# Patient Record
Sex: Female | Born: 1940 | Race: White | Hispanic: No | Marital: Married | Smoking: Never smoker
Health system: Southern US, Community
[De-identification: ages and names within clinical notes are randomized; demographics above are authoritative.]

## PROBLEM LIST (undated history)

## (undated) DIAGNOSIS — I2699 Other pulmonary embolism without acute cor pulmonale: Secondary | ICD-10-CM

## (undated) DIAGNOSIS — E119 Type 2 diabetes mellitus without complications: Secondary | ICD-10-CM

## (undated) DIAGNOSIS — F419 Anxiety disorder, unspecified: Secondary | ICD-10-CM

## (undated) DIAGNOSIS — I5032 Chronic diastolic (congestive) heart failure: Secondary | ICD-10-CM

## (undated) DIAGNOSIS — I82409 Acute embolism and thrombosis of unspecified deep veins of unspecified lower extremity: Secondary | ICD-10-CM

## (undated) DIAGNOSIS — I10 Essential (primary) hypertension: Secondary | ICD-10-CM

## (undated) DIAGNOSIS — E785 Hyperlipidemia, unspecified: Secondary | ICD-10-CM

## (undated) DIAGNOSIS — J449 Chronic obstructive pulmonary disease, unspecified: Secondary | ICD-10-CM

## (undated) HISTORY — PX: NO PAST SURGERIES: SHX2092

---

## 2006-07-03 ENCOUNTER — Ambulatory Visit: Payer: Self-pay | Admitting: *Deleted

## 2006-07-14 ENCOUNTER — Ambulatory Visit: Payer: Self-pay | Admitting: *Deleted

## 2007-01-06 ENCOUNTER — Ambulatory Visit: Payer: Self-pay | Admitting: *Deleted

## 2007-01-14 ENCOUNTER — Ambulatory Visit: Payer: Self-pay | Admitting: *Deleted

## 2007-07-06 ENCOUNTER — Ambulatory Visit: Payer: Self-pay | Admitting: *Deleted

## 2007-11-20 ENCOUNTER — Ambulatory Visit: Payer: Self-pay | Admitting: *Deleted

## 2008-02-08 ENCOUNTER — Ambulatory Visit: Payer: Self-pay | Admitting: *Deleted

## 2008-07-27 ENCOUNTER — Ambulatory Visit: Payer: Self-pay | Admitting: Family Medicine

## 2009-07-31 ENCOUNTER — Ambulatory Visit: Payer: Self-pay | Admitting: Family Medicine

## 2010-12-04 ENCOUNTER — Ambulatory Visit: Payer: Self-pay | Admitting: Family Medicine

## 2012-02-25 ENCOUNTER — Ambulatory Visit: Payer: Self-pay | Admitting: Family Medicine

## 2013-04-20 ENCOUNTER — Ambulatory Visit: Payer: Self-pay | Admitting: Family Medicine

## 2014-06-21 ENCOUNTER — Ambulatory Visit: Payer: Self-pay | Admitting: Family Medicine

## 2014-07-06 ENCOUNTER — Ambulatory Visit: Payer: Self-pay

## 2014-11-22 ENCOUNTER — Ambulatory Visit: Payer: Self-pay | Admitting: Rheumatology

## 2016-09-04 ENCOUNTER — Other Ambulatory Visit: Payer: Self-pay | Admitting: Physician Assistant

## 2016-09-04 ENCOUNTER — Ambulatory Visit
Admission: RE | Admit: 2016-09-04 | Discharge: 2016-09-04 | Disposition: A | Discharge status: Home / Self Care | Payer: Medicare Other | Source: Ambulatory Visit | Attending: Physician Assistant | Admitting: Physician Assistant

## 2016-09-04 DIAGNOSIS — R0781 Pleurodynia: Secondary | ICD-10-CM | POA: Insufficient documentation

## 2016-09-04 DIAGNOSIS — M5134 Other intervertebral disc degeneration, thoracic region: Secondary | ICD-10-CM | POA: Diagnosis not present

## 2016-09-04 DIAGNOSIS — M25551 Pain in right hip: Secondary | ICD-10-CM

## 2016-09-04 DIAGNOSIS — M25559 Pain in unspecified hip: Secondary | ICD-10-CM | POA: Diagnosis not present

## 2021-01-06 ENCOUNTER — Emergency Department: Payer: Medicare Other

## 2021-01-06 ENCOUNTER — Other Ambulatory Visit: Payer: Self-pay

## 2021-01-06 ENCOUNTER — Inpatient Hospital Stay
Admission: EM | Admit: 2021-01-06 | Discharge: 2021-01-23 | DRG: 871 | Disposition: A | Discharge status: Skilled Nursing Facility | Payer: Medicare Other | Attending: Internal Medicine | Admitting: Internal Medicine

## 2021-01-06 ENCOUNTER — Observation Stay: Payer: Medicare Other

## 2021-01-06 DIAGNOSIS — U071 COVID-19: Secondary | ICD-10-CM

## 2021-01-06 DIAGNOSIS — M797 Fibromyalgia: Secondary | ICD-10-CM | POA: Diagnosis not present

## 2021-01-06 DIAGNOSIS — A4189 Other specified sepsis: Principal | ICD-10-CM | POA: Diagnosis present

## 2021-01-06 DIAGNOSIS — E876 Hypokalemia: Secondary | ICD-10-CM | POA: Diagnosis not present

## 2021-01-06 DIAGNOSIS — J80 Acute respiratory distress syndrome: Secondary | ICD-10-CM

## 2021-01-06 DIAGNOSIS — I82451 Acute embolism and thrombosis of right peroneal vein: Secondary | ICD-10-CM | POA: Diagnosis present

## 2021-01-06 DIAGNOSIS — I10 Essential (primary) hypertension: Secondary | ICD-10-CM | POA: Diagnosis not present

## 2021-01-06 DIAGNOSIS — I2699 Other pulmonary embolism without acute cor pulmonale: Secondary | ICD-10-CM

## 2021-01-06 DIAGNOSIS — Z6836 Body mass index (BMI) 36.0-36.9, adult: Secondary | ICD-10-CM

## 2021-01-06 DIAGNOSIS — T380X5A Adverse effect of glucocorticoids and synthetic analogues, initial encounter: Secondary | ICD-10-CM | POA: Diagnosis present

## 2021-01-06 DIAGNOSIS — E8770 Fluid overload, unspecified: Secondary | ICD-10-CM | POA: Diagnosis present

## 2021-01-06 DIAGNOSIS — E782 Mixed hyperlipidemia: Secondary | ICD-10-CM

## 2021-01-06 DIAGNOSIS — E785 Hyperlipidemia, unspecified: Secondary | ICD-10-CM | POA: Diagnosis present

## 2021-01-06 DIAGNOSIS — R54 Age-related physical debility: Secondary | ICD-10-CM | POA: Diagnosis present

## 2021-01-06 DIAGNOSIS — D6959 Other secondary thrombocytopenia: Secondary | ICD-10-CM | POA: Diagnosis present

## 2021-01-06 DIAGNOSIS — Z20822 Contact with and (suspected) exposure to covid-19: Secondary | ICD-10-CM | POA: Diagnosis present

## 2021-01-06 DIAGNOSIS — Z28310 Unvaccinated for covid-19: Secondary | ICD-10-CM

## 2021-01-06 DIAGNOSIS — E041 Nontoxic single thyroid nodule: Secondary | ICD-10-CM | POA: Diagnosis present

## 2021-01-06 DIAGNOSIS — Z0184 Encounter for antibody response examination: Secondary | ICD-10-CM

## 2021-01-06 DIAGNOSIS — I2694 Multiple subsegmental pulmonary emboli without acute cor pulmonale: Secondary | ICD-10-CM | POA: Diagnosis present

## 2021-01-06 DIAGNOSIS — I82431 Acute embolism and thrombosis of right popliteal vein: Secondary | ICD-10-CM | POA: Diagnosis present

## 2021-01-06 DIAGNOSIS — B370 Candidal stomatitis: Secondary | ICD-10-CM | POA: Diagnosis not present

## 2021-01-06 DIAGNOSIS — G8929 Other chronic pain: Secondary | ICD-10-CM | POA: Diagnosis present

## 2021-01-06 DIAGNOSIS — J9601 Acute respiratory failure with hypoxia: Secondary | ICD-10-CM

## 2021-01-06 DIAGNOSIS — R0602 Shortness of breath: Secondary | ICD-10-CM | POA: Diagnosis not present

## 2021-01-06 DIAGNOSIS — A419 Sepsis, unspecified organism: Secondary | ICD-10-CM | POA: Diagnosis present

## 2021-01-06 DIAGNOSIS — E441 Mild protein-calorie malnutrition: Secondary | ICD-10-CM | POA: Diagnosis present

## 2021-01-06 DIAGNOSIS — J189 Pneumonia, unspecified organism: Secondary | ICD-10-CM

## 2021-01-06 DIAGNOSIS — R6520 Severe sepsis without septic shock: Secondary | ICD-10-CM | POA: Diagnosis present

## 2021-01-06 DIAGNOSIS — I248 Other forms of acute ischemic heart disease: Secondary | ICD-10-CM | POA: Diagnosis present

## 2021-01-06 DIAGNOSIS — Z79899 Other long term (current) drug therapy: Secondary | ICD-10-CM

## 2021-01-06 DIAGNOSIS — J811 Chronic pulmonary edema: Secondary | ICD-10-CM | POA: Diagnosis present

## 2021-01-06 DIAGNOSIS — R918 Other nonspecific abnormal finding of lung field: Secondary | ICD-10-CM

## 2021-01-06 DIAGNOSIS — R5381 Other malaise: Secondary | ICD-10-CM | POA: Diagnosis present

## 2021-01-06 LAB — LACTIC ACID, PLASMA
Lactic Acid, Venous: 1.9 mmol/L (ref 0.5–1.9)
Lactic Acid, Venous: 2.2 mmol/L (ref 0.5–1.9)
Lactic Acid, Venous: 1.8 mmol/L (ref 0.5–1.9)

## 2021-01-06 LAB — PROTIME-INR
INR: 1.6 — ABNORMAL HIGH (ref 0.8–1.2)
Prothrombin Time: 18.6 seconds — ABNORMAL HIGH (ref 11.4–15.2)

## 2021-01-06 LAB — BASIC METABOLIC PANEL
Anion gap: 11 (ref 5–15)
BUN: 20 mg/dL (ref 8–23)
CO2: 25 mmol/L (ref 22–32)
Calcium: 7.9 mg/dL — ABNORMAL LOW (ref 8.9–10.3)
Chloride: 103 mmol/L (ref 98–111)
Creatinine, Ser: 0.89 mg/dL (ref 0.44–1.00)
GFR, Estimated: >60 mL/min (ref >60)
Glucose, Bld: 139 mg/dL — ABNORMAL HIGH (ref 70–99)
Potassium: 4 mmol/L (ref 3.5–5.1)
Sodium: 139 mmol/L (ref 135–145)

## 2021-01-06 LAB — TROPONIN I (HIGH SENSITIVITY)
Troponin I (High Sensitivity): 19 ng/L — ABNORMAL HIGH (ref <18)
Troponin I (High Sensitivity): 20 ng/L — ABNORMAL HIGH (ref <18)

## 2021-01-06 LAB — CBC WITH DIFFERENTIAL/PLATELET
Abs Immature Granulocytes: 0.58 10*3/uL — ABNORMAL HIGH (ref 0.00–0.07)
Basophils Absolute: 0.1 10*3/uL (ref 0.0–0.1)
Basophils Relative: 0 %
Eosinophils Absolute: 0.1 10*3/uL (ref 0.0–0.5)
Eosinophils Relative: 1 %
HCT: 49.8 % — ABNORMAL HIGH (ref 36.0–46.0)
Hemoglobin: 15.6 g/dL — ABNORMAL HIGH (ref 12.0–15.0)
Immature Granulocytes: 3 %
Lymphocytes Relative: 13 %
Lymphs Abs: 2.2 10*3/uL (ref 0.7–4.0)
MCH: 29.8 pg (ref 26.0–34.0)
MCHC: 31.3 g/dL (ref 30.0–36.0)
MCV: 95 fL (ref 80.0–100.0)
Monocytes Absolute: 4.4 10*3/uL — ABNORMAL HIGH (ref 0.1–1.0)
Monocytes Relative: 26 %
Neutro Abs: 9.9 10*3/uL — ABNORMAL HIGH (ref 1.7–7.7)
Neutrophils Relative %: 57 %
Platelets: 123 10*3/uL — ABNORMAL LOW (ref 150–400)
RBC: 5.24 MIL/uL — ABNORMAL HIGH (ref 3.87–5.11)
RDW: 18.9 % — ABNORMAL HIGH (ref 11.5–15.5)
Smear Review: NORMAL
WBC: 17.3 10*3/uL — ABNORMAL HIGH (ref 4.0–10.5)
nRBC: 0.1 % (ref 0.0–0.2)

## 2021-01-06 LAB — RESP PANEL BY RT-PCR (FLU A&B, COVID) ARPGX2
Influenza A by PCR: NEGATIVE
Influenza B by PCR: NEGATIVE
SARS Coronavirus 2 by RT PCR: NEGATIVE

## 2021-01-06 MED ORDER — ACETAMINOPHEN 325 MG PO TABS
650.0000 mg | ORAL_TABLET | Freq: Four times a day (QID) | ORAL | Status: DC | PRN
  Administered 2021-01-08 – 2021-01-18 (×4): 650 mg via ORAL
  Filled 2021-01-06 (×5): qty 2

## 2021-01-06 MED ORDER — TRAMADOL HCL 50 MG PO TABS
50.0000 mg | ORAL_TABLET | Freq: Three times a day (TID) | ORAL | Status: AC | PRN
  Administered 2021-01-07 – 2021-01-08 (×3): 50 mg via ORAL
  Filled 2021-01-06 (×3): qty 1

## 2021-01-06 MED ORDER — ENOXAPARIN SODIUM 60 MG/0.6ML ~~LOC~~ SOLN
0.5000 mg/kg | SUBCUTANEOUS | Status: DC
  Administered 2021-01-06: 50 mg via SUBCUTANEOUS

## 2021-01-06 MED ORDER — HEPARIN (PORCINE) 25000 UT/250ML-% IV SOLN: 1400.0000 [IU]/h | INTRAVENOUS | Status: DC

## 2021-01-06 MED ORDER — HEPARIN (PORCINE) 25000 UT/250ML-% IV SOLN
1450.0000 [IU]/h | INTRAVENOUS | Status: DC
  Administered 2021-01-07: 1400 [IU]/h via INTRAVENOUS
  Administered 2021-01-08: 1550 [IU]/h via INTRAVENOUS
  Administered 2021-01-09: 1450 [IU]/h via INTRAVENOUS
  Filled 2021-01-06 (×4): qty 250

## 2021-01-06 MED ORDER — IOHEXOL 350 MG/ML SOLN
75.0000 mL | Freq: Once | INTRAVENOUS | Status: AC | PRN
  Administered 2021-01-06: 75 mL via INTRAVENOUS

## 2021-01-06 MED ORDER — ENOXAPARIN SODIUM 60 MG/0.6ML ~~LOC~~ SOLN: 50.0000 mg | SUBCUTANEOUS | Status: DC

## 2021-01-06 MED ORDER — SODIUM CHLORIDE 0.9 % IV SOLN: INTRAVENOUS | Status: DC

## 2021-01-06 MED ORDER — GABAPENTIN 300 MG PO CAPS
300.0000 mg | ORAL_CAPSULE | Freq: Three times a day (TID) | ORAL | Status: DC
  Administered 2021-01-06 – 2021-01-17 (×33): 300 mg via ORAL
  Filled 2021-01-06 (×33): qty 1

## 2021-01-06 MED ORDER — ONDANSETRON HCL 4 MG/2ML IJ SOLN: 4.0000 mg | Freq: Four times a day (QID) | INTRAMUSCULAR | Status: DC | PRN

## 2021-01-06 MED ORDER — HYDRALAZINE HCL 25 MG PO TABS: 25.0000 mg | ORAL_TABLET | Freq: Three times a day (TID) | ORAL | Status: AC | PRN

## 2021-01-06 MED ORDER — SODIUM CHLORIDE 0.9 % IV SOLN
500.0000 mg | INTRAVENOUS | Status: AC
  Administered 2021-01-07 – 2021-01-12 (×6): 500 mg via INTRAVENOUS
  Filled 2021-01-06 (×7): qty 500

## 2021-01-06 MED ORDER — SODIUM CHLORIDE 0.9 % IV SOLN
2.0000 g | INTRAVENOUS | Status: DC
  Filled 2021-01-06: qty 20

## 2021-01-06 MED ORDER — SODIUM CHLORIDE 0.9 % IV SOLN
500.0000 mg | Freq: Once | INTRAVENOUS | Status: AC
  Administered 2021-01-06: 500 mg via INTRAVENOUS
  Filled 2021-01-06: qty 500

## 2021-01-06 MED ORDER — ONDANSETRON HCL 4 MG PO TABS: 4.0000 mg | ORAL_TABLET | Freq: Four times a day (QID) | ORAL | Status: DC | PRN

## 2021-01-06 MED ORDER — ACETAMINOPHEN 650 MG RE SUPP
650.0000 mg | Freq: Four times a day (QID) | RECTAL | Status: DC | PRN
Start: 2021-01-06 — End: 2021-01-23

## 2021-01-06 MED ORDER — SODIUM CHLORIDE 0.9 % IV SOLN
2.0000 g | Freq: Once | INTRAVENOUS | Status: AC
  Administered 2021-01-06: 2 g via INTRAVENOUS
  Filled 2021-01-06: qty 20

## 2021-01-06 MED ORDER — PRAVASTATIN SODIUM 20 MG PO TABS
10.0000 mg | ORAL_TABLET | Freq: Every day | ORAL | Status: DC
  Administered 2021-01-07 – 2021-01-23 (×17): 10 mg via ORAL
  Filled 2021-01-06 (×17): qty 1

## 2021-01-06 NOTE — ED Provider Notes (Signed)
-----------------------------------------   4:35 PM on 01/06/2021 -----------------------------------------  I received verbal report from radiology that the patient has bilateral segmental and subsegmental PE.  I informed the hospitalist Dr. Sedalia Muta who is taking care of the patient via secure chat.   Dionne Bucy, MD 01/06/21 [phone]

## 2021-01-06 NOTE — H&P (Signed)
History and Physical   Elizabeth Rowland JKK:[id number] DOB: 1941/05/22 DOA: 01/06/2021  PCP: Leanna Sato, MD  Outpatient Specialists: Dr. Gavin Potters, rheumatology Patient coming from: Home  I have personally briefly reviewed patient's old medical records in [facility] EMR.  Chief Concern: Shortness of breath  HPI: Elizabeth Rowland is a 80 y.o. female with medical history significant for fibromyalgia, history of hypertension, history of hyperlipidemia, currently on tramadol and gabapentin for chronic pain and fibromyalgia, no longer taking antihypertensives for pravastatin, presents to the emergency department for chief concerns of shortness of breath.  She reports that she has been having worsening shortness of breath for 3 weeks, cough that is productive of light yellow phlegm. She denies fever, vomtiing, chest pain, abdominal pain, diarrhea. She endorses dysuria 3 weeks ago.  She denies sick contacts.  At bedside, patient was able to tell me her name, age, location of hospital, and the current calendar year is 2022.  Patient was on 2 L nasal cannula at bedside and satting at 83 to 87%.  I increased it to 4 L and patient continued to sat at 85%.  I increased it to 10 L with some improvement.  Social history: lives with son. She denies tobacco, etoh, and recreational drug use. Formerly, she used to work in a [employer].  Vaccinations: she is not vaccinated for covid 19   ROS: Constitutional: no weight change, no fever ENT/Mouth: no sore throat, no rhinorrhea Eyes: no eye pain, no vision changes Cardiovascular: no chest pain, + dyspnea,  no edema, no palpitations Respiratory: + cough, + sputum, no wheezing Gastrointestinal: no nausea, no vomiting, no diarrhea, no constipation Genitourinary: no urinary incontinence, no dysuria, no hematuria Musculoskeletal: no arthralgias, no myalgias Skin: no skin lesions, no pruritus, Neuro: + weakness, no loss of consciousness, no  syncope Psych: no anxiety, no depression, + decrease appetite Heme/Lymph: no bruising, no bleeding  ED Course: Discussed with ED provider, patient requiring hospitalization for pneumonia.  Vitals in the emergency department was remarkable for temperature of 98.1, respiration rate 22, heart rate 89, blood pressure 167/89, SPO2 of 93% on 10 L nasal cannula.  Labs in the emergency department was remarkable for WBC of 17.3, hemoglobin 15.6, platelets 123, sodium 139, potassium 4.0, chloride 103, bicarb 25, BUN 20, serum creatinine of 0.89, nonfasting blood glucose 139.  Troponin is initially 19 and increased to 20.  COVID is negative.  Status post azithromycin and ceftriaxone IV per ED provider.  Assessment/Plan  Active Problems:   Severe sepsis with acute organ dysfunction (HCC)   Hyperlipidemia   Fibromyalgia   Essential hypertension   # Acute hypoxic respiratory failure secondary to pneumonia # Meets severe sepsis criteria Bilateral upper lobe pulmonary embolism - Increased respiration rate, hypoxia, leukocytosis, pneumonia - RT communication to place patient on nonrebreather - Patient is maintaining MAP, no indication for IV sepsis bolus at this time - Serum creatinine is 0.89, encourage p.o. intake - Status post azithromycin and ceftriaxone per EDP - Heparin per pharmacy, echo to assess heart function, bilateral US to assess for DVT, vascular surgeon consulted, Dr. Keitha Butte via secure chat and he will see the patient.   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   DVT prophylaxis: Enoxaparin 50 mg subcutaneous, every 24 hours Code Status: Full code Diet:/Carb modified Family Communication: Updated son, Mr. Elizabeth Rowland at bedside Disposition Plan: pending clinical course Consults called: None at this time Admission status: Observation, progressive cardiac, with telemetry  History reviewed. No pertinent past medical history.  History reviewed. No pertinent surgical history.  Social History:  reports that she has never smoked. She has never used smokeless tobacco. She reports that she does not drink alcohol and does not use drugs.  Not on File History reviewed. No pertinent family history. Family history: Family history reviewed and not pertinent  Prior to Admission medications   Not on File   Physical Exam: Vitals:   01/06/21 1304 01/06/21 1401 01/06/21 1432 01/06/21 1457  BP: 135/85 (!) 175/89 (!) 170/87 (!) 167/89  Pulse: 94 92 78 90  Resp: 17 (!) 30 (!) 30 (!) 27  Temp: 98.1 F (36.7 C)     TempSrc: Oral     SpO2: 91% 99% 96% 93%  Weight:      Height:       Constitutional: appears age-appropriate, poor personal hygiene, NAD, calm, comfortable Eyes: PERRL, lids and conjunctivae normal ENMT: Mucous membranes are dry. Posterior pharynx clear of any exudate or lesions.  Poor dentition.  Mild hearing loss Neck: normal, supple, no masses, no thyromegaly Respiratory: clear to auscultation bilaterally, no wheezing, no crackles. Normal respiratory effort. No accessory muscle use.  Cardiovascular: Regular rate and rhythm, no murmurs / rubs / gallops. No extremity edema. 2+ pedal pulses. No carotid bruits.  Abdomen: no tenderness, no masses palpated, no  hepatosplenomegaly. Bowel sounds positive.  Musculoskeletal: no clubbing / cyanosis. No joint deformity upper and lower extremities. Good ROM, no contractures, no atrophy. Normal muscle tone.  Skin: no rashes, lesions, ulcers. No induration Neurologic: Sensation intact. Strength 5/5 in all 4.  Psychiatric: Normal judgment and insight. Alert and oriented x 3. Normal mood.   EKG: independently reviewed, showing sinus rhythm rate of 92, qtc 485  Chest x-ray on Admission: I personally reviewed and I agree with radiologist reading as below.  DG Chest Portable 1 View  Result Date: 01/06/2021 CLINICAL DATA:  Shortness of breath EXAM: PORTABLE CHEST 1 VIEW COMPARISON:  07/06/2014 FINDINGS: Heart size appears within normal limits. Rounded mass-like opacity within the right mid to lower lung field measuring approximately 6.4 cm in diameter. Diffuse interstitial opacities throughout both lungs. Small right pleural effusion. No pneumothorax. IMPRESSION: 1. Rounded mass-like opacity within the right mid to lower lung field measuring approximately 6.4 cm in diameter. Further evaluation with contrast-enhanced CT of the chest is recommended. 2. Extensive bilateral interstitial opacities which may reflect pulmonary edema or possibly atypical/viral infection. 3. Small right pleural effusion. Electronically Signed   By: Duanne Guess D.O.   On: 01/06/2021 14:12   Labs on Admission: I have personally reviewed following labs  CBC: Recent Labs  Lab 01/06/21 1305  WBC 17.3*  NEUTROABS 9.9*  HGB 15.6*  HCT 49.8*  MCV 95.0  PLT 123*   Basic Metabolic Panel: Recent Labs  Lab 01/06/21 1305  NA  139  K 4.0  CL 103  CO2 25  GLUCOSE 139*  BUN 20  CREATININE 0.89  CALCIUM 7.9*   GFR: Estimated Creatinine Clearance: 60.3 mL/min (by C-G formula based on SCr of 0.89 mg/dL).  CRITICAL CARE Performed by: Lovenia Kim  Total critical care time: 35 minutes  Critical care time was exclusive of separately  billable procedures and treating other patients.  Critical care was necessary to treat or prevent imminent or life-threatening deterioration. Hypoxic respiratory failure. Circulatory failure secondary to pulmonary embolism. Sepsis  Critical care was time spent personally by me on the following activities: development of treatment plan with patient and/or surrogate as well as nursing, discussions with consultants, evaluation of patient's response to treatment, examination of patient, obtaining history from patient or surrogate, ordering and performing treatments and interventions, ordering and review of laboratory studies, ordering and review of radiographic studies, pulse oximetry and re-evaluation of patient's condition.  Jonia Oakey N Artha Stavros D.O. [facility]  If 7PM-7AM, please contact overnight-coverage provider If 7AM-7PM, please contact day coverage provider [email or web address]  01/06/2021, 4:17 PM

## 2021-01-06 NOTE — Progress Notes (Signed)
Lab called to report Critical Lactic Value 2.2  MD Cox and Keitha Butte notified via secure chat.

## 2021-01-06 NOTE — ED Provider Notes (Signed)
[facility] Emergency Department Provider Note  ____________________________________________  Time seen: Approximately 3:07 PM  I have reviewed the triage vital signs and the nursing notes.   HISTORY  Chief Complaint Shortness of Breath (Loss of taiste x 2 weeks, sob, pt from home )    HPI Elizabeth Rowland is a 80 y.o. female with a past history of hyperlipidemia, GERD, hypertension, obesity  who comes the ED complaining of shortness of breath for the past [redacted] weeks along with loss of taste.  Denies chest pain.  Shortness of breath has been persistent, worse with walking.  No alleviating factors.  Also has a productive cough.  Symptoms are constant.  No vomiting or diarrhea.  Decreased oral intake but overall feels like she is eating and drinking adequately.    Medications Reconcile with Patient's Chart  Medication Sig Dispensed Refills Start Date End Date Status  acetaminophen (TYLENOL) 500 mg capsule  Take 500 mg by mouth every 4 (four) hours as needed for Fever.  0   Active  naproxen (NAPROSYN) 500 MG tablet  Take 500 mg by mouth 2 (two) times daily with meals.  0   Active  pravastatin (PRAVACHOL) 10 MG tablet  Take 10 mg by mouth nightly.  0   Active  omeprazole (PRILOSEC) 20 MG DR capsule  Take 20 mg by mouth once daily.  0   Active  lisinopril-hydrochlorothiazide (PRINZIDE,ZESTORETIC) 20-25 mg tablet  Take 1 tablet by mouth once daily.  0   Active  nystatin (MYCOSTATIN) 100,000 unit/gram cream  Apply topically 2 (two) times daily.  0   Active  aspirin 81 MG EC tablet  Take 81 mg by mouth once daily.  0   Active  methocarbamol (ROBAXIN) 500 MG tablet  TAKE ONE TABLET BY MOUTH FOUR TIMES DAILY 40 tablet  2 11/29/2014  Active  traMADol (ULTRAM) 50 mg tablet  TAKE ONE TABLET BY MOUTH TWICE DAILY AS NEEDED FOR PAIN 60 tablet  0 01/18/2015  Active   Active Problems   Not on file  Medical History   Medical History Date  Comments  Fibromyalgia    Chronic pain    Psoriasis    Hyperlipemia    Anxiety    Fibromyalgia    Arthritis    GERD (gastroesophageal reflux disease)    Allergic state    Hypertension    Obesity      History reviewed. No pertinent past medical history.   There are no problems to display for this patient.    History reviewed. No pertinent surgical history.     Allergies Patient has no allergy information on record.   History reviewed. No pertinent family history.  Social History Social History   Tobacco Use  . Smoking status: Never Smoker  . Smokeless tobacco: Never Used  Substance Use Topics  . Alcohol use: Never  . Drug use: Never    Review of Systems  Constitutional:   No fever or chills.  ENT:   No sore throat. No rhinorrhea. Cardiovascular:   No chest pain or syncope. Respiratory: Positive shortness of breath and productive cough. Gastrointestinal:   Negative for abdominal pain, vomiting and diarrhea.  Musculoskeletal:   Negative for focal pain or swelling All other systems reviewed and are negative except as documented above in ROS and HPI.  ____________________________________________   PHYSICAL EXAM:  VITAL SIGNS: ED Triage Vitals  Enc Vitals Group     BP 01/06/21 1304 135/85     Pulse Rate  01/06/21 1304 94     Resp 01/06/21 1304 17     Temp 01/06/21 1304 98.1 F (36.7 C)     Temp Source 01/06/21 1304 Oral     SpO2 01/06/21 1304 91 %     Weight 01/06/21 1301 220 lb 7.4 oz (100 kg)     Height 01/06/21 1301 5\' 7"  (1.702 m)     Head Circumference --      Peak Flow --      Pain Score 01/06/21 1301 0     Pain Loc --      Pain Edu? --      Excl. in GC? --     Vital signs reviewed, nursing assessments reviewed.   Constitutional:   Alert and oriented. Non-toxic appearance. Eyes:   Conjunctivae are normal. EOMI. PERRL. ENT      Head:   Normocephalic and atraumatic.      Nose:   Wearing a mask.      Mouth/Throat:    Wearing a mask.      Neck:   No meningismus. Full ROM. Hematological/Lymphatic/Immunilogical:   No cervical lymphadenopathy. Cardiovascular:   RRR. Symmetric bilateral radial and DP pulses.  No murmurs. Cap refill less than 2 seconds. Respiratory:   Tachypnea with a respiratory rate of about 28.  Bilateral basilar crackles, diminished breath sounds on the right. Gastrointestinal:   Soft and nontender. Non distended. There is no CVA tenderness.  No rebound, rigidity, or guarding. Genitourinary:   deferred Musculoskeletal:   Normal range of motion in all extremities. No joint effusions.  No lower extremity tenderness.  No edema. Neurologic:   Normal speech and language.  Motor grossly intact. No acute focal neurologic deficits are appreciated.  Skin:    Skin is warm, dry and intact. No rash noted.  No petechiae, purpura, or bullae.  ____________________________________________    LABS (pertinent positives/negatives) (all labs ordered are listed, but only abnormal results are displayed) Labs Reviewed  BASIC METABOLIC PANEL - Abnormal; Notable for the following components:      Result Value   Glucose, Bld 139 (*)    Calcium 7.9 (*)    All other components within normal limits  CBC WITH DIFFERENTIAL/PLATELET - Abnormal; Notable for the following components:   WBC 17.3 (*)    RBC 5.24 (*)    Hemoglobin 15.6 (*)    HCT 49.8 (*)    RDW 18.9 (*)    Platelets 123 (*)    Neutro Abs 9.9 (*)    Monocytes Absolute 4.4 (*)    Abs Immature Granulocytes 0.58 (*)    All other components within normal limits  TROPONIN I (HIGH SENSITIVITY) - Abnormal; Notable for the following components:   Troponin I (High Sensitivity) 19 (*)    All other components within normal limits  RESP PANEL BY RT-PCR (FLU A&B, COVID) ARPGX2  CULTURE, BLOOD (ROUTINE X 2)  CULTURE, BLOOD (ROUTINE X 2)  LACTIC ACID, PLASMA  LACTIC ACID, PLASMA  POC SARS CORONAVIRUS 2 AG -  ED  TROPONIN I (HIGH SENSITIVITY)    ____________________________________________   EKG  Interpreted by me Sinus rhythm rate of 92, normal axis and intervals.  Normal QRS ST segments and T waves.  No acute ischemic changes.  ____________________________________________    RADIOLOGY  DG Chest Portable 1 View  Result Date: 01/06/2021 CLINICAL DATA:  Shortness of breath EXAM: PORTABLE CHEST 1 VIEW COMPARISON:  07/06/2014 FINDINGS: Heart size appears within normal limits. Rounded mass-like opacity within the  right mid to lower lung field measuring approximately 6.4 cm in diameter. Diffuse interstitial opacities throughout both lungs. Small right pleural effusion. No pneumothorax. IMPRESSION: 1. Rounded mass-like opacity within the right mid to lower lung field measuring approximately 6.4 cm in diameter. Further evaluation with contrast-enhanced CT of the chest is recommended. 2. Extensive bilateral interstitial opacities which may reflect pulmonary edema or possibly atypical/viral infection. 3. Small right pleural effusion. Electronically Signed   By: Duanne Guess D.O.   On: 01/06/2021 14:12    ____________________________________________   PROCEDURES Procedures  ____________________________________________  DIFFERENTIAL DIAGNOSIS   Pneumonia, viral illness, pleural effusion, pulmonary edema, anemia, dehydration, pulmonary embolism  CLINICAL IMPRESSION / ASSESSMENT AND PLAN / ED COURSE  Medications ordered in the ED: Medications  cefTRIAXone (ROCEPHIN) 2 g in sodium chloride 0.9 % 100 mL IVPB (has no administration in time range)  azithromycin (ZITHROMAX) 500 mg in sodium chloride 0.9 % 250 mL IVPB (has no administration in time range)  iohexol (OMNIPAQUE) 350 MG/ML injection 75 mL (has no administration in time range)    Pertinent labs & imaging results that were available during my care of the patient were reviewed by me and considered in my medical decision making (see chart for details).  Elizabeth Rothschild  Rowland was evaluated in Emergency Department on 01/06/2021 for the symptoms described in the history of present illness. She was evaluated in the context of the global COVID-19 pandemic, which necessitated consideration that the patient might be at risk for infection with the SARS-CoV-2 virus that causes COVID-19. Institutional protocols and algorithms that pertain to the evaluation of patients at risk for COVID-19 are in a state of rapid change based on information released by regulatory bodies including the CDC and federal and state organizations. These policies and algorithms were followed during the patient's care in the ED.   Patient presents with shortness of breath and cough.  Chest x-ray viewed and interpreted by me, concerning for a lung mass as well as possible pneumonia.  Radiology report confirms.  Hypoxic to 87% on room air.  Placed on nonrebreather for comfort on arrival, will wean to nasal cannula as able..  Will obtain CTA chest to evaluate for PE and further delineate possible mass in the chest.  We will also obtain abdomen pelvis CT for possible metastatic disease.  Plan to admit for hypoxic respiratory failure  COVID/flu negative     ____________________________________________   FINAL CLINICAL IMPRESSION(S) / ED DIAGNOSES    Final diagnoses:  Acute respiratory failure with hypoxia (HCC)  Community acquired pneumonia, unspecified laterality  Lung mass     ED Discharge Orders    None      Portions of this note were generated with dragon dictation software. Dictation errors may occur despite best attempts at proofreading.   Sharman Cheek, MD 01/06/21 [phone]

## 2021-01-06 NOTE — Progress Notes (Signed)
PHARMACIST - PHYSICIAN COMMUNICATION  CONCERNING:  Enoxaparin (Lovenox) for DVT Prophylaxis    RECOMMENDATION: Patient was prescribed enoxaprin 40mg  q24 hours for VTE prophylaxis.   Filed Weights   01/06/21 1301  Weight: 100 kg (220 lb 7.4 oz)    Body mass index is 34.53 kg/m.  Estimated Creatinine Clearance: 60.3 mL/min (by C-G formula based on SCr of 0.89 mg/dL).   Based on [facility] policy patient is candidate for enoxaparin 0.5mg /kg TBW SQ every 24 hours based on BMI being >30.  DESCRIPTION: Pharmacy has adjusted enoxaparin dose per [facility] policy.  Patient is now receiving enoxaparin 50 mg every 24 hours    [facility], PharmD Pharmacy Resident  01/06/2021 3:31 PM

## 2021-01-06 NOTE — Consult Note (Signed)
Reason for Consult: Bilateral pulmonary emboli Referring Physician: Arraya Buck is an 80 y.o. female.  HPI: 80 year old woman who is admitted with bilateral pneumonia.  She had progressive shortness of breath over the past 3 to 4 weeks according to her family.  Patient in the emergency room had sats of 82 to 86% on 4 to 6 L currently on the floor she is 100% on 100% nonrebreather.  She is comfortable and not tachypneic.  The patient denies any chest pain.  Patient did not notify her family members of her worsening shortness of breath till recently.  Unsure whether this occurred suddenly or worsened over time.  CT scan was performed in the ER showing a small PE involving the right apical segment of the superior trunk on the right pulmonary artery, and to subsegmental PEs in the left upper lobe anterior segmental branch.  Diffuse groundglass opacities are seen throughout bilateral lungs consistent with pneumonia/pulmonary edema.  Has been admitted and started on Rocephin and azithromycin.  She ran.  She had primary care physician previously but none in the past 8 years.  No home medications for her dyslipidemia and hypertension at this time.  Echocardiogram and lower extremity DVT ultrasound are still pending.  Patient was previously ambulatory in the home, outside the home minimally because of balance issues.  Is seen in a pain clinic for her chronic fibromyalgia issues and is on chronic pain medications with tramadol and gabapentin.  Past medical history: Hypertension, dyslipidemia, fibromyalgia  History reviewed. No pertinent surgical history.  History reviewed. No pertinent family history.  Social History:  reports that she has never smoked. She has never used smokeless tobacco. She reports that she does not drink alcohol and does not use drugs.  Allergies: Not on File  Medications: I have reviewed the patient's current medications.  Results for orders placed or performed during  the hospital encounter of 01/06/21 (from the past 48 hour(s))  Resp Panel by RT-PCR (Flu A&B, Covid) Nasopharyngeal Swab     Status: None   Collection Time: 01/06/21  1:05 PM   Specimen: Nasopharyngeal Swab; Nasopharyngeal(NP) swabs in vial transport medium  Result Value Ref Range   SARS Coronavirus 2 by RT PCR NEGATIVE NEGATIVE    Comment: (NOTE) SARS-CoV-2 target nucleic acids are NOT DETECTED.  The SARS-CoV-2 RNA is generally detectable in upper respiratory specimens during the acute phase of infection. The lowest concentration of SARS-CoV-2 viral copies this assay can detect is 138 copies/mL. A negative result does not preclude SARS-Cov-2 infection and should not be used as the sole basis for treatment or other patient management decisions. A negative result may occur with  improper specimen collection/handling, submission of specimen other than nasopharyngeal swab, presence of viral mutation(s) within the areas targeted by this assay, and inadequate number of viral copies(<138 copies/mL). A negative result must be combined with clinical observations, patient history, and epidemiological information. The expected result is Negative.  Fact Sheet for Patients:  BloggerCourse.com  Fact Sheet for Healthcare Providers:  SeriousBroker.it  This test is no t yet approved or cleared by the [place] FDA and  has been authorized for detection and/or diagnosis of SARS-CoV-2 by FDA under an Emergency Use Authorization (EUA). This EUA will remain  in effect (meaning this test can be used) for the duration of the COVID-19 declaration under Section 564(b)(1) of the Act, 21 U.S.C.section 360bbb-3(b)(1), unless the authorization is terminated  or revoked sooner.       Influenza  A by PCR NEGATIVE NEGATIVE   Influenza B by PCR NEGATIVE NEGATIVE    Comment: (NOTE) The Xpert Xpress SARS-CoV-2/FLU/RSV plus assay is intended as an aid in the  diagnosis of influenza from Nasopharyngeal swab specimens and should not be used as a sole basis for treatment. Nasal washings and aspirates are unacceptable for Xpert Xpress SARS-CoV-2/FLU/RSV testing.  Fact Sheet for Patients: BloggerCourse.com  Fact Sheet for Healthcare Providers: SeriousBroker.it  This test is not yet approved or cleared by the [place] FDA and has been authorized for detection and/or diagnosis of SARS-CoV-2 by FDA under an Emergency Use Authorization (EUA). This EUA will remain in effect (meaning this test can be used) for the duration of the COVID-19 declaration under Section 564(b)(1) of the Act, 21 U.S.C. section 360bbb-3(b)(1), unless the authorization is terminated or revoked.  Performed at [facility], [address] [place], [place] [address]   Basic metabolic panel     Status: Abnormal   Collection Time: 01/06/21  1:05 PM  Result Value Ref Range   Sodium 139 135 - 145 mmol/L   Potassium 4.0 3.5 - 5.1 mmol/L   Chloride 103 98 - 111 mmol/L   CO2 25 22 - 32 mmol/L   Glucose, Bld 139 (H) 70 - 99 mg/dL    Comment: Glucose reference range applies only to samples taken after fasting for at least 8 hours.   BUN 20 8 - 23 mg/dL   Creatinine, Ser 8.31 0.44 - 1.00 mg/dL   Calcium 7.9 (L) 8.9 - 10.3 mg/dL   GFR, Estimated >51 >76 mL/min    Comment: (NOTE) Calculated using the CKD-EPI Creatinine Equation (2021)    Anion gap 11 5 - 15    Comment: Performed at [facility], [address] [place], [place] [address]  Troponin I (High Sensitivity)     Status: Abnormal   Collection Time: 01/06/21  1:05 PM  Result Value Ref Range   Troponin I (High Sensitivity) 19 (H) <18 ng/L    Comment: (NOTE) Elevated high sensitivity troponin I (hsTnI) values and significant  changes across serial measurements may suggest ACS but many other  chronic and acute conditions are known to elevate hsTnI  results.  Refer to the "Links" section for chest pain algorithms and additional  guidance. Performed at [facility], [address] [place], [place] [address]   CBC with Differential     Status: Abnormal   Collection Time: 01/06/21  1:05 PM  Result Value Ref Range   WBC 17.3 (H) 4.0 - 10.5 K/uL   RBC 5.24 (H) 3.87 - 5.11 MIL/uL   Hemoglobin 15.6 (H) 12.0 - 15.0 g/dL   HCT 69.4 (H) 85.4 - 62.7 %   MCV 95.0 80.0 - 100.0 fL   MCH 29.8 26.0 - 34.0 pg   MCHC 31.3 30.0 - 36.0 g/dL   RDW 03.5 (H) 00.9 - 38.1 %   Platelets 123 (L) 150 - 400 K/uL   nRBC 0.1 0.0 - 0.2 %   Neutrophils Relative % 57 %   Neutro Abs 9.9 (H) 1.7 - 7.7 K/uL   Lymphocytes Relative 13 %   Lymphs Abs 2.2 0.7 - 4.0 K/uL   Monocytes Relative 26 %   Monocytes Absolute 4.4 (H) 0.1 - 1.0 K/uL   Eosinophils Relative 1 %   Eosinophils Absolute 0.1 0.0 - 0.5 K/uL   Basophils Relative 0 %   Basophils Absolute 0.1 0.0 - 0.1 K/uL   WBC Morphology MORPHOLOGY  UNREMARKABLE    Smear Review Normal platelet morphology    Immature Granulocytes 3 %   Abs Immature Granulocytes 0.58 (H) 0.00 - 0.07 K/uL   Polychromasia PRESENT     Comment: Performed at [facility], [address] [place], [place] [address]  Troponin I (High Sensitivity)     Status: Abnormal   Collection Time: 01/06/21  1:05 PM  Result Value Ref Range   Troponin I (High Sensitivity) 20 (H) <18 ng/L    Comment: (NOTE) Elevated high sensitivity troponin I (hsTnI) values and significant  changes across serial measurements may suggest ACS but many other  chronic and acute conditions are known to elevate hsTnI results.  Refer to the "Links" section for chest pain algorithms and additional  guidance. Performed at [facility], [address] [place], [place] [address]   Lactic acid, plasma     Status: None   Collection Time: 01/06/21  1:05 PM  Result Value Ref Range   Lactic Acid, Venous 1.9 0.5 - 1.9 mmol/L    Comment:  Performed at [facility], [address] [place], [place] [address]  Protime-INR     Status: Abnormal   Collection Time: 01/06/21  4:44 PM  Result Value Ref Range   Prothrombin Time 18.6 (H) 11.4 - 15.2 seconds   INR 1.6 (H) 0.8 - 1.2    Comment: (NOTE) INR goal varies based on device and disease states. Performed at [facility], [address] [place], [place] [address]     CT Angio Chest PE W and/or Wo Contrast  Addendum Date: 01/06/2021   ADDENDUM REPORT: 01/06/2021 16:38 ADDENDUM: Of note, in the CTA CHEST FINDINGS section, under the Cardiovascular subsection, the original report incorrectly states that there is no pericardial effusion. This section should read that there is a small pericardial effusion. The right report is otherwise unchanged. Electronically Signed   By: Romona Curlsyler  Litton M.D.   On: 01/06/2021 16:38   Result Date: 01/06/2021 CLINICAL DATA:  Increased shortness of breath for 2 weeks, concern for pulmonary embolism and metastatic disease. EXAM: CT ANGIOGRAPHY CHEST CT ABDOMEN AND PELVIS WITH CONTRAST TECHNIQUE: Multidetector CT imaging of the chest was performed using the standard protocol during bolus administration of intravenous contrast. Multiplanar CT image reconstructions and MIPs were obtained to evaluate the vascular anatomy. Multidetector CT imaging of the abdomen and pelvis was performed using the standard protocol during bolus administration of intravenous contrast. CONTRAST:  75mL OMNIPAQUE IOHEXOL 350 MG/ML SOLN COMPARISON:  Same day chest radiograph and CT abdomen dated 11/20/2007. FINDINGS: CTA CHEST FINDINGS Cardiovascular: Satisfactory opacification of the pulmonary arteries to the segmental level. Multiple filling defects in segmental and subsegmental pulmonary arteries supplying the right upper lobe and left upper lobe are consistent with pulmonary emboli. The main pulmonary artery is enlarged, measuring 3.3 cm in diameter, suggestive of  pulmonary hypertension. Vascular calcifications are seen in the coronary arteries and aortic arch. Normal heart size. No pericardial effusion. Mediastinum/Nodes: Bilateral hilar and mediastinal adenopathy is noted. For reference, a right paratracheal lymph node measures 1.1 cm short axis (series 5, image 26). No enlarged axillary lymph nodes. A left thyroid nodule measures 2.9 x 1.5 cm. The trachea and esophagus demonstrate no significant findings. Lungs/Pleura: Severe diffuse bilateral ground-glass opacities and septal line thickening is seen. There is a small left and trace right pleural effusion with associated atelectasis. There is no pneumothorax. Musculoskeletal: No chest wall abnormality. No acute or significant osseous findings. Review of the  MIP images confirms the above findings. CT ABDOMEN and PELVIS FINDINGS Hepatobiliary: An 8 mm cyst is seen in the right hepatic lobe. No gallstones, gallbladder wall thickening, or biliary dilatation. Pancreas: Unremarkable. No pancreatic ductal dilatation or surrounding inflammatory changes. Spleen: Normal in size without focal abnormality. Adrenals/Urinary Tract: Adrenal glands are unremarkable. Other than a 2 cm cyst in the left kidney, the kidneys are normal, without renal calculi, focal lesion, or hydronephrosis. Bladder is unremarkable. Stomach/Bowel: There is a small hiatal hernia. Appendix appears normal. No evidence of bowel wall thickening, distention, or inflammatory changes. Vascular/Lymphatic: Aortic atherosclerosis. No enlarged abdominal or pelvic lymph nodes. Reproductive: Uterus and bilateral adnexa are unremarkable. Other: There is a fat containing ventral abdominal wall hernia near the umbilicus. No free intraperitoneal fluid. Musculoskeletal: Degenerative changes are seen in the spine. Review of the MIP images confirms the above findings. IMPRESSION: 1. Multiple filling defects in segmental and subsegmental pulmonary arteries supplying the right upper  lobe and left upper lobe, consistent with pulmonary emboli. 2. Severe diffuse bilateral ground-glass opacities and septal line thickening likely represent pulmonary edema/pneumonia. Small left and trace right pleural effusions with associated atelectasis. The previously questioned masslike opacity overlying the right mid and lower lung likely reflected the right atrial silhouette. 3. Bilateral hilar and mediastinal adenopathy is likely reactive. 4. Left thyroid nodule measures 2.9 x 1.5 cm. Recommend thyroid US (ref: J Am Coll Radiol. 2015 Feb;12(2): 143-50). 5. No acute process in the abdomen or pelvis. Aortic Atherosclerosis (ICD10-I70.0). These results were called by telephone at the time of interpretation on 01/06/2021 at 4:10 pm to provider Dr. Dionne Bucy, who verbally acknowledged these results. Electronically Signed: By: Romona Curls M.D. On: 01/06/2021 16:11   CT ABDOMEN PELVIS W CONTRAST  Addendum Date: 01/06/2021   ADDENDUM REPORT: 01/06/2021 16:38 ADDENDUM: Of note, in the CTA CHEST FINDINGS section, under the Cardiovascular subsection, the original report incorrectly states that there is no pericardial effusion. This section should read that there is a small pericardial effusion. The right report is otherwise unchanged. Electronically Signed   By: Romona Curls M.D.   On: 01/06/2021 16:38   Result Date: 01/06/2021 CLINICAL DATA:  Increased shortness of breath for 2 weeks, concern for pulmonary embolism and metastatic disease. EXAM: CT ANGIOGRAPHY CHEST CT ABDOMEN AND PELVIS WITH CONTRAST TECHNIQUE: Multidetector CT imaging of the chest was performed using the standard protocol during bolus administration of intravenous contrast. Multiplanar CT image reconstructions and MIPs were obtained to evaluate the vascular anatomy. Multidetector CT imaging of the abdomen and pelvis was performed using the standard protocol during bolus administration of intravenous contrast. CONTRAST:  75mL OMNIPAQUE  IOHEXOL 350 MG/ML SOLN COMPARISON:  Same day chest radiograph and CT abdomen dated 11/20/2007. FINDINGS: CTA CHEST FINDINGS Cardiovascular: Satisfactory opacification of the pulmonary arteries to the segmental level. Multiple filling defects in segmental and subsegmental pulmonary arteries supplying the right upper lobe and left upper lobe are consistent with pulmonary emboli. The main pulmonary artery is enlarged, measuring 3.3 cm in diameter, suggestive of pulmonary hypertension. Vascular calcifications are seen in the coronary arteries and aortic arch. Normal heart size. No pericardial effusion. Mediastinum/Nodes: Bilateral hilar and mediastinal adenopathy is noted. For reference, a right paratracheal lymph node measures 1.1 cm short axis (series 5, image 26). No enlarged axillary lymph nodes. A left thyroid nodule measures 2.9 x 1.5 cm. The trachea and esophagus demonstrate no significant findings. Lungs/Pleura: Severe diffuse bilateral ground-glass opacities and septal line thickening is seen. There is a  small left and trace right pleural effusion with associated atelectasis. There is no pneumothorax. Musculoskeletal: No chest wall abnormality. No acute or significant osseous findings. Review of the MIP images confirms the above findings. CT ABDOMEN and PELVIS FINDINGS Hepatobiliary: An 8 mm cyst is seen in the right hepatic lobe. No gallstones, gallbladder wall thickening, or biliary dilatation. Pancreas: Unremarkable. No pancreatic ductal dilatation or surrounding inflammatory changes. Spleen: Normal in size without focal abnormality. Adrenals/Urinary Tract: Adrenal glands are unremarkable. Other than a 2 cm cyst in the left kidney, the kidneys are normal, without renal calculi, focal lesion, or hydronephrosis. Bladder is unremarkable. Stomach/Bowel: There is a small hiatal hernia. Appendix appears normal. No evidence of bowel wall thickening, distention, or inflammatory changes. Vascular/Lymphatic: Aortic  atherosclerosis. No enlarged abdominal or pelvic lymph nodes. Reproductive: Uterus and bilateral adnexa are unremarkable. Other: There is a fat containing ventral abdominal wall hernia near the umbilicus. No free intraperitoneal fluid. Musculoskeletal: Degenerative changes are seen in the spine. Review of the MIP images confirms the above findings. IMPRESSION: 1. Multiple filling defects in segmental and subsegmental pulmonary arteries supplying the right upper lobe and left upper lobe, consistent with pulmonary emboli. 2. Severe diffuse bilateral ground-glass opacities and septal line thickening likely represent pulmonary edema/pneumonia. Small left and trace right pleural effusions with associated atelectasis. The previously questioned masslike opacity overlying the right mid and lower lung likely reflected the right atrial silhouette. 3. Bilateral hilar and mediastinal adenopathy is likely reactive. 4. Left thyroid nodule measures 2.9 x 1.5 cm. Recommend thyroid US (ref: J Am Coll Radiol. 2015 Feb;12(2): 143-50). 5. No acute process in the abdomen or pelvis. Aortic Atherosclerosis (ICD10-I70.0). These results were called by telephone at the time of interpretation on 01/06/2021 at 4:10 pm to provider Dr. Dionne Bucy, who verbally acknowledged these results. Electronically Signed: By: Romona Curls M.D. On: 01/06/2021 16:11   DG Chest Portable 1 View  Result Date: 01/06/2021 CLINICAL DATA:  Shortness of breath EXAM: PORTABLE CHEST 1 VIEW COMPARISON:  07/06/2014 FINDINGS: Heart size appears within normal limits. Rounded mass-like opacity within the right mid to lower lung field measuring approximately 6.4 cm in diameter. Diffuse interstitial opacities throughout both lungs. Small right pleural effusion. No pneumothorax. IMPRESSION: 1. Rounded mass-like opacity within the right mid to lower lung field measuring approximately 6.4 cm in diameter. Further evaluation with contrast-enhanced CT of the chest is  recommended. 2. Extensive bilateral interstitial opacities which may reflect pulmonary edema or possibly atypical/viral infection. 3. Small right pleural effusion. Electronically Signed   By: Duanne Guess D.O.   On: 01/06/2021 14:12    Review of Systems  Constitutional: Positive for fatigue. Negative for appetite change, chills and fever.  HENT: Negative.   Respiratory: Positive for shortness of breath. Negative for chest tightness, wheezing and stridor.   Cardiovascular: Positive for leg swelling. Negative for chest pain and palpitations.  Gastrointestinal: Negative for abdominal distention, abdominal pain, diarrhea, nausea and vomiting.  Musculoskeletal: Positive for arthralgias and back pain.  Skin: Positive for color change. Negative for rash and wound.  Neurological: Negative.   Psychiatric/Behavioral: Negative for agitation and confusion.   Blood pressure (!) 152/67, pulse 92, temperature 98.8 F (37.1 C), temperature source Oral, resp. rate 14, height 5\' 7"  (1.702 m), weight 100 kg, SpO2 99 %. Physical Exam Vitals reviewed.  Constitutional:      General: She is not in acute distress.    Appearance: She is obese. She is not diaphoretic.  HENT:  Head: Normocephalic and atraumatic.     Mouth/Throat:     Mouth: Mucous membranes are moist.     Pharynx: Oropharynx is clear.  Neck:     Vascular: No JVD.     Trachea: No tracheal deviation.  Cardiovascular:     Rate and Rhythm: Normal rate and regular rhythm.     Pulses:          Dorsalis pedis pulses are detected w/ Doppler on the right side and detected w/ Doppler on the left side.     Heart sounds: Normal heart sounds. No murmur heard.   Pulmonary:     Effort: Pulmonary effort is normal.     Breath sounds: Examination of the right-upper field reveals decreased breath sounds. Examination of the left-upper field reveals decreased breath sounds. Decreased breath sounds present. No wheezing, rhonchi or rales.  Chest:      Chest wall: No mass or deformity.  Abdominal:     General: Bowel sounds are normal.     Palpations: Abdomen is soft.     Tenderness: There is no abdominal tenderness. There is no guarding.  Musculoskeletal:     Right lower leg: Edema present.     Left lower leg: No tenderness. Edema present.  Feet:     Right foot:     Skin integrity: Skin integrity normal.     Toenail Condition: Right toenails are abnormally thick and long. Fungal disease present.    Left foot:     Skin integrity: Skin integrity normal.     Toenail Condition: Left toenails are abnormally thick and long. Fungal disease present. Skin:    General: Skin is warm.     Capillary Refill: Capillary refill takes 2 to 3 seconds.  Neurological:     General: No focal deficit present.     Mental Status: [name] and oriented to person, place, and time.     Motor: No weakness.  Psychiatric:        Mood and Affect: Mood normal.        Behavior: Behavior normal.     Assessment/Plan: Bilateral PE/pneumonia: At this point its most likely that her hypoxia is due to what appears to be severe bilateral pneumonias.  Though these small subsegmental pulmonary emboli may be contributing factor, her pneumonia is extensive currently.  Recommend heparinization, and antibiotics as is being done.  Echo is still pending and this will be evaluated to rule out any significant right heart strain.  At this point, with the patient hemodynamically stable and not in respiratory distress at this time, would recommend treatment of his pneumonia while we await for the echo.  The patient has significant right heart strain and difficulty with weaning her oxygen despite treatment of her pneumonias with improvement, at that point perhaps pulmonary thrombectomy could be considered.  We will follow with you.  Nilda Riggs 01/06/2021, 5:28 PM

## 2021-01-06 NOTE — Consult Note (Addendum)
ANTICOAGULATION CONSULT NOTE - Initial Consult  Pharmacy Consult for heparin infusion Indication: pulmonary embolus  Not on File  Patient Measurements: Height: 5\' 7"  (170.2 cm) Weight: 100 kg (220 lb 7.4 oz) IBW/kg (Calculated) : 61.6 Heparin Dosing Weight: 83.9 kg  Vital Signs: Temp: 98.1 F (36.7 C) (04/23 1304) Temp Source: Oral (04/23 1304) BP: 167/89 (04/23 1457) Pulse Rate: 90 (04/23 1457)  Labs: Recent Labs    01/06/21 1305  HGB 15.6*  HCT 49.8*  PLT 123*  CREATININE 0.89  TROPONINIHS 20*  19*    Estimated Creatinine Clearance: 60.3 mL/min (by C-G formula based on SCr of 0.89 mg/dL).   Medical History: History reviewed. No pertinent past medical history.  Medications:   enoxaparin 50 mg SQ x 1 given 04/23 1600 (DVT ppx)  Assessment: 80 y.o. female presents to the emergency department for chief concerns of shortness of breath. CT exam showing bilateral segmental and subsegmental PE. Pharmacy has been consulted for initiation and management of heparin infusion.  Goal of Therapy:  Heparin level 0.3-0.7 units/ml Monitor platelets by anticoagulation protocol: Yes   Plan:  After discussion with provider, will give additional 50 mg enoxaparin Briscoe x 1 for total of 1 mg/kg treatment dose Start heparin infusion at 1400 units/hr in 8 hours following enoxaparin treatment dose Check anti-Xa level in 8 hours following heparin initiation Continue to monitor H&H and platelets daily   94, PharmD, BCPS 01/06/2021,4:54 PM

## 2021-01-06 NOTE — ED Triage Notes (Signed)
Sob, loss of taiste x 2 weeks

## 2021-01-07 DIAGNOSIS — I2694 Multiple subsegmental pulmonary emboli without acute cor pulmonale: Secondary | ICD-10-CM | POA: Diagnosis not present

## 2021-01-07 DIAGNOSIS — I82431 Acute embolism and thrombosis of right popliteal vein: Secondary | ICD-10-CM | POA: Diagnosis present

## 2021-01-07 DIAGNOSIS — R0602 Shortness of breath: Secondary | ICD-10-CM | POA: Diagnosis present

## 2021-01-07 DIAGNOSIS — E041 Nontoxic single thyroid nodule: Secondary | ICD-10-CM | POA: Diagnosis present

## 2021-01-07 DIAGNOSIS — J189 Pneumonia, unspecified organism: Secondary | ICD-10-CM | POA: Diagnosis present

## 2021-01-07 DIAGNOSIS — U071 COVID-19: Secondary | ICD-10-CM

## 2021-01-07 DIAGNOSIS — E876 Hypokalemia: Secondary | ICD-10-CM | POA: Diagnosis not present

## 2021-01-07 DIAGNOSIS — A4189 Other specified sepsis: Secondary | ICD-10-CM | POA: Diagnosis present

## 2021-01-07 DIAGNOSIS — G8929 Other chronic pain: Secondary | ICD-10-CM | POA: Diagnosis present

## 2021-01-07 DIAGNOSIS — Z20822 Contact with and (suspected) exposure to covid-19: Secondary | ICD-10-CM | POA: Diagnosis present

## 2021-01-07 DIAGNOSIS — J811 Chronic pulmonary edema: Secondary | ICD-10-CM | POA: Diagnosis present

## 2021-01-07 DIAGNOSIS — J9601 Acute respiratory failure with hypoxia: Secondary | ICD-10-CM

## 2021-01-07 DIAGNOSIS — J80 Acute respiratory distress syndrome: Secondary | ICD-10-CM | POA: Diagnosis present

## 2021-01-07 DIAGNOSIS — Z6836 Body mass index (BMI) 36.0-36.9, adult: Secondary | ICD-10-CM | POA: Diagnosis not present

## 2021-01-07 DIAGNOSIS — B370 Candidal stomatitis: Secondary | ICD-10-CM | POA: Diagnosis not present

## 2021-01-07 DIAGNOSIS — Z28310 Unvaccinated for covid-19: Secondary | ICD-10-CM | POA: Diagnosis not present

## 2021-01-07 DIAGNOSIS — Z7189 Other specified counseling: Secondary | ICD-10-CM | POA: Diagnosis not present

## 2021-01-07 DIAGNOSIS — E785 Hyperlipidemia, unspecified: Secondary | ICD-10-CM | POA: Diagnosis present

## 2021-01-07 DIAGNOSIS — R6520 Severe sepsis without septic shock: Secondary | ICD-10-CM | POA: Diagnosis present

## 2021-01-07 DIAGNOSIS — I248 Other forms of acute ischemic heart disease: Secondary | ICD-10-CM | POA: Diagnosis present

## 2021-01-07 DIAGNOSIS — R54 Age-related physical debility: Secondary | ICD-10-CM | POA: Diagnosis present

## 2021-01-07 DIAGNOSIS — Z515 Encounter for palliative care: Secondary | ICD-10-CM | POA: Diagnosis not present

## 2021-01-07 DIAGNOSIS — I2699 Other pulmonary embolism without acute cor pulmonale: Secondary | ICD-10-CM

## 2021-01-07 DIAGNOSIS — I10 Essential (primary) hypertension: Secondary | ICD-10-CM | POA: Diagnosis present

## 2021-01-07 DIAGNOSIS — M797 Fibromyalgia: Secondary | ICD-10-CM | POA: Diagnosis present

## 2021-01-07 DIAGNOSIS — Z79899 Other long term (current) drug therapy: Secondary | ICD-10-CM | POA: Diagnosis not present

## 2021-01-07 DIAGNOSIS — D6959 Other secondary thrombocytopenia: Secondary | ICD-10-CM | POA: Diagnosis present

## 2021-01-07 DIAGNOSIS — E441 Mild protein-calorie malnutrition: Secondary | ICD-10-CM | POA: Diagnosis present

## 2021-01-07 DIAGNOSIS — I82451 Acute embolism and thrombosis of right peroneal vein: Secondary | ICD-10-CM | POA: Diagnosis present

## 2021-01-07 DIAGNOSIS — R918 Other nonspecific abnormal finding of lung field: Secondary | ICD-10-CM | POA: Diagnosis not present

## 2021-01-07 LAB — RESPIRATORY PANEL BY PCR

## 2021-01-07 LAB — URINALYSIS, COMPLETE (UACMP) WITH MICROSCOPIC
Bacteria, UA: NONE SEEN
Glucose, UA: NEGATIVE mg/dL
Hgb urine dipstick: NEGATIVE
Ketones, ur: NEGATIVE mg/dL
Leukocytes,Ua: NEGATIVE
Protein, ur: NEGATIVE mg/dL
Specific Gravity, Urine: >1.046 — ABNORMAL HIGH (ref 1.005–1.030)
pH: 6 (ref 5.0–8.0)

## 2021-01-07 LAB — COMPREHENSIVE METABOLIC PANEL
ALT: 17 U/L (ref 0–44)
AST: 34 U/L (ref 15–41)
Albumin: 2.4 g/dL — ABNORMAL LOW (ref 3.5–5.0)
Alkaline Phosphatase: 52 U/L (ref 38–126)
Anion gap: 10 (ref 5–15)
BUN: 17 mg/dL (ref 8–23)
CO2: 25 mmol/L (ref 22–32)
Calcium: 7.6 mg/dL — ABNORMAL LOW (ref 8.9–10.3)
Chloride: 107 mmol/L (ref 98–111)
Creatinine, Ser: 0.78 mg/dL (ref 0.44–1.00)
GFR, Estimated: >60 mL/min (ref >60)
Glucose, Bld: 119 mg/dL — ABNORMAL HIGH (ref 70–99)
Potassium: 3.6 mmol/L (ref 3.5–5.1)
Sodium: 142 mmol/L (ref 135–145)
Total Bilirubin: 2.7 mg/dL — ABNORMAL HIGH (ref 0.3–1.2)
Total Protein: 6.1 g/dL — ABNORMAL LOW (ref 6.5–8.1)

## 2021-01-07 LAB — PROTIME-INR
INR: 1.6 — ABNORMAL HIGH (ref 0.8–1.2)
Prothrombin Time: 18.7 seconds — ABNORMAL HIGH (ref 11.4–15.2)

## 2021-01-07 LAB — HEPARIN LEVEL (UNFRACTIONATED)
Heparin Unfractionated: 0.28 IU/mL — ABNORMAL LOW (ref 0.30–0.70)
Heparin Unfractionated: 0.47 IU/mL (ref 0.30–0.70)

## 2021-01-07 LAB — BRAIN NATRIURETIC PEPTIDE: B Natriuretic Peptide: 251.6 pg/mL — ABNORMAL HIGH (ref 0.0–100.0)

## 2021-01-07 LAB — CBC
HCT: 47.3 % — ABNORMAL HIGH (ref 36.0–46.0)
Hemoglobin: 14.6 g/dL (ref 12.0–15.0)
MCH: 29.6 pg (ref 26.0–34.0)
MCHC: 30.9 g/dL (ref 30.0–36.0)
MCV: 95.7 fL (ref 80.0–100.0)
Platelets: 120 10*3/uL — ABNORMAL LOW (ref 150–400)
RBC: 4.94 MIL/uL (ref 3.87–5.11)
RDW: 19 % — ABNORMAL HIGH (ref 11.5–15.5)
WBC: 19.1 10*3/uL — ABNORMAL HIGH (ref 4.0–10.5)
nRBC: 0.8 % — ABNORMAL HIGH (ref 0.0–0.2)

## 2021-01-07 LAB — PROCALCITONIN: Procalcitonin: 0.1 ng/mL

## 2021-01-07 MED ORDER — FUROSEMIDE 10 MG/ML IJ SOLN
40.0000 mg | Freq: Two times a day (BID) | INTRAMUSCULAR | Status: DC
  Administered 2021-01-07 – 2021-01-13 (×13): 40 mg via INTRAVENOUS
  Filled 2021-01-07 (×13): qty 4

## 2021-01-07 MED ORDER — HEPARIN BOLUS VIA INFUSION
1200.0000 [IU] | Freq: Once | INTRAVENOUS | Status: AC
  Administered 2021-01-07: 1200 [IU] via INTRAVENOUS
  Filled 2021-01-07: qty 1200

## 2021-01-07 MED ORDER — METHYLPREDNISOLONE SODIUM SUCC 125 MG IJ SOLR
60.0000 mg | Freq: Two times a day (BID) | INTRAMUSCULAR | Status: DC
  Administered 2021-01-07 – 2021-01-09 (×4): 60 mg via INTRAVENOUS
  Filled 2021-01-07 (×4): qty 2

## 2021-01-07 MED ORDER — HYDRALAZINE HCL 25 MG PO TABS: 25.0000 mg | ORAL_TABLET | Freq: Three times a day (TID) | ORAL | Status: DC | PRN

## 2021-01-07 NOTE — Consult Note (Signed)
ANTICOAGULATION CONSULT NOTE  Pharmacy Consult for heparin infusion Indication: pulmonary embolus  Not on File  Patient Measurements: Height: 5\' 6"  (167.6 cm) Weight: 99.5 kg (219 lb 6.4 oz) IBW/kg (Calculated) : 59.3 Heparin Dosing Weight: 83.9 kg  Vital Signs: Temp: 98.7 F (37.1 C) (04/24 2054) Temp Source: Oral (04/24 2054) BP: 155/58 (04/24 2054) Pulse Rate: 92 (04/24 2054)  Labs: Recent Labs    01/06/21 1305 01/06/21 1644 01/07/21 0456 01/07/21 1140 01/07/21 2238  HGB 15.6*  --  14.6  --   --   HCT 49.8*  --  47.3*  --   --   PLT 123*  --  120*  --   --   LABPROT  --  18.6* 18.7*  --   --   INR  --  1.6* 1.6*  --   --   HEPARINUNFRC  --   --   --  0.28* 0.47  CREATININE 0.89  --  0.78  --   --   TROPONINIHS 20*  19*  --   --   --   --     Estimated Creatinine Clearance: 65.6 mL/min (by C-G formula based on SCr of 0.78 mg/dL).   Medical History: History reviewed. No pertinent past medical history.  Medications:   enoxaparin 50 mg SQ x 1 given 04/23 1600 (DVT ppx)  Assessment: 80 y.o. female presents to the emergency department for chief concerns of shortness of breath. CT exam showing bilateral segmental and subsegmental PE. Pharmacy has been consulted for initiation and management of heparin infusion.  Goal of Therapy:  Heparin level 0.3-0.7 units/ml Monitor platelets by anticoagulation protocol: Yes   4/24 1140 HL 0.28, subtherapeutic 4/24 2238 HL 0.47, therapeutic x 1  Plan:  Continue heparin infusion at rate of 1550 units/hr Recheck HL in 8 hours to confirm. Continue to monitor H&H and platelets daily   5/24, PharmD, [facility] 01/07/2021 11:30 PM

## 2021-01-07 NOTE — Progress Notes (Addendum)
Subjective/Chief Complaint: 80 yo woman admitted with pneumonia, DVT/PE.  ECHO ordered yesterday still pending.  She remains stable overnight.  Sats are 92% on NRB.  She is comfortable, other than a sore throat when she swallows.  Denies sig SOB at the moment. No chest pain.  CT showed segmental R apical and subseg LUL PE. Pt reports a productive cough with whitish sputum.   Objective: Vital signs in last 24 hours: Temp:  [97.6 F (36.4 C)-98.8 F (37.1 C)] 98.4 F (36.9 C) (04/24 0902) Pulse Rate:  [78-98] 94 (04/24 0902) Resp:  [14-30] 18 (04/24 0902) BP: (129-175)/(58-89) 145/62 (04/24 0902) SpO2:  [71 %-99 %] 92 % (04/24 0902) Weight:  [99.5 kg-100 kg] 99.5 kg (04/23 1707) Last BM Date: 01/05/21  Intake/Output from previous day: 04/23 0701 - 04/24 0700 In: 972.1 [I.V.:872.1; IV Piggyback:100] Out: -  Intake/Output this shift: Total I/O In: -  Out: 500 [Urine:500]  Physical Exam Constitutional:      General: She is not in acute distress.    Appearance: She is obese. She is not toxic-appearing.  HENT:     Head: Normocephalic and atraumatic.  Neck:     Vascular: No JVD.  Cardiovascular:     Rate and Rhythm: Normal rate and regular rhythm.     Heart sounds: No murmur heard.   Pulmonary:     Effort: Pulmonary effort is normal. No tachypnea, accessory muscle usage or respiratory distress.     Breath sounds: Examination of the right-upper field reveals decreased breath sounds. Examination of the left-upper field reveals decreased breath sounds. Decreased breath sounds present.  Abdominal:     Palpations: Abdomen is soft.     Tenderness: There is no abdominal tenderness.  Skin:    Capillary Refill: Capillary refill takes less than 2 seconds.  Neurological:     General: No focal deficit present.     Mental Status: [name].  Psychiatric:        Mood and Affect: Mood is not anxious.        Behavior: Behavior is not agitated.      Lab Results:  Recent Labs     01/06/21 1305 01/07/21 0456  WBC 17.3* 19.1*  HGB 15.6* 14.6  HCT 49.8* 47.3*  PLT 123* 120*   BMET Recent Labs    01/06/21 1305 01/07/21 0456  NA 139 142  K 4.0 3.6  CL 103 107  CO2 25 25  GLUCOSE 139* 119*  BUN 20 17  CREATININE 0.89 0.78  CALCIUM 7.9* 7.6*   PT/INR Recent Labs    01/06/21 1644 01/07/21 0456  LABPROT 18.6* 18.7*  INR 1.6* 1.6*   ABG No results for input(s): PHART, HCO3 in the last 72 hours.  Invalid input(s): PCO2, PO2  Studies/Results: CT Angio Chest PE W and/or Wo Contrast  Addendum Date: 01/06/2021   ADDENDUM REPORT: 01/06/2021 16:38 ADDENDUM: Of note, in the CTA CHEST FINDINGS section, under the Cardiovascular subsection, the original report incorrectly states that there is no pericardial effusion. This section should read that there is a small pericardial effusion. The right report is otherwise unchanged. Electronically Signed   By: Romona Curls M.D.   On: 01/06/2021 16:38   Result Date: 01/06/2021 CLINICAL DATA:  Increased shortness of breath for 2 weeks, concern for pulmonary embolism and metastatic disease. EXAM: CT ANGIOGRAPHY CHEST CT ABDOMEN AND PELVIS WITH CONTRAST TECHNIQUE: Multidetector CT imaging of the chest was performed using the standard protocol during bolus administration of  intravenous contrast. Multiplanar CT image reconstructions and MIPs were obtained to evaluate the vascular anatomy. Multidetector CT imaging of the abdomen and pelvis was performed using the standard protocol during bolus administration of intravenous contrast. CONTRAST:  75mL OMNIPAQUE IOHEXOL 350 MG/ML SOLN COMPARISON:  Same day chest radiograph and CT abdomen dated 11/20/2007. FINDINGS: CTA CHEST FINDINGS Cardiovascular: Satisfactory opacification of the pulmonary arteries to the segmental level. Multiple filling defects in segmental and subsegmental pulmonary arteries supplying the right upper lobe and left upper lobe are consistent with pulmonary emboli.  The main pulmonary artery is enlarged, measuring 3.3 cm in diameter, suggestive of pulmonary hypertension. Vascular calcifications are seen in the coronary arteries and aortic arch. Normal heart size. No pericardial effusion. Mediastinum/Nodes: Bilateral hilar and mediastinal adenopathy is noted. For reference, a right paratracheal lymph node measures 1.1 cm short axis (series 5, image 26). No enlarged axillary lymph nodes. A left thyroid nodule measures 2.9 x 1.5 cm. The trachea and esophagus demonstrate no significant findings. Lungs/Pleura: Severe diffuse bilateral ground-glass opacities and septal line thickening is seen. There is a small left and trace right pleural effusion with associated atelectasis. There is no pneumothorax. Musculoskeletal: No chest wall abnormality. No acute or significant osseous findings. Review of the MIP images confirms the above findings. CT ABDOMEN and PELVIS FINDINGS Hepatobiliary: An 8 mm cyst is seen in the right hepatic lobe. No gallstones, gallbladder wall thickening, or biliary dilatation. Pancreas: Unremarkable. No pancreatic ductal dilatation or surrounding inflammatory changes. Spleen: Normal in size without focal abnormality. Adrenals/Urinary Tract: Adrenal glands are unremarkable. Other than a 2 cm cyst in the left kidney, the kidneys are normal, without renal calculi, focal lesion, or hydronephrosis. Bladder is unremarkable. Stomach/Bowel: There is a small hiatal hernia. Appendix appears normal. No evidence of bowel wall thickening, distention, or inflammatory changes. Vascular/Lymphatic: Aortic atherosclerosis. No enlarged abdominal or pelvic lymph nodes. Reproductive: Uterus and bilateral adnexa are unremarkable. Other: There is a fat containing ventral abdominal wall hernia near the umbilicus. No free intraperitoneal fluid. Musculoskeletal: Degenerative changes are seen in the spine. Review of the MIP images confirms the above findings. IMPRESSION: 1. Multiple filling  defects in segmental and subsegmental pulmonary arteries supplying the right upper lobe and left upper lobe, consistent with pulmonary emboli. 2. Severe diffuse bilateral ground-glass opacities and septal line thickening likely represent pulmonary edema/pneumonia. Small left and trace right pleural effusions with associated atelectasis. The previously questioned masslike opacity overlying the right mid and lower lung likely reflected the right atrial silhouette. 3. Bilateral hilar and mediastinal adenopathy is likely reactive. 4. Left thyroid nodule measures 2.9 x 1.5 cm. Recommend thyroid US (ref: J Am Coll Radiol. 2015 Feb;12(2): 143-50). 5. No acute process in the abdomen or pelvis. Aortic Atherosclerosis (ICD10-I70.0). These results were called by telephone at the time of interpretation on 01/06/2021 at 4:10 pm to provider Dr. Dionne BucySebastian Siadecki, who verbally acknowledged these results. Electronically Signed: By: Romona Curlsyler  Litton M.D. On: 01/06/2021 16:11   CT ABDOMEN PELVIS W CONTRAST  Addendum Date: 01/06/2021   ADDENDUM REPORT: 01/06/2021 16:38 ADDENDUM: Of note, in the CTA CHEST FINDINGS section, under the Cardiovascular subsection, the original report incorrectly states that there is no pericardial effusion. This section should read that there is a small pericardial effusion. The right report is otherwise unchanged. Electronically Signed   By: Romona Curlsyler  Litton M.D.   On: 01/06/2021 16:38   Result Date: 01/06/2021 CLINICAL DATA:  Increased shortness of breath for 2 weeks, concern for pulmonary embolism and metastatic  disease. EXAM: CT ANGIOGRAPHY CHEST CT ABDOMEN AND PELVIS WITH CONTRAST TECHNIQUE: Multidetector CT imaging of the chest was performed using the standard protocol during bolus administration of intravenous contrast. Multiplanar CT image reconstructions and MIPs were obtained to evaluate the vascular anatomy. Multidetector CT imaging of the abdomen and pelvis was performed using the standard  protocol during bolus administration of intravenous contrast. CONTRAST:  52mL OMNIPAQUE IOHEXOL 350 MG/ML SOLN COMPARISON:  Same day chest radiograph and CT abdomen dated 11/20/2007. FINDINGS: CTA CHEST FINDINGS Cardiovascular: Satisfactory opacification of the pulmonary arteries to the segmental level. Multiple filling defects in segmental and subsegmental pulmonary arteries supplying the right upper lobe and left upper lobe are consistent with pulmonary emboli. The main pulmonary artery is enlarged, measuring 3.3 cm in diameter, suggestive of pulmonary hypertension. Vascular calcifications are seen in the coronary arteries and aortic arch. Normal heart size. No pericardial effusion. Mediastinum/Nodes: Bilateral hilar and mediastinal adenopathy is noted. For reference, a right paratracheal lymph node measures 1.1 cm short axis (series 5, image 26). No enlarged axillary lymph nodes. A left thyroid nodule measures 2.9 x 1.5 cm. The trachea and esophagus demonstrate no significant findings. Lungs/Pleura: Severe diffuse bilateral ground-glass opacities and septal line thickening is seen. There is a small left and trace right pleural effusion with associated atelectasis. There is no pneumothorax. Musculoskeletal: No chest wall abnormality. No acute or significant osseous findings. Review of the MIP images confirms the above findings. CT ABDOMEN and PELVIS FINDINGS Hepatobiliary: An 8 mm cyst is seen in the right hepatic lobe. No gallstones, gallbladder wall thickening, or biliary dilatation. Pancreas: Unremarkable. No pancreatic ductal dilatation or surrounding inflammatory changes. Spleen: Normal in size without focal abnormality. Adrenals/Urinary Tract: Adrenal glands are unremarkable. Other than a 2 cm cyst in the left kidney, the kidneys are normal, without renal calculi, focal lesion, or hydronephrosis. Bladder is unremarkable. Stomach/Bowel: There is a small hiatal hernia. Appendix appears normal. No evidence of  bowel wall thickening, distention, or inflammatory changes. Vascular/Lymphatic: Aortic atherosclerosis. No enlarged abdominal or pelvic lymph nodes. Reproductive: Uterus and bilateral adnexa are unremarkable. Other: There is a fat containing ventral abdominal wall hernia near the umbilicus. No free intraperitoneal fluid. Musculoskeletal: Degenerative changes are seen in the spine. Review of the MIP images confirms the above findings. IMPRESSION: 1. Multiple filling defects in segmental and subsegmental pulmonary arteries supplying the right upper lobe and left upper lobe, consistent with pulmonary emboli. 2. Severe diffuse bilateral ground-glass opacities and septal line thickening likely represent pulmonary edema/pneumonia. Small left and trace right pleural effusions with associated atelectasis. The previously questioned masslike opacity overlying the right mid and lower lung likely reflected the right atrial silhouette. 3. Bilateral hilar and mediastinal adenopathy is likely reactive. 4. Left thyroid nodule measures 2.9 x 1.5 cm. Recommend thyroid US (ref: J Am Coll Radiol. 2015 Feb;12(2): 143-50). 5. No acute process in the abdomen or pelvis. Aortic Atherosclerosis (ICD10-I70.0). These results were called by telephone at the time of interpretation on 01/06/2021 at 4:10 pm to provider Dr. Dionne Bucy, who verbally acknowledged these results. Electronically Signed: By: Romona Curls M.D. On: 01/06/2021 16:11   US Venous Img Lower Bilateral (DVT)  Result Date: 01/07/2021 CLINICAL DATA:  Pulmonary embolism. EXAM: BILATERAL LOWER EXTREMITY VENOUS DOPPLER ULTRASOUND TECHNIQUE: Gray-scale sonography with compression, as well as color and duplex ultrasound, were performed to evaluate the deep venous system(s) from the level of the common femoral vein through the popliteal and proximal calf veins. COMPARISON:  None. FINDINGS: VENOUS On  the right, hypoechoic thrombus in a noncompressible peroneal vein. Normal  compressibility of the common femoral, superficial femoral, and popliteal veins. Visualized portions of profunda femoral vein and great saphenous vein unremarkable. No filling defects to suggest DVT on grayscale or color Doppler imaging. On the left, Normal compressibility of the common femoral, superficial femoral, and popliteal veins, as well as the visualized calf veins. Visualized portions of profunda femoral vein and great saphenous vein unremarkable. No filling defects to suggest DVT on grayscale or color Doppler imaging. Doppler waveforms show normal direction of venous flow, normal respiratory phasicity and response to augmentation. OTHER None. Limitations: none IMPRESSION: 1. POSITIVE for isolated right peroneal (calf) DVT. 2. Negative for left lower extremity DVT. Electronically Signed   By: Corlis Leak M.D.   On: 01/07/2021 08:03   DG Chest Portable 1 View  Result Date: 01/06/2021 CLINICAL DATA:  Shortness of breath EXAM: PORTABLE CHEST 1 VIEW COMPARISON:  07/06/2014 FINDINGS: Heart size appears within normal limits. Rounded mass-like opacity within the right mid to lower lung field measuring approximately 6.4 cm in diameter. Diffuse interstitial opacities throughout both lungs. Small right pleural effusion. No pneumothorax. IMPRESSION: 1. Rounded mass-like opacity within the right mid to lower lung field measuring approximately 6.4 cm in diameter. Further evaluation with contrast-enhanced CT of the chest is recommended. 2. Extensive bilateral interstitial opacities which may reflect pulmonary edema or possibly atypical/viral infection. 3. Small right pleural effusion. Electronically Signed   By: Duanne Guess D.O.   On: 01/06/2021 14:12    Anti-infectives: Anti-infectives (From admission, onward)   Start     Dose/Rate Route Frequency Ordered Stop   01/07/21 1500  cefTRIAXone (ROCEPHIN) 2 g in sodium chloride 0.9 % 100 mL IVPB        2 g 200 mL/hr over 30 Minutes Intravenous Every 24 hours  01/06/21 1524     01/07/21 1500  azithromycin (ZITHROMAX) 500 mg in sodium chloride 0.9 % 250 mL IVPB        500 mg 250 mL/hr over 60 Minutes Intravenous Every 24 hours 01/06/21 1524     01/06/21 1515  cefTRIAXone (ROCEPHIN) 2 g in sodium chloride 0.9 % 100 mL IVPB        2 g 200 mL/hr over 30 Minutes Intravenous  Once 01/06/21 1506 01/06/21 1546   01/06/21 1515  azithromycin (ZITHROMAX) 500 mg in sodium chloride 0.9 % 250 mL IVPB        500 mg 250 mL/hr over 60 Minutes Intravenous  Once 01/06/21 1506 01/06/21 1645      Assessment/Plan: PE/DVT Pneumonia  ECHO pending to evaluate for CHF, right heart strain.  CT again shows fairly diffuse bilateral pneumonia (perhaps viral) with perhaps edema.  Need to assess heart function with echo and again look for any significant right heart strain.  Most likely her significant hypoxia due to the pnuemonia more than the segmental/subseg pe, however, chances of any possible significant clinical improvement with  Thrombectomy difficult to yet evaluate without echo.  In case she should decompensate, will make her NPO after MN, however at this time would continue to just treat her aggressively for her pneumonia and provide supportive care. If she should worsen from a respiratory status, would also consider obtaining repeat CT.  Thanks  LOS: 0 days    Nilda Riggs 01/07/2021

## 2021-01-07 NOTE — Consult Note (Signed)
ANTICOAGULATION CONSULT NOTE  Pharmacy Consult for heparin infusion Indication: pulmonary embolus  Not on File  Patient Measurements: Height: 5\' 6"  (167.6 cm) Weight: 99.5 kg (219 lb 6.4 oz) IBW/kg (Calculated) : 59.3 Heparin Dosing Weight: 83.9 kg  Vital Signs: Temp: 97.9 F (36.6 C) (04/24 1202) Temp Source: Oral (04/24 0400) BP: 140/77 (04/24 1202) Pulse Rate: 93 (04/24 1202)  Labs: Recent Labs    01/06/21 1305 01/06/21 1644 01/07/21 0456 01/07/21 1140  HGB 15.6*  --  14.6  --   HCT 49.8*  --  47.3*  --   PLT 123*  --  120*  --   LABPROT  --  18.6* 18.7*  --   INR  --  1.6* 1.6*  --   HEPARINUNFRC  --   --   --  0.28*  CREATININE 0.89  --  0.78  --   TROPONINIHS 20*  19*  --   --   --     Estimated Creatinine Clearance: 65.6 mL/min (by C-G formula based on SCr of 0.78 mg/dL).   Medical History: History reviewed. No pertinent past medical history.  Medications:   enoxaparin 50 mg SQ x 1 given 04/23 1600 (DVT ppx)  Assessment: 80 y.o. female presents to the emergency department for chief concerns of shortness of breath. CT exam showing bilateral segmental and subsegmental PE. Pharmacy has been consulted for initiation and management of heparin infusion.  Goal of Therapy:  Heparin level 0.3-0.7 units/ml Monitor platelets by anticoagulation protocol: Yes   4/24 1140 HL 0.28, subtherapeutic  Plan:  Heparin 1200 unit bolus followed by increase in rate to 1550 units/hr Check HL at 2300 Continue to monitor H&H and platelets daily   5/24, PharmD 01/07/2021,1:24 PM

## 2021-01-07 NOTE — TOC Progression Note (Signed)
Transition of Care [facility]) - Progression Note    Patient Details  Name: Elizabeth Rowland MRN: [id number] Date of Birth: 1941-02-26  [facility] of [facility] CM/SW Contact  Bing Quarry, RN Phone Number: 01/07/2021, 10:12 AM  Clinical Narrative:  OBS status note: 4/24: OBS ED: 4/23 at 1pm. At 18h 3m at time of this note.  . Presented with SOB/loss of taste 2wks.  . Previously independent, some balance issues.  . From private residence/ living w children/independent PTA. Marland Kitchen Htx of fibromyalgia andHTN.  . Critical Lactic Acid 2.2. on 4/23 . Currently progressed to HFNC at 10L at 0930 this am.  . Heparin Gtt started at midnight.  Gabriel Cirri RN CM [phone]      Expected Discharge Plan and Services                                                 Social Determinants of Health (SDOH) Interventions    Readmission Risk Interventions No flowsheet data found.

## 2021-01-07 NOTE — Progress Notes (Addendum)
PROGRESS NOTE    Elizabeth Rowland  OMV:[id number] DOB: [birth year] DOA: 01/06/2021 PCP: Marguerita Merles, MD   Chief complaint.  Shortness of breath. Brief Narrative:  Elizabeth Rowland is a 80 y.o. female with medical history significant for fibromyalgia, history of hypertension, history of hyperlipidemia, currently on tramadol and gabapentin for chronic pain and fibromyalgia, no longer taking antihypertensives for pravastatin, presents to the emergency department for chief concerns of shortness of breath.  Symptoms started 3 weeks ago. In the emergency room, patient was found to have acute respiratory failure, placed on 10 L oxygen.  She also met severe sepsis criteria. CT angiogram showed multiple PE with extensive groundglass opacities as well as interstitial changes. Patient was not vaccinated for COVID-19. Is placed on antibiotics with Zithromax and Rocephin.   Assessment & Plan:   Active Problems:   Severe sepsis with acute organ dysfunction (HCC)   Hyperlipidemia   Fibromyalgia   Essential hypertension  1.  Acute hypoxemic respiratory failure. ARDS. Presumed COVID-19 infection. Acute pulmonary emboli. Severe sepsis criteria Patient met severe sepsis criteria, could be due to COVID.  No evidence of bacterial infection, procalcitonin level less than 0.1. Patient had no vaccination for COVID, present with 3 weeks symptoms of shortness of breath.  I personally reviewed patient CT scan, consistent with ARDS due to COVID-19 infection. Current COVID PCR negative, I will obtain COVID antibody test.  Patient probably will be positive for COVID-19 IgM. Patient has a severe acute respiratory failure with extensive groundglass changes in the lungs, prognosis is guarded. For now, I will give IV steroids, I will also check CRP, may consider start baricitinib if patient antibody test came back positive. Patient has mild elevation of BNP, I will give IV Lasix.  Echocardiogram is  pending. Patient also has bilateral pulmonary emboli, which may contribute to acute respiratory failure.  She is started on heparin drip. Will continue high flow oxygen, will consider heated high flow or BiPAP if condition gets worse.  #2.  Elevated troponin.  Secondary to demand ischemia from acute respiratory failure. Follow.  3.  Lung mass. Identified on the initial chest x-ray, CT scan did not show up due to extensive chronic changes.  Need to follow-up as outpatient.  4.  Thyroid nodule.  5.  Thrombocytopenia. Likely secondary to PE.  Will follow.  Covid IgG negative (too early to be positive), spoke with lab, will send anti-capsid antibody which contains IgM testing.    DVT prophylaxis: Heparin drip Code Status: Full Family Communication: son updated Disposition Plan:  .   Status is: Observation  The patient will require care spanning > 2 midnights and should be moved to inpatient because: Inpatient level of care appropriate due to severity of illness  Dispo: The patient is from: Home              Anticipated d/c is to: Home              Patient currently is not medically stable to d/c.   Difficult to place patient No        I/O last 3 completed shifts: In: 972.1 [I.V.:872.1; IV Piggyback:100] Out: -  Total I/O In: -  Out: 500 [Urine:500]     Consultants:   none  Procedures: none  Antimicrobials: zithromax  Subjective: Patient has increased oxygen requirement overnight, currently on 15 L oxygen with a facial mask.  She has short of breath with minimal exertion. She has a cough, nonproductive. She did not  have any fever or chills. She denies any chest pain or palpitation. No dysuria hematuria. No headache or dizziness. No abdominal pain or nausea vomiting.  Objective: Vitals:   01/07/21 0400 01/07/21 0902 01/07/21 0931 01/07/21 1202  BP: 130/62 (!) 145/62  140/77  Pulse: 98 94  93  Resp:  18  20  Temp: 98.6 F (37 C) 98.4 F (36.9 C)  97.9  F (36.6 C)  TempSrc: Oral     SpO2: 93% 92% 92% 91%  Weight:      Height:        Intake/Output Summary (Last 24 hours) at 01/07/2021 1207 Last data filed at 01/07/2021 0723 Gross per 24 hour  Intake 972.13 ml  Output 500 ml  Net 472.13 ml   Filed Weights   01/06/21 1301 01/06/21 1707  Weight: 100 kg 99.5 kg    Examination:  General exam: Frail and ill-appearing. Respiratory system: Coarse in the base bilaterally. Respiratory effort normal. Cardiovascular system: S1 & S2 heard, RRR. No JVD, murmurs, rubs, gallops or clicks. No pedal edema. Gastrointestinal system: Abdomen is nondistended, soft and nontender. No organomegaly or masses felt. Normal bowel sounds heard. Central nervous system: Alert and oriented x3. No focal neurological deficits. Extremities: Symmetric  Skin: No rashes, lesions or ulcers Psychiatry:  Mood & affect appropriate.     Data Reviewed: I have personally reviewed following labs and imaging studies  CBC: Recent Labs  Lab 01/06/21 1305 01/07/21 0456  WBC 17.3* 19.1*  NEUTROABS 9.9*  --   HGB 15.6* 14.6  HCT 49.8* 47.3*  MCV 95.0 95.7  PLT 123* 161*   Basic Metabolic Panel: Recent Labs  Lab 01/06/21 1305 01/07/21 0456  NA 139 142  K 4.0 3.6  CL 103 107  CO2 25 25  GLUCOSE 139* 119*  BUN 20 17  CREATININE 0.89 0.78  CALCIUM 7.9* 7.6*   GFR: Estimated Creatinine Clearance: 65.6 mL/min (by C-G formula based on SCr of 0.78 mg/dL). Liver Function Tests: Recent Labs  Lab 01/07/21 0456  AST 34  ALT 17  ALKPHOS 52  BILITOT 2.7*  PROT 6.1*  ALBUMIN 2.4*   No results for input(s): LIPASE, AMYLASE in the last 168 hours. No results for input(s): AMMONIA in the last 168 hours. Coagulation Profile: Recent Labs  Lab 01/06/21 1644 01/07/21 0456  INR 1.6* 1.6*   Cardiac Enzymes: No results for input(s): CKTOTAL, CKMB, CKMBINDEX, TROPONINI in the last 168 hours. BNP (last 3 results) No results for input(s): PROBNP in the last 8760  hours. HbA1C: No results for input(s): HGBA1C in the last 72 hours. CBG: No results for input(s): GLUCAP in the last 168 hours. Lipid Profile: No results for input(s): CHOL, HDL, LDLCALC, TRIG, CHOLHDL, LDLDIRECT in the last 72 hours. Thyroid Function Tests: No results for input(s): TSH, T4TOTAL, FREET4, T3FREE, THYROIDAB in the last 72 hours. Anemia Panel: No results for input(s): VITAMINB12, FOLATE, FERRITIN, TIBC, IRON, RETICCTPCT in the last 72 hours. Sepsis Labs: Recent Labs  Lab 01/06/21 1305 01/06/21 1741 01/06/21 2020 01/07/21 0456  PROCALCITON  --   --   --  0.10  LATICACIDVEN 1.9 2.2* 1.8  --     Recent Results (from the past 240 hour(s))  Resp Panel by RT-PCR (Flu A&B, Covid) Nasopharyngeal Swab     Status: None   Collection Time: 01/06/21  1:05 PM   Specimen: Nasopharyngeal Swab; Nasopharyngeal(NP) swabs in vial transport medium  Result Value Ref Range Status   SARS Coronavirus 2 by RT  PCR NEGATIVE NEGATIVE Final    Comment: (NOTE) SARS-CoV-2 target nucleic acids are NOT DETECTED.  The SARS-CoV-2 RNA is generally detectable in upper respiratory specimens during the acute phase of infection. The lowest concentration of SARS-CoV-2 viral copies this assay can detect is 138 copies/mL. A negative result does not preclude SARS-Cov-2 infection and should not be used as the sole basis for treatment or other patient management decisions. A negative result may occur with  improper specimen collection/handling, submission of specimen other than nasopharyngeal swab, presence of viral mutation(s) within the areas targeted by this assay, and inadequate number of viral copies(<138 copies/mL). A negative result must be combined with clinical observations, patient history, and epidemiological information. The expected result is Negative.  Fact Sheet for Patients:  EntrepreneurPulse.com.au  Fact Sheet for Healthcare Providers:   IncredibleEmployment.be  This test is no t yet approved or cleared by the [place] FDA and  has been authorized for detection and/or diagnosis of SARS-CoV-2 by FDA under an Emergency Use Authorization (EUA). This EUA will remain  in effect (meaning this test can be used) for the duration of the COVID-19 declaration under Section 564(b)(1) of the Act, 21 U.S.C.section 360bbb-3(b)(1), unless the authorization is terminated  or revoked sooner.       Influenza A by PCR NEGATIVE NEGATIVE Final   Influenza B by PCR NEGATIVE NEGATIVE Final    Comment: (NOTE) The Xpert Xpress SARS-CoV-2/FLU/RSV plus assay is intended as an aid in the diagnosis of influenza from Nasopharyngeal swab specimens and should not be used as a sole basis for treatment. Nasal washings and aspirates are unacceptable for Xpert Xpress SARS-CoV-2/FLU/RSV testing.  Fact Sheet for Patients: EntrepreneurPulse.com.au  Fact Sheet for Healthcare Providers: IncredibleEmployment.be  This test is not yet approved or cleared by the [place] FDA and has been authorized for detection and/or diagnosis of SARS-CoV-2 by FDA under an Emergency Use Authorization (EUA). This EUA will remain in effect (meaning this test can be used) for the duration of the COVID-19 declaration under Section 564(b)(1) of the Act, 21 U.S.C. section 360bbb-3(b)(1), unless the authorization is terminated or revoked.  Performed at [facility], [address] [place], [place] [address]   Culture, blood (routine x 2)     Status: None (Preliminary result)   Collection Time: 01/06/21  3:00 PM   Specimen: BLOOD  Result Value Ref Range Status   Specimen Description BLOOD  LEFT AC  Final   Special Requests   Final    BOTTLES DRAWN AEROBIC AND ANAEROBIC Blood Culture results may not be optimal due to an inadequate volume of blood received in culture bottles   Culture   Final    NO  GROWTH < 24 HOURS Performed at [facility], [address], [place], [place] [address]    Report Status PENDING  Incomplete  Culture, blood (routine x 2)     Status: None (Preliminary result)   Collection Time: 01/06/21  3:00 PM   Specimen: BLOOD  Result Value Ref Range Status   Specimen Description BLOOD  RIGHT AC  Final   Special Requests   Final    BOTTLES DRAWN AEROBIC AND ANAEROBIC Blood Culture results may not be optimal due to an inadequate volume of blood received in culture bottles   Culture   Final    NO GROWTH < 24 HOURS Performed at [facility], [address] [place], [place] [address]    Report Status PENDING  Incomplete  Radiology Studies: CT Angio Chest PE W and/or Wo Contrast  Addendum Date: 01/06/2021   ADDENDUM REPORT: 01/06/2021 16:38 ADDENDUM: Of note, in the CTA CHEST FINDINGS section, under the Cardiovascular subsection, the original report incorrectly states that there is no pericardial effusion. This section should read that there is a small pericardial effusion. The right report is otherwise unchanged. Electronically Signed   By: Zerita Boers M.D.   On: 01/06/2021 16:38   Result Date: 01/06/2021 CLINICAL DATA:  Increased shortness of breath for 2 weeks, concern for pulmonary embolism and metastatic disease. EXAM: CT ANGIOGRAPHY CHEST CT ABDOMEN AND PELVIS WITH CONTRAST TECHNIQUE: Multidetector CT imaging of the chest was performed using the standard protocol during bolus administration of intravenous contrast. Multiplanar CT image reconstructions and MIPs were obtained to evaluate the vascular anatomy. Multidetector CT imaging of the abdomen and pelvis was performed using the standard protocol during bolus administration of intravenous contrast. CONTRAST:  65m OMNIPAQUE IOHEXOL 350 MG/ML SOLN COMPARISON:  Same day chest radiograph and CT abdomen dated 11/20/2007. FINDINGS: CTA CHEST FINDINGS Cardiovascular: Satisfactory  opacification of the pulmonary arteries to the segmental level. Multiple filling defects in segmental and subsegmental pulmonary arteries supplying the right upper lobe and left upper lobe are consistent with pulmonary emboli. The main pulmonary artery is enlarged, measuring 3.3 cm in diameter, suggestive of pulmonary hypertension. Vascular calcifications are seen in the coronary arteries and aortic arch. Normal heart size. No pericardial effusion. Mediastinum/Nodes: Bilateral hilar and mediastinal adenopathy is noted. For reference, a right paratracheal lymph node measures 1.1 cm short axis (series 5, image 26). No enlarged axillary lymph nodes. A left thyroid nodule measures 2.9 x 1.5 cm. The trachea and esophagus demonstrate no significant findings. Lungs/Pleura: Severe diffuse bilateral ground-glass opacities and septal line thickening is seen. There is a small left and trace right pleural effusion with associated atelectasis. There is no pneumothorax. Musculoskeletal: No chest wall abnormality. No acute or significant osseous findings. Review of the MIP images confirms the above findings. CT ABDOMEN and PELVIS FINDINGS Hepatobiliary: An 8 mm cyst is seen in the right hepatic lobe. No gallstones, gallbladder wall thickening, or biliary dilatation. Pancreas: Unremarkable. No pancreatic ductal dilatation or surrounding inflammatory changes. Spleen: Normal in size without focal abnormality. Adrenals/Urinary Tract: Adrenal glands are unremarkable. Other than a 2 cm cyst in the left kidney, the kidneys are normal, without renal calculi, focal lesion, or hydronephrosis. Bladder is unremarkable. Stomach/Bowel: There is a small hiatal hernia. Appendix appears normal. No evidence of bowel wall thickening, distention, or inflammatory changes. Vascular/Lymphatic: Aortic atherosclerosis. No enlarged abdominal or pelvic lymph nodes. Reproductive: Uterus and bilateral adnexa are unremarkable. Other: There is a fat containing  ventral abdominal wall hernia near the umbilicus. No free intraperitoneal fluid. Musculoskeletal: Degenerative changes are seen in the spine. Review of the MIP images confirms the above findings. IMPRESSION: 1. Multiple filling defects in segmental and subsegmental pulmonary arteries supplying the right upper lobe and left upper lobe, consistent with pulmonary emboli. 2. Severe diffuse bilateral ground-glass opacities and septal line thickening likely represent pulmonary edema/pneumonia. Small left and trace right pleural effusions with associated atelectasis. The previously questioned masslike opacity overlying the right mid and lower lung likely reflected the right atrial silhouette. 3. Bilateral hilar and mediastinal adenopathy is likely reactive. 4. Left thyroid nodule measures 2.9 x 1.5 cm. Recommend thyroid [place](ref: J Am Coll Radiol. 2015 Feb;12(2): 143-50). 5. No acute process in the abdomen or pelvis. Aortic Atherosclerosis (ICD10-I70.0). These results were called by  telephone at the time of interpretation on 01/06/2021 at 4:10 pm to provider Dr. Arta Silence, who verbally acknowledged these results. Electronically Signed: By: Zerita Boers M.D. On: 01/06/2021 16:11   CT ABDOMEN PELVIS W CONTRAST  Addendum Date: 01/06/2021   ADDENDUM REPORT: 01/06/2021 16:38 ADDENDUM: Of note, in the CTA CHEST FINDINGS section, under the Cardiovascular subsection, the original report incorrectly states that there is no pericardial effusion. This section should read that there is a small pericardial effusion. The right report is otherwise unchanged. Electronically Signed   By: Zerita Boers M.D.   On: 01/06/2021 16:38   Result Date: 01/06/2021 CLINICAL DATA:  Increased shortness of breath for 2 weeks, concern for pulmonary embolism and metastatic disease. EXAM: CT ANGIOGRAPHY CHEST CT ABDOMEN AND PELVIS WITH CONTRAST TECHNIQUE: Multidetector CT imaging of the chest was performed using the standard protocol during  bolus administration of intravenous contrast. Multiplanar CT image reconstructions and MIPs were obtained to evaluate the vascular anatomy. Multidetector CT imaging of the abdomen and pelvis was performed using the standard protocol during bolus administration of intravenous contrast. CONTRAST:  55m OMNIPAQUE IOHEXOL 350 MG/ML SOLN COMPARISON:  Same day chest radiograph and CT abdomen dated 11/20/2007. FINDINGS: CTA CHEST FINDINGS Cardiovascular: Satisfactory opacification of the pulmonary arteries to the segmental level. Multiple filling defects in segmental and subsegmental pulmonary arteries supplying the right upper lobe and left upper lobe are consistent with pulmonary emboli. The main pulmonary artery is enlarged, measuring 3.3 cm in diameter, suggestive of pulmonary hypertension. Vascular calcifications are seen in the coronary arteries and aortic arch. Normal heart size. No pericardial effusion. Mediastinum/Nodes: Bilateral hilar and mediastinal adenopathy is noted. For reference, a right paratracheal lymph node measures 1.1 cm short axis (series 5, image 26). No enlarged axillary lymph nodes. A left thyroid nodule measures 2.9 x 1.5 cm. The trachea and esophagus demonstrate no significant findings. Lungs/Pleura: Severe diffuse bilateral ground-glass opacities and septal line thickening is seen. There is a small left and trace right pleural effusion with associated atelectasis. There is no pneumothorax. Musculoskeletal: No chest wall abnormality. No acute or significant osseous findings. Review of the MIP images confirms the above findings. CT ABDOMEN and PELVIS FINDINGS Hepatobiliary: An 8 mm cyst is seen in the right hepatic lobe. No gallstones, gallbladder wall thickening, or biliary dilatation. Pancreas: Unremarkable. No pancreatic ductal dilatation or surrounding inflammatory changes. Spleen: Normal in size without focal abnormality. Adrenals/Urinary Tract: Adrenal glands are unremarkable. Other than a  2 cm cyst in the left kidney, the kidneys are normal, without renal calculi, focal lesion, or hydronephrosis. Bladder is unremarkable. Stomach/Bowel: There is a small hiatal hernia. Appendix appears normal. No evidence of bowel wall thickening, distention, or inflammatory changes. Vascular/Lymphatic: Aortic atherosclerosis. No enlarged abdominal or pelvic lymph nodes. Reproductive: Uterus and bilateral adnexa are unremarkable. Other: There is a fat containing ventral abdominal wall hernia near the umbilicus. No free intraperitoneal fluid. Musculoskeletal: Degenerative changes are seen in the spine. Review of the MIP images confirms the above findings. IMPRESSION: 1. Multiple filling defects in segmental and subsegmental pulmonary arteries supplying the right upper lobe and left upper lobe, consistent with pulmonary emboli. 2. Severe diffuse bilateral ground-glass opacities and septal line thickening likely represent pulmonary edema/pneumonia. Small left and trace right pleural effusions with associated atelectasis. The previously questioned masslike opacity overlying the right mid and lower lung likely reflected the right atrial silhouette. 3. Bilateral hilar and mediastinal adenopathy is likely reactive. 4. Left thyroid nodule measures 2.9 x 1.5 cm.  Recommend thyroid US (ref: J Am Coll Radiol. 2015 Feb;12(2): 143-50). 5. No acute process in the abdomen or pelvis. Aortic Atherosclerosis (ICD10-I70.0). These results were called by telephone at the time of interpretation on 01/06/2021 at 4:10 pm to provider Dr. Arta Silence, who verbally acknowledged these results. Electronically Signed: By: Zerita Boers M.D. On: 01/06/2021 16:11   US Venous Img Lower Bilateral (DVT)  Result Date: 01/07/2021 CLINICAL DATA:  Pulmonary embolism. EXAM: BILATERAL LOWER EXTREMITY VENOUS DOPPLER ULTRASOUND TECHNIQUE: Gray-scale sonography with compression, as well as color and duplex ultrasound, were performed to evaluate the deep  venous system(s) from the level of the common femoral vein through the popliteal and proximal calf veins. COMPARISON:  None. FINDINGS: VENOUS On the right, hypoechoic thrombus in a noncompressible peroneal vein. Normal compressibility of the common femoral, superficial femoral, and popliteal veins. Visualized portions of profunda femoral vein and great saphenous vein unremarkable. No filling defects to suggest DVT on grayscale or color Doppler imaging. On the left, Normal compressibility of the common femoral, superficial femoral, and popliteal veins, as well as the visualized calf veins. Visualized portions of profunda femoral vein and great saphenous vein unremarkable. No filling defects to suggest DVT on grayscale or color Doppler imaging. Doppler waveforms show normal direction of venous flow, normal respiratory phasicity and response to augmentation. OTHER None. Limitations: none IMPRESSION: 1. POSITIVE for isolated right peroneal (calf) DVT. 2. Negative for left lower extremity DVT. Electronically Signed   By: Lucrezia Europe M.D.   On: 01/07/2021 08:03   DG Chest Portable 1 View  Result Date: 01/06/2021 CLINICAL DATA:  Shortness of breath EXAM: PORTABLE CHEST 1 VIEW COMPARISON:  07/06/2014 FINDINGS: Heart size appears within normal limits. Rounded mass-like opacity within the right mid to lower lung field measuring approximately 6.4 cm in diameter. Diffuse interstitial opacities throughout both lungs. Small right pleural effusion. No pneumothorax. IMPRESSION: 1. Rounded mass-like opacity within the right mid to lower lung field measuring approximately 6.4 cm in diameter. Further evaluation with contrast-enhanced CT of the chest is recommended. 2. Extensive bilateral interstitial opacities which may reflect pulmonary edema or possibly atypical/viral infection. 3. Small right pleural effusion. Electronically Signed   By: Davina Poke D.O.   On: 01/06/2021 14:12        Scheduled Meds: . furosemide  40  mg Intravenous Q12H  . gabapentin  300 mg Oral TID  . methylPREDNISolone (SOLU-MEDROL) injection  60 mg Intravenous Q12H  . pravastatin  10 mg Oral Daily   Continuous Infusions: . sodium chloride Stopped (01/07/21 0439)  . azithromycin    . cefTRIAXone (ROCEPHIN)  IV    . heparin 1,400 Units/hr (01/07/21 0552)     LOS: 0 days    Time spent: 40 minutes    Sharen Hones, MD [facility]   To contact the attending provider between 7A-7P or the covering provider during after hours 7P-7A, please log into the web site [email or web address] and access using universal [place] password for that web site. If you do not have the password, please call the hospital operator.  01/07/2021, 12:07 PM

## 2021-01-08 ENCOUNTER — Inpatient Hospital Stay: Payer: Medicare Other

## 2021-01-08 ENCOUNTER — Inpatient Hospital Stay (HOSPITAL_COMMUNITY)
Admit: 2021-01-08 | Discharge: 2021-01-08 | Disposition: A | Discharge status: Home / Self Care | Payer: Medicare Other | Attending: Internal Medicine | Admitting: Internal Medicine

## 2021-01-08 DIAGNOSIS — I10 Essential (primary) hypertension: Secondary | ICD-10-CM

## 2021-01-08 DIAGNOSIS — U071 COVID-19: Secondary | ICD-10-CM | POA: Diagnosis not present

## 2021-01-08 DIAGNOSIS — I2699 Other pulmonary embolism without acute cor pulmonale: Secondary | ICD-10-CM

## 2021-01-08 DIAGNOSIS — J189 Pneumonia, unspecified organism: Secondary | ICD-10-CM

## 2021-01-08 DIAGNOSIS — R918 Other nonspecific abnormal finding of lung field: Secondary | ICD-10-CM

## 2021-01-08 DIAGNOSIS — J9601 Acute respiratory failure with hypoxia: Secondary | ICD-10-CM

## 2021-01-08 DIAGNOSIS — M797 Fibromyalgia: Secondary | ICD-10-CM | POA: Diagnosis not present

## 2021-01-08 LAB — SAR COV2 SEROLOGY (COVID19)AB(IGG),IA: SARS-CoV-2 Ab, IgG: NONREACTIVE

## 2021-01-08 LAB — CBC WITH DIFFERENTIAL/PLATELET
Abs Immature Granulocytes: 0.35 10*3/uL — ABNORMAL HIGH (ref 0.00–0.07)
Basophils Absolute: 0 10*3/uL (ref 0.0–0.1)
Basophils Relative: 0 %
Eosinophils Absolute: 0.1 10*3/uL (ref 0.0–0.5)
Eosinophils Relative: 0 %
HCT: 49.2 % — ABNORMAL HIGH (ref 36.0–46.0)
Hemoglobin: 15.4 g/dL — ABNORMAL HIGH (ref 12.0–15.0)
Immature Granulocytes: 3 %
Lymphocytes Relative: 5 %
Lymphs Abs: 0.7 10*3/uL (ref 0.7–4.0)
MCH: 29.9 pg (ref 26.0–34.0)
MCHC: 31.3 g/dL (ref 30.0–36.0)
MCV: 95.5 fL (ref 80.0–100.0)
Monocytes Absolute: 1.5 10*3/uL — ABNORMAL HIGH (ref 0.1–1.0)
Monocytes Relative: 12 %
Neutro Abs: 10.3 10*3/uL — ABNORMAL HIGH (ref 1.7–7.7)
Neutrophils Relative %: 80 %
Platelets: 118 10*3/uL — ABNORMAL LOW (ref 150–400)
RBC: 5.15 MIL/uL — ABNORMAL HIGH (ref 3.87–5.11)
RDW: 18.6 % — ABNORMAL HIGH (ref 11.5–15.5)
WBC: 12.9 10*3/uL — ABNORMAL HIGH (ref 4.0–10.5)
nRBC: 0.9 % — ABNORMAL HIGH (ref 0.0–0.2)

## 2021-01-08 LAB — ECHOCARDIOGRAM COMPLETE
AR max vel: 2.78 cm2
AV Area VTI: 3.45 cm2
AV Area mean vel: 2.71 cm2
AV Mean grad: 3 mmHg
AV Peak grad: 5.9 mmHg
Ao pk vel: 1.21 m/s
Area-P 1/2: 2.74 cm2
Height: 66 in
S' Lateral: 2.49 cm
Weight: 3510.4 oz

## 2021-01-08 LAB — MAGNESIUM: Magnesium: 2 mg/dL (ref 1.7–2.4)

## 2021-01-08 LAB — BASIC METABOLIC PANEL
Anion gap: 12 (ref 5–15)
BUN: 22 mg/dL (ref 8–23)
CO2: 28 mmol/L (ref 22–32)
Calcium: 7.6 mg/dL — ABNORMAL LOW (ref 8.9–10.3)
Chloride: 99 mmol/L (ref 98–111)
Creatinine, Ser: 0.8 mg/dL (ref 0.44–1.00)
GFR, Estimated: >60 mL/min (ref >60)
Glucose, Bld: 166 mg/dL — ABNORMAL HIGH (ref 70–99)
Potassium: 3.4 mmol/L — ABNORMAL LOW (ref 3.5–5.1)
Sodium: 139 mmol/L (ref 135–145)

## 2021-01-08 LAB — HEPARIN LEVEL (UNFRACTIONATED): Heparin Unfractionated: 0.49 IU/mL (ref 0.30–0.70)

## 2021-01-08 LAB — C-REACTIVE PROTEIN: CRP: 24.6 mg/dL — ABNORMAL HIGH (ref <1.0)

## 2021-01-08 MED ORDER — ADULT MULTIVITAMIN W/MINERALS CH
1.0000 | ORAL_TABLET | Freq: Every day | ORAL | Status: DC
  Administered 2021-01-08 – 2021-01-23 (×16): 1 via ORAL
  Filled 2021-01-08 (×16): qty 1

## 2021-01-08 MED ORDER — ENSURE ENLIVE PO LIQD
237.0000 mL | Freq: Three times a day (TID) | ORAL | Status: DC
  Administered 2021-01-09 – 2021-01-15 (×15): 237 mL via ORAL

## 2021-01-08 MED ORDER — TRAMADOL HCL 50 MG PO TABS
50.0000 mg | ORAL_TABLET | Freq: Three times a day (TID) | ORAL | Status: AC | PRN
  Administered 2021-01-08 – 2021-01-10 (×5): 50 mg via ORAL
  Filled 2021-01-08 (×5): qty 1

## 2021-01-08 NOTE — Progress Notes (Signed)
*  PRELIMINARY RESULTS* Echocardiogram 2D Echocardiogram has been performed.  Elizabeth Rowland 01/08/2021, 10:14 AM

## 2021-01-08 NOTE — Consult Note (Signed)
ANTICOAGULATION CONSULT NOTE  Pharmacy Consult for heparin infusion Indication: pulmonary embolus  Not on File  Patient Measurements: Height: 5\' 6"  (167.6 cm) Weight: 99.5 kg (219 lb 6.4 oz) IBW/kg (Calculated) : 59.3 Heparin Dosing Weight: 83.9 kg  Vital Signs: Temp: 98.7 F (37.1 C) (04/24 2054) Temp Source: Oral (04/24 2054) BP: 155/58 (04/24 2054) Pulse Rate: 92 (04/24 2054)  Labs: Recent Labs    01/06/21 1305 01/06/21 1644 01/07/21 0456 01/07/21 1140 01/07/21 2238 01/08/21 0706  HGB 15.6*  --  14.6  --   --  15.4*  HCT 49.8*  --  47.3*  --   --  49.2*  PLT 123*  --  120*  --   --  118*  LABPROT  --  18.6* 18.7*  --   --   --   INR  --  1.6* 1.6*  --   --   --   HEPARINUNFRC  --   --   --  0.28* 0.47 0.49  CREATININE 0.89  --  0.78  --   --  0.80  TROPONINIHS 20*  19*  --   --   --   --   --     Estimated Creatinine Clearance: 65.6 mL/min (by C-G formula based on SCr of 0.8 mg/dL).   Medical History: History reviewed. No pertinent past medical history.  Medications:   enoxaparin 50 mg SQ x 1 given 04/23 1600 (DVT ppx)  Assessment: 80 y.o. female presents to the emergency department for chief concerns of shortness of breath. CT exam showing bilateral segmental and subsegmental PE. Pharmacy has been consulted for initiation and management of heparin infusion.  Labs: INR 1.6>1.6; plts 123>118; Hgb 14.6>15.4   Goal of Therapy:  Heparin level 0.3-0.7 units/ml Monitor platelets by anticoagulation protocol: Yes   Date Time HL Rate/Comment 4/24 1140 0.28 Subthera; 1400>1550 un/hr 4/24 2238 0.47 therapeutic x 1; 1550 un/hr 4/25 0706 0.49 Therapeutic x2; 1550 un/hr  Plan:  Continue heparin infusion at rate of 1550 units/hr Recheck HL daily or q8h after any rate change. Continue to monitor H&H and platelets daily   5/25, [facility] 01/08/2021 7:59 AM

## 2021-01-08 NOTE — Progress Notes (Addendum)
[facility] Daily Progress Note  Subjective: Patient is resting comfortably in bed this AM with son at bedside.  No issues overnight.  Still requiring high flow nasal cannula.  Objective: Vitals:   01/07/21 2054 01/08/21 0800 01/08/21 0846 01/08/21 1221  BP: (!) 155/58   (!) 130/56  Pulse: 92   95  Resp: (!) 22   20  Temp: 98.7 F (37.1 C)   98 F (36.7 C)  TempSrc: Oral   Oral  SpO2: 90% (!) 88% 95% 94%  Weight:      Height:        Intake/Output Summary (Last 24 hours) at 01/08/2021 1249 Last data filed at 01/08/2021 0620 Gross per 24 hour  Intake 1290.74 ml  Output 3100 ml  Net -1809.26 ml   Physical Exam: A&Ox3, NAD CV: RRR Pulmonary: High flow nasal cannula.  Respiratory effort normal.  Decreased bilaterally. Abdomen: Soft, Nontender, Nondistended Vascular:   Right lower extremity: Thigh soft.  Calf soft.  Extremities warm distally to toes.  There is no acute vascular compromise noted to the extremity at this time.  Motor/sensory is intact.  Good capillary refill.  Mild to moderate edema.   Laboratory: CBC    Component Value Date/Time   WBC 12.9 (H) 01/08/2021 0706   HGB 15.4 (H) 01/08/2021 0706   HCT 49.2 (H) 01/08/2021 0706   PLT 118 (L) 01/08/2021 0706   BMET    Component Value Date/Time   NA 139 01/08/2021 0706   K 3.4 (L) 01/08/2021 0706   CL 99 01/08/2021 0706   CO2 28 01/08/2021 0706   GLUCOSE 166 (H) 01/08/2021 0706   BUN 22 01/08/2021 0706   CREATININE 0.80 01/08/2021 0706   CALCIUM 7.6 (L) 01/08/2021 0706   GFRNONAA >60 01/08/2021 0706   Assessment/Planning: The patient is an 80 year old female who presented to the [facility] emergency department with progressively worsening shortness of breath found to have PE and extensive groundglass opacities as well as was interstitial changes within the lung  1) Reviewed imaging with Dr. Wyn Quaker. Clot burden is very small without right heart strain unsure if moving  forward with a pulmonary thrombectomy / thrombolysis will be of any benefit in regard to the patient's pulmonary symptoms. 2) COVID-19 pneumonia with severe sepsis most likely the cause of the patient's progressively worsening shortness of breath presenting with acute hypoxic respiratory failure. 3) We do have a 2-week window to be considered a pulmonary intervention when the patient has been treated for COVID-19 pneumonia/sepsis.  4) The plan was discussed in detail with the patient and her son who was at the bedside. Both are in agreement.  Reviewed imaging / discussed with Dr. Wallis Mart [facility] PA-C 01/08/2021 12:49 PM

## 2021-01-08 NOTE — Progress Notes (Signed)
Initial Nutrition Assessment  DOCUMENTATION CODES:   Obesity unspecified  INTERVENTION:   -Liberalize diet to regular -Ensure Enlive po TID, each supplement provides 350 kcal and 20 grams of protein -MVI with minerals daily  NUTRITION DIAGNOSIS:   Inadequate oral intake related to poor appetite as evidenced by meal completion < 25%,per patient/family report.  GOAL:   Patient will meet greater than or equal to 90% of their needs  MONITOR:   PO intake,Supplement acceptance,Labs,Weight trends,Skin,I & O's  REASON FOR ASSESSMENT:   Rounds    ASSESSMENT:   Elizabeth Rowland is a 80 y.o. female with medical history significant for fibromyalgia, history of hypertension, history of hyperlipidemia, currently on tramadol and gabapentin for chronic pain and fibromyalgia, no longer taking antihypertensives for pravastatin, presents to the emergency department for chief concerns of shortness of breath.  Pt admitted with acute respiratory failure with hypoxia.   4/23- per radiology, pt with bilateral PE  Reviewed I/O's: -2.2 L x 24 hours and -1.2 L since admission  UOP: 3.6 L x 24 hours  Case discussed with food service director, who reports nursing staff is requesting oral nutrition supplements secondary to poor oral intake.   Spoke with pt and son at bedside. Per pt son, pt with decreased appetite over the past 3 weeks. Pt explains that she recently had COVID, which caused her to have a bitter taste in her mouth, which made eating very unappealing. Son shares that she often consumed "nibbles and sips" over the past week. Prior to COVID, pt with very hearty appetite and consumed 3 meals per day.   Noted meal completion 25%. Per pt, she consumed "a few bites" of breakfast. She shares she no longer has the bitter taste in her mouth and was a snacking on peanut butter crackers at time of visit. Son was thinking about purchasing Ensure supplements due to prolonged oral intake.   Pt  unsure of UBW or if she has lost weight.   Discussed importance of good meal and supplement intake to promote healing. Pt amenable to Ensure supplements.   Medications reviewed and include lasix and solu-medrol.    Labs reviewed: K: 3.4.  NUTRITION - FOCUSED PHYSICAL EXAM:  Flowsheet Row Most Recent Value  Orbital Region No depletion  Upper Arm Region No depletion  Thoracic and Lumbar Region No depletion  Buccal Region No depletion  Temple Region No depletion  Clavicle Bone Region No depletion  Clavicle and Acromion Bone Region No depletion  Scapular Bone Region No depletion  Dorsal Hand No depletion  Patellar Region No depletion  Anterior Thigh Region No depletion  Posterior Calf Region No depletion  Edema (RD Assessment) Mild  Hair Reviewed  Eyes Reviewed  Mouth Reviewed  Skin Reviewed  Nails Reviewed       Diet Order:   Diet Order            Diet regular Room service appropriate? Yes with Assist; Fluid consistency: Thin  Diet effective now                 EDUCATION NEEDS:   Education needs have been addressed  Skin:  Skin Assessment: Reviewed RN Assessment  Last BM:  01/05/21  Height:   Ht Readings from Last 1 Encounters:  01/06/21 5\' 6"  (1.676 m)    Weight:   Wt Readings from Last 1 Encounters:  01/06/21 99.5 kg    Ideal Body Weight:  59.1 kg  BMI:  Body mass index is 35.41 kg/m.  Estimated Nutritional Needs:   Kcal:  1600-1800  Protein:  90-105 grams  Fluid:  > 1.6 L    Levada Schilling, RD, LDN, CDCES Registered Dietitian II Certified Diabetes Care and Education Specialist Please refer to [facility] for RD and/or RD on-call/weekend/after hours pager

## 2021-01-08 NOTE — Progress Notes (Signed)
PROGRESS NOTE    Elizabeth Rowland  ZOX:[id number] DOB: [birth year] DOA: 01/06/2021 PCP: Marguerita Merles, MD   Chief complaint.  Shortness of breath. Brief Narrative:  Elizabeth Rowland is a 80 y.o. female with medical history significant for fibromyalgia, history of hypertension, history of hyperlipidemia, currently on tramadol and gabapentin for chronic pain and fibromyalgia, no longer taking antihypertensives for pravastatin, presents to the emergency department for chief concerns of shortness of breath.  Symptoms started 3 weeks ago. In the emergency room, patient was found to have acute respiratory failure, placed on 10 L oxygen.  She also met severe sepsis criteria. CT angiogram showed multiple PE with extensive groundglass opacities as well as interstitial changes. Patient was not vaccinated for COVID-19. Is placed on antibiotics with Zithromax and Rocephin. COVID PCR is negative, IgG is negative-capsid IgM was  Ordered. Small PE burden-there is no need for thrombectomy or embolectomy-concern of underlying interstitial lung disease.   Assessment & Plan:   Active Problems:   Severe sepsis with acute organ dysfunction (HCC)   Hyperlipidemia   Fibromyalgia   Essential hypertension   Acute hypoxemic respiratory failure (HCC)   Acute respiratory distress syndrome (ARDS) due to COVID-19 virus ([place])   Pulmonary emboli (HCC)   Acute hypoxemic respiratory failure. ARDS. Presumed COVID-19 infection. Acute pulmonary emboli. Severe sepsis criteria Patient met severe sepsis criteria, could be due to COVID.  No evidence of bacterial infection, procalcitonin level less than 0.1. Patient had no vaccination for COVID, present with 3 weeks symptoms of shortness of breath.  I personally reviewed patient CT scan, consistent with ARDS due to COVID-19 infection. Current COVID PCR negative, I will obtain COVID antibody test.  Patient probably will be positive for COVID-19 IgM. Patient has a  severe acute respiratory failure with extensive groundglass changes in the lungs, prognosis is guarded.  COVID IgG is negative-pending COVID IgM Patient has mild elevation of BNP, echocardiogram with normal EF and grade 1 diastolic dysfunction. She was transition to heated high flow overnight -Pulmonary consult -Continue with steroid -Continue with Zithromax -Continue with IV Lasix  Bilateral PE/right popliteal vein DVT.  Vascular surgery was consulted due to worsening hypoxia.  Per vascular surgery patient has a very small burden of PE and there are not recommending any procedure at this time. -Continue heparin infusion-we will transition to [place] from tomorrow if remains stable.   Elevated troponin.  Secondary to demand ischemia from acute respiratory failure. Follow.    Lung mass. Identified on the initial chest x-ray, CT scan did not show up due to extensive chronic changes.  Need to follow-up as outpatient.  Thyroid nodule. -Outpatient follow-up.  Thrombocytopenia. Likely secondary to PE or recent viral infection.  No sign of bleeding.  -Continue to monitor   DVT prophylaxis: Heparin drip Code Status: Full Family Communication: Son was updated at bedside. Disposition Plan:  .   Status is: Inpatient  Dispo: The patient is from: Home              Anticipated d/c is to: Home              Patient currently is not medically stable to d/c.   Difficult to place patient No   I/O last 3 completed shifts: In: 2282.9 [P.O.:990; I.V.:1041.1; IV Piggyback:251.8] Out: 3600 [Urine:3600] Total I/O In: 240 [P.O.:240] Out: -     Consultants:   Vascular surgery  Pulmonology  Procedures: none  Antimicrobials: zithromax  Subjective: Patient appears very anxious when seen  today.  Son at bedside.  Continues to have shortness of breath.  Objective: Vitals:   01/08/21 0846 01/08/21 1221 01/08/21 1351 01/08/21 1553  BP:  (!) 130/56  (!) 133/59  Pulse:  95  88  Resp:  20  20   Temp:  98 F (36.7 C)  98.1 F (36.7 C)  TempSrc:  Oral    SpO2: 95% 94% 92%   Weight:      Height:        Intake/Output Summary (Last 24 hours) at 01/08/2021 1654 Last data filed at 01/08/2021 1406 Gross per 24 hour  Intake 750 ml  Output 1100 ml  Net -350 ml   Filed Weights   01/06/21 1301 01/06/21 1707  Weight: 100 kg 99.5 kg    Examination:  General.  Chronically ill-appearing, anxious elderly lady, in no acute distress. Pulmonary.  Lungs clear bilaterally, normal respiratory effort. CV.  Regular rate and rhythm, no JVD, rub or murmur. Abdomen.  Soft, nontender, nondistended, BS positive. CNS.  Alert and oriented .  No focal neurologic deficit. Extremities.  No edema, no cyanosis, pulses intact and symmetrical, signs of chronic venous dermatitis.   Data Reviewed: I have personally reviewed following labs and imaging studies  CBC: Recent Labs  Lab 01/06/21 1305 01/07/21 0456 01/08/21 0706  WBC 17.3* 19.1* 12.9*  NEUTROABS 9.9*  --  10.3*  HGB 15.6* 14.6 15.4*  HCT 49.8* 47.3* 49.2*  MCV 95.0 95.7 95.5  PLT 123* 120* 920*   Basic Metabolic Panel: Recent Labs  Lab 01/06/21 1305 01/07/21 0456 01/08/21 0706  NA 139 142 139  K 4.0 3.6 3.4*  CL 103 107 99  CO2 25 25 28   GLUCOSE 139* 119* 166*  BUN 20 17 22   CREATININE 0.89 0.78 0.80  CALCIUM 7.9* 7.6* 7.6*  MG  --   --  2.0   GFR: Estimated Creatinine Clearance: 65.6 mL/min (by C-G formula based on SCr of 0.8 mg/dL). Liver Function Tests: Recent Labs  Lab 01/07/21 0456  AST 34  ALT 17  ALKPHOS 52  BILITOT 2.7*  PROT 6.1*  ALBUMIN 2.4*   No results for input(s): LIPASE, AMYLASE in the last 168 hours. No results for input(s): AMMONIA in the last 168 hours. Coagulation Profile: Recent Labs  Lab 01/06/21 1644 01/07/21 0456  INR 1.6* 1.6*   Cardiac Enzymes: No results for input(s): CKTOTAL, CKMB, CKMBINDEX, TROPONINI in the last 168 hours. BNP (last 3 results) No results for input(s):  PROBNP in the last 8760 hours. HbA1C: No results for input(s): HGBA1C in the last 72 hours. CBG: No results for input(s): GLUCAP in the last 168 hours. Lipid Profile: No results for input(s): CHOL, HDL, LDLCALC, TRIG, CHOLHDL, LDLDIRECT in the last 72 hours. Thyroid Function Tests: No results for input(s): TSH, T4TOTAL, FREET4, T3FREE, THYROIDAB in the last 72 hours. Anemia Panel: No results for input(s): VITAMINB12, FOLATE, FERRITIN, TIBC, IRON, RETICCTPCT in the last 72 hours. Sepsis Labs: Recent Labs  Lab 01/06/21 1305 01/06/21 1741 01/06/21 2020 01/07/21 0456  PROCALCITON  --   --   --  0.10  LATICACIDVEN 1.9 2.2* 1.8  --     Recent Results (from the past 240 hour(s))  Resp Panel by RT-PCR (Flu A&B, Covid) Nasopharyngeal Swab     Status: None   Collection Time: 01/06/21  1:05 PM   Specimen: Nasopharyngeal Swab; Nasopharyngeal(NP) swabs in vial transport medium  Result Value Ref Range Status   SARS Coronavirus 2 by RT PCR NEGATIVE  NEGATIVE Final    Comment: (NOTE) SARS-CoV-2 target nucleic acids are NOT DETECTED.  The SARS-CoV-2 RNA is generally detectable in upper respiratory specimens during the acute phase of infection. The lowest concentration of SARS-CoV-2 viral copies this assay can detect is 138 copies/mL. A negative result does not preclude SARS-Cov-2 infection and should not be used as the sole basis for treatment or other patient management decisions. A negative result may occur with  improper specimen collection/handling, submission of specimen other than nasopharyngeal swab, presence of viral mutation(s) within the areas targeted by this assay, and inadequate number of viral copies(<138 copies/mL). A negative result must be combined with clinical observations, patient history, and epidemiological information. The expected result is Negative.  Fact Sheet for Patients:  EntrepreneurPulse.com.au  Fact Sheet for Healthcare Providers:   IncredibleEmployment.be  This test is no t yet approved or cleared by the [place] FDA and  has been authorized for detection and/or diagnosis of SARS-CoV-2 by FDA under an Emergency Use Authorization (EUA). This EUA will remain  in effect (meaning this test can be used) for the duration of the COVID-19 declaration under Section 564(b)(1) of the Act, 21 U.S.C.section 360bbb-3(b)(1), unless the authorization is terminated  or revoked sooner.       Influenza A by PCR NEGATIVE NEGATIVE Final   Influenza B by PCR NEGATIVE NEGATIVE Final    Comment: (NOTE) The Xpert Xpress SARS-CoV-2/FLU/RSV plus assay is intended as an aid in the diagnosis of influenza from Nasopharyngeal swab specimens and should not be used as a sole basis for treatment. Nasal washings and aspirates are unacceptable for Xpert Xpress SARS-CoV-2/FLU/RSV testing.  Fact Sheet for Patients: EntrepreneurPulse.com.au  Fact Sheet for Healthcare Providers: IncredibleEmployment.be  This test is not yet approved or cleared by the [place] FDA and has been authorized for detection and/or diagnosis of SARS-CoV-2 by FDA under an Emergency Use Authorization (EUA). This EUA will remain in effect (meaning this test can be used) for the duration of the COVID-19 declaration under Section 564(b)(1) of the Act, 21 U.S.C. section 360bbb-3(b)(1), unless the authorization is terminated or revoked.  Performed at [facility], [address] [place], [place] [address]   Culture, blood (routine x 2)     Status: None (Preliminary result)   Collection Time: 01/06/21  3:00 PM   Specimen: BLOOD  Result Value Ref Range Status   Specimen Description BLOOD  LEFT AC  Final   Special Requests   Final    BOTTLES DRAWN AEROBIC AND ANAEROBIC Blood Culture results may not be optimal due to an inadequate volume of blood received in culture bottles   Culture   Final    NO  GROWTH 2 DAYS Performed at [facility], [address], [place], [place] [address]    Report Status PENDING  Incomplete  Culture, blood (routine x 2)     Status: None (Preliminary result)   Collection Time: 01/06/21  3:00 PM   Specimen: BLOOD  Result Value Ref Range Status   Specimen Description BLOOD  RIGHT AC  Final   Special Requests   Final    BOTTLES DRAWN AEROBIC AND ANAEROBIC Blood Culture results may not be optimal due to an inadequate volume of blood received in culture bottles   Culture   Final    NO GROWTH 2 DAYS Performed at [facility], [address], [place], [place] [address]    Report Status PENDING  Incomplete  Respiratory (~20 pathogens) panel by PCR  Status: None   Collection Time: 01/07/21  8:03 AM   Specimen: Nasopharyngeal Swab; Respiratory  Result Value Ref Range Status   Adenovirus NOT DETECTED NOT DETECTED Final   Coronavirus 229E NOT DETECTED NOT DETECTED Final    Comment: (NOTE) The Coronavirus on the Respiratory Panel, DOES NOT test for the novel  Coronavirus (2019 nCoV)    Coronavirus HKU1 NOT DETECTED NOT DETECTED Final   Coronavirus NL63 NOT DETECTED NOT DETECTED Final   Coronavirus OC43 NOT DETECTED NOT DETECTED Final   Metapneumovirus NOT DETECTED NOT DETECTED Final   Rhinovirus / Enterovirus NOT DETECTED NOT DETECTED Final   Influenza A NOT DETECTED NOT DETECTED Final   Influenza B NOT DETECTED NOT DETECTED Final   Parainfluenza Virus 1 NOT DETECTED NOT DETECTED Final   Parainfluenza Virus 2 NOT DETECTED NOT DETECTED Final   Parainfluenza Virus 3 NOT DETECTED NOT DETECTED Final   Parainfluenza Virus 4 NOT DETECTED NOT DETECTED Final   Respiratory Syncytial Virus NOT DETECTED NOT DETECTED Final   Bordetella pertussis NOT DETECTED NOT DETECTED Final   Bordetella Parapertussis NOT DETECTED NOT DETECTED Final   Chlamydophila pneumoniae NOT DETECTED NOT DETECTED Final   Mycoplasma pneumoniae NOT DETECTED NOT DETECTED  Final    Comment: Performed at [facility], [place]. [address], [place], [place] [address]         Radiology Studies: US Venous Img Lower Bilateral (DVT)  Result Date: 01/07/2021 CLINICAL DATA:  Pulmonary embolism. EXAM: BILATERAL LOWER EXTREMITY VENOUS DOPPLER ULTRASOUND TECHNIQUE: Gray-scale sonography with compression, as well as color and duplex ultrasound, were performed to evaluate the deep venous system(s) from the level of the common femoral vein through the popliteal and proximal calf veins. COMPARISON:  None. FINDINGS: VENOUS On the right, hypoechoic thrombus in a noncompressible peroneal vein. Normal compressibility of the common femoral, superficial femoral, and popliteal veins. Visualized portions of profunda femoral vein and great saphenous vein unremarkable. No filling defects to suggest DVT on grayscale or color Doppler imaging. On the left, Normal compressibility of the common femoral, superficial femoral, and popliteal veins, as well as the visualized calf veins. Visualized portions of profunda femoral vein and great saphenous vein unremarkable. No filling defects to suggest DVT on grayscale or color Doppler imaging. Doppler waveforms show normal direction of venous flow, normal respiratory phasicity and response to augmentation. OTHER None. Limitations: none IMPRESSION: 1. POSITIVE for isolated right peroneal (calf) DVT. 2. Negative for left lower extremity DVT. Electronically Signed   By: Lucrezia Europe M.D.   On: 01/07/2021 08:03   DG CHEST PORT 1 VIEW  Result Date: 01/08/2021 CLINICAL DATA:  Increased shortness of breath. EXAM: PORTABLE CHEST 1 VIEW COMPARISON:  01/06/2021 chest radiograph and CT chest. FINDINGS: Patient is rotated. Heart is enlarged, stable. Similar to minimally increased diffuse mixed interstitial and airspace opacification. There may be a small right pleural effusion. IMPRESSION: 1. Similar to minimally increased diffuse mixed interstitial and airspace  opacification. Findings may be due to edema or viral/atypical pneumonia, including due to COVID-19. 2. Probable small right pleural effusion. Electronically Signed   By: Lorin Picket M.D.   On: 01/08/2021 08:54   ECHOCARDIOGRAM COMPLETE  Result Date: 01/08/2021    ECHOCARDIOGRAM REPORT   Patient Name:   EVENY ANASTAS Mccathern Date of Exam: 01/08/2021 Medical Rec #:  [id number]           Height:       66.0 in Accession #:    [id number]  Weight:       219.4 lb Date of Birth:  1941-04-14          BSA:          2.080 m Patient Age:    69 years            BP:           158/58 mmHg Patient Gender: F                   HR:           92 bpm. Exam Location:  [facility] Procedure: 2D Echo, Cardiac Doppler and Color Doppler Indications:     Pulmonary embolus I26.09  History:         Patient has no prior history of Echocardiogram examinations. No                  medical history on file.  Sonographer:     Sherrie Sport RDCS (AE) Referring Phys:  [id number] AMY N COX Diagnosing Phys: Kathlyn Sacramento MD  Sonographer Comments: Technically challenging study due to limited acoustic windows and suboptimal apical window. IMPRESSIONS  1. Left ventricular ejection fraction, by estimation, is 55 to 60%. The left ventricle has normal function. Left ventricular endocardial border not optimally defined to evaluate regional wall motion. Left ventricular diastolic parameters are consistent with Grade I diastolic dysfunction (impaired relaxation).  2. Right ventricular systolic function is normal. The right ventricular size is normal. Tricuspid regurgitation signal is inadequate for assessing PA pressure.  3. Left atrial size was mildly dilated.  4. Right atrial size was mildly dilated.  5. The mitral valve is normal in structure. No evidence of mitral valve regurgitation. No evidence of mitral stenosis.  6. The aortic valve is normal in structure. Aortic valve regurgitation is not visualized. No aortic stenosis is present. FINDINGS  Left  Ventricle: Left ventricular ejection fraction, by estimation, is 55 to 60%. The left ventricle has normal function. Left ventricular endocardial border not optimally defined to evaluate regional wall motion. The left ventricular internal cavity size was normal in size. There is no left ventricular hypertrophy. Left ventricular diastolic parameters are consistent with Grade I diastolic dysfunction (impaired relaxation). Right Ventricle: The right ventricular size is normal. No increase in right ventricular wall thickness. Right ventricular systolic function is normal. Tricuspid regurgitation signal is inadequate for assessing PA pressure. Left Atrium: Left atrial size was mildly dilated. Right Atrium: Right atrial size was mildly dilated. Pericardium: There is no evidence of pericardial effusion. Mitral Valve: The mitral valve is normal in structure. No evidence of mitral valve regurgitation. No evidence of mitral valve stenosis. Tricuspid Valve: The tricuspid valve is normal in structure. Tricuspid valve regurgitation is not demonstrated. No evidence of tricuspid stenosis. Aortic Valve: The aortic valve is normal in structure. Aortic valve regurgitation is not visualized. No aortic stenosis is present. Aortic valve mean gradient measures 3.0 mmHg. Aortic valve peak gradient measures 5.9 mmHg. Aortic valve area, by VTI measures 3.45 cm. Pulmonic Valve: The pulmonic valve was normal in structure. Pulmonic valve regurgitation is not visualized. No evidence of pulmonic stenosis. Aorta: The aortic root is normal in size and structure. Venous: The inferior vena cava was not well visualized. IAS/Shunts: No atrial level shunt detected by color flow Doppler.  LEFT VENTRICLE PLAX 2D LVIDd:         3.36 cm  Diastology LVIDs:         2.49 cm  LV e'  medial:    4.46 cm/s LV PW:         1.08 cm  LV E/e' medial:  20.6 LV IVS:        0.95 cm  LV e' lateral:   6.74 cm/s LVOT diam:     2.00 cm  LV E/e' lateral: 13.6 LV SV:         78  LV SV Index:   37 LVOT Area:     3.14 cm  RIGHT VENTRICLE RV Basal diam:  3.98 cm RV S prime:     12.40 cm/s LEFT ATRIUM             Index       RIGHT ATRIUM           Index LA diam:        4.00 cm 1.92 cm/m  RA Area:     23.30 cm LA Vol (A2C):   54.5 ml 26.20 ml/m RA Volume:   78.60 ml  37.79 ml/m LA Vol (A4C):   56.6 ml 27.21 ml/m LA Biplane Vol: 55.3 ml 26.59 ml/m  AORTIC VALVE                   PULMONIC VALVE AV Area (Vmax):    2.78 cm    PV Vmax:        0.60 m/s AV Area (Vmean):   2.71 cm    PV Peak grad:   1.4 mmHg AV Area (VTI):     3.45 cm    RVOT Peak grad: 4 mmHg AV Vmax:           121.00 cm/s AV Vmean:          86.600 cm/s AV VTI:            0.226 m AV Peak Grad:      5.9 mmHg AV Mean Grad:      3.0 mmHg LVOT Vmax:         107.00 cm/s LVOT Vmean:        74.600 cm/s LVOT VTI:          0.248 m LVOT/AV VTI ratio: 1.10  AORTA Ao Root diam: 2.30 cm MITRAL VALVE                TRICUSPID VALVE MV Area (PHT): 2.74 cm     TR Peak grad:   13.7 mmHg MV Decel Time: 277 msec     TR Vmax:        185.00 cm/s MV E velocity: 91.70 cm/s MV A velocity: 126.00 cm/s  SHUNTS MV E/A ratio:  0.73         Systemic VTI:  0.25 m                             Systemic Diam: 2.00 cm Kathlyn Sacramento MD Electronically signed by Kathlyn Sacramento MD Signature Date/Time: 01/08/2021/10:40:18 AM    Final     Scheduled Meds: . feeding supplement  237 mL Oral TID BM  . furosemide  40 mg Intravenous Q12H  . gabapentin  300 mg Oral TID  . methylPREDNISolone (SOLU-MEDROL) injection  60 mg Intravenous Q12H  . multivitamin with minerals  1 tablet Oral Daily  . pravastatin  10 mg Oral Daily   Continuous Infusions: . azithromycin 500 mg (01/08/21 1551)  . heparin 1,550 Units/hr (01/08/21 1152)     LOS: 1 day    Time spent:  40 minutes  This record has been created using Systems analyst. Errors have been sought and corrected,but may not always be located. Such creation errors do not reflect on the standard  of care.  Lorella Nimrod, MD [facility]   To contact the attending provider between 7A-7P or the covering provider during after hours 7P-7A, please log into the web site [email or web address] and access using universal [place] password for that web site. If you do not have the password, please call the hospital operator.  01/08/2021, 4:54 PM

## 2021-01-09 DIAGNOSIS — J9601 Acute respiratory failure with hypoxia: Secondary | ICD-10-CM | POA: Diagnosis not present

## 2021-01-09 DIAGNOSIS — R918 Other nonspecific abnormal finding of lung field: Secondary | ICD-10-CM | POA: Diagnosis not present

## 2021-01-09 DIAGNOSIS — I2694 Multiple subsegmental pulmonary emboli without acute cor pulmonale: Secondary | ICD-10-CM

## 2021-01-09 DIAGNOSIS — J80 Acute respiratory distress syndrome: Secondary | ICD-10-CM | POA: Diagnosis not present

## 2021-01-09 DIAGNOSIS — J189 Pneumonia, unspecified organism: Secondary | ICD-10-CM

## 2021-01-09 DIAGNOSIS — I10 Essential (primary) hypertension: Secondary | ICD-10-CM | POA: Diagnosis not present

## 2021-01-09 DIAGNOSIS — U071 COVID-19: Secondary | ICD-10-CM | POA: Diagnosis not present

## 2021-01-09 LAB — HEMOGLOBIN A1C
Hgb A1c MFr Bld: 6.9 % — ABNORMAL HIGH (ref 4.8–5.6)
Mean Plasma Glucose: 151.33 mg/dL

## 2021-01-09 LAB — CBC
HCT: 47.1 % — ABNORMAL HIGH (ref 36.0–46.0)
Hemoglobin: 14.9 g/dL (ref 12.0–15.0)
MCH: 29.7 pg (ref 26.0–34.0)
MCHC: 31.6 g/dL (ref 30.0–36.0)
MCV: 94 fL (ref 80.0–100.0)
Platelets: 168 10*3/uL (ref 150–400)
RBC: 5.01 MIL/uL (ref 3.87–5.11)
RDW: 17.8 % — ABNORMAL HIGH (ref 11.5–15.5)
WBC: 13.3 10*3/uL — ABNORMAL HIGH (ref 4.0–10.5)
nRBC: 0.8 % — ABNORMAL HIGH (ref 0.0–0.2)

## 2021-01-09 LAB — BASIC METABOLIC PANEL
Anion gap: 10 (ref 5–15)
BUN: 30 mg/dL — ABNORMAL HIGH (ref 8–23)
CO2: 30 mmol/L (ref 22–32)
Calcium: 7.6 mg/dL — ABNORMAL LOW (ref 8.9–10.3)
Chloride: 97 mmol/L — ABNORMAL LOW (ref 98–111)
Creatinine, Ser: 0.66 mg/dL (ref 0.44–1.00)
GFR, Estimated: >60 mL/min (ref >60)
Glucose, Bld: 171 mg/dL — ABNORMAL HIGH (ref 70–99)
Potassium: 3 mmol/L — ABNORMAL LOW (ref 3.5–5.1)
Sodium: 137 mmol/L (ref 135–145)

## 2021-01-09 LAB — MAGNESIUM: Magnesium: 2 mg/dL (ref 1.7–2.4)

## 2021-01-09 LAB — MISC LABCORP TEST (SEND OUT): Labcorp test code: 164068

## 2021-01-09 LAB — SEDIMENTATION RATE: Sed Rate: 32 mm/hr — ABNORMAL HIGH (ref 0–30)

## 2021-01-09 LAB — PHOSPHORUS: Phosphorus: 3.9 mg/dL (ref 2.5–4.6)

## 2021-01-09 LAB — MRSA PCR SCREENING: MRSA by PCR: NEGATIVE

## 2021-01-09 LAB — HEPARIN LEVEL (UNFRACTIONATED): Heparin Unfractionated: 0.71 IU/mL — ABNORMAL HIGH (ref 0.30–0.70)

## 2021-01-09 LAB — C-REACTIVE PROTEIN: CRP: 0.5 mg/dL (ref <1.0)

## 2021-01-09 LAB — PROCALCITONIN: Procalcitonin: <0.1 ng/mL

## 2021-01-09 MED ORDER — APIXABAN 5 MG PO TABS
10.0000 mg | ORAL_TABLET | Freq: Two times a day (BID) | ORAL | Status: AC
  Administered 2021-01-09 – 2021-01-15 (×14): 10 mg via ORAL
  Filled 2021-01-09 (×15): qty 2

## 2021-01-09 MED ORDER — METHYLPREDNISOLONE SODIUM SUCC 125 MG IJ SOLR: 60.0000 mg | INTRAMUSCULAR | Status: DC

## 2021-01-09 MED ORDER — POTASSIUM CHLORIDE CRYS ER 20 MEQ PO TBCR
40.0000 meq | EXTENDED_RELEASE_TABLET | Freq: Once | ORAL | Status: AC
  Administered 2021-01-09: 40 meq via ORAL
  Filled 2021-01-09: qty 2

## 2021-01-09 MED ORDER — APIXABAN 5 MG PO TABS
5.0000 mg | ORAL_TABLET | Freq: Two times a day (BID) | ORAL | Status: DC
  Administered 2021-01-16 – 2021-01-23 (×15): 5 mg via ORAL
  Filled 2021-01-09 (×15): qty 1

## 2021-01-09 MED ORDER — SODIUM CHLORIDE 0.9 % IV SOLN
500.0000 mg | Freq: Every day | INTRAVENOUS | Status: AC
  Administered 2021-01-09 – 2021-01-11 (×3): 500 mg via INTRAVENOUS
  Filled 2021-01-09 (×4): qty 4

## 2021-01-09 MED ORDER — SODIUM CHLORIDE 0.9 % IV SOLN
2.0000 g | Freq: Three times a day (TID) | INTRAVENOUS | Status: DC
  Administered 2021-01-09 – 2021-01-13 (×13): 2 g via INTRAVENOUS
  Filled 2021-01-09 (×14): qty 2

## 2021-01-09 NOTE — Consult Note (Addendum)
Name: Elizabeth Rowland MRN: [id number] DOB: 10-Jul-1941     CONSULTATION DATE: 01/06/2021  REFERRING MD :  Nelson ChimesAmin  CHIEF COMPLAINT:  Shortness of breath    ASSESSMENT AND PLAN   RESPIRATORY:  CT with GGO and interstitial thickening, small PE noted on mediastinal cuts  Thus far has remained COVID negative BAL sample is not necessarily high yield  Bronch sampling for tissue is also not high yield as an OLB is best for architecture Either way she would require intubation for bronchoscopy and risk is not worth the effort Whatever would be treatable would likely end up on the spectrum of steroid To that end I have 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  Although less likely will send off connective tissue screen, again steroid path of therapy nonetheless  Maintain current volume status, and allow to re-equilibrate (she is over 3L net negative)   PE not hemodynamic, not causing RV strain, given interstitial disease is not the cause of hypoxia  CV:     ECHO  1. Left ventricular ejection fraction, by estimation, is 55 to 60%. The  left ventricle has normal function. Left ventricular endocardial border  not optimally defined to evaluate regional wall motion. Left ventricular  diastolic parameters are consistent  with Grade I diastolic dysfunction (impaired relaxation).  2. RV systolic function and size are normal. PA pressure.  3. Left atrial size was mildly dilated.  4. Right atrial size was mildly dilated.  5. The mitral valve is normal in structure. No evidence of mitral valve  regurgitation. No evidence of mitral stenosis.  6. The aortic valve is normal in structure. Aortic valve regurgitation is  not visualized. No  aortic stenosis is present.   EF preserved, no evidence for overt failure although BNP was 2 fold elevated  Trend and see now that the patient had been diuresed somewhat  ID As above  SUBJECTIVE   HPI: 80 yo WF presented to the ED via EMS for shortness of breath and field saturation in the 40s. She can provide no history at the moment, but her son is at the bedside and the original record is used for this description. The patient has been at home except to see her pain clinic. Her son reports only a few persons visit. He cannot recall any recent (within the month or so) of cough or other prodromal symptoms. The patient was never vaccinated and does not suffer from diabetes, but she is obese. Her COVID screen is negative x2 and a full respiratory Biofire was also negative for a range of viral infections. Despite this a CT was noted to have significant GGO and interstitial infiltrates. Her BNP was two fold elevated, but the pattern does not seem of overt volume overload and an echo revealed preserved LV function. Her procalcitonin is low for pneumonia, however she did have a leukocytosis.   PAST MEDICAL HISTORY :  HTN Fibromyalgia HLD Chronic Pain  Prior to Admission medications   Medication Sig Start Date End Date Taking? Authorizing Provider  Acetaminophen 500 MG capsule Take 500 mg by mouth every 4 (four) hours as needed.   Yes [provider]  gabapentin (NEURONTIN) 300 MG capsule Take 1 capsule by mouth 3 (three) times daily. 11/24/20  Yes [provider]  pravastatin (PRAVACHOL) 10 MG tablet Take 10 mg by mouth daily. 03/26/14  Yes [provider]  traMADol (ULTRAM) 50 MG tablet Take 50 mg by mouth 3 (three) times daily as needed. 11/24/20  Yes [provider]   FAMILY HISTORY:  DM, HTN  SOCIAL HISTORY:  reports that she has never smoked. She has never used smokeless tobacco. She reports that she does not drink alcohol and does not use  drugs.  REVIEW OF SYSTEMS:   Unable to obtain due to shortness of breath and mental status  Estimated body mass index is 35.41 kg/m as calculated from the following:   Height as of this encounter: 5\' 6"  (1.676 m).   Weight as of this encounter: 99.5 kg.   OBJECTIVE    VITAL SIGNS: Temp:  [97.5 F (36.4 C)-98.6 F (37 C)] 97.5 F (36.4 C) (04/26 0717) Pulse Rate:  [77-95] 77 (04/26 0717) Resp:  [18-20] 18 (04/26 0717) BP: (130-137)/(55-68) 137/68 (04/26 0717) SpO2:  [90 %-94 %] 90 % (04/26 0717) FiO2 (%):  [70 %] 70 % (04/26 0447)   I/O last 3 completed shifts: In: 490 [P.O.:490] Out: 3050 [Urine:3050] No intake/output data recorded.   SpO2: 90 % O2 Flow Rate (L/min): 40 L/min FiO2 (%): 70 %   Physical Examination:  GENERAL:critically ill appearing, not in overt resp distress HEAD: Normocephalic, atraumatic.  EYES: Pupils equal, round, reactive to light.  No scleral icterus.  MOUTH: Moist mucosal membrane. NECK: Supple. No JVD.  PULMONARY: few rhonchi scattered, diminished breath sounds all fields CARDIOVASCULAR:RRR, no murmur  GASTROINTESTINAL: Soft, nontender, -distended.  Positive bowel sounds.  MUSCULOSKELETAL: trace edema.  NEUROLOGIC: opens eyes, tracks, non-communicating.  SKIN:intact,warm,dry  I personally reviewed lab work that was obtained in last 24 hrs. CXR and CT Independently reviewed, CT image slice appended to this note  MEDICATIONS: I have reviewed all medications and confirmed regimen as documented   CULTURES: Recent Results (from the past 240 hour(s))  Resp Panel by RT-PCR (Flu A&B, Covid) Nasopharyngeal Swab     Status: None   Collection Time: 01/06/21  1:05 PM   Specimen: Nasopharyngeal Swab; Nasopharyngeal(NP) swabs in vial transport medium  Result Value Ref Range Status   SARS Coronavirus 2 by RT PCR NEGATIVE NEGATIVE Final    Comment: (NOTE) SARS-CoV-2 target nucleic acids are NOT DETECTED.  The SARS-CoV-2 RNA is generally  detectable in upper respiratory specimens during the acute phase of infection. The lowest concentration of SARS-CoV-2 viral copies this assay can detect is 138 copies/mL. A negative result does not preclude SARS-Cov-2 infection and should not be used as the sole basis for treatment or other patient management decisions. A negative result may occur with  improper specimen collection/handling, submission of specimen other than nasopharyngeal swab, presence of viral mutation(s) within the areas targeted by this assay, and inadequate number of viral copies(<138 copies/mL). A negative result must be combined with clinical observations, patient history, and epidemiological information. The expected result is Negative.  Fact Sheet for Patients:  01/08/21  Fact Sheet for Healthcare Providers:  BloggerCourse.com  This test is no t yet approved or cleared by the [employer] FDA and  has been authorized for detection and/or diagnosis of SARS-CoV-2 by FDA under an Emergency Use Authorization (EUA). This EUA will remain  in effect (meaning this test can be used) for the duration of the COVID-19 declaration under Section 564(b)(1) of the Act, 21 U.S.C.section 360bbb-3(b)(1), unless the authorization is terminated  or  revoked sooner.       Influenza A by PCR NEGATIVE NEGATIVE Final   Influenza B by PCR NEGATIVE NEGATIVE Final    Comment: (NOTE) The Xpert Xpress SARS-CoV-2/FLU/RSV plus assay is intended as an aid in the diagnosis of influenza from Nasopharyngeal swab specimens and should not be used as a sole basis for treatment. Nasal washings and aspirates are unacceptable for Xpert Xpress SARS-CoV-2/FLU/RSV testing.  Fact Sheet for Patients: BloggerCourse.com  Fact Sheet for Healthcare Providers: [employer]  This test is not yet approved or cleared by the [place] FDA  and has been authorized for detection and/or diagnosis of SARS-CoV-2 by FDA under an Emergency Use Authorization (EUA). This EUA will remain in effect (meaning this test can be used) for the duration of the COVID-19 declaration under Section 564(b)(1) of the Act, 21 U.S.C. section 360bbb-3(b)(1), unless the authorization is terminated or revoked.  Performed at [facility], [address] [place], [place] [address]   Culture, blood (routine x 2)     Status: None (Preliminary result)   Collection Time: 01/06/21  3:00 PM   Specimen: BLOOD  Result Value Ref Range Status   Specimen Description BLOOD  LEFT AC  Final   Special Requests   Final    BOTTLES DRAWN AEROBIC AND ANAEROBIC Blood Culture results may not be optimal due to an inadequate volume of blood received in culture bottles   Culture   Final    NO GROWTH 3 DAYS Performed at [facility], [address] [place], [place] [address]    Report Status PENDING  Incomplete  Culture, blood (routine x 2)     Status: None (Preliminary result)   Collection Time: 01/06/21  3:00 PM   Specimen: BLOOD  Result Value Ref Range Status   Specimen Description BLOOD  RIGHT [facility]  Final   Special Requests   Final    BOTTLES DRAWN AEROBIC AND ANAEROBIC Blood Culture results may not be optimal due to an inadequate volume of blood received in culture bottles   Culture   Final    NO GROWTH 3 DAYS Performed at [facility], [address] [place], [place] [address]    Report Status PENDING  Incomplete  Respiratory (~20 pathogens) panel by PCR     Status: None   Collection Time: 01/07/21  8:03 AM   Specimen: Nasopharyngeal Swab; Respiratory  Result Value Ref Range Status   Adenovirus NOT DETECTED NOT DETECTED Final   Coronavirus 229E NOT DETECTED NOT DETECTED Final    Comment: (NOTE) The Coronavirus on the Respiratory Panel, DOES NOT test for the novel  Coronavirus (2019 nCoV)    Coronavirus HKU1 NOT DETECTED NOT  DETECTED Final   Coronavirus NL63 NOT DETECTED NOT DETECTED Final   Coronavirus OC43 NOT DETECTED NOT DETECTED Final   Metapneumovirus NOT DETECTED NOT DETECTED Final   Rhinovirus / Enterovirus NOT DETECTED NOT DETECTED Final   Influenza A NOT DETECTED NOT DETECTED Final   Influenza B NOT DETECTED NOT DETECTED Final   Parainfluenza Virus 1 NOT DETECTED NOT DETECTED Final   Parainfluenza Virus 2 NOT DETECTED NOT DETECTED Final   Parainfluenza Virus 3 NOT DETECTED NOT DETECTED Final   Parainfluenza Virus 4 NOT DETECTED NOT DETECTED Final   Respiratory Syncytial Virus NOT DETECTED NOT DETECTED Final   Bordetella pertussis NOT DETECTED NOT DETECTED Final   Bordetella Parapertussis NOT DETECTED NOT DETECTED Final   Chlamydophila pneumoniae NOT DETECTED NOT DETECTED Final   Mycoplasma pneumoniae  NOT DETECTED NOT DETECTED Final    Comment: Performed at [facility], [address], [place], [place] [address]       CASE DISCUSSED IN MULTIDISCIPLINARY ROUNDS WITH ICU TEAM  Patient seen and examined face to face fashion Images of the day and lab values reviewed Time spent managing patient: 120 min, seeing the patient:      -communicating with family, staff, and providers,       -addressing the documentation and orders      -reviewing the record.

## 2021-01-09 NOTE — Progress Notes (Signed)
PROGRESS NOTE    Elizabeth Rowland  ZES:[id number] DOB: 1941/05/11 DOA: 01/06/2021 PCP: Marguerita Merles, MD   Chief complaint.  Shortness of breath. Brief Narrative:  Elizabeth Rowland is a 80 y.o. female with medical history significant for fibromyalgia, history of hypertension, history of hyperlipidemia, currently on tramadol and gabapentin for chronic pain and fibromyalgia, no longer taking antihypertensives for pravastatin, presents to the emergency department for chief concerns of shortness of breath.  Symptoms started 3 weeks ago. In the emergency room, patient was found to have acute respiratory failure, placed on 10 L oxygen.  She also met severe sepsis criteria.  Currently on 40 L with heated high flow. CT angiogram showed multiple PE with extensive groundglass opacities as well as interstitial changes. Patient was not vaccinated for COVID-19. Is placed on antibiotics with Zithromax and Rocephin. COVID PCR is negative, IgG is negative-capsid IgM was  Ordered. Small PE burden-there is no need for thrombectomy or embolectomy-concern of underlying interstitial lung disease/post COVID pneumonitis, although COVID test remain negative. Pulmonology was consulted and they started her on pulse dose of steroid today for 3 days to see if that will help with her oxygen requirement  Assessment & Plan:   Active Problems:   Severe sepsis with acute organ dysfunction (HCC)   Hyperlipidemia   Fibromyalgia   Essential hypertension   Acute hypoxemic respiratory failure (HCC)   Acute respiratory distress syndrome (ARDS) due to COVID-19 virus ([place])   Pulmonary emboli (HCC)   Acute hypoxemic respiratory failure. ARDS. Presumed COVID-19 infection. Acute pulmonary emboli. Severe sepsis criteria Patient met severe sepsis criteria, could be due to COVID.  No evidence of bacterial infection, procalcitonin level less than 0.1. Patient had no vaccination for COVID, present with 3 weeks symptoms of  shortness of breath.  I personally reviewed patient CT scan, consistent with ARDS due to COVID-19 infection. Current COVID PCR negative, COVID IgG antibody negative. Patient has a severe acute respiratory failure with extensive groundglass changes in the lungs, prognosis is guarded-palliative care was consulted. Patient has mild elevation of BNP, echocardiogram with normal EF and grade 1 diastolic dysfunction.  Remained on heated high flow at 40 L of oxygen, 70% FiO2 -Pulmonary consult-started her on pulse dose steroid of 500 mg IV daily for 3 days. -Rheumatologic labs are pending -Continue with steroid -Continue with Zithromax-to complete a 5-day course -Continue with IV Lasix  Bilateral PE/right popliteal vein DVT.  Vascular surgery was consulted due to worsening hypoxia.  Per vascular surgery patient has a very small burden of PE and there are not recommending any procedure at this time. -Discontinue heparin and start her on Eliquis   Elevated troponin.  Secondary to demand ischemia from acute respiratory failure. Follow.    Lung mass. Identified on the initial chest x-ray, CT scan did not show up due to extensive chronic changes.  Need to follow-up as outpatient.  Thyroid nodule. -Outpatient follow-up.  Thrombocytopenia.  Resolved. Likely secondary to PE or recent viral infection.  No sign of bleeding.  -Continue to monitor   DVT prophylaxis: Eliquis Code Status: Full Family Communication: Son was updated at bedside. Disposition Plan:  .   Status is: Inpatient  Dispo: The patient is from: Home              Anticipated d/c is to: Home              Patient currently is not medically stable to d/c.   Difficult to place patient No  Patient is high risk for deterioration, intubation and death.  Palliative care was consulted today. Currently full code.  I/O last 3 completed shifts: In: 7 [P.O.:490] Out: 3050 [Urine:3050] Total I/O In: 240 [P.O.:240] Out: 800  [Urine:800]    Consultants:   Vascular surgery  Pulmonology  Procedures: none  Antimicrobials: zithromax  Subjective: Patient was just resting comfortably when seen today.  No new complaint.  Son at bedside.  According to son her symptoms started few weeks ago with some upset stomach, mild upper respiratory symptoms and loss of taste which is more consistent with COVID.  Though symptoms followed by worsening fatigue and shortness of breath.  Objective: Vitals:   01/08/21 2152 01/09/21 0447 01/09/21 0717 01/09/21 1151  BP:   137/68 (!) 127/47  Pulse:   77 86  Resp:   18 20  Temp:   (!) 97.5 F (36.4 C) 97.6 F (36.4 C)  TempSrc:   Oral   SpO2: 91% 90% 90% 94%  Weight:      Height:        Intake/Output Summary (Last 24 hours) at 01/09/2021 1446 Last data filed at 01/09/2021 1430 Gross per 24 hour  Intake 340 ml  Output 3150 ml  Net -2810 ml   Filed Weights   01/06/21 1301 01/06/21 1707  Weight: 100 kg 99.5 kg    Examination:  General.  Chronically ill-appearing elderly lady, in no acute distress. Pulmonary.  Lungs clear bilaterally, normal respiratory effort. CV.  Regular rate and rhythm, no JVD, rub or murmur. Abdomen.  Soft, nontender, nondistended, BS positive. CNS.  Alert and oriented .  No focal neurologic deficit. Extremities.  No edema, no cyanosis, pulses intact and symmetrical. Psychiatry.  Judgment and insight appears impaired.  Data Reviewed: I have personally reviewed following labs and imaging studies  CBC: Recent Labs  Lab 01/06/21 1305 01/07/21 0456 01/08/21 0706 01/09/21 0427  WBC 17.3* 19.1* 12.9* 13.3*  NEUTROABS 9.9*  --  10.3*  --   HGB 15.6* 14.6 15.4* 14.9  HCT 49.8* 47.3* 49.2* 47.1*  MCV 95.0 95.7 95.5 94.0  PLT 123* 120* 118* 500   Basic Metabolic Panel: Recent Labs  Lab 01/06/21 1305 01/07/21 0456 01/08/21 0706 01/09/21 0427  NA 139 142 139 137  K 4.0 3.6 3.4* 3.0*  CL 103 107 99 97*  CO2 _0 GLUCOSE 139*  119* 166* 171*  BUN _1 30*  CREATININE 0.89 0.78 0.80 0.66  CALCIUM 7.9* 7.6* 7.6* 7.6*  MG  --   --  2.0 2.0  PHOS  --   --   --  3.9   GFR: Estimated Creatinine Clearance: 65.6 mL/min (by C-G formula based on SCr of 0.66 mg/dL). Liver Function Tests: Recent Labs  Lab 01/07/21 0456  AST 34  ALT 17  ALKPHOS 52  BILITOT 2.7*  PROT 6.1*  ALBUMIN 2.4*   No results for input(s): LIPASE, AMYLASE in the last 168 hours. No results for input(s): AMMONIA in the last 168 hours. Coagulation Profile: Recent Labs  Lab 01/06/21 1644 01/07/21 0456  INR 1.6* 1.6*   Cardiac Enzymes: No results for input(s): CKTOTAL, CKMB, CKMBINDEX, TROPONINI in the last 168 hours. BNP (last 3 results) No results for input(s): PROBNP in the last 8760 hours. HbA1C: No results for input(s): HGBA1C in the last 72 hours. CBG: No results for input(s): GLUCAP in the last 168 hours. Lipid Profile: No results for input(s): CHOL, HDL, LDLCALC, TRIG, CHOLHDL, LDLDIRECT in  the last 72 hours. Thyroid Function Tests: No results for input(s): TSH, T4TOTAL, FREET4, T3FREE, THYROIDAB in the last 72 hours. Anemia Panel: No results for input(s): VITAMINB12, FOLATE, FERRITIN, TIBC, IRON, RETICCTPCT in the last 72 hours. Sepsis Labs: Recent Labs  Lab 01/06/21 1305 01/06/21 1741 01/06/21 2020 01/07/21 0456 01/09/21 0427  PROCALCITON  --   --   --  0.10 <0.10  LATICACIDVEN 1.9 2.2* 1.8  --   --     Recent Results (from the past 240 hour(s))  Resp Panel by RT-PCR (Flu A&B, Covid) Nasopharyngeal Swab     Status: None   Collection Time: 01/06/21  1:05 PM   Specimen: Nasopharyngeal Swab; Nasopharyngeal(NP) swabs in vial transport medium  Result Value Ref Range Status   SARS Coronavirus 2 by RT PCR NEGATIVE NEGATIVE Final    Comment: (NOTE) SARS-CoV-2 target nucleic acids are NOT DETECTED.  The SARS-CoV-2 RNA is generally detectable in upper respiratory specimens during the acute phase of infection. The  lowest concentration of SARS-CoV-2 viral copies this assay can detect is 138 copies/mL. A negative result does not preclude SARS-Cov-2 infection and should not be used as the sole basis for treatment or other patient management decisions. A negative result may occur with  improper specimen collection/handling, submission of specimen other than nasopharyngeal swab, presence of viral mutation(s) within the areas targeted by this assay, and inadequate number of viral copies(<138 copies/mL). A negative result must be combined with clinical observations, patient history, and epidemiological information. The expected result is Negative.  Fact Sheet for Patients:  EntrepreneurPulse.com.au  Fact Sheet for Healthcare Providers:  IncredibleEmployment.be  This test is no t yet approved or cleared by the [place] FDA and  has been authorized for detection and/or diagnosis of SARS-CoV-2 by FDA under an Emergency Use Authorization (EUA). This EUA will remain  in effect (meaning this test can be used) for the duration of the COVID-19 declaration under Section 564(b)(1) of the Act, 21 U.S.C.section 360bbb-3(b)(1), unless the authorization is terminated  or revoked sooner.       Influenza A by PCR NEGATIVE NEGATIVE Final   Influenza B by PCR NEGATIVE NEGATIVE Final    Comment: (NOTE) The Xpert Xpress SARS-CoV-2/FLU/RSV plus assay is intended as an aid in the diagnosis of influenza from Nasopharyngeal swab specimens and should not be used as a sole basis for treatment. Nasal washings and aspirates are unacceptable for Xpert Xpress SARS-CoV-2/FLU/RSV testing.  Fact Sheet for Patients: EntrepreneurPulse.com.au  Fact Sheet for Healthcare Providers: IncredibleEmployment.be  This test is not yet approved or cleared by the [place] FDA and has been authorized for detection and/or diagnosis of SARS-CoV-2 by FDA under  an Emergency Use Authorization (EUA). This EUA will remain in effect (meaning this test can be used) for the duration of the COVID-19 declaration under Section 564(b)(1) of the Act, 21 U.S.C. section 360bbb-3(b)(1), unless the authorization is terminated or revoked.  Performed at [facility], [address] [place], [place] [address]   Culture, blood (routine x 2)     Status: None (Preliminary result)   Collection Time: 01/06/21  3:00 PM   Specimen: BLOOD  Result Value Ref Range Status   Specimen Description BLOOD  LEFT AC  Final   Special Requests   Final    BOTTLES DRAWN AEROBIC AND ANAEROBIC Blood Culture results may not be optimal due to an inadequate volume of blood received in culture bottles   Culture   Final    NO GROWTH  3 DAYS Performed at [facility], [address] [place], [place] [address]    Report Status PENDING  Incomplete  Culture, blood (routine x 2)     Status: None (Preliminary result)   Collection Time: 01/06/21  3:00 PM   Specimen: BLOOD  Result Value Ref Range Status   Specimen Description BLOOD  RIGHT [facility]  Final   Special Requests   Final    BOTTLES DRAWN AEROBIC AND ANAEROBIC Blood Culture results may not be optimal due to an inadequate volume of blood received in culture bottles   Culture   Final    NO GROWTH 3 DAYS Performed at [facility], [address], [place], [place] [address]    Report Status PENDING  Incomplete  Respiratory (~20 pathogens) panel by PCR     Status: None   Collection Time: 01/07/21  8:03 AM   Specimen: Nasopharyngeal Swab; Respiratory  Result Value Ref Range Status   Adenovirus NOT DETECTED NOT DETECTED Final   Coronavirus 229E NOT DETECTED NOT DETECTED Final    Comment: (NOTE) The Coronavirus on the Respiratory Panel, DOES NOT test for the novel  Coronavirus (2019 nCoV)    Coronavirus HKU1 NOT DETECTED NOT DETECTED Final   Coronavirus NL63 NOT DETECTED NOT DETECTED Final   Coronavirus  OC43 NOT DETECTED NOT DETECTED Final   Metapneumovirus NOT DETECTED NOT DETECTED Final   Rhinovirus / Enterovirus NOT DETECTED NOT DETECTED Final   Influenza A NOT DETECTED NOT DETECTED Final   Influenza B NOT DETECTED NOT DETECTED Final   Parainfluenza Virus 1 NOT DETECTED NOT DETECTED Final   Parainfluenza Virus 2 NOT DETECTED NOT DETECTED Final   Parainfluenza Virus 3 NOT DETECTED NOT DETECTED Final   Parainfluenza Virus 4 NOT DETECTED NOT DETECTED Final   Respiratory Syncytial Virus NOT DETECTED NOT DETECTED Final   Bordetella pertussis NOT DETECTED NOT DETECTED Final   Bordetella Parapertussis NOT DETECTED NOT DETECTED Final   Chlamydophila pneumoniae NOT DETECTED NOT DETECTED Final   Mycoplasma pneumoniae NOT DETECTED NOT DETECTED Final    Comment: Performed at [facility], [place] [address], [place], [place] [address]  MRSA PCR Screening     Status: None   Collection Time: 01/09/21 12:41 PM   Specimen: Nasopharyngeal  Result Value Ref Range Status   MRSA by PCR NEGATIVE NEGATIVE Final    Comment:        The GeneXpert MRSA Assay (FDA approved for NASAL specimens only), is one component of a comprehensive MRSA colonization surveillance program. It is not intended to diagnose MRSA infection nor to guide or monitor treatment for MRSA infections. Performed at [facility], [address] [place], [place] [address]          Radiology Studies: DG CHEST PORT 1 VIEW  Result Date: 01/08/2021 CLINICAL DATA:  Increased shortness of breath. EXAM: PORTABLE CHEST 1 VIEW COMPARISON:  01/06/2021 chest radiograph and CT chest. FINDINGS: Patient is rotated. Heart is enlarged, stable. Similar to minimally increased diffuse mixed interstitial and airspace opacification. There may be a small right pleural effusion. IMPRESSION: 1. Similar to minimally increased diffuse mixed interstitial and airspace opacification. Findings may be due to edema or viral/atypical pneumonia,  including due to COVID-19. 2. Probable small right pleural effusion. Electronically Signed   By: Lorin Picket M.D.   On: 01/08/2021 08:54   ECHOCARDIOGRAM COMPLETE  Result Date: 01/08/2021    ECHOCARDIOGRAM REPORT   Patient Name:   Veverly A Bradish Date of  Exam: 01/08/2021 Medical Rec #:  [id number]           Height:       66.0 in Accession #:    [id number]          Weight:       219.4 lb Date of Birth:  [birth year]          BSA:          2.080 m Patient Age:    40 years            BP:           158/58 mmHg Patient Gender: F                   HR:           92 bpm. Exam Location:  [facility] Procedure: 2D Echo, Cardiac Doppler and Color Doppler Indications:     Pulmonary embolus I26.09  History:         Patient has no prior history of Echocardiogram examinations. No                  medical history on file.  Sonographer:     Sherrie Sport RDCS (AE) Referring Phys:  [id number] AMY N COX Diagnosing Phys: Kathlyn Sacramento MD  Sonographer Comments: Technically challenging study due to limited acoustic windows and suboptimal apical window. IMPRESSIONS  1. Left ventricular ejection fraction, by estimation, is 55 to 60%. The left ventricle has normal function. Left ventricular endocardial border not optimally defined to evaluate regional wall motion. Left ventricular diastolic parameters are consistent with Grade I diastolic dysfunction (impaired relaxation).  2. Right ventricular systolic function is normal. The right ventricular size is normal. Tricuspid regurgitation signal is inadequate for assessing PA pressure.  3. Left atrial size was mildly dilated.  4. Right atrial size was mildly dilated.  5. The mitral valve is normal in structure. No evidence of mitral valve regurgitation. No evidence of mitral stenosis.  6. The aortic valve is normal in structure. Aortic valve regurgitation is not visualized. No aortic stenosis is present. FINDINGS  Left Ventricle: Left ventricular ejection fraction, by estimation, is 55 to 60%. The  left ventricle has normal function. Left ventricular endocardial border not optimally defined to evaluate regional wall motion. The left ventricular internal cavity size was normal in size. There is no left ventricular hypertrophy. Left ventricular diastolic parameters are consistent with Grade I diastolic dysfunction (impaired relaxation). Right Ventricle: The right ventricular size is normal. No increase in right ventricular wall thickness. Right ventricular systolic function is normal. Tricuspid regurgitation signal is inadequate for assessing PA pressure. Left Atrium: Left atrial size was mildly dilated. Right Atrium: Right atrial size was mildly dilated. Pericardium: There is no evidence of pericardial effusion. Mitral Valve: The mitral valve is normal in structure. No evidence of mitral valve regurgitation. No evidence of mitral valve stenosis. Tricuspid Valve: The tricuspid valve is normal in structure. Tricuspid valve regurgitation is not demonstrated. No evidence of tricuspid stenosis. Aortic Valve: The aortic valve is normal in structure. Aortic valve regurgitation is not visualized. No aortic stenosis is present. Aortic valve mean gradient measures 3.0 mmHg. Aortic valve peak gradient measures 5.9 mmHg. Aortic valve area, by VTI measures 3.45 cm. Pulmonic Valve: The pulmonic valve was normal in structure. Pulmonic valve regurgitation is not visualized. No evidence of pulmonic stenosis. Aorta: The aortic root is normal in size and structure. Venous: The inferior vena cava was not well visualized. IAS/Shunts:  No atrial level shunt detected by color flow Doppler.  LEFT VENTRICLE PLAX 2D LVIDd:         3.36 cm  Diastology LVIDs:         2.49 cm  LV e' medial:    4.46 cm/s LV PW:         1.08 cm  LV E/e' medial:  20.6 LV IVS:        0.95 cm  LV e' lateral:   6.74 cm/s LVOT diam:     2.00 cm  LV E/e' lateral: 13.6 LV SV:         78 LV SV Index:   37 LVOT Area:     3.14 cm  RIGHT VENTRICLE RV Basal diam:  3.98  cm RV S prime:     12.40 cm/s LEFT ATRIUM             Index       RIGHT ATRIUM           Index LA diam:        4.00 cm 1.92 cm/m  RA Area:     23.30 cm LA Vol (A2C):   54.5 ml 26.20 ml/m RA Volume:   78.60 ml  37.79 ml/m LA Vol (A4C):   56.6 ml 27.21 ml/m LA Biplane Vol: 55.3 ml 26.59 ml/m  AORTIC VALVE                   PULMONIC VALVE AV Area (Vmax):    2.78 cm    PV Vmax:        0.60 m/s AV Area (Vmean):   2.71 cm    PV Peak grad:   1.4 mmHg AV Area (VTI):     3.45 cm    RVOT Peak grad: 4 mmHg AV Vmax:           121.00 cm/s AV Vmean:          86.600 cm/s AV VTI:            0.226 m AV Peak Grad:      5.9 mmHg AV Mean Grad:      3.0 mmHg LVOT Vmax:         107.00 cm/s LVOT Vmean:        74.600 cm/s LVOT VTI:          0.248 m LVOT/AV VTI ratio: 1.10  AORTA Ao Root diam: 2.30 cm MITRAL VALVE                TRICUSPID VALVE MV Area (PHT): 2.74 cm     TR Peak grad:   13.7 mmHg MV Decel Time: 277 msec     TR Vmax:        185.00 cm/s MV E velocity: 91.70 cm/s MV A velocity: 126.00 cm/s  SHUNTS MV E/A ratio:  0.73         Systemic VTI:  0.25 m                             Systemic Diam: 2.00 cm Kathlyn Sacramento MD Electronically signed by Kathlyn Sacramento MD Signature Date/Time: 01/08/2021/10:40:18 AM    Final     Scheduled Meds: . apixaban  10 mg Oral BID   Followed by  . [START ON 01/16/2021] apixaban  5 mg Oral BID  . feeding supplement  237 mL Oral TID BM  . furosemide  40 mg Intravenous Q12H  .  gabapentin  300 mg Oral TID  . multivitamin with minerals  1 tablet Oral Daily  . pravastatin  10 mg Oral Daily   Continuous Infusions: . azithromycin 500 mg (01/08/21 1551)  . ceFEPime (MAXIPIME) IV 2 g (01/09/21 1247)  . methylPREDNISolone (SOLU-MEDROL) injection 500 mg (01/09/21 1415)     LOS: 2 days    Time spent: 35 minutes  This record has been created using Systems analyst. Errors have been sought and corrected,but may not always be located. Such creation errors do not reflect  on the standard of care.  Lorella Nimrod, MD [facility]   To contact the attending provider between 7A-7P or the covering provider during after hours 7P-7A, please log into the web site [email or web address] and access using universal [place] password for that web site. If you do not have the password, please call the hospital operator.  01/09/2021, 2:46 PM

## 2021-01-09 NOTE — Consult Note (Signed)
ANTICOAGULATION CONSULT NOTE  Pharmacy Consult for heparin infusion >> eliquis Indication: pulmonary embolus  Not on File  Patient Measurements: Height: 5\' 6"  (167.6 cm) Weight: 99.5 kg (219 lb 6.4 oz) IBW/kg (Calculated) : 59.3 Heparin Dosing Weight: 83.9 kg  Vital Signs: Temp: 97.5 F (36.4 C) (04/26 0717) Temp Source: Oral (04/26 0717) BP: 137/68 (04/26 0717) Pulse Rate: 77 (04/26 0717)  Labs: Recent Labs    01/06/21 1305 01/06/21 1644 01/07/21 0456 01/07/21 1140 01/07/21 2238 01/08/21 0706 01/09/21 0427  HGB 15.6*  --  14.6  --   --  15.4* 14.9  HCT 49.8*  --  47.3*  --   --  49.2* 47.1*  PLT 123*  --  120*  --   --  118* 168  LABPROT  --  18.6* 18.7*  --   --   --   --   INR  --  1.6* 1.6*  --   --   --   --   HEPARINUNFRC  --   --   --    < > 0.47 0.49 0.71*  CREATININE 0.89  --  0.78  --   --  0.80 0.66  TROPONINIHS 20*  19*  --   --   --   --   --   --    < > = values in this interval not displayed.    Estimated Creatinine Clearance: 65.6 mL/min (by C-G formula based on SCr of 0.66 mg/dL).   Medical History: History reviewed. No pertinent past medical history.  Medications:   enoxaparin 50 mg SQ x 1 given 04/23 1600 (DVT ppx)  Assessment: 80 y.o. female presents to the emergency department for chief concerns of shortness of breath. CT exam showing bilateral segmental and subsegmental PE. Pharmacy has been consulted for initiation and management of heparin infusion now for planning transition to Eliquis.  Labs: INR 1.6>1.6; plts 123>118>168; Hgb 14.6>15.4>14.9   Goal of Therapy:  Heparin level 0.3-0.7 units/ml Monitor platelets by anticoagulation protocol: Yes   Date Time HL Rate/Comment 4/24 1140 0.28 Subthera; 1400>1550 un/hr 4/24 2238 0.47 therapeutic x 1; 1550 un/hr 4/25 0706 0.49 Therapeutic x2; 1550 un/hr 4/26     0427    0.71     Supratherapeutic @ 1550 units/hr  Plan:  - Stop heparin drip and start 1st dose of eliquis. Was  therapeutic on heparin for ~24-48hrs no need to reduce 7d lead in. - Initiate Eliquis at 10mg  BID x7days; followed by 5mg  BID thereafter. CTM daily CBCs while on therapeutic AC.  5/26, [facility] 01/09/2021 8:36 AM

## 2021-01-09 NOTE — Consult Note (Signed)
ANTICOAGULATION CONSULT NOTE  Pharmacy Consult for heparin infusion Indication: pulmonary embolus  Not on File  Patient Measurements: Height: 5\' 6"  (167.6 cm) Weight: 99.5 kg (219 lb 6.4 oz) IBW/kg (Calculated) : 59.3 Heparin Dosing Weight: 83.9 kg  Vital Signs: Temp: 98.6 F (37 C) (04/25 1932) BP: 137/55 (04/25 1932) Pulse Rate: 84 (04/25 1932)  Labs: Recent Labs    01/06/21 1305 01/06/21 1644 01/07/21 0456 01/07/21 1140 01/07/21 2238 01/08/21 0706 01/09/21 0427  HGB 15.6*  --  14.6  --   --  15.4* 14.9  HCT 49.8*  --  47.3*  --   --  49.2* 47.1*  PLT 123*  --  120*  --   --  118* 168  LABPROT  --  18.6* 18.7*  --   --   --   --   INR  --  1.6* 1.6*  --   --   --   --   HEPARINUNFRC  --   --   --    < > 0.47 0.49 0.71*  CREATININE 0.89  --  0.78  --   --  0.80 0.66  TROPONINIHS 20*  19*  --   --   --   --   --   --    < > = values in this interval not displayed.    Estimated Creatinine Clearance: 65.6 mL/min (by C-G formula based on SCr of 0.66 mg/dL).   Medical History: History reviewed. No pertinent past medical history.  Medications:   enoxaparin 50 mg SQ x 1 given 04/23 1600 (DVT ppx)  Assessment: 80 y.o. female presents to the emergency department for chief concerns of shortness of breath. CT exam showing bilateral segmental and subsegmental PE. Pharmacy has been consulted for initiation and management of heparin infusion.  Labs: INR 1.6>1.6; plts 123>118; Hgb 14.6>15.4   Goal of Therapy:  Heparin level 0.3-0.7 units/ml Monitor platelets by anticoagulation protocol: Yes   Date Time HL Rate/Comment 4/24 1140 0.28 Subthera; 1400>1550 un/hr 4/24 2238 0.47 therapeutic x 1; 1550 un/hr 4/25 0706 0.49 Therapeutic x2; 1550 un/hr 4/26     0427    0.71     Subtherapeutic @ 1550 units/hr  Plan:  4/26:  HL @ 0427 = 0.71 Will decrease drip rate to 1450 units/hr and recheck HL 8 hrs after rate change.   Katlynne Mckercher D, [facility] 01/09/2021 5:17 AM

## 2021-01-10 DIAGNOSIS — J189 Pneumonia, unspecified organism: Secondary | ICD-10-CM | POA: Diagnosis not present

## 2021-01-10 DIAGNOSIS — J9601 Acute respiratory failure with hypoxia: Secondary | ICD-10-CM | POA: Diagnosis not present

## 2021-01-10 DIAGNOSIS — Z515 Encounter for palliative care: Secondary | ICD-10-CM | POA: Diagnosis not present

## 2021-01-10 DIAGNOSIS — I2694 Multiple subsegmental pulmonary emboli without acute cor pulmonale: Secondary | ICD-10-CM | POA: Diagnosis not present

## 2021-01-10 DIAGNOSIS — Z7189 Other specified counseling: Secondary | ICD-10-CM | POA: Diagnosis not present

## 2021-01-10 DIAGNOSIS — U071 COVID-19: Secondary | ICD-10-CM | POA: Diagnosis not present

## 2021-01-10 LAB — ANA COMPREHENSIVE PANEL

## 2021-01-10 LAB — BASIC METABOLIC PANEL
Anion gap: 12 (ref 5–15)
BUN: 34 mg/dL — ABNORMAL HIGH (ref 8–23)
CO2: 32 mmol/L (ref 22–32)
Calcium: 7.9 mg/dL — ABNORMAL LOW (ref 8.9–10.3)
Chloride: 97 mmol/L — ABNORMAL LOW (ref 98–111)
Creatinine, Ser: 0.8 mg/dL (ref 0.44–1.00)
GFR, Estimated: >60 mL/min (ref >60)
Glucose, Bld: 177 mg/dL — ABNORMAL HIGH (ref 70–99)
Potassium: 3.3 mmol/L — ABNORMAL LOW (ref 3.5–5.1)
Sodium: 141 mmol/L (ref 135–145)

## 2021-01-10 LAB — C-REACTIVE PROTEIN: CRP: 7.4 mg/dL — ABNORMAL HIGH (ref <1.0)

## 2021-01-10 LAB — PROCALCITONIN: Procalcitonin: <0.1 ng/mL

## 2021-01-10 LAB — RHEUMATOID FACTOR: Rheumatoid fact SerPl-aCnc: <10 IU/mL (ref <14.0)

## 2021-01-10 MED ORDER — POTASSIUM CHLORIDE CRYS ER 20 MEQ PO TBCR
40.0000 meq | EXTENDED_RELEASE_TABLET | Freq: Two times a day (BID) | ORAL | Status: AC
  Administered 2021-01-10 (×2): 40 meq via ORAL
  Filled 2021-01-10 (×2): qty 2

## 2021-01-10 NOTE — Progress Notes (Addendum)
Progress Note    Elizabeth Rowland  KGY:[id number] DOB: [birth year]  DOA: 01/06/2021 PCP: Leanna Sato, MD      Brief Narrative:    Medical records reviewed and are as summarized below:  Elizabeth Rowland is a 80 y.o. female       Assessment/Plan:   Active Problems:   Severe sepsis with acute organ dysfunction (HCC)   Hyperlipidemia   Fibromyalgia   Essential hypertension   Acute respiratory failure with hypoxia (HCC)   Acute respiratory distress syndrome (ARDS) due to COVID-19 virus [facility])   Pulmonary emboli (HCC)   Community acquired pneumonia   Lung mass   Nutrition Problem: Inadequate oral intake Etiology: poor appetite  Signs/Symptoms: meal completion < 25%,per patient/family report   Body mass index is 36.09 kg/m.  (Morbid obesity)   Severe sepsis, acute hypoxemic respiratory failure, pneumonia, ARDS: Continue oxygen via high flow nasal cannula (70%, 40 L/min).  Continue antibiotics.  Pulmonologist recommended trial of high-dose steroids  Bilateral pulmonary embolism, right peroneal DVT: Continue Eliquis  Elevated troponin: Secondary to demand ischemia  Hypokalemia: Replete potassium and monitor levels  Right mid to lower lung mass-like opacity on chest x-ray: Not seen on CT chest.  Outpatient follow-up recommended.  Thyroid nodule: Outpatient follow-up recommended     Diet Order            Diet regular Room service appropriate? Yes with Assist; Fluid consistency: Thin  Diet effective now                    Consultants:  Vascular surgeon  Pulmonologist  Procedures:  None    Medications:   . apixaban  10 mg Oral BID   Followed by  . [START ON 01/16/2021] apixaban  5 mg Oral BID  . feeding supplement  237 mL Oral TID BM  . furosemide  40 mg Intravenous Q12H  . gabapentin  300 mg Oral TID  . multivitamin with minerals  1 tablet Oral Daily  . potassium chloride  40 mEq Oral BID  . pravastatin  10 mg Oral Daily    Continuous Infusions: . azithromycin 500 mg (01/10/21 1510)  . ceFEPime (MAXIPIME) IV 2 g (01/10/21 1203)  . methylPREDNISolone (SOLU-MEDROL) injection 500 mg (01/10/21 1005)     Anti-infectives (From admission, onward)   Start     Dose/Rate Route Frequency Ordered Stop   01/09/21 1230  ceFEPIme (MAXIPIME) 2 g in sodium chloride 0.9 % 100 mL IVPB        2 g 200 mL/hr over 30 Minutes Intravenous Every 8 hours 01/09/21 1201     01/07/21 1500  cefTRIAXone (ROCEPHIN) 2 g in sodium chloride 0.9 % 100 mL IVPB  Status:  Discontinued        2 g 200 mL/hr over 30 Minutes Intravenous Every 24 hours 01/06/21 1524 01/07/21 1207   01/07/21 1500  azithromycin (ZITHROMAX) 500 mg in sodium chloride 0.9 % 250 mL IVPB        500 mg 250 mL/hr over 60 Minutes Intravenous Every 24 hours 01/06/21 1524     01/06/21 1515  cefTRIAXone (ROCEPHIN) 2 g in sodium chloride 0.9 % 100 mL IVPB        2 g 200 mL/hr over 30 Minutes Intravenous  Once 01/06/21 1506 01/06/21 1546   01/06/21 1515  azithromycin (ZITHROMAX) 500 mg in sodium chloride 0.9 % 250 mL IVPB        500 mg 250 mL/hr  over 60 Minutes Intravenous  Once 01/06/21 1506 01/06/21 1645             Family Communication/Anticipated D/C date and plan/Code Status   DVT prophylaxis: Place TED hose Start: 01/06/21 1523 apixaban (ELIQUIS) tablet 10 mg  apixaban (ELIQUIS) tablet 5 mg     Code Status: Full Code  Family Communication: [name], son at the bedside Disposition Plan:    Status is: Inpatient  Remains inpatient appropriate because:Inpatient level of care appropriate due to severity of illness   Dispo: The patient is from: Home              Anticipated d/c is to: Home              Patient currently is not medically stable to d/c.   Difficult to place patient No           Subjective:   C/o shortness of breath and discomfort from laying on bed.  Her son, [name], was at the bedside.  Objective:    Vitals:   01/10/21 0523  01/10/21 1008 01/10/21 1356 01/10/21 1513  BP: (!) 134/58 (!) 151/55  (!) 144/63  Pulse: 70 88  79  Resp:  18  18  Temp: 98 F (36.7 C) 98.2 F (36.8 C)  98 F (36.7 C)  TempSrc: Oral Oral  Oral  SpO2: 96% 97% 94% 92%  Weight: 101.4 kg     Height:       No data found.   Intake/Output Summary (Last 24 hours) at 01/10/2021 1528 Last data filed at 01/10/2021 1203 Gross per 24 hour  Intake 954 ml  Output 1000 ml  Net -46 ml   Filed Weights   01/06/21 1301 01/06/21 1707 01/10/21 0523  Weight: 100 kg 99.5 kg 101.4 kg    Exam:  GEN: NAD SKIN: Warm and dry. EYES: EOMI ENT: MMM CV: RRR PULM: Bibasilar rales ABD: soft, obese, NT, +BS CNS: AAO x 3, non focal EXT: Chronic bilateral leg erythema.  No edema or tenderness        Data Reviewed:   I have personally reviewed following labs and imaging studies:  Labs: Labs show the following:   Basic Metabolic Panel: Recent Labs  Lab 01/06/21 1305 01/07/21 0456 01/08/21 0706 01/09/21 0427 01/10/21 0530  NA 139 142 139 137 141  K 4.0 3.6 3.4* 3.0* 3.3*  CL 103 107 99 97* 97*  CO2 32  GLUCOSE 139* 119* 166* 171* 177*  BUN 30* 34*  CREATININE 0.89 0.78 0.80 0.66 0.80  CALCIUM 7.9* 7.6* 7.6* 7.6* 7.9*  MG  --   --  2.0 2.0  --   PHOS  --   --   --  3.9  --    GFR Estimated Creatinine Clearance: 66.3 mL/min (by C-G formula based on SCr of 0.8 mg/dL). Liver Function Tests: Recent Labs  Lab 01/07/21 0456  AST 34  ALT 17  ALKPHOS 52  BILITOT 2.7*  PROT 6.1*  ALBUMIN 2.4*   No results for input(s): LIPASE, AMYLASE in the last 168 hours. No results for input(s): AMMONIA in the last 168 hours. Coagulation profile Recent Labs  Lab 01/06/21 1644 01/07/21 0456  INR 1.6* 1.6*    CBC: Recent Labs  Lab 01/06/21 1305 01/07/21 0456 01/08/21 0706 01/09/21 0427  WBC 17.3* 19.1* 12.9* 13.3*  NEUTROABS 9.9*  --  10.3*  --   HGB 15.6* 14.6 15.4* 14.9  HCT 49.8*  47.3* 49.2* 47.1*  MCV  95.0 95.7 95.5 94.0  PLT 123* 120* 118* 168   Cardiac Enzymes: No results for input(s): CKTOTAL, CKMB, CKMBINDEX, TROPONINI in the last 168 hours. BNP (last 3 results) No results for input(s): PROBNP in the last 8760 hours. CBG: No results for input(s): GLUCAP in the last 168 hours. D-Dimer: No results for input(s): DDIMER in the last 72 hours. Hgb A1c: Recent Labs    01/09/21 0427  HGBA1C 6.9*   Lipid Profile: No results for input(s): CHOL, HDL, LDLCALC, TRIG, CHOLHDL, LDLDIRECT in the last 72 hours. Thyroid function studies: No results for input(s): TSH, T4TOTAL, T3FREE, THYROIDAB in the last 72 hours.  Invalid input(s): FREET3 Anemia work up: No results for input(s): VITAMINB12, FOLATE, FERRITIN, TIBC, IRON, RETICCTPCT in the last 72 hours. Sepsis Labs: Recent Labs  Lab 01/06/21 1305 01/06/21 1741 01/06/21 2020 01/07/21 0456 01/08/21 0706 01/09/21 0427 01/10/21 0530  PROCALCITON  --   --   --  0.10  --  <0.10 <0.10  WBC 17.3*  --   --  19.1* 12.9* 13.3*  --   LATICACIDVEN 1.9 2.2* 1.8  --   --   --   --     Microbiology Recent Results (from the past 240 hour(s))  Resp Panel by RT-PCR (Flu A&B, Covid) Nasopharyngeal Swab     Status: None   Collection Time: 01/06/21  1:05 PM   Specimen: Nasopharyngeal Swab; Nasopharyngeal(NP) swabs in vial transport medium  Result Value Ref Range Status   SARS Coronavirus 2 by RT PCR NEGATIVE NEGATIVE Final    Comment: (NOTE) SARS-CoV-2 target nucleic acids are NOT DETECTED.  The SARS-CoV-2 RNA is generally detectable in upper respiratory specimens during the acute phase of infection. The lowest concentration of SARS-CoV-2 viral copies this assay can detect is 138 copies/mL. A negative result does not preclude SARS-Cov-2 infection and should not be used as the sole basis for treatment or other patient management decisions. A negative result may occur with  improper specimen collection/handling, submission of specimen  other than nasopharyngeal swab, presence of viral mutation(s) within the areas targeted by this assay, and inadequate number of viral copies(<138 copies/mL). A negative result must be combined with clinical observations, patient history, and epidemiological information. The expected result is Negative.  Fact Sheet for Patients:  BloggerCourse.comhttps://www.fda.gov/media/152166/download  Fact Sheet for Healthcare Providers:  SeriousBroker.ithttps://www.fda.gov/media/152162/download  This test is no t yet approved or cleared by the [place] FDA and  has been authorized for detection and/or diagnosis of SARS-CoV-2 by FDA under an Emergency Use Authorization (EUA). This EUA will remain  in effect (meaning this test can be used) for the duration of the COVID-19 declaration under Section 564(b)(1) of the Act, 21 U.S.C.section 360bbb-3(b)(1), unless the authorization is terminated  or revoked sooner.       Influenza A by PCR NEGATIVE NEGATIVE Final   Influenza B by PCR NEGATIVE NEGATIVE Final    Comment: (NOTE) The Xpert Xpress SARS-CoV-2/FLU/RSV plus assay is intended as an aid in the diagnosis of influenza from Nasopharyngeal swab specimens and should not be used as a sole basis for treatment. Nasal washings and aspirates are unacceptable for Xpert Xpress SARS-CoV-2/FLU/RSV testing.  Fact Sheet for Patients: BloggerCourse.comhttps://www.fda.gov/media/152166/download  Fact Sheet for Healthcare Providers: SeriousBroker.ithttps://www.fda.gov/media/152162/download  This test is not yet approved or cleared by the [place] FDA and has been authorized for detection and/or diagnosis of SARS-CoV-2 by FDA under an Emergency Use Authorization (EUA). This EUA will remain in effect (meaning  this test can be used) for the duration of the COVID-19 declaration under Section 564(b)(1) of the Act, 21 U.S.C. section 360bbb-3(b)(1), unless the authorization is terminated or revoked.  Performed at [facility], [address]  [place], [place] [address]   Culture, blood (routine x 2)     Status: None (Preliminary result)   Collection Time: 01/06/21  3:00 PM   Specimen: BLOOD  Result Value Ref Range Status   Specimen Description BLOOD  LEFT AC  Final   Special Requests   Final    BOTTLES DRAWN AEROBIC AND ANAEROBIC Blood Culture results may not be optimal due to an inadequate volume of blood received in culture bottles   Culture   Final    NO GROWTH 4 DAYS Performed at [facility], [address], [place], [place] [address]    Report Status PENDING  Incomplete  Culture, blood (routine x 2)     Status: None (Preliminary result)   Collection Time: 01/06/21  3:00 PM   Specimen: BLOOD  Result Value Ref Range Status   Specimen Description BLOOD  RIGHT [facility]  Final   Special Requests   Final    BOTTLES DRAWN AEROBIC AND ANAEROBIC Blood Culture results may not be optimal due to an inadequate volume of blood received in culture bottles   Culture   Final    NO GROWTH 4 DAYS Performed at [facility], [address] [place], [place] [address]    Report Status PENDING  Incomplete  Respiratory (~20 pathogens) panel by PCR     Status: None   Collection Time: 01/07/21  8:03 AM   Specimen: Nasopharyngeal Swab; Respiratory  Result Value Ref Range Status   Adenovirus NOT DETECTED NOT DETECTED Final   Coronavirus 229E NOT DETECTED NOT DETECTED Final    Comment: (NOTE) The Coronavirus on the Respiratory Panel, DOES NOT test for the novel  Coronavirus (2019 nCoV)    Coronavirus HKU1 NOT DETECTED NOT DETECTED Final   Coronavirus NL63 NOT DETECTED NOT DETECTED Final   Coronavirus OC43 NOT DETECTED NOT DETECTED Final   Metapneumovirus NOT DETECTED NOT DETECTED Final   Rhinovirus / Enterovirus NOT DETECTED NOT DETECTED Final   Influenza A NOT DETECTED NOT DETECTED Final   Influenza B NOT DETECTED NOT DETECTED Final   Parainfluenza Virus 1 NOT DETECTED NOT DETECTED Final   Parainfluenza Virus 2 NOT DETECTED  NOT DETECTED Final   Parainfluenza Virus 3 NOT DETECTED NOT DETECTED Final   Parainfluenza Virus 4 NOT DETECTED NOT DETECTED Final   Respiratory Syncytial Virus NOT DETECTED NOT DETECTED Final   Bordetella pertussis NOT DETECTED NOT DETECTED Final   Bordetella Parapertussis NOT DETECTED NOT DETECTED Final   Chlamydophila pneumoniae NOT DETECTED NOT DETECTED Final   Mycoplasma pneumoniae NOT DETECTED NOT DETECTED Final    Comment: Performed at [facility], [address] [place], [place] [address]  MRSA PCR Screening     Status: None   Collection Time: 01/09/21 12:41 PM   Specimen: Nasopharyngeal  Result Value Ref Range Status   MRSA by PCR NEGATIVE NEGATIVE Final    Comment:        The GeneXpert MRSA Assay (FDA approved for NASAL specimens only), is one component of a comprehensive MRSA colonization surveillance program. It is not intended to diagnose MRSA infection nor to guide or monitor treatment for MRSA infections. Performed at [facility], [address] [place], [place] [address]     Procedures and  diagnostic studies:  No results found.             LOS: 3 days   Keriana Sarsfield  [facility]   Pager on [email or web address]. If 7PM-7AM, please contact night-coverage at [email or web address]     01/10/2021, 3:28 PM

## 2021-01-10 NOTE — Consult Note (Addendum)
Consultation Note Date: 01/10/2021   Patient Name: Elizabeth Rowland  DOB: 1941/06/09  MRN: [id number]  Age / Sex: 80 y.o., female  PCP: Leanna Sato, MD Referring Physician: Lurene Shadow, MD  Reason for Consultation: Establishing goals of care  HPI/Patient Profile:Elizabeth Rowland is a 80 y.o. female with a past history of hyperlipidemia, GERD, hypertension, obesity who comes the ED complaining of shortness of breath for the past [redacted] weeks along with loss of taste.   Clinical Assessment and Goals of Care: Chart reviewed. Patient is resting in bed on high flow cannula. She states she is divorced and has 2 sons. She states one of her sons has always lived with her. No family at bedside at this time.    She shares that she does the cooking and cleaning and her son helps out with other chores around the house. [name] tells me she does not use any assistive devices or O2. Prior to 3 weeks ago she was fully independent. She states she does not know what happened, she felt fine but just became increasingly SOB over these past 3 weeks.  We discussed her diagnoses and possible prognosis.  A detailed discussion was had today regarding advanced directives.  Concepts specific to code status, artifical feeding and hydration, IV antibiotics and rehospitalization were discussed.  The difference between an aggressive medical intervention path and a comfort care path was discussed.  Values and goals of care important to patient and family were attempted to be elicited.  Discussed limitations of medical interventions to prolong quality of life in some situations and discussed the concept of human mortality. Discussed multiple scenarios, and she is aware her O2 needs are currently at levels that cannot be offered at home or in a SNF. She states she would not be accepting of a QOL that would not allow her to live at  home with her son.   She states she is 5 years old. She states "'if it's time to go, it's time to go." She states if her breathing were to worsen, she would not want to be placed on a ventilator. She also indicates she would not want chest compressions, shocks, or a breathing tube, and would want to leave this earth peacefully and naturally if her breathing or heart were in a rhythm that would warrant these interventions.   She then indicates she would like to still think about this and speak with her son, and does not want any orders changed at this time from full code/full scope.      SUMMARY OF RECOMMENDATIONS   Continue full code full scope. Patient is considering DNR/DNI status. Will need to see how she does with the high flow for further planning. Will continue to follow.    Prognosis:   Poor overall.       Primary Diagnoses: Present on Admission: . Severe sepsis with acute organ dysfunction (HCC) . Hyperlipidemia   I have reviewed the medical record, interviewed the patient and family, and examined the patient.  The following aspects are pertinent.  History reviewed. No pertinent past medical history. Social History   Socioeconomic History  . Marital status: Married    Spouse name: Not on file  . Number of children: Not on file  . Years of education: Not on file  . Highest education level: Not on file  Occupational History  . Not on file  Tobacco Use  . Smoking status: Never Smoker  . Smokeless tobacco: Never Used  Substance and Sexual Activity  . Alcohol use: Never  . Drug use: Never  . Sexual activity: Not Currently  Other Topics Concern  . Not on file  Social History Narrative  . Not on file   Social Determinants of Health   Financial Resource Strain: Not on file  Food Insecurity: Not on file  Transportation Needs: Not on file  Physical Activity: Not on file  Stress: Not on file  Social Connections: Not on file   History reviewed. No pertinent family  history. Scheduled Meds: . apixaban  10 mg Oral BID   Followed by  . [START ON 01/16/2021] apixaban  5 mg Oral BID  . feeding supplement  237 mL Oral TID BM  . furosemide  40 mg Intravenous Q12H  . gabapentin  300 mg Oral TID  . multivitamin with minerals  1 tablet Oral Daily  . potassium chloride  40 mEq Oral BID  . pravastatin  10 mg Oral Daily   Continuous Infusions: . azithromycin 500 mg (01/10/21 1510)  . ceFEPime (MAXIPIME) IV 2 g (01/10/21 1203)  . methylPREDNISolone (SOLU-MEDROL) injection 500 mg (01/10/21 1005)   PRN Meds:.acetaminophen **OR** acetaminophen, hydrALAZINE, ondansetron **OR** ondansetron (ZOFRAN) IV, traMADol Medications Prior to Admission:  Prior to Admission medications   Medication Sig Start Date End Date Taking? Authorizing Provider  Acetaminophen 500 MG capsule Take 500 mg by mouth every 4 (four) hours as needed.   Yes [provider]  gabapentin (NEURONTIN) 300 MG capsule Take 1 capsule by mouth 3 (three) times daily. 11/24/20  Yes [provider]  pravastatin (PRAVACHOL) 10 MG tablet Take 10 mg by mouth daily. 03/26/14  Yes [provider]  traMADol (ULTRAM) 50 MG tablet Take 50 mg by mouth 3 (three) times daily as needed. 11/24/20  Yes [provider]   Not on File Review of Systems  All other systems reviewed and are negative.   Physical Exam Pulmonary:     Comments: On high flow O2.  Neurological:     Mental Status: [name].     Vital Signs: BP (!) 144/63 (BP Location: Left Arm)   Pulse 79   Temp 98 F (36.7 C) (Oral)   Resp 18   Ht 5\' 6"  (1.676 m)   Wt 101.4 kg   SpO2 92%   BMI 36.09 kg/m  Pain Scale: 0-10 POSS *See Group Information*: S-Acceptable,Sleep, easy to arouse Pain Score: 2    SpO2: SpO2: 92 % O2 Device:SpO2: 92 % O2 Flow Rate: .O2 Flow Rate (L/min): 40 L/min  IO: Intake/output summary:   Intake/Output Summary (Last 24 hours) at 01/10/2021 1523 Last data filed at 01/10/2021  1203 Gross per 24 hour  Intake 954 ml  Output 1000 ml  Net -46 ml    LBM: Last BM Date: 01/06/21 Baseline Weight: Weight: 100 kg Most recent weight: Weight: 101.4 kg         Time In: 2:20 Time Out: 3:10 Time Total: 50 min Greater than 50%  of this time was  spent counseling and coordinating care related to the above assessment and plan.  Signed by: Morton Stall, NP   Please contact Palliative Medicine Team phone at [phone] for questions and concerns.  For individual provider: See Loretha Stapler

## 2021-01-10 NOTE — Care Management Important Message (Signed)
Important Message  Patient Details  Name: Elizabeth Rowland MRN: [id number] Date of Birth: 07-Feb-1941   Medicare Important Message Given:  Yes     [name] 01/10/2021, 10:49 AM

## 2021-01-11 DIAGNOSIS — M797 Fibromyalgia: Secondary | ICD-10-CM | POA: Diagnosis not present

## 2021-01-11 DIAGNOSIS — U071 COVID-19: Secondary | ICD-10-CM | POA: Diagnosis not present

## 2021-01-11 DIAGNOSIS — J9601 Acute respiratory failure with hypoxia: Secondary | ICD-10-CM | POA: Diagnosis not present

## 2021-01-11 DIAGNOSIS — J189 Pneumonia, unspecified organism: Secondary | ICD-10-CM | POA: Diagnosis not present

## 2021-01-11 DIAGNOSIS — Z7189 Other specified counseling: Secondary | ICD-10-CM | POA: Diagnosis not present

## 2021-01-11 LAB — CBC WITH DIFFERENTIAL/PLATELET
Abs Immature Granulocytes: 0.24 10*3/uL — ABNORMAL HIGH (ref 0.00–0.07)
Basophils Absolute: 0 10*3/uL (ref 0.0–0.1)
Basophils Relative: 0 %
Eosinophils Absolute: 0 10*3/uL (ref 0.0–0.5)
Eosinophils Relative: 0 %
HCT: 48 % — ABNORMAL HIGH (ref 36.0–46.0)
Hemoglobin: 15.1 g/dL — ABNORMAL HIGH (ref 12.0–15.0)
Immature Granulocytes: 2 %
Lymphocytes Relative: 4 %
Lymphs Abs: 0.5 10*3/uL — ABNORMAL LOW (ref 0.7–4.0)
MCH: 29.7 pg (ref 26.0–34.0)
MCHC: 31.5 g/dL (ref 30.0–36.0)
MCV: 94.5 fL (ref 80.0–100.0)
Monocytes Absolute: 2.8 10*3/uL — ABNORMAL HIGH (ref 0.1–1.0)
Monocytes Relative: 24 %
Neutro Abs: 8.3 10*3/uL — ABNORMAL HIGH (ref 1.7–7.7)
Neutrophils Relative %: 70 %
Platelets: 171 10*3/uL (ref 150–400)
RBC: 5.08 MIL/uL (ref 3.87–5.11)
RDW: 16.7 % — ABNORMAL HIGH (ref 11.5–15.5)
WBC: 11.8 10*3/uL — ABNORMAL HIGH (ref 4.0–10.5)
nRBC: 0 % (ref 0.0–0.2)

## 2021-01-11 LAB — BASIC METABOLIC PANEL
Anion gap: 10 (ref 5–15)
BUN: 35 mg/dL — ABNORMAL HIGH (ref 8–23)
CO2: 33 mmol/L — ABNORMAL HIGH (ref 22–32)
Calcium: 7.7 mg/dL — ABNORMAL LOW (ref 8.9–10.3)
Chloride: 97 mmol/L — ABNORMAL LOW (ref 98–111)
Creatinine, Ser: 0.78 mg/dL (ref 0.44–1.00)
GFR, Estimated: >60 mL/min (ref >60)
Glucose, Bld: 191 mg/dL — ABNORMAL HIGH (ref 70–99)
Potassium: 3.5 mmol/L (ref 3.5–5.1)
Sodium: 140 mmol/L (ref 135–145)

## 2021-01-11 LAB — C-REACTIVE PROTEIN: CRP: 3.8 mg/dL — ABNORMAL HIGH (ref <1.0)

## 2021-01-11 LAB — ANCA TITERS
Atypical P-ANCA titer: 1:320 {titer} — ABNORMAL HIGH
C-ANCA: <1:20 {titer}
P-ANCA: <1:20 {titer}

## 2021-01-11 LAB — PROCALCITONIN: Procalcitonin: <0.1 ng/mL

## 2021-01-11 MED ORDER — TRAMADOL HCL 50 MG PO TABS: 50.0000 mg | ORAL_TABLET | Freq: Three times a day (TID) | ORAL | Status: DC | PRN

## 2021-01-11 MED ORDER — TRAMADOL HCL 50 MG PO TABS
100.0000 mg | ORAL_TABLET | Freq: Three times a day (TID) | ORAL | Status: DC | PRN
  Administered 2021-01-11 – 2021-01-17 (×13): 100 mg via ORAL
  Filled 2021-01-11 (×13): qty 2

## 2021-01-11 MED ORDER — POTASSIUM CHLORIDE CRYS ER 20 MEQ PO TBCR
40.0000 meq | EXTENDED_RELEASE_TABLET | Freq: Once | ORAL | Status: AC
  Administered 2021-01-11: 40 meq via ORAL
  Filled 2021-01-11: qty 2

## 2021-01-11 MED ORDER — SODIUM CHLORIDE 0.9 % IV SOLN
INTRAVENOUS | Status: DC | PRN
  Administered 2021-01-11 – 2021-01-12 (×5): 500 mL via INTRAVENOUS
  Administered 2021-01-13: 250 mL via INTRAVENOUS

## 2021-01-11 NOTE — Plan of Care (Signed)
  Problem: Health Behavior/Discharge Planning: Goal: Ability to manage health-related needs will improve Outcome: Not Progressing   Problem: Clinical Measurements: Goal: Diagnostic test results will improve Outcome: Not Progressing   Problem: Activity: Goal: Risk for activity intolerance will decrease Outcome: Not Progressing

## 2021-01-11 NOTE — Progress Notes (Signed)
Daily Progress Note   Patient Name: Elizabeth Rowland       Date: 01/11/2021 DOB: [birth year]  Age: 80 y.o. MRN#: [id number] Attending Physician: Lurene Shadow, MD Primary Care Physician: Leanna Sato, MD Admit Date: 01/06/2021  Reason for Consultation/Follow-up: Establishing goals of care  Subjective: Patient resting in bed on high flow cannula watching t.v. No family at bedside. She states she spoke with her son about her wishes regarding CPR and ventilator support. She states her son requested she not make any decisions without him present, and is now considering her feelings on these decisions.   Length of Stay: 4  Current Medications: Scheduled Meds:  . apixaban  10 mg Oral BID   Followed by  . [START ON 01/16/2021] apixaban  5 mg Oral BID  . feeding supplement  237 mL Oral TID BM  . furosemide  40 mg Intravenous Q12H  . gabapentin  300 mg Oral TID  . multivitamin with minerals  1 tablet Oral Daily  . pravastatin  10 mg Oral Daily    Continuous Infusions: . sodium chloride 500 mL (01/11/21 1518)  . azithromycin 500 mg (01/11/21 1519)  . ceFEPime (MAXIPIME) IV Stopped (01/11/21 1337)    PRN Meds: sodium chloride, acetaminophen **OR** acetaminophen, hydrALAZINE, ondansetron **OR** ondansetron (ZOFRAN) IV, traMADol  Physical Exam Pulmonary:     Comments: On high flow cannula.  Neurological:     Mental Status: [name].             Vital Signs: BP 140/73 (BP Location: Left Arm)   Pulse 80   Temp 98.4 F (36.9 C)   Resp 17   Ht 5\' 6"  (1.676 m)   Wt 98.4 kg   SpO2 94%   BMI 35.01 kg/m  SpO2: SpO2: 94 % O2 Device: O2 Device: High Flow Nasal Cannula O2 Flow Rate: O2 Flow Rate (L/min): 40 L/min  Intake/output summary:   Intake/Output Summary (Last 24  hours) at 01/11/2021 1543 Last data filed at 01/11/2021 1444 Gross per 24 hour  Intake 1828.02 ml  Output 4100 ml  Net -2271.98 ml   LBM: Last BM Date: 01/10/21 Baseline Weight: Weight: 100 kg Most recent weight: Weight: 98.4 kg        Patient Active Problem List   Diagnosis Date Noted  . Community  acquired pneumonia   . Lung mass   . Acute respiratory failure with hypoxia (HCC) 01/07/2021  . Acute respiratory distress syndrome (ARDS) due to COVID-19 virus (HCC) 01/07/2021  . Pulmonary emboli (HCC) 01/07/2021  . Severe sepsis with acute organ dysfunction (HCC) 01/06/2021  . Hyperlipidemia 01/06/2021  . Fibromyalgia 01/06/2021  . Essential hypertension 01/06/2021    Palliative Care Assessment & Plan    Recommendations/Plan:  Patient considering DNR/DNI status.   Code Status:    Code Status Orders  (From admission, onward)         Start     Ordered   01/06/21 1523  Full code  Continuous        01/06/21 1524        Code Status History    This patient has a current code status but no historical code status.   Advance Care Planning Activity       Prognosis:  Poor    Thank you for allowing the Palliative Medicine Team to assist in the care of this patient.   Total Time 15 min Prolonged Time Billed  no      Greater than 50%  of this time was spent counseling and coordinating care related to the above assessment and plan.  Morton Stall, NP  Please contact Palliative Medicine Team phone at [phone] for questions and concerns.

## 2021-01-11 NOTE — Progress Notes (Signed)
Progress Note    Elizabeth Rowland  XAJ:[id number] DOB: December 03, 1940  DOA: 01/06/2021 PCP: Leanna Sato, MD      Brief Narrative:    Medical records reviewed and are as summarized below:  Elizabeth Rowland is a 80 y.o. female       Assessment/Plan:   Active Problems:   Severe sepsis with acute organ dysfunction (HCC)   Hyperlipidemia   Fibromyalgia   Essential hypertension   Acute respiratory failure with hypoxia (HCC)   Acute respiratory distress syndrome (ARDS) due to COVID-19 virus [facility])   Pulmonary emboli (HCC)   Community acquired pneumonia   Lung mass   Nutrition Problem: Inadequate oral intake Etiology: poor appetite  Signs/Symptoms: meal completion < 25%,per patient/family report   Body mass index is 35.01 kg/m.  (Morbid obesity)   Severe sepsis, acute hypoxemic respiratory failure, pneumonia, ARDS: Hypoxia is slowly improving.  Continue oxygen via HFNC (down from 70% to 50% and 40 L/min).  Continue antibiotics through 01/12/2021.  Completed 3-day course of high-dose steroids on 01/10/2021.    Bilateral pulmonary embolism, right peroneal DVT: Continue Eliquis  Elevated troponin: Secondary to demand ischemia  Hypokalemia: Improved.  Continue potassium repletion.  Right mid to lower lung mass-like opacity on chest x-ray: Not seen on CT chest.  Outpatient follow-up recommended.  Thyroid nodule: Outpatient follow-up recommended  Chronic pain/fibromyalgia: Continue tramadol   Diet Order            Diet regular Room service appropriate? Yes with Assist; Fluid consistency: Thin  Diet effective now                    Consultants:  Vascular surgeon  Pulmonologist  Procedures:  None    Medications:   . apixaban  10 mg Oral BID   Followed by  . [START ON 01/16/2021] apixaban  5 mg Oral BID  . feeding supplement  237 mL Oral TID BM  . furosemide  40 mg Intravenous Q12H  . gabapentin  300 mg Oral TID  . multivitamin with  minerals  1 tablet Oral Daily  . pravastatin  10 mg Oral Daily   Continuous Infusions: . sodium chloride Stopped (01/11/21 1306)  . azithromycin 250 mL/hr at 01/11/21 1308  . ceFEPime (MAXIPIME) IV 200 mL/hr at 01/11/21 1308     Anti-infectives (From admission, onward)   Start     Dose/Rate Route Frequency Ordered Stop   01/09/21 1230  ceFEPIme (MAXIPIME) 2 g in sodium chloride 0.9 % 100 mL IVPB        2 g 200 mL/hr over 30 Minutes Intravenous Every 8 hours 01/09/21 1201     01/07/21 1500  cefTRIAXone (ROCEPHIN) 2 g in sodium chloride 0.9 % 100 mL IVPB  Status:  Discontinued        2 g 200 mL/hr over 30 Minutes Intravenous Every 24 hours 01/06/21 1524 01/07/21 1207   01/07/21 1500  azithromycin (ZITHROMAX) 500 mg in sodium chloride 0.9 % 250 mL IVPB        500 mg 250 mL/hr over 60 Minutes Intravenous Every 24 hours 01/06/21 1524     01/06/21 1515  cefTRIAXone (ROCEPHIN) 2 g in sodium chloride 0.9 % 100 mL IVPB        2 g 200 mL/hr over 30 Minutes Intravenous  Once 01/06/21 1506 01/06/21 1546   01/06/21 1515  azithromycin (ZITHROMAX) 500 mg in sodium chloride 0.9 % 250 mL IVPB  500 mg 250 mL/hr over 60 Minutes Intravenous  Once 01/06/21 1506 01/06/21 1645             Family Communication/Anticipated D/C date and plan/Code Status   DVT prophylaxis: Place TED hose Start: 01/06/21 1523 apixaban (ELIQUIS) tablet 10 mg  apixaban (ELIQUIS) tablet 5 mg     Code Status: Full Code  Family Communication: [name], son at the bedside Disposition Plan:    Status is: Inpatient  Remains inpatient appropriate because:Inpatient level of care appropriate due to severity of illness   Dispo: The patient is from: Home              Anticipated d/c is to: Home              Patient currently is not medically stable to d/c.   Difficult to place patient No           Subjective:   C/o right-sided rib pain which she says is chronic.  She said that tramadol had been  discontinued and she wanted it to be resumed.  Breathing is okay.  Her son, [name], was at the bedside.  Objective:    Vitals:   01/11/21 0211 01/11/21 0440 01/11/21 0500 01/11/21 1151  BP:  140/65  138/77  Pulse:  78  80  Resp:    18  Temp:  98.3 F (36.8 C)  98.5 F (36.9 C)  TempSrc:  Oral  Axillary  SpO2: 93% 93%  92%  Weight:   98.4 kg   Height:       No data found.   Intake/Output Summary (Last 24 hours) at 01/11/2021 1412 Last data filed at 01/11/2021 1348 Gross per 24 hour  Intake 1723.9 ml  Output 3400 ml  Net -1676.1 ml   Filed Weights   01/06/21 1707 01/10/21 0523 01/11/21 0500  Weight: 99.5 kg 101.4 kg 98.4 kg    Exam:  GEN: NAD SKIN: Warm and dry.  Chronic erythematous plaque-like lesions on bilateral legs EYES: EOMI ENT: MMM CV: RRR PULM: Bibasilar rales ABD: soft, obese, NT, +BS CNS: AAO x 3, non focal EXT: No edema or tenderness    Data Reviewed:   I have personally reviewed following labs and imaging studies:  Labs: Labs show the following:   Basic Metabolic Panel: Recent Labs  Lab 01/07/21 0456 01/08/21 0706 01/09/21 0427 01/10/21 0530 01/11/21 0606  NA 142 139 137 141 140  K 3.6 3.4* 3.0* 3.3* 3.5  CL 107 99 97* 97* 97*  CO2 25 28 30  32 33*  GLUCOSE 119* 166* 171* 177* 191*  BUN 17 22 30* 34* 35*  CREATININE 0.78 0.80 0.66 0.80 0.78  CALCIUM 7.6* 7.6* 7.6* 7.9* 7.7*  MG  --  2.0 2.0  --   --   PHOS  --   --  3.9  --   --    GFR Estimated Creatinine Clearance: 65.2 mL/min (by C-G formula based on SCr of 0.78 mg/dL). Liver Function Tests: Recent Labs  Lab 01/07/21 0456  AST 34  ALT 17  ALKPHOS 52  BILITOT 2.7*  PROT 6.1*  ALBUMIN 2.4*   No results for input(s): LIPASE, AMYLASE in the last 168 hours. No results for input(s): AMMONIA in the last 168 hours. Coagulation profile Recent Labs  Lab 01/06/21 1644 01/07/21 0456  INR 1.6* 1.6*    CBC: Recent Labs  Lab 01/06/21 1305 01/07/21 0456 01/08/21 0706  01/09/21 0427 01/11/21 0606  WBC 17.3* 19.1* 12.9* 13.3* 11.8*  NEUTROABS 9.9*  --  10.3*  --  8.3*  HGB 15.6* 14.6 15.4* 14.9 15.1*  HCT 49.8* 47.3* 49.2* 47.1* 48.0*  MCV 95.0 95.7 95.5 94.0 94.5  PLT 123* 120* 118* 168 171   Cardiac Enzymes: No results for input(s): CKTOTAL, CKMB, CKMBINDEX, TROPONINI in the last 168 hours. BNP (last 3 results) No results for input(s): PROBNP in the last 8760 hours. CBG: No results for input(s): GLUCAP in the last 168 hours. D-Dimer: No results for input(s): DDIMER in the last 72 hours. Hgb A1c: Recent Labs    01/09/21 0427  HGBA1C 6.9*   Lipid Profile: No results for input(s): CHOL, HDL, LDLCALC, TRIG, CHOLHDL, LDLDIRECT in the last 72 hours. Thyroid function studies: No results for input(s): TSH, T4TOTAL, T3FREE, THYROIDAB in the last 72 hours.  Invalid input(s): FREET3 Anemia work up: No results for input(s): VITAMINB12, FOLATE, FERRITIN, TIBC, IRON, RETICCTPCT in the last 72 hours. Sepsis Labs: Recent Labs  Lab 01/06/21 1305 01/06/21 1741 01/06/21 2020 01/07/21 0456 01/08/21 0706 01/09/21 0427 01/10/21 0530 01/11/21 0606  PROCALCITON  --   --   --  0.10  --  <0.10 <0.10 <0.10  WBC 17.3*  --   --  19.1* 12.9* 13.3*  --  11.8*  LATICACIDVEN 1.9 2.2* 1.8  --   --   --   --   --     Microbiology Recent Results (from the past 240 hour(s))  Resp Panel by RT-PCR (Flu A&B, Covid) Nasopharyngeal Swab     Status: None   Collection Time: 01/06/21  1:05 PM   Specimen: Nasopharyngeal Swab; Nasopharyngeal(NP) swabs in vial transport medium  Result Value Ref Range Status   SARS Coronavirus 2 by RT PCR NEGATIVE NEGATIVE Final    Comment: (NOTE) SARS-CoV-2 target nucleic acids are NOT DETECTED.  The SARS-CoV-2 RNA is generally detectable in upper respiratory specimens during the acute phase of infection. The lowest concentration of SARS-CoV-2 viral copies this assay can detect is 138 copies/mL. A negative result does not preclude  SARS-Cov-2 infection and should not be used as the sole basis for treatment or other patient management decisions. A negative result may occur with  improper specimen collection/handling, submission of specimen other than nasopharyngeal swab, presence of viral mutation(s) within the areas targeted by this assay, and inadequate number of viral copies(<138 copies/mL). A negative result must be combined with clinical observations, patient history, and epidemiological information. The expected result is Negative.  Fact Sheet for Patients:  BloggerCourse.comhttps://www.fda.gov/media/152166/download  Fact Sheet for Healthcare Providers:  SeriousBroker.ithttps://www.fda.gov/media/152162/download  This test is no t yet approved or cleared by the [place] FDA and  has been authorized for detection and/or diagnosis of SARS-CoV-2 by FDA under an Emergency Use Authorization (EUA). This EUA will remain  in effect (meaning this test can be used) for the duration of the COVID-19 declaration under Section 564(b)(1) of the Act, 21 U.S.C.section 360bbb-3(b)(1), unless the authorization is terminated  or revoked sooner.       Influenza A by PCR NEGATIVE NEGATIVE Final   Influenza B by PCR NEGATIVE NEGATIVE Final    Comment: (NOTE) The Xpert Xpress SARS-CoV-2/FLU/RSV plus assay is intended as an aid in the diagnosis of influenza from Nasopharyngeal swab specimens and should not be used as a sole basis for treatment. Nasal washings and aspirates are unacceptable for Xpert Xpress SARS-CoV-2/FLU/RSV testing.  Fact Sheet for Patients: BloggerCourse.comhttps://www.fda.gov/media/152166/download  Fact Sheet for Healthcare Providers: SeriousBroker.ithttps://www.fda.gov/media/152162/download  This test is not yet approved or cleared by  the [employer] and has been authorized for detection and/or diagnosis of SARS-CoV-2 by FDA under an Emergency Use Authorization (EUA). This EUA will remain in effect (meaning this test can be used) for the duration of  the COVID-19 declaration under Section 564(b)(1) of the Act, 21 U.S.C. section 360bbb-3(b)(1), unless the authorization is terminated or revoked.  Performed at [facility], [address] [place], [place] [address]   Culture, blood (routine x 2)     Status: None (Preliminary result)   Collection Time: 01/06/21  3:00 PM   Specimen: BLOOD  Result Value Ref Range Status   Specimen Description BLOOD  LEFT AC  Final   Special Requests   Final    BOTTLES DRAWN AEROBIC AND ANAEROBIC Blood Culture results may not be optimal due to an inadequate volume of blood received in culture bottles   Culture   Final    NO GROWTH 4 DAYS Performed at [facility], [address], [place], [place] [address]    Report Status PENDING  Incomplete  Culture, blood (routine x 2)     Status: None (Preliminary result)   Collection Time: 01/06/21  3:00 PM   Specimen: BLOOD  Result Value Ref Range Status   Specimen Description BLOOD  RIGHT [facility]  Final   Special Requests   Final    BOTTLES DRAWN AEROBIC AND ANAEROBIC Blood Culture results may not be optimal due to an inadequate volume of blood received in culture bottles   Culture   Final    NO GROWTH 4 DAYS Performed at [facility], [address] [place], [place] [address]    Report Status PENDING  Incomplete  Respiratory (~20 pathogens) panel by PCR     Status: None   Collection Time: 01/07/21  8:03 AM   Specimen: Nasopharyngeal Swab; Respiratory  Result Value Ref Range Status   Adenovirus NOT DETECTED NOT DETECTED Final   Coronavirus 229E NOT DETECTED NOT DETECTED Final    Comment: (NOTE) The Coronavirus on the Respiratory Panel, DOES NOT test for the novel  Coronavirus (2019 nCoV)    Coronavirus HKU1 NOT DETECTED NOT DETECTED Final   Coronavirus NL63 NOT DETECTED NOT DETECTED Final   Coronavirus OC43 NOT DETECTED NOT DETECTED Final   Metapneumovirus NOT DETECTED NOT DETECTED Final   Rhinovirus / Enterovirus NOT  DETECTED NOT DETECTED Final   Influenza A NOT DETECTED NOT DETECTED Final   Influenza B NOT DETECTED NOT DETECTED Final   Parainfluenza Virus 1 NOT DETECTED NOT DETECTED Final   Parainfluenza Virus 2 NOT DETECTED NOT DETECTED Final   Parainfluenza Virus 3 NOT DETECTED NOT DETECTED Final   Parainfluenza Virus 4 NOT DETECTED NOT DETECTED Final   Respiratory Syncytial Virus NOT DETECTED NOT DETECTED Final   Bordetella pertussis NOT DETECTED NOT DETECTED Final   Bordetella Parapertussis NOT DETECTED NOT DETECTED Final   Chlamydophila pneumoniae NOT DETECTED NOT DETECTED Final   Mycoplasma pneumoniae NOT DETECTED NOT DETECTED Final    Comment: Performed at [facility], [address] [place], [place] [address]  MRSA PCR Screening     Status: None   Collection Time: 01/09/21 12:41 PM   Specimen: Nasopharyngeal  Result Value Ref Range Status   MRSA by PCR NEGATIVE NEGATIVE Final    Comment:        The GeneXpert MRSA Assay (FDA approved for NASAL specimens only), is one component of a comprehensive MRSA colonization surveillance program. It is not intended to diagnose  MRSA infection nor to guide or monitor treatment for MRSA infections. Performed at [facility], [address] [place], [place] [address]     Procedures and diagnostic studies:  No results found.             LOS: 4 days   Bosten Newstrom  [facility]   Pager on [email or web address]. If 7PM-7AM, please contact night-coverage at [email or web address]     01/11/2021, 2:12 PM

## 2021-01-11 NOTE — Evaluation (Signed)
Physical Therapy Evaluation Patient Details Name: Elizabeth Rowland MRN: [id number] DOB: November 26, 1940 Today's Date: 01/11/2021   History of Present Illness  80 y.o. female with medical history significant for fibromyalgia, history of hypertension, history of hyperlipidemia, currently on tramadol and gabapentin for chronic pain and fibromyalgia, no longer taking antihypertensives for pravastatin, presents to the emergency department for chief concerns of shortness of breath.  Found to have  acute respiratory failure, ARDS. CT angiogram showed multiple PE with extensive groundglass opacities as well as interstitial changes. COVID PCR negative  Clinical Impression  Patient agreeable to PT. No family at the bedside during evaluation. Patient reports she was independent at baseline and lives with her son.  Patient required maximal assistance for rolling to left side in bed and desaturated to 89% on 40L heated high flow with bed level activity. Elevated head of bed to assess physiological response with Sp02 decreasing to 89%. Patient also desaturated to 89% with LE exercises and required multiple rest breaks due to fatigue. Patient has generalized weakness and is deconditioned, requiring assistance with all mobility efforts at this time. Anticipate patient will need SNF placement at discharge, however this is an ongoing assessment based on progress with activity. Recommend to continue PT to maximize independence and facilitate return to prior level of function.     Follow Up Recommendations SNF    Equipment Recommendations   (to be determined at next level of care)    Recommendations for Other Services       Precautions / Restrictions Precautions Precautions: Fall Precaution Comments: per nurse, keep Sp02 above 85% Restrictions Weight Bearing Restrictions: No      Mobility  Bed Mobility Overal bed mobility: Needs Assistance Bed Mobility: Rolling Rolling: Max assist         General bed  mobility comments: Max A for rolling to left side. Patient desaturated to 89% with rolling. Cues for technique to facilitate increased independence with rolling and repositioning in the room. elevated head of the bed to 45 degrees to assess physiological response. Patient desaturated to 89% however increased to 90's with cues for breathing techniques    Transfers                    Ambulation/Gait                Stairs            Wheelchair Mobility    Modified Rankin (Stroke Patients Only)       Balance                                             Pertinent Vitals/Pain Pain Assessment: No/denies pain    Home Living Family/patient expects to be discharged to:: Private residence Living Arrangements: Children (son) Available Help at Discharge: Family   Home Access: Level entry     Home Layout: One level Home Equipment: None Additional Comments: information is per patient report    Prior Function Level of Independence: Independent               Hand Dominance        Extremity/Trunk Assessment   Upper Extremity Assessment Upper Extremity Assessment: Generalized weakness    Lower Extremity Assessment Lower Extremity Assessment: Generalized weakness       Communication   Communication: No difficulties  Cognition Arousal/Alertness: Awake/alert Behavior During  Therapy: WFL for tasks assessed/performed Overall Cognitive Status: Within Functional Limits for tasks assessed                                        General Comments      Exercises General Exercises - Lower Extremity Short Arc Quad: AAROM;Strengthening;Both;10 reps;Supine Heel Slides: AAROM;Strengthening;Both;10 reps;Supine Hip ABduction/ADduction: AAROM;Strengthening;Both;10 reps;Supine Other Exercises Other Exercises: verbal cues for technique for strengthening exercises.   Assessment/Plan    PT Assessment Patient needs continued  PT services  PT Problem List Decreased strength;Decreased range of motion;Decreased activity tolerance;Decreased balance;Decreased mobility;Cardiopulmonary status limiting activity       PT Treatment Interventions DME instruction;Gait training;Functional mobility training;Therapeutic activities;Therapeutic exercise;Neuromuscular re-education;Balance training;Patient/family education    PT Goals (Current goals can be found in the Care Plan section)  Acute Rehab PT Goals Patient Stated Goal: to go home PT Goal Formulation: With patient Time For Goal Achievement: 01/25/21 Potential to Achieve Goals: Fair    Frequency Min 2X/week   Barriers to discharge        Co-evaluation               AM-PAC PT "6 Clicks" Mobility  Outcome Measure Help needed turning from your back to your side while in a flat bed without using bedrails?: A Lot Help needed moving from lying on your back to sitting on the side of a flat bed without using bedrails?: A Lot Help needed moving to and from a bed to a chair (including a wheelchair)?: Total Help needed standing up from a chair using your arms (e.g., wheelchair or bedside chair)?: Total Help needed to walk in hospital room?: Total Help needed climbing 3-5 steps with a railing? : Total 6 Click Score: 8    End of Session Equipment Utilized During Treatment: Oxygen (40L02 via heated high flow nasal canula) Activity Tolerance: Patient limited by fatigue Patient left: in bed;with call bell/phone within reach;with bed alarm set Nurse Communication: Mobility status PT Visit Diagnosis: Muscle weakness (generalized) (M62.81);Unsteadiness on feet (R26.81);Difficulty in walking, not elsewhere classified (R26.2)    Time: 6213-0865 PT Time Calculation (min) (ACUTE ONLY): 16 min   Charges:   PT Evaluation $PT Eval Moderate Complexity: 1 Mod PT Treatments $Therapeutic Exercise: 8-22 mins       Donna Bernard, PT, MPT   Ina Homes 01/11/2021,  1:43 PM

## 2021-01-12 DIAGNOSIS — Z515 Encounter for palliative care: Secondary | ICD-10-CM | POA: Diagnosis not present

## 2021-01-12 DIAGNOSIS — I10 Essential (primary) hypertension: Secondary | ICD-10-CM | POA: Diagnosis not present

## 2021-01-12 DIAGNOSIS — J9601 Acute respiratory failure with hypoxia: Secondary | ICD-10-CM | POA: Diagnosis not present

## 2021-01-12 DIAGNOSIS — Z7189 Other specified counseling: Secondary | ICD-10-CM | POA: Diagnosis not present

## 2021-01-12 DIAGNOSIS — J80 Acute respiratory distress syndrome: Secondary | ICD-10-CM | POA: Diagnosis not present

## 2021-01-12 DIAGNOSIS — U071 COVID-19: Secondary | ICD-10-CM | POA: Diagnosis not present

## 2021-01-12 LAB — BASIC METABOLIC PANEL
Anion gap: 9 (ref 5–15)
BUN: 34 mg/dL — ABNORMAL HIGH (ref 8–23)
CO2: 32 mmol/L (ref 22–32)
Calcium: 7.6 mg/dL — ABNORMAL LOW (ref 8.9–10.3)
Chloride: 98 mmol/L (ref 98–111)
Creatinine, Ser: 0.82 mg/dL (ref 0.44–1.00)
GFR, Estimated: >60 mL/min (ref >60)
Glucose, Bld: 192 mg/dL — ABNORMAL HIGH (ref 70–99)
Potassium: 3.7 mmol/L (ref 3.5–5.1)
Sodium: 139 mmol/L (ref 135–145)

## 2021-01-12 LAB — C-REACTIVE PROTEIN: CRP: 1.9 mg/dL — ABNORMAL HIGH (ref <1.0)

## 2021-01-12 MED ORDER — SENNA 8.6 MG PO TABS
1.0000 | ORAL_TABLET | Freq: Every day | ORAL | Status: DC
  Administered 2021-01-12 – 2021-01-22 (×12): 8.6 mg via ORAL
  Filled 2021-01-12 (×12): qty 1

## 2021-01-12 NOTE — Progress Notes (Addendum)
Progress Note    Elizabeth Rowland  XBJ:[id number] DOB: 05/28/1941  DOA: 01/06/2021 PCP: Leanna Sato, MD      Brief Narrative:    Medical records reviewed and are as summarized below:  Elizabeth Rowland is a 80 y.o. female       Assessment/Plan:   Active Problems:   Severe sepsis with acute organ dysfunction (HCC)   Hyperlipidemia   Fibromyalgia   Essential hypertension   Acute respiratory failure with hypoxia (HCC)   Acute respiratory distress syndrome (ARDS) due to COVID-19 virus [facility])   Pulmonary emboli (HCC)   Community acquired pneumonia   Lung mass   Nutrition Problem: Inadequate oral intake Etiology: poor appetite  Signs/Symptoms: meal completion < 25%,per patient/family report   Body mass index is 35.16 kg/m.  (Morbid obesity)   Severe sepsis, acute hypoxemic respiratory failure, pneumonia, ARDS: Continue oxygen via HFNC (FiO2 50% at 40 L/min).  Continue antibiotics through 01/12/2021.  Completed 3-day course of high-dose steroids on 01/10/2021.    Bilateral pulmonary embolism, right peroneal DVT: Continue Eliquis  Elevated troponin: Secondary to demand ischemia  Hypokalemia: Improved.  Continue potassium repletion.  Right mid to lower lung mass-like opacity on chest x-ray: Not seen on CT chest.  Outpatient follow-up recommended.  Thyroid nodule: Outpatient follow-up recommended  Chronic pain/fibromyalgia: Continue tramadol     Diet Order            Diet regular Room service appropriate? Yes with Assist; Fluid consistency: Thin  Diet effective now                    Consultants:  Vascular surgeon  Pulmonologist  Procedures:  None    Medications:   . apixaban  10 mg Oral BID   Followed by  . [START ON 01/16/2021] apixaban  5 mg Oral BID  . feeding supplement  237 mL Oral TID BM  . furosemide  40 mg Intravenous Q12H  . gabapentin  300 mg Oral TID  . multivitamin with minerals  1 tablet Oral Daily  .  pravastatin  10 mg Oral Daily   Continuous Infusions: . sodium chloride 500 mL (01/12/21 1423)  . azithromycin 500 mg (01/12/21 1424)  . ceFEPime (MAXIPIME) IV 2 g (01/12/21 1321)     Anti-infectives (From admission, onward)   Start     Dose/Rate Route Frequency Ordered Stop   01/09/21 1230  ceFEPIme (MAXIPIME) 2 g in sodium chloride 0.9 % 100 mL IVPB        2 g 200 mL/hr over 30 Minutes Intravenous Every 8 hours 01/09/21 1201     01/07/21 1500  cefTRIAXone (ROCEPHIN) 2 g in sodium chloride 0.9 % 100 mL IVPB  Status:  Discontinued        2 g 200 mL/hr over 30 Minutes Intravenous Every 24 hours 01/06/21 1524 01/07/21 1207   01/07/21 1500  azithromycin (ZITHROMAX) 500 mg in sodium chloride 0.9 % 250 mL IVPB        500 mg 250 mL/hr over 60 Minutes Intravenous Every 24 hours 01/06/21 1524 01/13/21 1459   01/06/21 1515  cefTRIAXone (ROCEPHIN) 2 g in sodium chloride 0.9 % 100 mL IVPB        2 g 200 mL/hr over 30 Minutes Intravenous  Once 01/06/21 1506 01/06/21 1546   01/06/21 1515  azithromycin (ZITHROMAX) 500 mg in sodium chloride 0.9 % 250 mL IVPB        500 mg 250  mL/hr over 60 Minutes Intravenous  Once 01/06/21 1506 01/06/21 1645             Family Communication/Anticipated D/C date and plan/Code Status   DVT prophylaxis: Place TED hose Start: 01/06/21 1523 apixaban (ELIQUIS) tablet 10 mg  apixaban (ELIQUIS) tablet 5 mg     Code Status: Full Code  Family Communication: [name], son at the bedside Disposition Plan:    Status is: Inpatient  Remains inpatient appropriate because:Inpatient level of care appropriate due to severity of illness   Dispo: The patient is from: Home              Anticipated d/c is to: Home              Patient currently is not medically stable to d/c.   Difficult to place patient No           Subjective:   Interval events noted.  C/o sleepiness because she had taken tramadol for pain this morning.  Her husband and Shae, nurse  practitioner from palliative care were at the bedside.  Objective:    Vitals:   01/11/21 2035 01/11/21 2041 01/12/21 0451 01/12/21 0908  BP:  138/75 (!) 147/65 135/64  Pulse:  85 70 72  Resp:  20 20 14   Temp:  98.5 F (36.9 C) 98.1 F (36.7 C) 97.7 F (36.5 C)  TempSrc:  Oral Oral Oral  SpO2: 95% 94% 94% 94%  Weight:   98.8 kg   Height:       No data found.   Intake/Output Summary (Last 24 hours) at 01/12/2021 1429 Last data filed at 01/12/2021 1425 Gross per 24 hour  Intake 2101.02 ml  Output 3100 ml  Net -998.98 ml   Filed Weights   01/10/21 0523 01/11/21 0500 01/12/21 0451  Weight: 101.4 kg 98.4 kg 98.8 kg    Exam:  GEN: NAD SKIN: Chronic erythematous plaque-like lesions on bilateral legs EYES: No pallor or icterus ENT: MMM CV: RRR PULM: Basilar rales, no wheezing ABD: soft, ND, NT, +BS CNS: AAO x 3, non focal EXT: No edema or tenderness      Data Reviewed:   I have personally reviewed following labs and imaging studies:  Labs: Labs show the following:   Basic Metabolic Panel: Recent Labs  Lab 01/08/21 0706 01/09/21 0427 01/10/21 0530 01/11/21 0606 01/12/21 0518  NA 139 137 141 140 139  K 3.4* 3.0* 3.3* 3.5 3.7  CL 99 97* 97* 97* 98  CO2 28 30 32 33* 32  GLUCOSE 166* 171* 177* 191* 192*  BUN 22 30* 34* 35* 34*  CREATININE 0.80 0.66 0.80 0.78 0.82  CALCIUM 7.6* 7.6* 7.9* 7.7* 7.6*  MG 2.0 2.0  --   --   --   PHOS  --  3.9  --   --   --    GFR Estimated Creatinine Clearance: 63.8 mL/min (by C-G formula based on SCr of 0.82 mg/dL). Liver Function Tests: Recent Labs  Lab 01/07/21 0456  AST 34  ALT 17  ALKPHOS 52  BILITOT 2.7*  PROT 6.1*  ALBUMIN 2.4*   No results for input(s): LIPASE, AMYLASE in the last 168 hours. No results for input(s): AMMONIA in the last 168 hours. Coagulation profile Recent Labs  Lab 01/06/21 1644 01/07/21 0456  INR 1.6* 1.6*    CBC: Recent Labs  Lab 01/06/21 1305 01/07/21 0456 01/08/21 0706  01/09/21 0427 01/11/21 0606  WBC 17.3* 19.1* 12.9* 13.3* 11.8*  NEUTROABS 9.9*  --  10.3*  --  8.3*  HGB 15.6* 14.6 15.4* 14.9 15.1*  HCT 49.8* 47.3* 49.2* 47.1* 48.0*  MCV 95.0 95.7 95.5 94.0 94.5  PLT 123* 120* 118* 168 171   Cardiac Enzymes: No results for input(s): CKTOTAL, CKMB, CKMBINDEX, TROPONINI in the last 168 hours. BNP (last 3 results) No results for input(s): PROBNP in the last 8760 hours. CBG: No results for input(s): GLUCAP in the last 168 hours. D-Dimer: No results for input(s): DDIMER in the last 72 hours. Hgb A1c: No results for input(s): HGBA1C in the last 72 hours. Lipid Profile: No results for input(s): CHOL, HDL, LDLCALC, TRIG, CHOLHDL, LDLDIRECT in the last 72 hours. Thyroid function studies: No results for input(s): TSH, T4TOTAL, T3FREE, THYROIDAB in the last 72 hours.  Invalid input(s): FREET3 Anemia work up: No results for input(s): VITAMINB12, FOLATE, FERRITIN, TIBC, IRON, RETICCTPCT in the last 72 hours. Sepsis Labs: Recent Labs  Lab 01/06/21 1305 01/06/21 1741 01/06/21 2020 01/07/21 0456 01/08/21 0706 01/09/21 0427 01/10/21 0530 01/11/21 0606  PROCALCITON  --   --   --  0.10  --  <0.10 <0.10 <0.10  WBC 17.3*  --   --  19.1* 12.9* 13.3*  --  11.8*  LATICACIDVEN 1.9 2.2* 1.8  --   --   --   --   --     Microbiology Recent Results (from the past 240 hour(s))  Resp Panel by RT-PCR (Flu A&B, Covid) Nasopharyngeal Swab     Status: None   Collection Time: 01/06/21  1:05 PM   Specimen: Nasopharyngeal Swab; Nasopharyngeal(NP) swabs in vial transport medium  Result Value Ref Range Status   SARS Coronavirus 2 by RT PCR NEGATIVE NEGATIVE Final    Comment: (NOTE) SARS-CoV-2 target nucleic acids are NOT DETECTED.  The SARS-CoV-2 RNA is generally detectable in upper respiratory specimens during the acute phase of infection. The lowest concentration of SARS-CoV-2 viral copies this assay can detect is 138 copies/mL. A negative result does not  preclude SARS-Cov-2 infection and should not be used as the sole basis for treatment or other patient management decisions. A negative result may occur with  improper specimen collection/handling, submission of specimen other than nasopharyngeal swab, presence of viral mutation(s) within the areas targeted by this assay, and inadequate number of viral copies(<138 copies/mL). A negative result must be combined with clinical observations, patient history, and epidemiological information. The expected result is Negative.  Fact Sheet for Patients:  BloggerCourse.com  Fact Sheet for Healthcare Providers:  SeriousBroker.it  This test is no t yet approved or cleared by the [place] FDA and  has been authorized for detection and/or diagnosis of SARS-CoV-2 by FDA under an Emergency Use Authorization (EUA). This EUA will remain  in effect (meaning this test can be used) for the duration of the COVID-19 declaration under Section 564(b)(1) of the Act, 21 U.S.C.section 360bbb-3(b)(1), unless the authorization is terminated  or revoked sooner.       Influenza A by PCR NEGATIVE NEGATIVE Final   Influenza B by PCR NEGATIVE NEGATIVE Final    Comment: (NOTE) The Xpert Xpress SARS-CoV-2/FLU/RSV plus assay is intended as an aid in the diagnosis of influenza from Nasopharyngeal swab specimens and should not be used as a sole basis for treatment. Nasal washings and aspirates are unacceptable for Xpert Xpress SARS-CoV-2/FLU/RSV testing.  Fact Sheet for Patients: BloggerCourse.com  Fact Sheet for Healthcare Providers: SeriousBroker.it  This test is not yet approved or cleared by the [place] FDA and has been  authorized for detection and/or diagnosis of SARS-CoV-2 by FDA under an Emergency Use Authorization (EUA). This EUA will remain in effect (meaning this test can be used) for the  duration of the COVID-19 declaration under Section 564(b)(1) of the Act, 21 U.S.C. section 360bbb-3(b)(1), unless the authorization is terminated or revoked.  Performed at [facility], [address] [place], [place] [address]   Culture, blood (routine x 2)     Status: None (Preliminary result)   Collection Time: 01/06/21  3:00 PM   Specimen: BLOOD  Result Value Ref Range Status   Specimen Description BLOOD  LEFT AC  Final   Special Requests   Final    BOTTLES DRAWN AEROBIC AND ANAEROBIC Blood Culture results may not be optimal due to an inadequate volume of blood received in culture bottles   Culture   Final    NO GROWTH 4 DAYS Performed at [facility], [address] [place], [place] [address]    Report Status PENDING  Incomplete  Culture, blood (routine x 2)     Status: None (Preliminary result)   Collection Time: 01/06/21  3:00 PM   Specimen: BLOOD  Result Value Ref Range Status   Specimen Description BLOOD  RIGHT [facility]  Final   Special Requests   Final    BOTTLES DRAWN AEROBIC AND ANAEROBIC Blood Culture results may not be optimal due to an inadequate volume of blood received in culture bottles   Culture   Final    NO GROWTH 4 DAYS Performed at [facility], [address] [place], [place] [address]    Report Status PENDING  Incomplete  Respiratory (~20 pathogens) panel by PCR     Status: None   Collection Time: 01/07/21  8:03 AM   Specimen: Nasopharyngeal Swab; Respiratory  Result Value Ref Range Status   Adenovirus NOT DETECTED NOT DETECTED Final   Coronavirus 229E NOT DETECTED NOT DETECTED Final    Comment: (NOTE) The Coronavirus on the Respiratory Panel, DOES NOT test for the novel  Coronavirus (2019 nCoV)    Coronavirus HKU1 NOT DETECTED NOT DETECTED Final   Coronavirus NL63 NOT DETECTED NOT DETECTED Final   Coronavirus OC43 NOT DETECTED NOT DETECTED Final   Metapneumovirus NOT DETECTED NOT DETECTED Final   Rhinovirus /  Enterovirus NOT DETECTED NOT DETECTED Final   Influenza A NOT DETECTED NOT DETECTED Final   Influenza B NOT DETECTED NOT DETECTED Final   Parainfluenza Virus 1 NOT DETECTED NOT DETECTED Final   Parainfluenza Virus 2 NOT DETECTED NOT DETECTED Final   Parainfluenza Virus 3 NOT DETECTED NOT DETECTED Final   Parainfluenza Virus 4 NOT DETECTED NOT DETECTED Final   Respiratory Syncytial Virus NOT DETECTED NOT DETECTED Final   Bordetella pertussis NOT DETECTED NOT DETECTED Final   Bordetella Parapertussis NOT DETECTED NOT DETECTED Final   Chlamydophila pneumoniae NOT DETECTED NOT DETECTED Final   Mycoplasma pneumoniae NOT DETECTED NOT DETECTED Final    Comment: Performed at [facility], [address] [place], [place] [address]  MRSA PCR Screening     Status: None   Collection Time: 01/09/21 12:41 PM   Specimen: Nasopharyngeal  Result Value Ref Range Status   MRSA by PCR NEGATIVE NEGATIVE Final    Comment:        The GeneXpert MRSA Assay (FDA approved for NASAL specimens only), is one component of a comprehensive MRSA colonization surveillance program. It is not intended to diagnose MRSA infection nor to guide or monitor  treatment for MRSA infections. Performed at [facility], [address] [place], [place] [address]     Procedures and diagnostic studies:  No results found.             LOS: 5 days   Achilles Neville  [facility]   Pager on [email or web address]. If 7PM-7AM, please contact night-coverage at [email or web address]     01/12/2021, 2:29 PM

## 2021-01-12 NOTE — Care Management Important Message (Signed)
Important Message  Patient Details  Name: Elizabeth Rowland MRN: [id number] Date of Birth: [birth year]   Medicare Important Message Given:  Yes     [name] 01/12/2021, 12:35 PM

## 2021-01-12 NOTE — Progress Notes (Signed)
Daily Progress Note   Patient Name: Elizabeth Rowland       Date: 01/12/2021 DOB: 01-08-1941  Age: 80 y.o. MRN#: [id number] Attending Physician: Lurene Shadow, MD Primary Care Physician: Leanna Sato, MD Admit Date: 01/06/2021  Reason for Consultation/Follow-up: Establishing goals of care  Subjective: Patient sleeping, wakes to voice but falls asleep quickly. Son at bedside.   Length of Stay: 5  Current Medications: Scheduled Meds:  . apixaban  10 mg Oral BID   Followed by  . [START ON 01/16/2021] apixaban  5 mg Oral BID  . feeding supplement  237 mL Oral TID BM  . furosemide  40 mg Intravenous Q12H  . gabapentin  300 mg Oral TID  . multivitamin with minerals  1 tablet Oral Daily  . pravastatin  10 mg Oral Daily    Continuous Infusions: . sodium chloride 500 mL (01/11/21 1518)  . azithromycin 500 mg (01/11/21 1519)  . ceFEPime (MAXIPIME) IV 2 g (01/12/21 0458)    PRN Meds: sodium chloride, acetaminophen **OR** acetaminophen, hydrALAZINE, ondansetron **OR** ondansetron (ZOFRAN) IV, traMADol  Physical Exam Constitutional:      General: She is not in acute distress.    Comments: Drowsy following pain medication  Pulmonary:     Comments: Unlabored - remains on HFNC Skin:    General: Skin is warm and dry.             Vital Signs: BP 135/64 (BP Location: Left Arm)   Pulse 72   Temp 97.7 F (36.5 C) (Oral)   Resp 14   Ht 5\' 6"  (1.676 m)   Wt 98.8 kg   SpO2 94%   BMI 35.16 kg/m  SpO2: SpO2: 94 % O2 Device: O2 Device: High Flow Nasal Cannula O2 Flow Rate: O2 Flow Rate (L/min): 50 L/min  Intake/output summary:   Intake/Output Summary (Last 24 hours) at 01/12/2021 1222 Last data filed at 01/12/2021 1018 Gross per 24 hour  Intake 2019.21 ml  Output 3100 ml  Net -1080.79 ml    LBM: Last BM Date: 01/10/21 Baseline Weight: Weight: 100 kg Most recent weight: Weight: 98.8 kg       Palliative Assessment/Data: PPS 40%      Patient Active Problem List   Diagnosis Date Noted  . Community acquired pneumonia   . Lung mass   . Acute respiratory failure with hypoxia (HCC) 01/07/2021  . Acute respiratory distress syndrome (ARDS) due to COVID-19 virus (HCC) 01/07/2021  . Pulmonary emboli (HCC) 01/07/2021  . Severe sepsis with acute organ dysfunction (HCC) 01/06/2021  . Hyperlipidemia 01/06/2021  . Fibromyalgia 01/06/2021  . Essential hypertension 01/06/2021    Palliative Care Assessment & Plan   HPI: Elizabeth Parish Harbouris a 80 y.o.femalewith a past history of hyperlipidemia, GERD, hypertension, obesitywho comes the ED complaining of shortness of breath for the past [redacted] weeks along with loss of taste. Diagnosed with respiratory failure, pneumonia and ARDS. Remains dependent on HFNC - slowly weaning down. Also with bilateral PE. PMT consulted to discuss GOC.  Assessment: Follow up with patient - son 89 at bedside.   Received report from colleague Crystal - during previous GOC discussions patient had expressed interest in DNR status but  wanted to discuss further with her son.  Today, patient is sleeping - will wake to voice but goes back to sleep quickly. Son tells me she just recently received pain medicine.  Discussed with son hospital course. Son shares about patient's baseline - good functional status, completes ADLs independently, good appetite, no cognitive concerns. We discuss plan to continue to wean HFNC. We discuss likely need for SNF but unable to transfer to SNF as long as requiring HFNC - he expresses understanding.  I attempted to discuss code status with son - sharing with him patient's previously stated wishes. He tells me he is unsure about her wishes - not something they have discussed before. He is not comfortable with changing code status  today - he repeatedly shifts conversation to his concern about her toenails interfering with her ambulation. Unfortunately, patient not interactive enough to participate in conversation today.   Son tells me he needs to discuss this further with his brother and patient's ex husband.   Recommendations/Plan:  Full code/full scope  PMT to follow up next week  If d/c's to SNF, would recommend outpatient palliative referral  Care plan was discussed with patient's son, Dr. Myriam Forehand  Thank you for allowing the Palliative Medicine Team to assist in the care of this patient.   Total Time 20 minutes Prolonged Time Billed  no       Greater than 50%  of this time was spent counseling and coordinating care related to the above assessment and plan.  [name], DNP, [facility] Palliative Medicine Team Team Phone # [phone]  Pager [phone]

## 2021-01-12 NOTE — TOC Progression Note (Signed)
Transition of Care [facility]) - Progression Note    Patient Details  Name: Elizabeth Rowland MRN: [id number] Date of Birth: 1941-01-08  Transition of Care [facility]) CM/SW Contact  Hetty Ely, RN Phone Number: 01/12/2021, 2:30 PM  Clinical Narrative:  Spoke with patient's son Elizabeth Rowland at the bedside about SNF arrangements. Elizabeth Rowland states at this time he would rather speak with the Doctor then let me know.          Expected Discharge Plan and Services                                                 Social Determinants of Health (SDOH) Interventions    Readmission Risk Interventions No flowsheet data found.

## 2021-01-13 DIAGNOSIS — J80 Acute respiratory distress syndrome: Secondary | ICD-10-CM | POA: Diagnosis not present

## 2021-01-13 DIAGNOSIS — U071 COVID-19: Secondary | ICD-10-CM | POA: Diagnosis not present

## 2021-01-13 DIAGNOSIS — J189 Pneumonia, unspecified organism: Secondary | ICD-10-CM | POA: Diagnosis not present

## 2021-01-13 DIAGNOSIS — J9601 Acute respiratory failure with hypoxia: Secondary | ICD-10-CM | POA: Diagnosis not present

## 2021-01-13 LAB — CULTURE, BLOOD (ROUTINE X 2)
Culture: NO GROWTH
Culture: NO GROWTH

## 2021-01-13 LAB — BASIC METABOLIC PANEL
Anion gap: 13 (ref 5–15)
BUN: 34 mg/dL — ABNORMAL HIGH (ref 8–23)
CO2: 33 mmol/L — ABNORMAL HIGH (ref 22–32)
Calcium: 8 mg/dL — ABNORMAL LOW (ref 8.9–10.3)
Chloride: 92 mmol/L — ABNORMAL LOW (ref 98–111)
Creatinine, Ser: 0.8 mg/dL (ref 0.44–1.00)
GFR, Estimated: >60 mL/min (ref >60)
Glucose, Bld: 159 mg/dL — ABNORMAL HIGH (ref 70–99)
Potassium: 3.4 mmol/L — ABNORMAL LOW (ref 3.5–5.1)
Sodium: 138 mmol/L (ref 135–145)

## 2021-01-13 LAB — CBC
HCT: 53.1 % — ABNORMAL HIGH (ref 36.0–46.0)
Hemoglobin: 16.6 g/dL — ABNORMAL HIGH (ref 12.0–15.0)
MCH: 29.7 pg (ref 26.0–34.0)
MCHC: 31.3 g/dL (ref 30.0–36.0)
MCV: 95.2 fL (ref 80.0–100.0)
Platelets: 166 10*3/uL (ref 150–400)
RBC: 5.58 MIL/uL — ABNORMAL HIGH (ref 3.87–5.11)
RDW: 16.7 % — ABNORMAL HIGH (ref 11.5–15.5)
WBC: 22.1 10*3/uL — ABNORMAL HIGH (ref 4.0–10.5)
nRBC: 0 % (ref 0.0–0.2)

## 2021-01-13 LAB — MAGNESIUM: Magnesium: 2.2 mg/dL (ref 1.7–2.4)

## 2021-01-13 LAB — C-REACTIVE PROTEIN: CRP: 1.1 mg/dL — ABNORMAL HIGH (ref <1.0)

## 2021-01-13 LAB — PHOSPHORUS: Phosphorus: 3.4 mg/dL (ref 2.5–4.6)

## 2021-01-13 MED ORDER — POTASSIUM CHLORIDE CRYS ER 20 MEQ PO TBCR
40.0000 meq | EXTENDED_RELEASE_TABLET | Freq: Once | ORAL | Status: AC
  Administered 2021-01-13: 40 meq via ORAL
  Filled 2021-01-13: qty 2

## 2021-01-13 MED ORDER — FUROSEMIDE 20 MG PO TABS
20.0000 mg | ORAL_TABLET | Freq: Every day | ORAL | Status: DC
  Administered 2021-01-14: 20 mg via ORAL
  Filled 2021-01-13: qty 1

## 2021-01-13 NOTE — Progress Notes (Signed)
Progress Note    Elizabeth Rowland  ZSW:[id number] DOB: [birth year]  DOA: 01/06/2021 PCP: Leanna Sato, MD      Brief Narrative:    Medical records reviewed and are as summarized below:  Elizabeth Rowland is a 80 y.o. female       Assessment/Plan:   Active Problems:   Severe sepsis with acute organ dysfunction (HCC)   Hyperlipidemia   Fibromyalgia   Essential hypertension   Acute respiratory failure with hypoxia (HCC)   Acute respiratory distress syndrome (ARDS) due to COVID-19 virus [facility])   Pulmonary emboli (HCC)   Community acquired pneumonia   Lung mass   Nutrition Problem: Inadequate oral intake Etiology: poor appetite  Signs/Symptoms: meal completion < 25%,per patient/family report   Body mass index is 34.76 kg/m.  (Morbid obesity)   Severe sepsis, acute hypoxemic respiratory failure, pneumonia, ARDS: She remains on oxygen via high flow nasal cannula (FiO2 55% at 35 L/min)..  Discontinue IV antibiotics.  Completed 3-day course of high-dose steroids on 01/10/2021.  Discontinue IV Lasix and start oral Lasix.  Bilateral pulmonary embolism, right peroneal DVT: Continue Eliquis  Elevated troponin: Secondary to demand ischemia  Hypokalemia: Improved.  Continue potassium repletion.  Right mid to lower lung mass-like opacity on chest x-ray: Not seen on CT chest.  Outpatient follow-up recommended.  Thyroid nodule: Outpatient follow-up recommended  Chronic pain/fibromyalgia: Continue tramadol     Diet Order            Diet regular Room service appropriate? Yes with Assist; Fluid consistency: Thin  Diet effective now                    Consultants:  Vascular surgeon  Pulmonologist  Procedures:  None    Medications:   . apixaban  10 mg Oral BID   Followed by  . [START ON 01/16/2021] apixaban  5 mg Oral BID  . feeding supplement  237 mL Oral TID BM  . [START ON 01/14/2021] furosemide  20 mg Oral Daily  . gabapentin  300 mg  Oral TID  . multivitamin with minerals  1 tablet Oral Daily  . potassium chloride  40 mEq Oral Once  . pravastatin  10 mg Oral Daily  . senna  1 tablet Oral QHS   Continuous Infusions: . sodium chloride 250 mL (01/13/21 1302)     Anti-infectives (From admission, onward)   Start     Dose/Rate Route Frequency Ordered Stop   01/09/21 1230  ceFEPIme (MAXIPIME) 2 g in sodium chloride 0.9 % 100 mL IVPB  Status:  Discontinued        2 g 200 mL/hr over 30 Minutes Intravenous Every 8 hours 01/09/21 1201 01/13/21 1429   01/07/21 1500  cefTRIAXone (ROCEPHIN) 2 g in sodium chloride 0.9 % 100 mL IVPB  Status:  Discontinued        2 g 200 mL/hr over 30 Minutes Intravenous Every 24 hours 01/06/21 1524 01/07/21 1207   01/07/21 1500  azithromycin (ZITHROMAX) 500 mg in sodium chloride 0.9 % 250 mL IVPB        500 mg 250 mL/hr over 60 Minutes Intravenous Every 24 hours 01/06/21 1524 01/12/21 1524   01/06/21 1515  cefTRIAXone (ROCEPHIN) 2 g in sodium chloride 0.9 % 100 mL IVPB        2 g 200 mL/hr over 30 Minutes Intravenous  Once 01/06/21 1506 01/06/21 1546   01/06/21 1515  azithromycin (ZITHROMAX) 500 mg  in sodium chloride 0.9 % 250 mL IVPB        500 mg 250 mL/hr over 60 Minutes Intravenous  Once 01/06/21 1506 01/06/21 1645             Family Communication/Anticipated D/C date and plan/Code Status   DVT prophylaxis: Place TED hose Start: 01/06/21 1523 apixaban (ELIQUIS) tablet 10 mg  apixaban (ELIQUIS) tablet 5 mg     Code Status: Full Code  Family Communication: None Disposition Plan:    Status is: Inpatient  Remains inpatient appropriate because:Inpatient level of care appropriate due to severity of illness   Dispo: The patient is from: Home              Anticipated d/c is to: Home              Patient currently is not medically stable to d/c.   Difficult to place patient No           Subjective:   Interval events noted.  No shortness of breath or chest pain.   She feels okay.  Objective:    Vitals:   01/13/21 0607 01/13/21 0800 01/13/21 1210 01/13/21 1433  BP: 134/73 120/73 (!) 142/64   Pulse: 81 78 88   Resp: 20 20 16    Temp: 98.5 F (36.9 C) 98.5 F (36.9 C) 98.4 F (36.9 C)   TempSrc: Oral Oral Oral   SpO2: 96% 98% 97% 92%  Weight: 97.7 kg     Height:       No data found.   Intake/Output Summary (Last 24 hours) at 01/13/2021 1435 Last data filed at 01/13/2021 1304 Gross per 24 hour  Intake 1448.31 ml  Output 1900 ml  Net -451.69 ml   Filed Weights   01/11/21 0500 01/12/21 0451 01/13/21 0607  Weight: 98.4 kg 98.8 kg 97.7 kg    Exam:  GEN: NAD SKIN: Warm and dry EYES: No pallor or icterus ENT: MMM CV: RRR PULM: Bibasilar rales.  No wheezing heard. ABD: soft, obese, NT, +BS CNS: AAO x 3, non focal EXT: No edema or tenderness      Data Reviewed:   I have personally reviewed following labs and imaging studies:  Labs: Labs show the following:   Basic Metabolic Panel: Recent Labs  Lab 01/08/21 0706 01/09/21 0427 01/10/21 0530 01/11/21 0606 01/12/21 0518 01/13/21 0452  NA 139 137 141 140 139 138  K 3.4* 3.0* 3.3* 3.5 3.7 3.4*  CL 99 97* 97* 97* 98 92*  CO2 28 30 32 33* 32 33*  GLUCOSE 166* 171* 177* 191* 192* 159*  BUN 22 30* 34* 35* 34* 34*  CREATININE 0.80 0.66 0.80 0.78 0.82 0.80  CALCIUM 7.6* 7.6* 7.9* 7.7* 7.6* 8.0*  MG 2.0 2.0  --   --   --  2.2  PHOS  --  3.9  --   --   --  3.4   GFR Estimated Creatinine Clearance: 65 mL/min (by C-G formula based on SCr of 0.8 mg/dL). Liver Function Tests: Recent Labs  Lab 01/07/21 0456  AST 34  ALT 17  ALKPHOS 52  BILITOT 2.7*  PROT 6.1*  ALBUMIN 2.4*   No results for input(s): LIPASE, AMYLASE in the last 168 hours. No results for input(s): AMMONIA in the last 168 hours. Coagulation profile Recent Labs  Lab 01/06/21 1644 01/07/21 0456  INR 1.6* 1.6*    CBC: Recent Labs  Lab 01/07/21 0456 01/08/21 01/10/21 01/09/21 0427 01/11/21 0606  01/13/21 01/15/21  WBC 19.1* 12.9* 13.3* 11.8* 22.1*  NEUTROABS  --  10.3*  --  8.3*  --   HGB 14.6 15.4* 14.9 15.1* 16.6*  HCT 47.3* 49.2* 47.1* 48.0* 53.1*  MCV 95.7 95.5 94.0 94.5 95.2  PLT 120* 118* 168 171 166   Cardiac Enzymes: No results for input(s): CKTOTAL, CKMB, CKMBINDEX, TROPONINI in the last 168 hours. BNP (last 3 results) No results for input(s): PROBNP in the last 8760 hours. CBG: No results for input(s): GLUCAP in the last 168 hours. D-Dimer: No results for input(s): DDIMER in the last 72 hours. Hgb A1c: No results for input(s): HGBA1C in the last 72 hours. Lipid Profile: No results for input(s): CHOL, HDL, LDLCALC, TRIG, CHOLHDL, LDLDIRECT in the last 72 hours. Thyroid function studies: No results for input(s): TSH, T4TOTAL, T3FREE, THYROIDAB in the last 72 hours.  Invalid input(s): FREET3 Anemia work up: No results for input(s): VITAMINB12, FOLATE, FERRITIN, TIBC, IRON, RETICCTPCT in the last 72 hours. Sepsis Labs: Recent Labs  Lab 01/06/21 1741 01/06/21 2020 01/07/21 0456 01/07/21 0456 01/08/21 0706 01/09/21 0427 01/10/21 0530 01/11/21 0606 01/13/21 0452  PROCALCITON  --   --  0.10  --   --  <0.10 <0.10 <0.10  --   WBC  --   --  19.1*   < > 12.9* 13.3*  --  11.8* 22.1*  LATICACIDVEN 2.2* 1.8  --   --   --   --   --   --   --    < > = values in this interval not displayed.    Microbiology Recent Results (from the past 240 hour(s))  Resp Panel by RT-PCR (Flu A&B, Covid) Nasopharyngeal Swab     Status: None   Collection Time: 01/06/21  1:05 PM   Specimen: Nasopharyngeal Swab; Nasopharyngeal(NP) swabs in vial transport medium  Result Value Ref Range Status   SARS Coronavirus 2 by RT PCR NEGATIVE NEGATIVE Final    Comment: (NOTE) SARS-CoV-2 target nucleic acids are NOT DETECTED.  The SARS-CoV-2 RNA is generally detectable in upper respiratory specimens during the acute phase of infection. The lowest concentration of SARS-CoV-2 viral copies this  assay can detect is 138 copies/mL. A negative result does not preclude SARS-Cov-2 infection and should not be used as the sole basis for treatment or other patient management decisions. A negative result may occur with  improper specimen collection/handling, submission of specimen other than nasopharyngeal swab, presence of viral mutation(s) within the areas targeted by this assay, and inadequate number of viral copies(<138 copies/mL). A negative result must be combined with clinical observations, patient history, and epidemiological information. The expected result is Negative.  Fact Sheet for Patients:  BloggerCourse.com  Fact Sheet for Healthcare Providers:  SeriousBroker.it  This test is no t yet approved or cleared by the [place] FDA and  has been authorized for detection and/or diagnosis of SARS-CoV-2 by FDA under an Emergency Use Authorization (EUA). This EUA will remain  in effect (meaning this test can be used) for the duration of the COVID-19 declaration under Section 564(b)(1) of the Act, 21 U.S.C.section 360bbb-3(b)(1), unless the authorization is terminated  or revoked sooner.       Influenza A by PCR NEGATIVE NEGATIVE Final   Influenza B by PCR NEGATIVE NEGATIVE Final    Comment: (NOTE) The Xpert Xpress SARS-CoV-2/FLU/RSV plus assay is intended as an aid in the diagnosis of influenza from Nasopharyngeal swab specimens and should not be used as a sole basis for treatment. Nasal washings  and aspirates are unacceptable for Xpert Xpress SARS-CoV-2/FLU/RSV testing.  Fact Sheet for Patients: BloggerCourse.com  Fact Sheet for Healthcare Providers: SeriousBroker.it  This test is not yet approved or cleared by the [place] FDA and has been authorized for detection and/or diagnosis of SARS-CoV-2 by FDA under an Emergency Use Authorization (EUA). This EUA will  remain in effect (meaning this test can be used) for the duration of the COVID-19 declaration under Section 564(b)(1) of the Act, 21 U.S.C. section 360bbb-3(b)(1), unless the authorization is terminated or revoked.  Performed at [facility], [address] [place], [place] [address]   Culture, blood (routine x 2)     Status: None (Preliminary result)   Collection Time: 01/06/21  3:00 PM   Specimen: BLOOD  Result Value Ref Range Status   Specimen Description BLOOD  LEFT AC  Final   Special Requests   Final    BOTTLES DRAWN AEROBIC AND ANAEROBIC Blood Culture results may not be optimal due to an inadequate volume of blood received in culture bottles   Culture   Final    NO GROWTH 4 DAYS Performed at [facility], [address], [place], [place] [address]    Report Status PENDING  Incomplete  Culture, blood (routine x 2)     Status: None (Preliminary result)   Collection Time: 01/06/21  3:00 PM   Specimen: BLOOD  Result Value Ref Range Status   Specimen Description BLOOD  RIGHT [facility]  Final   Special Requests   Final    BOTTLES DRAWN AEROBIC AND ANAEROBIC Blood Culture results may not be optimal due to an inadequate volume of blood received in culture bottles   Culture   Final    NO GROWTH 4 DAYS Performed at [facility], [address] [place], [place] [address]    Report Status PENDING  Incomplete  Respiratory (~20 pathogens) panel by PCR     Status: None   Collection Time: 01/07/21  8:03 AM   Specimen: Nasopharyngeal Swab; Respiratory  Result Value Ref Range Status   Adenovirus NOT DETECTED NOT DETECTED Final   Coronavirus 229E NOT DETECTED NOT DETECTED Final    Comment: (NOTE) The Coronavirus on the Respiratory Panel, DOES NOT test for the novel  Coronavirus (2019 nCoV)    Coronavirus HKU1 NOT DETECTED NOT DETECTED Final   Coronavirus NL63 NOT DETECTED NOT DETECTED Final   Coronavirus OC43 NOT DETECTED NOT DETECTED Final   Metapneumovirus  NOT DETECTED NOT DETECTED Final   Rhinovirus / Enterovirus NOT DETECTED NOT DETECTED Final   Influenza A NOT DETECTED NOT DETECTED Final   Influenza B NOT DETECTED NOT DETECTED Final   Parainfluenza Virus 1 NOT DETECTED NOT DETECTED Final   Parainfluenza Virus 2 NOT DETECTED NOT DETECTED Final   Parainfluenza Virus 3 NOT DETECTED NOT DETECTED Final   Parainfluenza Virus 4 NOT DETECTED NOT DETECTED Final   Respiratory Syncytial Virus NOT DETECTED NOT DETECTED Final   Bordetella pertussis NOT DETECTED NOT DETECTED Final   Bordetella Parapertussis NOT DETECTED NOT DETECTED Final   Chlamydophila pneumoniae NOT DETECTED NOT DETECTED Final   Mycoplasma pneumoniae NOT DETECTED NOT DETECTED Final    Comment: Performed at [facility], [address] [place], [place] [address]  MRSA PCR Screening     Status: None   Collection Time: 01/09/21 12:41 PM   Specimen: Nasopharyngeal  Result Value Ref Range Status   MRSA by PCR NEGATIVE NEGATIVE Final    Comment:  The GeneXpert MRSA Assay (FDA approved for NASAL specimens only), is one component of a comprehensive MRSA colonization surveillance program. It is not intended to diagnose MRSA infection nor to guide or monitor treatment for MRSA infections. Performed at [facility], [address] [place], [place] [id number]     Procedures and diagnostic studies:  No results found.             LOS: 6 days   Jami Bogdanski  [facility]   Pager on [email or web address]. If 7PM-7AM, please contact night-coverage at [email or web address]     01/13/2021, 2:35 PM

## 2021-01-14 DIAGNOSIS — J9601 Acute respiratory failure with hypoxia: Secondary | ICD-10-CM | POA: Diagnosis not present

## 2021-01-14 DIAGNOSIS — J80 Acute respiratory distress syndrome: Secondary | ICD-10-CM | POA: Diagnosis not present

## 2021-01-14 DIAGNOSIS — U071 COVID-19: Secondary | ICD-10-CM | POA: Diagnosis not present

## 2021-01-14 DIAGNOSIS — J189 Pneumonia, unspecified organism: Secondary | ICD-10-CM | POA: Diagnosis not present

## 2021-01-14 LAB — BASIC METABOLIC PANEL
Anion gap: 10 (ref 5–15)
BUN: 31 mg/dL — ABNORMAL HIGH (ref 8–23)
CO2: 33 mmol/L — ABNORMAL HIGH (ref 22–32)
Calcium: 8 mg/dL — ABNORMAL LOW (ref 8.9–10.3)
Chloride: 96 mmol/L — ABNORMAL LOW (ref 98–111)
Creatinine, Ser: 0.65 mg/dL (ref 0.44–1.00)
GFR, Estimated: >60 mL/min (ref >60)
Glucose, Bld: 140 mg/dL — ABNORMAL HIGH (ref 70–99)
Potassium: 4.2 mmol/L (ref 3.5–5.1)
Sodium: 139 mmol/L (ref 135–145)

## 2021-01-14 LAB — PHOSPHORUS: Phosphorus: 3.3 mg/dL (ref 2.5–4.6)

## 2021-01-14 LAB — MAGNESIUM: Magnesium: 2.2 mg/dL (ref 1.7–2.4)

## 2021-01-14 LAB — CBC WITH DIFFERENTIAL/PLATELET
Abs Immature Granulocytes: 0.66 10*3/uL — ABNORMAL HIGH (ref 0.00–0.07)
Basophils Absolute: 0.1 10*3/uL (ref 0.0–0.1)
Basophils Relative: 0 %
Eosinophils Absolute: 0.2 10*3/uL (ref 0.0–0.5)
Eosinophils Relative: 1 %
HCT: 49.7 % — ABNORMAL HIGH (ref 36.0–46.0)
Hemoglobin: 15.7 g/dL — ABNORMAL HIGH (ref 12.0–15.0)
Immature Granulocytes: 3 %
Lymphocytes Relative: 8 %
Lymphs Abs: 1.9 10*3/uL (ref 0.7–4.0)
MCH: 29.7 pg (ref 26.0–34.0)
MCHC: 31.6 g/dL (ref 30.0–36.0)
MCV: 94 fL (ref 80.0–100.0)
Monocytes Absolute: 7.3 10*3/uL — ABNORMAL HIGH (ref 0.1–1.0)
Monocytes Relative: 33 %
Neutro Abs: 12.1 10*3/uL — ABNORMAL HIGH (ref 1.7–7.7)
Neutrophils Relative %: 55 %
Platelets: 145 10*3/uL — ABNORMAL LOW (ref 150–400)
RBC: 5.29 MIL/uL — ABNORMAL HIGH (ref 3.87–5.11)
RDW: 16.2 % — ABNORMAL HIGH (ref 11.5–15.5)
Smear Review: NORMAL
WBC: 22.1 10*3/uL — ABNORMAL HIGH (ref 4.0–10.5)
nRBC: 0 % (ref 0.0–0.2)

## 2021-01-14 LAB — C-REACTIVE PROTEIN: CRP: 1.3 mg/dL — ABNORMAL HIGH (ref <1.0)

## 2021-01-14 MED ORDER — FUROSEMIDE 10 MG/ML IJ SOLN
40.0000 mg | Freq: Every day | INTRAMUSCULAR | Status: DC
  Administered 2021-01-14 – 2021-01-18 (×5): 40 mg via INTRAVENOUS
  Filled 2021-01-14 (×5): qty 4

## 2021-01-14 MED ORDER — MAGNESIUM SULFATE 2 GM/50ML IV SOLN
2.0000 g | Freq: Once | INTRAVENOUS | Status: AC
  Administered 2021-01-14: 2 g via INTRAVENOUS
  Filled 2021-01-14: qty 50

## 2021-01-14 MED ORDER — POTASSIUM PHOSPHATES 15 MMOLE/5ML IV SOLN
30.0000 mmol | Freq: Once | INTRAVENOUS | Status: AC
  Administered 2021-01-14: 30 mmol via INTRAVENOUS
  Filled 2021-01-14: qty 10

## 2021-01-14 MED ORDER — METHYLPREDNISOLONE SODIUM SUCC 40 MG IJ SOLR
40.0000 mg | Freq: Every day | INTRAMUSCULAR | Status: DC
  Administered 2021-01-14 – 2021-01-16 (×3): 40 mg via INTRAVENOUS
  Filled 2021-01-14 (×3): qty 1

## 2021-01-14 NOTE — Progress Notes (Signed)
Name: Elizabeth Rowland MRN: [id number] DOB: [birth year]     CONSULTATION DATE: 01/06/2021  REFERRING MD :  Nelson Chimes  CHIEF COMPLAINT:  Shortness of breath    ASSESSMENT AND PLAN   RESPIRATORY:  Acute hypoxemic respiratory failure         Presumably post covid pneumonitis         Possible PJP pneumonia - will order fungitell       There is pulmonary edema - continue with lasix IV          PT OT for atelectasis        Will re-initiate solumedrol   CT with GGO and interstitial thickening, small PE noted on mediastinal cuts  PE not hemodynamic, not causing RV strain, given interstitial disease is not the cause of hypoxia  CV:     ECHO  1. Left ventricular ejection fraction, by estimation, is 55 to 60%. The  left ventricle has normal function. Left ventricular endocardial border  not optimally defined to evaluate regional wall motion. Left ventricular  diastolic parameters are consistent  with Grade I diastolic dysfunction (impaired relaxation).  2. RV systolic function and size are normal. PA pressure.  3. Left atrial size was mildly dilated.  4. Right atrial size was mildly dilated.  5. The mitral valve is normal in structure. No evidence of mitral valve  regurgitation. No evidence of mitral stenosis.  6. The aortic valve is normal in structure. Aortic valve regurgitation is  not visualized. No aortic stenosis is present.   EF preserved, no evidence for overt failure although BNP was 2 fold elevated  Trend and see now that the patient had been diuresed somewhat  ID As above  SUBJECTIVE   HPI: 80 yo WF presented to the ED via EMS for shortness of breath and field saturation in the 40s. She can provide no history at the moment, but her son is at the bedside and the original record is used for this description. The patient has been at home except to see her pain clinic. Her son reports only a few persons visit. He cannot recall any recent (within the month or  so) of cough or other prodromal symptoms. The patient was never vaccinated and does not suffer from diabetes, but she is obese. Her COVID screen is negative x2 and a full respiratory Biofire was also negative for a range of viral infections. Despite this a CT was noted to have significant GGO and interstitial infiltrates. Her BNP was two fold elevated, but the pattern does not seem of overt volume overload and an echo revealed preserved LV function. Her procalcitonin is low for pneumonia, however she did have a leukocytosis.   PAST MEDICAL HISTORY :  HTN Fibromyalgia HLD Chronic Pain  Prior to Admission medications   Medication Sig Start Date End Date Taking? Authorizing Provider  Acetaminophen 500 MG capsule Take 500 mg by mouth every 4 (four) hours as needed.   Yes [provider]  gabapentin (NEURONTIN) 300 MG capsule Take 1 capsule by mouth 3 (three) times daily. 11/24/20  Yes [provider]  pravastatin (PRAVACHOL) 10 MG tablet Take 10 mg by mouth daily. 03/26/14  Yes [provider]  traMADol (ULTRAM) 50 MG tablet Take 50 mg by mouth 3 (three) times daily as needed. 11/24/20  Yes [provider]   FAMILY HISTORY:  DM, HTN  SOCIAL HISTORY:  reports that she has never smoked. She has never used smokeless tobacco. She reports that she  does not drink alcohol and does not use drugs.  REVIEW OF SYSTEMS:   Unable to obtain due to shortness of breath and mental status  Estimated body mass index is 34.91 kg/m as calculated from the following:   Height as of this encounter: 5\' 6"  (1.676 m).   Weight as of this encounter: 98.1 kg.   OBJECTIVE    VITAL SIGNS: Temp:  [97.8 F (36.6 C)-99.4 F (37.4 C)] 98.6 F (37 C) (05/01 1541) Pulse Rate:  [82-92] 92 (05/01 1541) Resp:  [16-20] 20 (05/01 1541) BP: (127-146)/(63-84) 146/70 (05/01 1541) SpO2:  [92 %-96 %] 94 % (05/01 1541) FiO2 (%):  [42 %-53 %] 42 % (05/01 1518) Weight:  [98.1 kg] 98.1 kg (05/01  0515)   I/O last 3 completed shifts: In: 1582.1 [P.O.:960; I.V.:422.1; IV Piggyback:200] Out: 1600 [Urine:1600] No intake/output data recorded.   SpO2: 94 % O2 Flow Rate (L/min): 35 L/min FiO2 (%): 42 %   Physical Examination:  GENERAL:critically ill appearing, not in overt resp distress HEAD: Normocephalic, atraumatic.  EYES: Pupils equal, round, reactive to light.  No scleral icterus.  MOUTH: Moist mucosal membrane. NECK: Supple. No JVD.  PULMONARY: few rhonchi scattered, diminished breath sounds all fields CARDIOVASCULAR:RRR, no murmur  GASTROINTESTINAL: Soft, nontender, -distended.  Positive bowel sounds.  MUSCULOSKELETAL: trace edema.  NEUROLOGIC: opens eyes, tracks, non-communicating.  SKIN:intact,warm,dry  I personally reviewed lab work that was obtained in last 24 hrs. CXR and CT Independently reviewed, CT image slice appended to this note  MEDICATIONS: I have reviewed all medications and confirmed regimen as documented   CULTURES: Recent Results (from the past 240 hour(s))  Resp Panel by RT-PCR (Flu A&B, Covid) Nasopharyngeal Swab     Status: None   Collection Time: 01/06/21  1:05 PM   Specimen: Nasopharyngeal Swab; Nasopharyngeal(NP) swabs in vial transport medium  Result Value Ref Range Status   SARS Coronavirus 2 by RT PCR NEGATIVE NEGATIVE Final    Comment: (NOTE) SARS-CoV-2 target nucleic acids are NOT DETECTED.  The SARS-CoV-2 RNA is generally detectable in upper respiratory specimens during the acute phase of infection. The lowest concentration of SARS-CoV-2 viral copies this assay can detect is 138 copies/mL. A negative result does not preclude SARS-Cov-2 infection and should not be used as the sole basis for treatment or other patient management decisions. A negative result may occur with  improper specimen collection/handling, submission of specimen other than nasopharyngeal swab, presence of viral mutation(s) within the areas targeted by this  assay, and inadequate number of viral copies(<138 copies/mL). A negative result must be combined with clinical observations, patient history, and epidemiological information. The expected result is Negative.  Fact Sheet for Patients:  BloggerCourse.comhttps://www.fda.gov/media/152166/download  Fact Sheet for Healthcare Providers:  SeriousBroker.ithttps://www.fda.gov/media/152162/download  This test is no t yet approved or cleared by the [place] FDA and  has been authorized for detection and/or diagnosis of SARS-CoV-2 by FDA under an Emergency Use Authorization (EUA). This EUA will remain  in effect (meaning this test can be used) for the duration of the COVID-19 declaration under Section 564(b)(1) of the Act, 21 U.S.C.section 360bbb-3(b)(1), unless the authorization is terminated  or revoked sooner.       Influenza A by PCR NEGATIVE NEGATIVE Final   Influenza B by PCR NEGATIVE NEGATIVE Final    Comment: (NOTE) The Xpert Xpress SARS-CoV-2/FLU/RSV plus assay is intended as an aid in the diagnosis of influenza from Nasopharyngeal swab specimens and should not be used as a sole basis for treatment.  Nasal washings and aspirates are unacceptable for Xpert Xpress SARS-CoV-2/FLU/RSV testing.  Fact Sheet for Patients: BloggerCourse.com  Fact Sheet for Healthcare Providers: SeriousBroker.it  This test is not yet approved or cleared by the [place] FDA and has been authorized for detection and/or diagnosis of SARS-CoV-2 by FDA under an Emergency Use Authorization (EUA). This EUA will remain in effect (meaning this test can be used) for the duration of the COVID-19 declaration under Section 564(b)(1) of the Act, 21 U.S.C. section 360bbb-3(b)(1), unless the authorization is terminated or revoked.  Performed at [facility], [address] [place], [place] [address]   Culture, blood (routine x 2)     Status: None   Collection Time: 01/06/21   3:00 PM   Specimen: BLOOD  Result Value Ref Range Status   Specimen Description BLOOD  LEFT [facility]  Final   Special Requests   Final    BOTTLES DRAWN AEROBIC AND ANAEROBIC Blood Culture results may not be optimal due to an inadequate volume of blood received in culture bottles   Culture   Final    NO GROWTH 7 DAYS Performed at [facility], [address] [place], [place] [address]    Report Status 01/13/2021 FINAL  Final  Culture, blood (routine x 2)     Status: None   Collection Time: 01/06/21  3:00 PM   Specimen: BLOOD  Result Value Ref Range Status   Specimen Description BLOOD  RIGHT [facility]  Final   Special Requests   Final    BOTTLES DRAWN AEROBIC AND ANAEROBIC Blood Culture results may not be optimal due to an inadequate volume of blood received in culture bottles   Culture   Final    NO GROWTH 7 DAYS Performed at [facility], [address] [place], [place] [address]    Report Status 01/13/2021 FINAL  Final  Respiratory (~20 pathogens) panel by PCR     Status: None   Collection Time: 01/07/21  8:03 AM   Specimen: Nasopharyngeal Swab; Respiratory  Result Value Ref Range Status   Adenovirus NOT DETECTED NOT DETECTED Final   Coronavirus 229E NOT DETECTED NOT DETECTED Final    Comment: (NOTE) The Coronavirus on the Respiratory Panel, DOES NOT test for the novel  Coronavirus (2019 nCoV)    Coronavirus HKU1 NOT DETECTED NOT DETECTED Final   Coronavirus NL63 NOT DETECTED NOT DETECTED Final   Coronavirus OC43 NOT DETECTED NOT DETECTED Final   Metapneumovirus NOT DETECTED NOT DETECTED Final   Rhinovirus / Enterovirus NOT DETECTED NOT DETECTED Final   Influenza A NOT DETECTED NOT DETECTED Final   Influenza B NOT DETECTED NOT DETECTED Final   Parainfluenza Virus 1 NOT DETECTED NOT DETECTED Final   Parainfluenza Virus 2 NOT DETECTED NOT DETECTED Final   Parainfluenza Virus 3 NOT DETECTED NOT DETECTED Final   Parainfluenza Virus 4 NOT DETECTED NOT DETECTED Final    Respiratory Syncytial Virus NOT DETECTED NOT DETECTED Final   Bordetella pertussis NOT DETECTED NOT DETECTED Final   Bordetella Parapertussis NOT DETECTED NOT DETECTED Final   Chlamydophila pneumoniae NOT DETECTED NOT DETECTED Final   Mycoplasma pneumoniae NOT DETECTED NOT DETECTED Final    Comment: Performed at [facility], [address] [place], [place] [address]  MRSA PCR Screening     Status: None   Collection Time: 01/09/21 12:41 PM   Specimen: Nasopharyngeal  Result Value Ref Range Status   MRSA by PCR NEGATIVE NEGATIVE Final    Comment:  The GeneXpert MRSA Assay (FDA approved for NASAL specimens only), is one component of a comprehensive MRSA colonization surveillance program. It is not intended to diagnose MRSA infection nor to guide or monitor treatment for MRSA infections. Performed at [facility], [address] [place], [place] [address]           Vida Rigger, M.D.  Pulmonary & Critical Care Medicine  [facility]

## 2021-01-14 NOTE — Progress Notes (Addendum)
Physical Therapy Treatment Patient Details Name: Elizabeth Rowland MRN: [id number] DOB: January 02, 1941 Today's Date: 01/14/2021    History of Present Illness 80 y.o. female with medical history significant for fibromyalgia, history of hypertension, history of hyperlipidemia, currently on tramadol and gabapentin for chronic pain and fibromyalgia, no longer taking antihypertensives for pravastatin, presents to the emergency department for chief concerns of shortness of breath.  Found to have  acute respiratory failure, ARDS. CT angiogram showed multiple PE with extensive groundglass opacities as well as interstitial changes. COVID PCR negative    PT Comments    Pt ready for session.  Feeling better.  Remains on HFNC but decreased to 35%.  She is able to participate in ex as below.  Good effort to reposition in bed.  Fatigued with activity but sats remains 90-94% during session.  Anticipate pt needing +2 assist to attempt sitting EOB.  Encouraged pt to do HEP on her own during the day and she voiced understanding.   Follow Up Recommendations  SNF     Equipment Recommendations       Recommendations for Other Services       Precautions / Restrictions Precautions Precautions: Fall Precaution Comments: per nurse, keep Sp02 above 85%    Mobility  Bed Mobility Overal bed mobility: Needs Assistance Bed Mobility: Rolling Rolling: Min assist         General bed mobility comments: able to help reposition in bed with HOB lowered with good effort by pt using BUE/LE's with cues    Transfers                    Ambulation/Gait                 Stairs             Wheelchair Mobility    Modified Rankin (Stroke Patients Only)       Balance                                            Cognition Arousal/Alertness: Awake/alert Behavior During Therapy: WFL for tasks assessed/performed Overall Cognitive Status: Within Functional Limits for tasks  assessed                                        Exercises Other Exercises Other Exercises: BLE AROM 2 x 10 for supine ranges    General Comments        Pertinent Vitals/Pain Pain Assessment: No/denies pain    Home Living                      Prior Function            PT Goals (current goals can now be found in the care plan section) Progress towards PT goals: Progressing toward goals    Frequency    Min 2X/week      PT Plan Current plan remains appropriate    Co-evaluation              AM-PAC PT "6 Clicks" Mobility   Outcome Measure  Help needed turning from your back to your side while in a flat bed without using bedrails?: A Lot Help needed moving from lying on your back to sitting on the side  of a flat bed without using bedrails?: A Lot Help needed moving to and from a bed to a chair (including a wheelchair)?: Total Help needed standing up from a chair using your arms (e.g., wheelchair or bedside chair)?: Total Help needed to walk in hospital room?: Total Help needed climbing 3-5 steps with a railing? : Total 6 Click Score: 8    End of Session Equipment Utilized During Treatment: Oxygen Activity Tolerance: Patient tolerated treatment well;Patient limited by fatigue Patient left: in bed;with call bell/phone within reach;with bed alarm set Nurse Communication: Mobility status PT Visit Diagnosis: Muscle weakness (generalized) (M62.81);Unsteadiness on feet (R26.81);Difficulty in walking, not elsewhere classified (R26.2)     Time: 8295-6213 PT Time Calculation (min) (ACUTE ONLY): 13 min  Charges:  $Therapeutic Exercise: 8-22 mins                    Danielle Dess, PTA 01/14/21, 11:12 AM

## 2021-01-14 NOTE — Progress Notes (Addendum)
Progress Note    Elizabeth Rowland  HYI:[id number] DOB: 1941/03/25  DOA: 01/06/2021 PCP: Leanna Sato, MD      Brief Narrative:    Medical records reviewed and are as summarized below:  Elizabeth Rowland is a 80 y.o. female       Assessment/Plan:   Active Problems:   Severe sepsis with acute organ dysfunction (HCC)   Hyperlipidemia   Fibromyalgia   Essential hypertension   Acute respiratory failure with hypoxia (HCC)   Acute respiratory distress syndrome (ARDS) due to COVID-19 virus [facility])   Pulmonary emboli (HCC)   Community acquired pneumonia   Lung mass   Nutrition Problem: Inadequate oral intake Etiology: poor appetite  Signs/Symptoms: meal completion < 25%,per patient/family report   Body mass index is 34.91 kg/m.  (Morbid obesity)   Severe sepsis, acute hypoxemic respiratory failure, pneumonia, ARDS: Continue oxygen via high flow nasal cannula (she is FiO2 of 50% at 35 L/min).  Continue to taper down oxygen.  Completed 8 days of IV antibiotics on 01/13/2021.  Completed 3-day course of high-dose steroids on 01/10/2021.  Continue oral Lasix.  Leukocytosis: Probably reactive.  Monitor off of antibiotics.  Bilateral pulmonary embolism, right peroneal DVT: Continue Eliquis  Elevated troponin: Secondary to demand ischemia  Hypokalemia: Improved.  Continue potassium repletion.  Right mid to lower lung mass-like opacity on chest x-ray: Not seen on CT chest.  Outpatient follow-up recommended.  Thyroid nodule: Outpatient follow-up recommended  Chronic pain/fibromyalgia: Continue tramadol  PT recommends discharge to SNF.   Diet Order            Diet regular Room service appropriate? Yes with Assist; Fluid consistency: Thin  Diet effective now                    Consultants:  Vascular surgeon  Pulmonologist  Procedures:  None    Medications:   . apixaban  10 mg Oral BID   Followed by  . [START ON 01/16/2021] apixaban  5  mg Oral BID  . feeding supplement  237 mL Oral TID BM  . furosemide  20 mg Oral Daily  . gabapentin  300 mg Oral TID  . multivitamin with minerals  1 tablet Oral Daily  . pravastatin  10 mg Oral Daily  . senna  1 tablet Oral QHS   Continuous Infusions: . sodium chloride 250 mL (01/13/21 1302)     Anti-infectives (From admission, onward)   Start     Dose/Rate Route Frequency Ordered Stop   01/09/21 1230  ceFEPIme (MAXIPIME) 2 g in sodium chloride 0.9 % 100 mL IVPB  Status:  Discontinued        2 g 200 mL/hr over 30 Minutes Intravenous Every 8 hours 01/09/21 1201 01/13/21 1429   01/07/21 1500  cefTRIAXone (ROCEPHIN) 2 g in sodium chloride 0.9 % 100 mL IVPB  Status:  Discontinued        2 g 200 mL/hr over 30 Minutes Intravenous Every 24 hours 01/06/21 1524 01/07/21 1207   01/07/21 1500  azithromycin (ZITHROMAX) 500 mg in sodium chloride 0.9 % 250 mL IVPB        500 mg 250 mL/hr over 60 Minutes Intravenous Every 24 hours 01/06/21 1524 01/12/21 1524   01/06/21 1515  cefTRIAXone (ROCEPHIN) 2 g in sodium chloride 0.9 % 100 mL IVPB        2 g 200 mL/hr over 30 Minutes Intravenous  Once 01/06/21 1506 01/06/21 1546  01/06/21 1515  azithromycin (ZITHROMAX) 500 mg in sodium chloride 0.9 % 250 mL IVPB        500 mg 250 mL/hr over 60 Minutes Intravenous  Once 01/06/21 1506 01/06/21 1645             Family Communication/Anticipated D/C date and plan/Code Status   DVT prophylaxis: Place TED hose Start: 01/06/21 1523 apixaban (ELIQUIS) tablet 10 mg  apixaban (ELIQUIS) tablet 5 mg     Code Status: Full Code  Family Communication: [name], son, at the bedside Disposition Plan:    Status is: Inpatient  Remains inpatient appropriate because:Inpatient level of care appropriate due to severity of illness   Dispo: The patient is from: Home              Anticipated d/c is to: Home              Patient currently is not medically stable to d/c.   Difficult to place patient  No           Subjective:   Interval events noted.  She feels a little better today.  No shortness of breath or chest pain   Objective:    Vitals:   01/14/21 0138 01/14/21 0515 01/14/21 0815 01/14/21 1154  BP:  134/63 127/84 129/72  Pulse:  86 82 85  Resp:  18 16 20   Temp:  99.4 F (37.4 C) 97.8 F (36.6 C) 98 F (36.7 C)  TempSrc:  Oral    SpO2: 94% 95% 92% 96%  Weight:  98.1 kg    Height:       No data found.   Intake/Output Summary (Last 24 hours) at 01/14/2021 1213 Last data filed at 01/14/2021 0600 Gross per 24 hour  Intake 826.41 ml  Output 500 ml  Net 326.41 ml   Filed Weights   01/12/21 0451 01/13/21 0607 01/14/21 0515  Weight: 98.8 kg 97.7 kg 98.1 kg    Exam:  GEN: NAD SKIN: Warm and dry.  Chronic multiple erythematous papular lesions on bilateral legs EYES: No pallor or icterus ENT: MMM CV: RRR PULM: Bibasilar rales.  No wheezing heard ABD: soft, obese, NT, +BS CNS: AAO x 3, non focal EXT: No edema or tenderness      Data Reviewed:   I have personally reviewed following labs and imaging studies:  Labs: Labs show the following:   Basic Metabolic Panel: Recent Labs  Lab 01/08/21 0706 01/09/21 0427 01/10/21 0530 01/11/21 0606 01/12/21 0518 01/13/21 0452 01/14/21 0506  NA 139 137 141 140 139 138 139  K 3.4* 3.0* 3.3* 3.5 3.7 3.4* 4.2  CL 99 97* 97* 97* 98 92* 96*  CO2 28 30 32 33* 32 33* 33*  GLUCOSE 166* 171* 177* 191* 192* 159* 140*  BUN 22 30* 34* 35* 34* 34* 31*  CREATININE 0.80 0.66 0.80 0.78 0.82 0.80 0.65  CALCIUM 7.6* 7.6* 7.9* 7.7* 7.6* 8.0* 8.0*  MG 2.0 2.0  --   --   --  2.2 2.2  PHOS  --  3.9  --   --   --  3.4 3.3   GFR Estimated Creatinine Clearance: 65.1 mL/min (by C-G formula based on SCr of 0.65 mg/dL). Liver Function Tests: No results for input(s): AST, ALT, ALKPHOS, BILITOT, PROT, ALBUMIN in the last 168 hours. No results for input(s): LIPASE, AMYLASE in the last 168 hours. No results for input(s):  AMMONIA in the last 168 hours. Coagulation profile No results for input(s): INR, PROTIME  in the last 168 hours.  CBC: Recent Labs  Lab 01/08/21 0706 01/09/21 0427 01/11/21 0606 01/13/21 0452 01/14/21 0506  WBC 12.9* 13.3* 11.8* 22.1* 22.1*  NEUTROABS 10.3*  --  8.3*  --  12.1*  HGB 15.4* 14.9 15.1* 16.6* 15.7*  HCT 49.2* 47.1* 48.0* 53.1* 49.7*  MCV 95.5 94.0 94.5 95.2 94.0  PLT 118* 168 171 166 145*   Cardiac Enzymes: No results for input(s): CKTOTAL, CKMB, CKMBINDEX, TROPONINI in the last 168 hours. BNP (last 3 results) No results for input(s): PROBNP in the last 8760 hours. CBG: No results for input(s): GLUCAP in the last 168 hours. D-Dimer: No results for input(s): DDIMER in the last 72 hours. Hgb A1c: No results for input(s): HGBA1C in the last 72 hours. Lipid Profile: No results for input(s): CHOL, HDL, LDLCALC, TRIG, CHOLHDL, LDLDIRECT in the last 72 hours. Thyroid function studies: No results for input(s): TSH, T4TOTAL, T3FREE, THYROIDAB in the last 72 hours.  Invalid input(s): FREET3 Anemia work up: No results for input(s): VITAMINB12, FOLATE, FERRITIN, TIBC, IRON, RETICCTPCT in the last 72 hours. Sepsis Labs: Recent Labs  Lab 01/09/21 0427 01/10/21 0530 01/11/21 0606 01/13/21 0452 01/14/21 0506  PROCALCITON <0.10 <0.10 <0.10  --   --   WBC 13.3*  --  11.8* 22.1* 22.1*    Microbiology Recent Results (from the past 240 hour(s))  Resp Panel by RT-PCR (Flu A&B, Covid) Nasopharyngeal Swab     Status: None   Collection Time: 01/06/21  1:05 PM   Specimen: Nasopharyngeal Swab; Nasopharyngeal(NP) swabs in vial transport medium  Result Value Ref Range Status   SARS Coronavirus 2 by RT PCR NEGATIVE NEGATIVE Final    Comment: (NOTE) SARS-CoV-2 target nucleic acids are NOT DETECTED.  The SARS-CoV-2 RNA is generally detectable in upper respiratory specimens during the acute phase of infection. The lowest concentration of SARS-CoV-2 viral copies this assay can  detect is 138 copies/mL. A negative result does not preclude SARS-Cov-2 infection and should not be used as the sole basis for treatment or other patient management decisions. A negative result may occur with  improper specimen collection/handling, submission of specimen other than nasopharyngeal swab, presence of viral mutation(s) within the areas targeted by this assay, and inadequate number of viral copies(<138 copies/mL). A negative result must be combined with clinical observations, patient history, and epidemiological information. The expected result is Negative.  Fact Sheet for Patients:  BloggerCourse.com  Fact Sheet for Healthcare Providers:  SeriousBroker.it  This test is no t yet approved or cleared by the [place] FDA and  has been authorized for detection and/or diagnosis of SARS-CoV-2 by FDA under an Emergency Use Authorization (EUA). This EUA will remain  in effect (meaning this test can be used) for the duration of the COVID-19 declaration under Section 564(b)(1) of the Act, 21 U.S.C.section 360bbb-3(b)(1), unless the authorization is terminated  or revoked sooner.       Influenza A by PCR NEGATIVE NEGATIVE Final   Influenza B by PCR NEGATIVE NEGATIVE Final    Comment: (NOTE) The Xpert Xpress SARS-CoV-2/FLU/RSV plus assay is intended as an aid in the diagnosis of influenza from Nasopharyngeal swab specimens and should not be used as a sole basis for treatment. Nasal washings and aspirates are unacceptable for Xpert Xpress SARS-CoV-2/FLU/RSV testing.  Fact Sheet for Patients: BloggerCourse.com  Fact Sheet for Healthcare Providers: SeriousBroker.it  This test is not yet approved or cleared by the [place] FDA and has been authorized for detection and/or diagnosis of  SARS-CoV-2 by FDA under an Emergency Use Authorization (EUA). This EUA will remain in  effect (meaning this test can be used) for the duration of the COVID-19 declaration under Section 564(b)(1) of the Act, 21 U.S.C. section 360bbb-3(b)(1), unless the authorization is terminated or revoked.  Performed at [facility], [address] [place], [place] [address]   Culture, blood (routine x 2)     Status: None   Collection Time: 01/06/21  3:00 PM   Specimen: BLOOD  Result Value Ref Range Status   Specimen Description BLOOD  LEFT [facility]  Final   Special Requests   Final    BOTTLES DRAWN AEROBIC AND ANAEROBIC Blood Culture results may not be optimal due to an inadequate volume of blood received in culture bottles   Culture   Final    NO GROWTH 7 DAYS Performed at [facility], [address] [place], [place] [address]    Report Status 01/13/2021 FINAL  Final  Culture, blood (routine x 2)     Status: None   Collection Time: 01/06/21  3:00 PM   Specimen: BLOOD  Result Value Ref Range Status   Specimen Description BLOOD  RIGHT [facility]  Final   Special Requests   Final    BOTTLES DRAWN AEROBIC AND ANAEROBIC Blood Culture results may not be optimal due to an inadequate volume of blood received in culture bottles   Culture   Final    NO GROWTH 7 DAYS Performed at [facility], [address] [place], [place] [address]    Report Status 01/13/2021 FINAL  Final  Respiratory (~20 pathogens) panel by PCR     Status: None   Collection Time: 01/07/21  8:03 AM   Specimen: Nasopharyngeal Swab; Respiratory  Result Value Ref Range Status   Adenovirus NOT DETECTED NOT DETECTED Final   Coronavirus 229E NOT DETECTED NOT DETECTED Final    Comment: (NOTE) The Coronavirus on the Respiratory Panel, DOES NOT test for the novel  Coronavirus (2019 nCoV)    Coronavirus HKU1 NOT DETECTED NOT DETECTED Final   Coronavirus NL63 NOT DETECTED NOT DETECTED Final   Coronavirus OC43 NOT DETECTED NOT DETECTED Final   Metapneumovirus NOT DETECTED NOT DETECTED Final    Rhinovirus / Enterovirus NOT DETECTED NOT DETECTED Final   Influenza A NOT DETECTED NOT DETECTED Final   Influenza B NOT DETECTED NOT DETECTED Final   Parainfluenza Virus 1 NOT DETECTED NOT DETECTED Final   Parainfluenza Virus 2 NOT DETECTED NOT DETECTED Final   Parainfluenza Virus 3 NOT DETECTED NOT DETECTED Final   Parainfluenza Virus 4 NOT DETECTED NOT DETECTED Final   Respiratory Syncytial Virus NOT DETECTED NOT DETECTED Final   Bordetella pertussis NOT DETECTED NOT DETECTED Final   Bordetella Parapertussis NOT DETECTED NOT DETECTED Final   Chlamydophila pneumoniae NOT DETECTED NOT DETECTED Final   Mycoplasma pneumoniae NOT DETECTED NOT DETECTED Final    Comment: Performed at [facility], [address] [place], [place] [address]  MRSA PCR Screening     Status: None   Collection Time: 01/09/21 12:41 PM   Specimen: Nasopharyngeal  Result Value Ref Range Status   MRSA by PCR NEGATIVE NEGATIVE Final    Comment:        The GeneXpert MRSA Assay (FDA approved for NASAL specimens only), is one component of a comprehensive MRSA colonization surveillance program. It is not intended to diagnose MRSA infection nor to guide or monitor treatment for MRSA infections. Performed at [facility], [address] [place], [place] [id number]     Procedures and diagnostic studies:  No results found.             LOS: 7 days   [facility]  [facility]   Pager on [email or web address]. If 7PM-7AM, please contact night-coverage at [email or web address]     01/14/2021, 12:13 PM

## 2021-01-15 DIAGNOSIS — J189 Pneumonia, unspecified organism: Secondary | ICD-10-CM | POA: Diagnosis not present

## 2021-01-15 DIAGNOSIS — J80 Acute respiratory distress syndrome: Secondary | ICD-10-CM | POA: Diagnosis not present

## 2021-01-15 DIAGNOSIS — U071 COVID-19: Secondary | ICD-10-CM | POA: Diagnosis not present

## 2021-01-15 DIAGNOSIS — J9601 Acute respiratory failure with hypoxia: Secondary | ICD-10-CM | POA: Diagnosis not present

## 2021-01-15 LAB — CBC WITH DIFFERENTIAL/PLATELET
Abs Immature Granulocytes: 0.79 10*3/uL — ABNORMAL HIGH (ref 0.00–0.07)
Basophils Absolute: 0 10*3/uL (ref 0.0–0.1)
Basophils Relative: 0 %
Eosinophils Absolute: 0 10*3/uL (ref 0.0–0.5)
Eosinophils Relative: 0 %
HCT: 48.3 % — ABNORMAL HIGH (ref 36.0–46.0)
Hemoglobin: 15.2 g/dL — ABNORMAL HIGH (ref 12.0–15.0)
Immature Granulocytes: 4 %
Lymphocytes Relative: 7 %
Lymphs Abs: 1.4 10*3/uL (ref 0.7–4.0)
MCH: 29.2 pg (ref 26.0–34.0)
MCHC: 31.5 g/dL (ref 30.0–36.0)
MCV: 92.9 fL (ref 80.0–100.0)
Monocytes Absolute: 2.7 10*3/uL — ABNORMAL HIGH (ref 0.1–1.0)
Monocytes Relative: 13 %
Neutro Abs: 15.7 10*3/uL — ABNORMAL HIGH (ref 1.7–7.7)
Neutrophils Relative %: 76 %
Platelets: 141 10*3/uL — ABNORMAL LOW (ref 150–400)
RBC: 5.2 MIL/uL — ABNORMAL HIGH (ref 3.87–5.11)
RDW: 15.8 % — ABNORMAL HIGH (ref 11.5–15.5)
WBC: 20.6 10*3/uL — ABNORMAL HIGH (ref 4.0–10.5)
nRBC: 0 % (ref 0.0–0.2)

## 2021-01-15 MED ORDER — ENSURE ENLIVE PO LIQD: 237.0000 mL | Freq: Every day | ORAL | Status: DC

## 2021-01-15 NOTE — Progress Notes (Addendum)
Progress Note    Elizabeth Rowland  SWH:[id number] DOB: 1941-03-03  DOA: 01/06/2021 PCP: Leanna Sato, MD      Brief Narrative:    Medical records reviewed and are as summarized below:  Elizabeth Rowland is a 80 y.o. female       Assessment/Plan:   Active Problems:   Severe sepsis with acute organ dysfunction (HCC)   Hyperlipidemia   Fibromyalgia   Essential hypertension   Acute respiratory failure with hypoxia (HCC)   Acute respiratory distress syndrome (ARDS) due to COVID-19 virus [facility])   Pulmonary emboli (HCC)   Community acquired pneumonia   Lung mass   Nutrition Problem: Inadequate oral intake Etiology: poor appetite  Signs/Symptoms: meal completion < 25%,per patient/family report   Body mass index is 33.51 kg/m.  (Morbid obesity)   Severe sepsis, acute hypoxemic respiratory failure, pneumonia, ARDS: Slowly improving.  Continue oxygen via HFNC (down from FiO2 of 50% to 40% at 35 L/min).  Continue to taper down oxygen.  Completed 8 days of IV antibiotics on 01/13/2021.  Completed 3-day course of high-dose steroids on 01/10/2021.  Pulmonologist started IV Lasix and low-dose IV steroids on 01/14/2021.  Leukocytosis: Probably reactive.  WBC is trending down.  Continue to monitor.  Bilateral pulmonary embolism, right peroneal DVT: Continue Eliquis  Elevated troponin: Secondary to demand ischemia  Hypokalemia: Improved.  Continue potassium repletion.  Right mid to lower lung mass-like opacity on chest x-ray: Not seen on CT chest.  Outpatient follow-up recommended.  Thyroid nodule: Outpatient follow-up recommended  Chronic pain/fibromyalgia: Continue tramadol  PT recommends discharge to SNF but son does not want her to go to SNF.   Diet Order            Diet regular Room service appropriate? Yes with Assist; Fluid consistency: Thin  Diet effective now                    Consultants:  Vascular  surgeon  Pulmonologist  Procedures:  None    Medications:   . apixaban  10 mg Oral BID   Followed by  . [START ON 01/16/2021] apixaban  5 mg Oral BID  . [START ON 01/16/2021] feeding supplement  237 mL Oral QHS  . furosemide  40 mg Intravenous Daily  . gabapentin  300 mg Oral TID  . methylPREDNISolone (SOLU-MEDROL) injection  40 mg Intravenous Daily  . multivitamin with minerals  1 tablet Oral Daily  . pravastatin  10 mg Oral Daily  . senna  1 tablet Oral QHS   Continuous Infusions: . sodium chloride 250 mL (01/13/21 1302)     Anti-infectives (From admission, onward)   Start     Dose/Rate Route Frequency Ordered Stop   01/09/21 1230  ceFEPIme (MAXIPIME) 2 g in sodium chloride 0.9 % 100 mL IVPB  Status:  Discontinued        2 g 200 mL/hr over 30 Minutes Intravenous Every 8 hours 01/09/21 1201 01/13/21 1429   01/07/21 1500  cefTRIAXone (ROCEPHIN) 2 g in sodium chloride 0.9 % 100 mL IVPB  Status:  Discontinued        2 g 200 mL/hr over 30 Minutes Intravenous Every 24 hours 01/06/21 1524 01/07/21 1207   01/07/21 1500  azithromycin (ZITHROMAX) 500 mg in sodium chloride 0.9 % 250 mL IVPB        500 mg 250 mL/hr over 60 Minutes Intravenous Every 24 hours 01/06/21 1524 01/12/21 1524   01/06/21  1515  cefTRIAXone (ROCEPHIN) 2 g in sodium chloride 0.9 % 100 mL IVPB        2 g 200 mL/hr over 30 Minutes Intravenous  Once 01/06/21 1506 01/06/21 1546   01/06/21 1515  azithromycin (ZITHROMAX) 500 mg in sodium chloride 0.9 % 250 mL IVPB        500 mg 250 mL/hr over 60 Minutes Intravenous  Once 01/06/21 1506 01/06/21 1645             Family Communication/Anticipated D/C date and plan/Code Status   DVT prophylaxis: Place TED hose Start: 01/06/21 1523 apixaban (ELIQUIS) tablet 10 mg  apixaban (ELIQUIS) tablet 5 mg     Code Status: Full Code  Family Communication: [name], son, at the bedside Disposition Plan:    Status is: Inpatient  Remains inpatient appropriate  because:Inpatient level of care appropriate due to severity of illness   Dispo: The patient is from: Home              Anticipated d/c is to: Home              Patient currently is not medically stable to d/c.   Difficult to place patient No           Subjective:   No acute issues overnight.  She feels better today.  Her son, Elizabeth Rowland, was at the bedside.   Objective:    Vitals:   01/15/21 0756 01/15/21 1119 01/15/21 1348 01/15/21 1356  BP: (!) 142/66 (!) 150/82    Pulse: 72 91    Resp: 19 19    Temp: 98.1 F (36.7 C) 98.1 F (36.7 C)    TempSrc: Oral Oral    SpO2: 94% 94% 91%   Weight:    94.2 kg  Height:       No data found.   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 EYES: No pallor or icterus ENT: MMM CV: RRR PULM: CTA B ABD: soft, obese, NT, +BS CNS: AAO x 3, non focal EXT: No edema or tenderness       Data Reviewed:   I have personally reviewed following labs and imaging studies:  Labs: Labs show the following:   Basic Metabolic Panel: Recent Labs  Lab 01/09/21 0427 01/10/21 0530 01/11/21 0606 01/12/21 0518 01/13/21 0452 01/14/21 0506  NA 137 141 140 139 138 139  K 3.0* 3.3* 3.5 3.7 3.4* 4.2  CL 97* 97* 97* 98 92* 96*  CO2 30 32 33* 32 33* 33*  GLUCOSE 171* 177* 191* 192* 159* 140*  BUN 30* 34* 35* 34* 34* 31*  CREATININE 0.66 0.80 0.78 0.82 0.80 0.65  CALCIUM 7.6* 7.9* 7.7* 7.6* 8.0* 8.0*  MG 2.0  --   --   --  2.2 2.2  PHOS 3.9  --   --   --  3.4 3.3   GFR Estimated Creatinine Clearance: 63.8 mL/min (by C-G formula based on SCr of 0.65 mg/dL). Liver Function Tests: No results for input(s): AST, ALT, ALKPHOS, BILITOT, PROT,  ALBUMIN in the last 168 hours. No results for input(s): LIPASE, AMYLASE in the last 168 hours. No results for  input(s): AMMONIA in the last 168 hours. Coagulation profile No results for input(s): INR, PROTIME in the last 168 hours.  CBC: Recent Labs  Lab 01/09/21 0427 01/11/21 0606 01/13/21 0452 01/14/21 0506 01/15/21 0948  WBC 13.3* 11.8* 22.1* 22.1* 20.6*  NEUTROABS  --  8.3*  --  12.1* 15.7*  HGB 14.9 15.1* 16.6* 15.7* 15.2*  HCT 47.1* 48.0* 53.1* 49.7* 48.3*  MCV 94.0 94.5 95.2 94.0 92.9  PLT 168 171 166 145* 141*   Cardiac Enzymes: No results for input(s): CKTOTAL, CKMB, CKMBINDEX, TROPONINI in the last 168 hours. BNP (last 3 results) No results for input(s): PROBNP in the last 8760 hours. CBG: No results for input(s): GLUCAP in the last 168 hours. D-Dimer: No results for input(s): DDIMER in the last 72 hours. Hgb A1c: No results for input(s): HGBA1C in the last 72 hours. Lipid Profile: No results for input(s): CHOL, HDL, LDLCALC, TRIG, CHOLHDL, LDLDIRECT in the last 72 hours. Thyroid function studies: No results for input(s): TSH, T4TOTAL, T3FREE, THYROIDAB in the last 72 hours.  Invalid input(s): FREET3 Anemia work up: No results for input(s): VITAMINB12, FOLATE, FERRITIN, TIBC, IRON, RETICCTPCT in the last 72 hours. Sepsis Labs: Recent Labs  Lab 01/09/21 0427 01/10/21 0530 01/11/21 0606 01/13/21 0452 01/14/21 0506 01/15/21 0948  PROCALCITON <0.10 <0.10 <0.10  --   --   --   WBC 13.3*  --  11.8* 22.1* 22.1* 20.6*    Microbiology Recent Results (from the past 240 hour(s))  Resp Panel by RT-PCR (Flu A&B, Covid) Nasopharyngeal Swab     Status: None   Collection Time: 01/06/21  1:05 PM   Specimen: Nasopharyngeal Swab; Nasopharyngeal(NP) swabs in vial transport medium  Result Value Ref Range Status   SARS Coronavirus 2 by RT PCR NEGATIVE NEGATIVE Final    Comment: (NOTE) SARS-CoV-2 target nucleic acids are NOT DETECTED.  The SARS-CoV-2 RNA is generally  detectable in upper respiratory specimens during the acute phase of infection. The lowest concentration of SARS-CoV-2 viral copies this assay can detect is 138 copies/mL. A negative result does not preclude SARS-Cov-2 infection and should not be used as the sole basis for treatment or other patient management decisions. A negative result may occur with  improper specimen collection/handling, submission of specimen other than nasopharyngeal swab, presence of viral mutation(s) within the areas targeted by this assay, and inadequate number of viral copies(<138 copies/mL). A negative result must be combined with clinical observations, patient history, and epidemiological information. The expected result is Negative.  Fact Sheet for Patients:  BloggerCourse.com  Fact Sheet for Healthcare Providers:  SeriousBroker.it  This test is no t yet approved or cleared by the [place] FDA and  has been authorized for detection and/or diagnosis of SARS-CoV-2 by FDA under an Emergency Use Authorization (EUA). This EUA will remain  in effect (meaning this test can be used) for the duration of the COVID-19 declaration under Section 564(b)(1) of the Act, 21 U.S.C.section 360bbb-3(b)(1), unless the authorization is terminated  or revoked sooner.       Influenza A by PCR NEGATIVE NEGATIVE Final   Influenza B by PCR NEGATIVE NEGATIVE Final    Comment: (NOTE) The Xpert Xpress SARS-CoV-2/FLU/RSV plus assay is intended as an aid in the diagnosis of influenza from Nasopharyngeal swab specimens and should not be used as a sole basis for treatment. Nasal washings and aspirates are unacceptable for Xpert Xpress SARS-CoV-2/FLU/RSV testing.  Fact Sheet for Patients: BloggerCourse.com  Fact Sheet for Healthcare Providers: SeriousBroker.it  This  test is not yet approved or cleared by the [place]  and has been authorized for detection and/or diagnosis of SARS-CoV-2 by FDA under an Emergency Use Authorization (EUA). This EUA will remain in effect (meaning this test can be used) for the duration of the COVID-19 declaration under Section 564(b)(1) of the Act, 21 U.S.C. section 360bbb-3(b)(1), unless the authorization is terminated or revoked.  Performed at [facility], [address] [place], [place] [address]   Culture, blood (routine x 2)     Status: None   Collection Time: 01/06/21  3:00 PM   Specimen: BLOOD  Result Value Ref Range Status   Specimen Description BLOOD  LEFT [facility]  Final   Special Requests   Final    BOTTLES DRAWN AEROBIC AND ANAEROBIC Blood Culture results may not be optimal due to an inadequate volume of blood received in culture bottles   Culture   Final    NO GROWTH 7 DAYS Performed at [facility], [address] [place], [place] [address]    Report Status 01/13/2021 FINAL  Final  Culture, blood (routine x 2)     Status: None   Collection Time: 01/06/21  3:00 PM   Specimen: BLOOD  Result Value Ref Range Status   Specimen Description BLOOD  RIGHT [facility]  Final   Special Requests   Final    BOTTLES DRAWN AEROBIC AND ANAEROBIC Blood Culture results may not be optimal due to an inadequate volume of blood received in culture bottles   Culture   Final    NO GROWTH 7 DAYS Performed at [facility], [address] [place], [place] [address]    Report Status 01/13/2021 FINAL  Final  Respiratory (~20 pathogens) panel by PCR     Status: None   Collection Time: 01/07/21  8:03 AM   Specimen: Nasopharyngeal Swab; Respiratory  Result Value Ref Range Status   Adenovirus NOT DETECTED NOT DETECTED Final   Coronavirus 229E NOT DETECTED NOT DETECTED Final    Comment: (NOTE) The Coronavirus on the Respiratory Panel, DOES NOT test for the novel  Coronavirus (2019 nCoV)    Coronavirus HKU1 NOT DETECTED NOT DETECTED Final   Coronavirus NL63  NOT DETECTED NOT DETECTED Final   Coronavirus OC43 NOT DETECTED NOT DETECTED Final   Metapneumovirus NOT DETECTED NOT DETECTED Final   Rhinovirus / Enterovirus NOT DETECTED NOT DETECTED Final   Influenza A NOT DETECTED NOT DETECTED Final   Influenza B NOT DETECTED NOT DETECTED Final   Parainfluenza Virus 1 NOT DETECTED NOT DETECTED Final   Parainfluenza Virus 2 NOT DETECTED NOT DETECTED Final   Parainfluenza Virus 3 NOT DETECTED NOT DETECTED Final   Parainfluenza Virus 4 NOT DETECTED NOT DETECTED Final   Respiratory Syncytial Virus NOT DETECTED NOT DETECTED Final   Bordetella pertussis NOT DETECTED NOT DETECTED Final   Bordetella Parapertussis NOT DETECTED NOT DETECTED Final   Chlamydophila pneumoniae NOT DETECTED NOT DETECTED Final   Mycoplasma pneumoniae NOT DETECTED NOT DETECTED Final    Comment: Performed at [facility], [address] [place], [place] [address]  MRSA PCR Screening     Status: None   Collection Time: 01/09/21 12:41 PM   Specimen: Nasopharyngeal  Result Value Ref Range Status   MRSA by PCR NEGATIVE NEGATIVE Final    Comment:        The GeneXpert MRSA Assay (FDA approved for NASAL specimens only), is one component of a comprehensive MRSA colonization surveillance program.  It is not intended to diagnose MRSA infection nor to guide or monitor treatment for MRSA infections. Performed at [facility], [address] [place], [place] [address]     Procedures and diagnostic studies:  No results found.             LOS: 8 days   Rheana Casebolt  [facility]   Pager on [email or web address]. If 7PM-7AM, please contact night-coverage at [email or web address]     01/15/2021, 2:13 PM

## 2021-01-15 NOTE — Progress Notes (Signed)
Physical Therapy Treatment Patient Details Name: Elizabeth Rowland MRN: [id number] DOB: 02-Jan-1941 Today's Date: 01/15/2021    History of Present Illness 80 y.o. female with medical history significant for fibromyalgia, history of hypertension, history of hyperlipidemia, currently on tramadol and gabapentin for chronic pain and fibromyalgia, no longer taking antihypertensives for pravastatin, presents to the emergency department for chief concerns of shortness of breath.  Found to have  acute respiratory failure, ARDS. CT angiogram showed multiple PE with extensive groundglass opacities as well as interstitial changes. COVID PCR negative    PT Comments    Patient progressed to sitting up on edge of bed with assistance. Patient dangled for less than 5 minutes and was fatigued after activity. Mod A for bed mobility and patient able to maintain midline sitting balance with unilateral UE support and close stand by assistance. Sp02 does decrease from 94% at rest to 86% with seated level activity initially and increased quickly to 90% with cues for breathing techniques. Unable to progress to standing due to poor overall activity tolerance and fatigue with minimal activity.  Recommend to continue PT to maximize independence and facilitate return to prior level of function. SNF is still recommended at discharge for ongoing PT efforts.     Follow Up Recommendations  SNF     Equipment Recommendations  Other (comment) (to be determined at next level of care)    Recommendations for Other Services       Precautions / Restrictions Precautions Precautions: Fall Restrictions Weight Bearing Restrictions: No    Mobility  Bed Mobility Overal bed mobility: Needs Assistance Bed Mobility: Supine to Sit;Sit to Supine     Supine to sit: Mod assist Sit to supine: Mod assist   General bed mobility comments: assistance for LE and trunk support. verbal cues for task initiation and technique. head of bed  elevated and heavy use of bed rails for UE support. Sp02 decreased to 86% and increased to 90% quickly with cues for breathing techniques    Transfers                 General transfer comment: unable to progress to standing due to poor sitting tolerance, fatigue with activity  Ambulation/Gait                 Stairs             Wheelchair Mobility    Modified Rankin (Stroke Patients Only)       Balance Overall balance assessment: Needs assistance Sitting-balance support: Feet supported;Single extremity supported Sitting balance-Leahy Scale: Fair Sitting balance - Comments: assistance required to achieve midline position and patient able to maintain with close stand by assistance                                    Cognition Arousal/Alertness: Awake/alert Behavior During Therapy: [facility] for tasks assessed/performed Overall Cognitive Status: Within Functional Limits for tasks assessed                                        Exercises      General Comments        Pertinent Vitals/Pain Pain Assessment: No/denies pain    Home Living  Prior Function            PT Goals (current goals can now be found in the care plan section) Acute Rehab PT Goals Patient Stated Goal: to go home PT Goal Formulation: With patient Time For Goal Achievement: 01/25/21 Potential to Achieve Goals: Fair Progress towards PT goals: Progressing toward goals    Frequency    Min 2X/week      PT Plan Current plan remains appropriate    Co-evaluation              AM-PAC PT "6 Clicks" Mobility   Outcome Measure  Help needed turning from your back to your side while in a flat bed without using bedrails?: A Lot Help needed moving from lying on your back to sitting on the side of a flat bed without using bedrails?: A Lot Help needed moving to and from a bed to a chair (including a wheelchair)?: A Lot Help  needed standing up from a chair using your arms (e.g., wheelchair or bedside chair)?: Total Help needed to walk in hospital room?: Total Help needed climbing 3-5 steps with a railing? : Total 6 Click Score: 9    End of Session Equipment Utilized During Treatment: Oxygen (heated high flow nasal canula 35L/min) Activity Tolerance: Patient tolerated treatment well;Patient limited by fatigue Patient left: in bed;with call bell/phone within reach;with bed alarm set;with family/visitor present Nurse Communication: Mobility status PT Visit Diagnosis: Muscle weakness (generalized) (M62.81);Unsteadiness on feet (R26.81);Difficulty in walking, not elsewhere classified (R26.2)     Time: 9767-3419 PT Time Calculation (min) (ACUTE ONLY): 26 min  Charges:  $Therapeutic Activity: 23-37 mins                     Donna Bernard, PT, MPT    Ina Homes 01/15/2021, 1:13 PM

## 2021-01-15 NOTE — Progress Notes (Signed)
Nutrition Follow-up  DOCUMENTATION CODES:   Obesity unspecified  INTERVENTION:   -Continue liberalized diet of regular -Continue MVI with minerals daily -Decrease Ensure Enlive po to daily, each supplement provides 350 kcal and 20 grams of protein -Magic cup BID with meals, each supplement provides 290 kcal and 9 grams of protein  NUTRITION DIAGNOSIS:   Inadequate oral intake related to poor appetite as evidenced by meal completion < 25%,per patient/family report.  Ongoing  GOAL:   Patient will meet greater than or equal to 90% of their needs  Progressing   MONITOR:   PO intake,Supplement acceptance,Labs,Weight trends,Skin,I & O's  REASON FOR ASSESSMENT:   Rounds    ASSESSMENT:   Elizabeth Rowland is a 80 y.o. female with medical history significant for fibromyalgia, history of hypertension, history of hyperlipidemia, currently on tramadol and gabapentin for chronic pain and fibromyalgia, no longer taking antihypertensives for pravastatin, presents to the emergency department for chief concerns of shortness of breath.  Reviewed I/O's: -1.5 L x 24 hours and -8.3 L since admission  UOP: 1.5 L x 24 hours  Pt unavailable at time of visit. Attempted to speak with pt via call to hospital room phone, however, unable to reach.   Pt remains on HFNC.   Intake has improved since last visit. Noted meal completion 25-100%. Pt with minimal acceptance of Ensure supplements, often drinking only 1 per day, per MAR.   Reviewed wt hx; pt has experienced a 1.9% wt loss since admission, which is not significant for time frame (pt also -8.3 L since admission, which is likely contributing to wt loss).   Palliative care following for goals of care discussions; pt and family desire full scope care at this time. Plan to d/c to SNF once medically stable, however, unable to transfer to SNF on HFNC.   Medications reviewed and include lasix and solu-medrol.   Labs reviewed.   Diet Order:    Diet Order            Diet regular Room service appropriate? Yes with Assist; Fluid consistency: Thin  Diet effective now                 EDUCATION NEEDS:   Education needs have been addressed  Skin:  Skin Assessment: Reviewed RN Assessment  Last BM:  01/05/21  Height:   Ht Readings from Last 1 Encounters:  01/06/21 5\' 6"  (1.676 m)    Weight:   Wt Readings from Last 1 Encounters:  01/14/21 98.1 kg    Ideal Body Weight:  59.1 kg  BMI:  Body mass index is 34.91 kg/m.  Estimated Nutritional Needs:   Kcal:  1600-1800  Protein:  90-105 grams  Fluid:  > 1.6 L    03/16/21, RD, LDN, CDCES Registered Dietitian II Certified Diabetes Care and Education Specialist Please refer to [facility] for RD and/or RD on-call/weekend/after hours pager

## 2021-01-15 NOTE — Care Management Important Message (Signed)
Important Message  Patient Details  Name: Elizabeth Rowland MRN: [id number] Date of Birth: [birth year]   Medicare Important Message Given:  Yes     [name] 01/15/2021, 11:55 AM

## 2021-01-16 MED ORDER — PREDNISONE 50 MG PO TABS
50.0000 mg | ORAL_TABLET | Freq: Every day | ORAL | Status: DC
  Administered 2021-01-17: 50 mg via ORAL
  Filled 2021-01-16: qty 1

## 2021-01-16 MED ORDER — NYSTATIN 100000 UNIT/ML MT SUSP
5.0000 mL | Freq: Four times a day (QID) | OROMUCOSAL | Status: AC
  Administered 2021-01-16 – 2021-01-21 (×8): 500000 [IU] via ORAL
  Filled 2021-01-16 (×11): qty 5

## 2021-01-16 NOTE — Progress Notes (Addendum)
Progress Note    Elizabeth Michaelislizabeth A Demauro  ZOX:[id number] DOB: May 11, 1941  DOA: 01/06/2021 PCP: Leanna SatoMiles, Linda M, MD      Brief Narrative:    Medical records reviewed and are as summarized below:  Elizabeth Rowland is a 80 y.o. female with past medical history significant for hypertension, hyperlipidemia, fibromyalgia, who presented to the hospital because of shortness of breath.  She was hypoxic in the emergency room and was requiring 10 L/min oxygen.  She was admitted to the hospital for severe sepsis secondary to pneumonia, acute pulmonary embolism and acute hypoxic respiratory failure.  She also had ARDS and there was concern for COVID-19 infection.  However, tests for COVID-19 was negative.  She was treated with empiric IV antibiotics, IV heparin, IV steroids and IV Lasix.  Subsequently, she was switched to Eliquis for long-term anticoagulation.  She also required oxygen via high flow nasal cannula.  Pulmonologist was consulted to assist with management.  Her condition slowly improved and oxygen requirement was weaned down to oxygen via nasal cannula.  She has a thyroid nodule and outpatient follow-up is recommended for further evaluation.  She also had a right mid to lower lobe mass-like opacity on chest x-ray but this was not evident on CT chest.  Outpatient follow-up is recommended for this as well.  She was evaluated by PT and OT recommended discharge to SNF.  However, patient and her son decided against the idea of going to SNF.     Assessment/Plan:   Active Problems:   Severe sepsis with acute organ dysfunction (HCC)   Hyperlipidemia   Fibromyalgia   Essential hypertension   Acute respiratory failure with hypoxia (HCC)   Acute respiratory distress syndrome (ARDS) due to COVID-19 virus [facility](HCC)   Pulmonary emboli (HCC)   Community acquired pneumonia   Lung mass   Nutrition Problem: Inadequate oral intake Etiology: poor appetite  Signs/Symptoms: meal completion <  25%,per patient/family report   Body mass index is 34.27 kg/m.  (Obesity)   Severe sepsis, acute hypoxemic respiratory failure, pneumonia, ARDS: Improving.  She has been tapered off of high flow nasal cannula and she is tolerating 6 L/min oxygen via nasal cannula. Completed 8 days of IV antibiotics on 01/13/2021.  Completed 3-day course of high-dose steroids on 01/10/2021.  Pulmonologist started IV Lasix and low-dose IV steroids on 01/14/2021.  She may be able to be discharged on 4 L/min oxygen via nasal cannula.  Leukocytosis: Probably reactive or may be related to steroids.  Monitor CBC.  Bilateral pulmonary embolism, right peroneal DVT: Continue Eliquis  Oral thrush: Start nystatin swish and swallow.  Elevated troponin: Secondary to demand ischemia  Hypokalemia: Improved.  Continue potassium repletion.  Right mid to lower lung mass-like opacity on chest x-ray: Not seen on CT chest.  Outpatient follow-up recommended.  Thyroid nodule: Outpatient follow-up recommended  Chronic pain/fibromyalgia: Continue tramadol  PT recommends discharge to SNF but son does not want her to go to SNF.   Diet Order            Diet regular Room service appropriate? Yes with Assist; Fluid consistency: Thin  Diet effective now                    Consultants:  Vascular surgeon  Pulmonologist  Procedures:  None    Medications:   . apixaban  5 mg Oral BID  . feeding supplement  237 mL Oral QHS  . furosemide  40 mg Intravenous  Daily  . gabapentin  300 mg Oral TID  . methylPREDNISolone (SOLU-MEDROL) injection  40 mg Intravenous Daily  . multivitamin with minerals  1 tablet Oral Daily  . nystatin  5 mL Oral QID  . pravastatin  10 mg Oral Daily  . senna  1 tablet Oral QHS   Continuous Infusions: . sodium chloride 250 mL (01/13/21 1302)     Anti-infectives (From admission, onward)   Start     Dose/Rate Route Frequency Ordered Stop   01/09/21 1230  ceFEPIme (MAXIPIME) 2 g in  sodium chloride 0.9 % 100 mL IVPB  Status:  Discontinued        2 g 200 mL/hr over 30 Minutes Intravenous Every 8 hours 01/09/21 1201 01/13/21 1429   01/07/21 1500  cefTRIAXone (ROCEPHIN) 2 g in sodium chloride 0.9 % 100 mL IVPB  Status:  Discontinued        2 g 200 mL/hr over 30 Minutes Intravenous Every 24 hours 01/06/21 1524 01/07/21 1207   01/07/21 1500  azithromycin (ZITHROMAX) 500 mg in sodium chloride 0.9 % 250 mL IVPB        500 mg 250 mL/hr over 60 Minutes Intravenous Every 24 hours 01/06/21 1524 01/12/21 1524   01/06/21 1515  cefTRIAXone (ROCEPHIN) 2 g in sodium chloride 0.9 % 100 mL IVPB        2 g 200 mL/hr over 30 Minutes Intravenous  Once 01/06/21 1506 01/06/21 1546   01/06/21 1515  azithromycin (ZITHROMAX) 500 mg in sodium chloride 0.9 % 250 mL IVPB        500 mg 250 mL/hr over 60 Minutes Intravenous  Once 01/06/21 1506 01/06/21 1645             Family Communication/Anticipated D/C date and plan/Code Status   DVT prophylaxis: Place TED hose Start: 01/06/21 1523 apixaban (ELIQUIS) tablet 5 mg     Code Status: Full Code  Family Communication: [name], son, at the bedside Disposition Plan:    Status is: Inpatient  Remains inpatient appropriate because:Inpatient level of care appropriate due to severity of illness   Dispo: The patient is from: Home              Anticipated d/c is to: Home              Patient currently is not medically stable to d/c.   Difficult to place patient No           Subjective:   Interval events noted.  She is feeling better.  Her son was at the bedside.   Objective:    Vitals:   01/16/21 0501 01/16/21 0730 01/16/21 1112 01/16/21 1119  BP: 137/70 135/60 136/70   Pulse: 79 74 88   Resp: 18 16 17    Temp: 99.6 F (37.6 C) 98.9 F (37.2 C) 98.4 F (36.9 C)   TempSrc: Oral Oral Oral   SpO2: 97% 97% 96% 96%  Weight: 96.3 kg     Height:       No data found.   Intake/Output Summary (Last 24 hours) at 01/16/2021  1444 Last data filed at 01/16/2021 1354 Gross per 24 hour  Intake 840 ml  Output 1550 ml  Net -710 ml   Filed Weights   01/14/21 0515 01/15/21 1356 01/16/21 0501  Weight: 98.1 kg 94.2 kg 96.3 kg    Exam:  GEN: NAD SKIN: Chronic multiple erythematous maculopapular lesions on bilateral legs EYES: No pallor or icterus ENT: MMM, whitish spots noted under  the tongue and on the buccal mucosa CV: RRR PULM: CTA B ABD: soft, ND, NT, +BS CNS: AAO x 3, non focal EXT: No edema or tenderness      Data Reviewed:   I have personally reviewed following labs and imaging studies:  Labs: Labs show the following:   Basic Metabolic Panel: Recent Labs  Lab 01/10/21 0530 01/11/21 0606 01/12/21 0518 01/13/21 0452 01/14/21 0506  NA 141 140 139 138 139  K 3.3* 3.5 3.7 3.4* 4.2  CL 97* 97* 98 92* 96*  CO2 32 33* 32 33* 33*  GLUCOSE 177* 191* 192* 159* 140*  BUN 34* 35* 34* 34* 31*  CREATININE 0.80 0.78 0.82 0.80 0.65  CALCIUM 7.9* 7.7* 7.6* 8.0* 8.0*  MG  --   --   --  2.2 2.2  PHOS  --   --   --  3.4 3.3   GFR Estimated Creatinine Clearance: 64.5 mL/min (by C-G formula based on SCr of 0.65 mg/dL). Liver Function Tests: No results for input(s): AST, ALT, ALKPHOS, BILITOT, PROT, ALBUMIN in the last 168 hours. No results for input(s): LIPASE, AMYLASE in the last 168 hours. No results for input(s): AMMONIA in the last 168 hours. Coagulation profile No results for input(s): INR, PROTIME in the last 168 hours.  CBC: Recent Labs  Lab 01/11/21 0606 01/13/21 0452 01/14/21 0506 01/15/21 0948  WBC 11.8* 22.1* 22.1* 20.6*  NEUTROABS 8.3*  --  12.1* 15.7*  HGB 15.1* 16.6* 15.7* 15.2*  HCT 48.0* 53.1* 49.7* 48.3*  MCV 94.5 95.2 94.0 92.9  PLT 171 166 145* 141*   Cardiac Enzymes: No results for input(s): CKTOTAL, CKMB, CKMBINDEX, TROPONINI in the last 168 hours. BNP (last 3 results) No results for input(s): PROBNP in the last 8760 hours. CBG: No results for input(s): GLUCAP in  the last 168 hours. D-Dimer: No results for input(s): DDIMER in the last 72 hours. Hgb A1c: No results for input(s): HGBA1C in the last 72 hours. Lipid Profile: No results for input(s): CHOL, HDL, LDLCALC, TRIG, CHOLHDL, LDLDIRECT in the last 72 hours. Thyroid function studies: No results for input(s): TSH, T4TOTAL, T3FREE, THYROIDAB in the last 72 hours.  Invalid input(s): FREET3 Anemia work up: No results for input(s): VITAMINB12, FOLATE, FERRITIN, TIBC, IRON, RETICCTPCT in the last 72 hours. Sepsis Labs: Recent Labs  Lab 01/10/21 0530 01/11/21 0606 01/13/21 0452 01/14/21 0506 01/15/21 0948  PROCALCITON <0.10 <0.10  --   --   --   WBC  --  11.8* 22.1* 22.1* 20.6*    Microbiology Recent Results (from the past 240 hour(s))  Culture, blood (routine x 2)     Status: None   Collection Time: 01/06/21  3:00 PM   Specimen: BLOOD  Result Value Ref Range Status   Specimen Description BLOOD  LEFT AC  Final   Special Requests   Final    BOTTLES DRAWN AEROBIC AND ANAEROBIC Blood Culture results may not be optimal due to an inadequate volume of blood received in culture bottles   Culture   Final    NO GROWTH 7 DAYS Performed at [facility], [address] [place], [place] [address]    Report Status 01/13/2021 FINAL  Final  Culture, blood (routine x 2)     Status: None   Collection Time: 01/06/21  3:00 PM   Specimen: BLOOD  Result Value Ref Range Status   Specimen Description BLOOD  RIGHT [facility]  Final   Special Requests   Final  BOTTLES DRAWN AEROBIC AND ANAEROBIC Blood Culture results may not be optimal due to an inadequate volume of blood received in culture bottles   Culture   Final    NO GROWTH 7 DAYS Performed at [facility], [address] [place], [place] [address]    Report Status 01/13/2021 FINAL  Final  Respiratory (~20 pathogens) panel by PCR     Status: None   Collection Time: 01/07/21  8:03 AM   Specimen: Nasopharyngeal Swab; Respiratory   Result Value Ref Range Status   Adenovirus NOT DETECTED NOT DETECTED Final   Coronavirus 229E NOT DETECTED NOT DETECTED Final    Comment: (NOTE) The Coronavirus on the Respiratory Panel, DOES NOT test for the novel  Coronavirus (2019 nCoV)    Coronavirus HKU1 NOT DETECTED NOT DETECTED Final   Coronavirus NL63 NOT DETECTED NOT DETECTED Final   Coronavirus OC43 NOT DETECTED NOT DETECTED Final   Metapneumovirus NOT DETECTED NOT DETECTED Final   Rhinovirus / Enterovirus NOT DETECTED NOT DETECTED Final   Influenza A NOT DETECTED NOT DETECTED Final   Influenza B NOT DETECTED NOT DETECTED Final   Parainfluenza Virus 1 NOT DETECTED NOT DETECTED Final   Parainfluenza Virus 2 NOT DETECTED NOT DETECTED Final   Parainfluenza Virus 3 NOT DETECTED NOT DETECTED Final   Parainfluenza Virus 4 NOT DETECTED NOT DETECTED Final   Respiratory Syncytial Virus NOT DETECTED NOT DETECTED Final   Bordetella pertussis NOT DETECTED NOT DETECTED Final   Bordetella Parapertussis NOT DETECTED NOT DETECTED Final   Chlamydophila pneumoniae NOT DETECTED NOT DETECTED Final   Mycoplasma pneumoniae NOT DETECTED NOT DETECTED Final    Comment: Performed at [facility], [address] [place], [place] [address]  MRSA PCR Screening     Status: None   Collection Time: 01/09/21 12:41 PM   Specimen: Nasopharyngeal  Result Value Ref Range Status   MRSA by PCR NEGATIVE NEGATIVE Final    Comment:        The GeneXpert MRSA Assay (FDA approved for NASAL specimens only), is one component of a comprehensive MRSA colonization surveillance program. It is not intended to diagnose MRSA infection nor to guide or monitor treatment for MRSA infections. Performed at [facility], [address] [place], [place] [address]     Procedures and diagnostic studies:  No results found.             LOS: 9 days   Serinity Ware  [employer] loss adjuster on [email or web address]. If 7PM-7AM, please contact  night-coverage at [email or web address]     01/16/2021, 2:44 PM

## 2021-01-16 NOTE — Progress Notes (Signed)
Name: Elizabeth Rowland MRN: [id number] DOB: Jul 19, 1941     CONSULTATION DATE: 01/06/2021  REFERRING MD :  Nelson Chimes  CHIEF COMPLAINT:  Shortness of breath    ASSESSMENT AND PLAN   RESPIRATORY:  Acute hypoxemic respiratory failure         Presumably post covid pneumonitis-changed solumedrol to pred 50 - 01/16/21         Unlikely PJP pneumonia -pending fungitell       There is pulmonary edema - continue with lasix IV- 40 lasix daily           PT OT for atelectasis-patient is improved continue please - 01/16/21        CT with GGO and interstitial thickening, small PE noted on mediastinal cuts  PE not hemodynamic, not causing RV strain, given interstitial disease is not the cause of hypoxia  CV:     ECHO  1. Left ventricular ejection fraction, by estimation, is 55 to 60%. The  left ventricle has normal function. Left ventricular endocardial border  not optimally defined to evaluate regional wall motion. Left ventricular  diastolic parameters are consistent  with Grade I diastolic dysfunction (impaired relaxation).  2. RV systolic function and size are normal. PA pressure.  3. Left atrial size was mildly dilated.  4. Right atrial size was mildly dilated.  5. The mitral valve is normal in structure. No evidence of mitral valve  regurgitation. No evidence of mitral stenosis.  6. The aortic valve is normal in structure. Aortic valve regurgitation is  not visualized. No aortic stenosis is present.   EF preserved, no evidence for overt failure although BNP was 2 fold elevated  Trend and see now that the patient had been diuresed somewhat  ID As above  SUBJECTIVE   HPI: 80 yo WF presented to the ED via EMS for shortness of breath and field saturation in the 40s. She can provide no history at the moment, but her son is at the bedside and the original record is used for this description. The patient has been at home except to see her pain clinic. Her son reports only a few  persons visit. He cannot recall any recent (within the month or so) of cough or other prodromal symptoms. The patient was never vaccinated and does not suffer from diabetes, but she is obese. Her COVID screen is negative x2 and a full respiratory Biofire was also negative for a range of viral infections. Despite this a CT was noted to have significant GGO and interstitial infiltrates. Her BNP was two fold elevated, but the pattern does not seem of overt volume overload and an echo revealed preserved LV function. Her procalcitonin is low for pneumonia, however she did have a leukocytosis.   PAST MEDICAL HISTORY :  HTN Fibromyalgia HLD Chronic Pain  Prior to Admission medications   Medication Sig Start Date End Date Taking? Authorizing Provider  Acetaminophen 500 MG capsule Take 500 mg by mouth every 4 (four) hours as needed.   Yes [provider]  gabapentin (NEURONTIN) 300 MG capsule Take 1 capsule by mouth 3 (three) times daily. 11/24/20  Yes [provider]  pravastatin (PRAVACHOL) 10 MG tablet Take 10 mg by mouth daily. 03/26/14  Yes [provider]  traMADol (ULTRAM) 50 MG tablet Take 50 mg by mouth 3 (three) times daily as needed. 11/24/20  Yes [provider]   FAMILY HISTORY:  DM, HTN  SOCIAL HISTORY:  reports that she has never smoked. She  has never used smokeless tobacco. She reports that she does not drink alcohol and does not use drugs.  REVIEW OF SYSTEMS:   Unable to obtain due to shortness of breath and mental status  Estimated body mass index is 34.27 kg/m as calculated from the following:   Height as of this encounter: 5\' 6"  (1.676 m).   Weight as of this encounter: 96.3 kg.   OBJECTIVE    VITAL SIGNS: Temp:  [98 F (36.7 C)-99.6 F (37.6 C)] 98.5 F (36.9 C) (05/03 1550) Pulse Rate:  [74-96] 96 (05/03 1550) Resp:  [16-20] 18 (05/03 1550) BP: (129-150)/(60-93) 129/93 (05/03 1550) SpO2:  [94 %-98 %] 96 % (05/03 1550) Weight:   [96.3 kg] 96.3 kg (05/03 0501)   I/O last 3 completed shifts: In: 720 [P.O.:720] Out: 1450 [Urine:1450] Total I/O In: 600 [P.O.:600] Out: 1550 [Urine:1550]   SpO2: 96 % O2 Flow Rate (L/min): 6 L/min FiO2 (%): 39.5 %   Physical Examination:  GENERAL:critically ill appearing, not in overt resp distress HEAD: Normocephalic, atraumatic.  EYES: Pupils equal, round, reactive to light.  No scleral icterus.  MOUTH: Moist mucosal membrane. NECK: Supple. No JVD.  PULMONARY: rhonchi CARDIOVASCULAR:RRR, no murmur  GASTROINTESTINAL: Soft, nontender, -distended.  Positive bowel sounds.  MUSCULOSKELETAL: trace edema.  NEUROLOGIC: opens eyes, tracks, non-communicating.  SKIN:intact,warm,dry  I personally reviewed lab work that was obtained in last 24 hrs. CXR and CT Independently reviewed, CT image slice appended to this note  MEDICATIONS: I have reviewed all medications and confirmed regimen as documented   CULTURES: Recent Results (from the past 240 hour(s))  Respiratory (~20 pathogens) panel by PCR     Status: None   Collection Time: 01/07/21  8:03 AM   Specimen: Nasopharyngeal Swab; Respiratory  Result Value Ref Range Status   Adenovirus NOT DETECTED NOT DETECTED Final   Coronavirus 229E NOT DETECTED NOT DETECTED Final    Comment: (NOTE) The Coronavirus on the Respiratory Panel, DOES NOT test for the novel  Coronavirus (2019 nCoV)    Coronavirus HKU1 NOT DETECTED NOT DETECTED Final   Coronavirus NL63 NOT DETECTED NOT DETECTED Final   Coronavirus OC43 NOT DETECTED NOT DETECTED Final   Metapneumovirus NOT DETECTED NOT DETECTED Final   Rhinovirus / Enterovirus NOT DETECTED NOT DETECTED Final   Influenza A NOT DETECTED NOT DETECTED Final   Influenza B NOT DETECTED NOT DETECTED Final   Parainfluenza Virus 1 NOT DETECTED NOT DETECTED Final   Parainfluenza Virus 2 NOT DETECTED NOT DETECTED Final   Parainfluenza Virus 3 NOT DETECTED NOT DETECTED Final   Parainfluenza Virus 4 NOT  DETECTED NOT DETECTED Final   Respiratory Syncytial Virus NOT DETECTED NOT DETECTED Final   Bordetella pertussis NOT DETECTED NOT DETECTED Final   Bordetella Parapertussis NOT DETECTED NOT DETECTED Final   Chlamydophila pneumoniae NOT DETECTED NOT DETECTED Final   Mycoplasma pneumoniae NOT DETECTED NOT DETECTED Final    Comment: Performed at [facility], [address], [place], [place]  MRSA PCR Screening     Status: None   Collection Time: 01/09/21 12:41 PM   Specimen: Nasopharyngeal  Result Value Ref Range Status   MRSA by PCR NEGATIVE NEGATIVE Final    Comment:        The GeneXpert MRSA Assay (FDA approved for NASAL specimens only), is one component of a comprehensive MRSA colonization surveillance program. It is not intended to diagnose MRSA infection nor to guide or monitor treatment for MRSA infections. Performed at [facility], [address], [place], [place] [address]           Vida Rigger, M.D.  Pulmonary & Critical Care Medicine  [facility]

## 2021-01-16 NOTE — Progress Notes (Signed)
Physical Therapy Treatment Patient Details Name: Elizabeth Rowland MRN: [id number] DOB: July 06, 1941 Today's Date: 01/16/2021    History of Present Illness 80 y.o. female with medical history significant for fibromyalgia, history of hypertension, history of hyperlipidemia, currently on tramadol and gabapentin for chronic pain and fibromyalgia, no longer taking antihypertensives for pravastatin, presents to the emergency department for chief concerns of shortness of breath.  Found to have  acute respiratory failure, ARDS. CT angiogram showed multiple PE with extensive groundglass opacities as well as interstitial changes. COVID PCR negative    PT Comments    Patient received in bed, she is agreeable to PT session. Patient requires mod assist for supine><sit. Unable to get fully midline in sitting and leaning heavily on R UE. Patient performed supine LE strengthening exercises. Patient is limited by weakness and fatigue. She will continue to benefit from skilled PT while here to improve strength and functional mobility/independence.       Follow Up Recommendations  SNF;Supervision for mobility/OOB     Equipment Recommendations  Other (comment) (TBD)    Recommendations for Other Services       Precautions / Restrictions Precautions Precautions: Fall Restrictions Weight Bearing Restrictions: No RLE Weight Bearing: Weight bearing as tolerated LLE Weight Bearing: Weight bearing as tolerated    Mobility  Bed Mobility Overal bed mobility: Needs Assistance Bed Mobility: Supine to Sit;Sit to Supine Rolling: Mod assist   Supine to sit: Mod assist     General bed mobility comments: assistance for LE and trunk support. verbal cues for task initiation and technique. head of bed elevated and heavy use of bed rails for UE support. Sp02 decreased to 89% and increased to 90's% quickly with cues for breathing techniques. Requires UE support in sitting, unable to sit without    Transfers                  General transfer comment: unable to progress to standing due to poor sitting tolerance/balance, fatigue with activity  Ambulation/Gait                 Stairs             Wheelchair Mobility    Modified Rankin (Stroke Patients Only)       Balance Overall balance assessment: Needs assistance Sitting-balance support: Single extremity supported Sitting balance-Leahy Scale: Fair Sitting balance - Comments: patient unable to sit without single UE support on bed. Leaning heavily on right UE.                                    Cognition Arousal/Alertness: Awake/alert Behavior During Therapy: Flat affect Overall Cognitive Status: Within Functional Limits for tasks assessed                                        Exercises Other Exercises Other Exercises: B LE exercises: ap, heel slides, hip abd/add, SLR x 10 reps each    General Comments        Pertinent Vitals/Pain Pain Assessment: Faces Faces Pain Scale: Hurts a little bit Pain Location: Ribs Pain Descriptors / Indicators: Discomfort Pain Intervention(s): Monitored during session    Home Living                      Prior Function  PT Goals (current goals can now be found in the care plan section) Acute Rehab PT Goals Patient Stated Goal: to go home PT Goal Formulation: With patient Time For Goal Achievement: 01/25/21 Potential to Achieve Goals: Fair Progress towards PT goals: Progressing toward goals    Frequency    Min 2X/week      PT Plan Current plan remains appropriate    Co-evaluation              AM-PAC PT "6 Clicks" Mobility   Outcome Measure  Help needed turning from your back to your side while in a flat bed without using bedrails?: A Lot Help needed moving from lying on your back to sitting on the side of a flat bed without using bedrails?: A Lot Help needed moving to and from a bed to a chair (including  a wheelchair)?: Total Help needed standing up from a chair using your arms (e.g., wheelchair or bedside chair)?: Total Help needed to walk in hospital room?: Total Help needed climbing 3-5 steps with a railing? : Total 6 Click Score: 8    End of Session Equipment Utilized During Treatment: Oxygen Activity Tolerance: Patient limited by fatigue Patient left: in bed;with call bell/phone within reach;with bed alarm set Nurse Communication: Mobility status PT Visit Diagnosis: Muscle weakness (generalized) (M62.81);Unsteadiness on feet (R26.81);Difficulty in walking, not elsewhere classified (R26.2)     Time: 8756-4332 PT Time Calculation (min) (ACUTE ONLY): 25 min  Charges:  $Therapeutic Exercise: 8-22 mins $Therapeutic Activity: 8-22 mins                     Amaro Mangold, PT, GCS 01/16/21,11:29 AM

## 2021-01-16 NOTE — Progress Notes (Signed)
Daily Progress Note   Patient Name: Elizabeth Rowland       Date: 01/16/2021 DOB: 10-08-1940  Age: 80 y.o. MRN#: [id number] Attending Physician: Lurene Shadow, MD Primary Care Physician: Leanna Sato, MD Admit Date: 01/06/2021  Reason for Consultation/Follow-up: Establishing goals of care  Subjective: Patient is resting in bed. No family at bedside. She does not make eye contact during visit and answers questions directly with no elaboration. She no longer requires high flow O2; her O2 has been weaned to 6lpm with regular nasal cannula. She states therapy is coming in to work with her, and she is not interested in going to SNF. She states she is ready to go home.  Would recommend palliative outpatient for GOC with patient and her son once she is home.     Length of Stay: 9  Current Medications: Scheduled Meds:  . apixaban  5 mg Oral BID  . feeding supplement  237 mL Oral QHS  . furosemide  40 mg Intravenous Daily  . gabapentin  300 mg Oral TID  . methylPREDNISolone (SOLU-MEDROL) injection  40 mg Intravenous Daily  . multivitamin with minerals  1 tablet Oral Daily  . pravastatin  10 mg Oral Daily  . senna  1 tablet Oral QHS    Continuous Infusions: . sodium chloride 250 mL (01/13/21 1302)    PRN Meds: sodium chloride, acetaminophen **OR** acetaminophen, hydrALAZINE, ondansetron **OR** ondansetron (ZOFRAN) IV, traMADol  Physical Exam Pulmonary:     Effort: Pulmonary effort is normal.     Comments: O2 in place. Neurological:     Mental Status: [name].             Vital Signs: BP 135/60 (BP Location: Right Arm)   Pulse 74   Temp 98.9 F (37.2 C) (Oral)   Resp 16   Ht 5\' 6"  (1.676 m)   Wt 96.3 kg   SpO2 97%   BMI 34.27 kg/m  SpO2: SpO2: 97 % O2 Device: O2  Device: Nasal Cannula O2 Flow Rate: O2 Flow Rate (L/min): 6 L/min  Intake/output summary:   Intake/Output Summary (Last 24 hours) at 01/16/2021 1045 Last data filed at 01/16/2021 0950 Gross per 24 hour  Intake 840 ml  Output 550 ml  Net 290 ml   LBM: Last BM Date: 01/16/21 Baseline  Weight: Weight: 100 kg Most recent weight: Weight: 96.3 kg        Flowsheet Rows   Flowsheet Row Most Recent Value  Intake Tab   Referral Department Hospitalist  Unit at Time of Referral Cardiac/Telemetry Unit  Palliative Care Primary Diagnosis Pulmonary  Date Notified 01/09/21  Palliative Care Type New Palliative care  Reason for referral Clarify Goals of Care  Date of Admission 01/06/21  Date first seen by Palliative Care 01/10/21  # of days Palliative referral response time 1 Day(s)  # of days IP prior to Palliative referral 3  Clinical Assessment   Palliative Performance Scale Score 40%  Psychosocial & Spiritual Assessment   Palliative Care Outcomes   Patient/Family meeting held? Yes  Who was at the meeting? son  Palliative Care Outcomes Clarified goals of care, Provided psychosocial or spiritual support      Patient Active Problem List   Diagnosis Date Noted  . Community acquired pneumonia   . Lung mass   . Acute respiratory failure with hypoxia (HCC) 01/07/2021  . Acute respiratory distress syndrome (ARDS) due to COVID-19 virus (HCC) 01/07/2021  . Pulmonary emboli (HCC) 01/07/2021  . Severe sepsis with acute organ dysfunction (HCC) 01/06/2021  . Hyperlipidemia 01/06/2021  . Fibromyalgia 01/06/2021  . Essential hypertension 01/06/2021    Palliative Care Assessment & Plan    Recommendations/Plan:  Patient answers questions directly with no elaboration, and without eye contact today. She states she is ready to go home. Would recommend outpatient palliative for GOC discussion with her and her son once she is at home.    Code Status:    Code Status Orders  (From admission,  onward)         Start     Ordered   01/06/21 1523  Full code  Continuous        01/06/21 1524        Code Status History    This patient has a current code status but no historical code status.   Advance Care Planning Activity       Prognosis:   Unable to determine   Thank you for allowing the Palliative Medicine Team to assist in the care of this patient.   Total Time 15 min Prolonged Time Billed no      Greater than 50%  of this time was spent counseling and coordinating care related to the above assessment and plan.  Morton Stall, NP  Please contact Palliative Medicine Team phone at [phone] for questions and concerns.

## 2021-01-17 LAB — BASIC METABOLIC PANEL
Anion gap: 10 (ref 5–15)
BUN: 32 mg/dL — ABNORMAL HIGH (ref 8–23)
CO2: 33 mmol/L — ABNORMAL HIGH (ref 22–32)
Calcium: 9.1 mg/dL (ref 8.9–10.3)
Chloride: 92 mmol/L — ABNORMAL LOW (ref 98–111)
Creatinine, Ser: 0.71 mg/dL (ref 0.44–1.00)
GFR, Estimated: >60 mL/min (ref >60)
Glucose, Bld: 169 mg/dL — ABNORMAL HIGH (ref 70–99)
Potassium: 3.9 mmol/L (ref 3.5–5.1)
Sodium: 135 mmol/L (ref 135–145)

## 2021-01-17 LAB — CBC WITH DIFFERENTIAL/PLATELET
Abs Immature Granulocytes: 1.82 10*3/uL — ABNORMAL HIGH (ref 0.00–0.07)
Basophils Absolute: 0.1 10*3/uL (ref 0.0–0.1)
Basophils Relative: 0 %
Eosinophils Absolute: 0 10*3/uL (ref 0.0–0.5)
Eosinophils Relative: 0 %
HCT: 49.1 % — ABNORMAL HIGH (ref 36.0–46.0)
Hemoglobin: 15.5 g/dL — ABNORMAL HIGH (ref 12.0–15.0)
Immature Granulocytes: 6 %
Lymphocytes Relative: 4 %
Lymphs Abs: 1.4 10*3/uL (ref 0.7–4.0)
MCH: 29.5 pg (ref 26.0–34.0)
MCHC: 31.6 g/dL (ref 30.0–36.0)
MCV: 93.5 fL (ref 80.0–100.0)
Monocytes Absolute: 8.8 10*3/uL — ABNORMAL HIGH (ref 0.1–1.0)
Monocytes Relative: 27 %
Neutro Abs: 20 10*3/uL — ABNORMAL HIGH (ref 1.7–7.7)
Neutrophils Relative %: 63 %
Platelets: 159 10*3/uL (ref 150–400)
RBC: 5.25 MIL/uL — ABNORMAL HIGH (ref 3.87–5.11)
RDW: 15.4 % (ref 11.5–15.5)
Smear Review: NORMAL
WBC: 32.2 10*3/uL — ABNORMAL HIGH (ref 4.0–10.5)
nRBC: 0 % (ref 0.0–0.2)

## 2021-01-17 LAB — MAGNESIUM: Magnesium: 2.1 mg/dL (ref 1.7–2.4)

## 2021-01-17 MED ORDER — GABAPENTIN 100 MG PO CAPS
100.0000 mg | ORAL_CAPSULE | Freq: Three times a day (TID) | ORAL | Status: DC
  Administered 2021-01-17 – 2021-01-23 (×18): 100 mg via ORAL
  Filled 2021-01-17 (×18): qty 1

## 2021-01-17 MED ORDER — PREDNISONE 20 MG PO TABS
40.0000 mg | ORAL_TABLET | Freq: Every day | ORAL | Status: DC
  Administered 2021-01-18 – 2021-01-20 (×3): 40 mg via ORAL
  Filled 2021-01-17 (×3): qty 2

## 2021-01-17 NOTE — TOC Progression Note (Addendum)
Transition of Care [facility]) - Progression Note    Patient Details  Name: Elizabeth Rowland MRN: [id number] Date of Birth: 03/30/1941  Transition of Care [facility] CM/SW Contact  Gildardo Griffes, [place] Phone Number: 01/17/2021, 10:43 AM  Clinical Narrative:     Spoke with patient's son regarding discharge plan, is hopeful patient can return home after weaning off O2 and continuing to work with PT. Reports he was told anticipated dc date is 5/6 or 5/7. CSW spoke with him about home health agencies, no preference. Patient's son does report he lives with patient.   CSW reached out to [place] with [facility], they are able to accept patient for PT and OT services, will add additional services as needed. Pending any equipment needs as well, as son reports they have no equipment at home.   Expected Discharge Plan: Home w Home Health Services Barriers to Discharge: Continued Medical Work up  Expected Discharge Plan and Services Expected Discharge Plan: Home w Home Health Services     Post Acute Care Choice: Home Health Living arrangements for the past 2 months: Single Family Home                           HH Arranged: PT,OT HH Agency: [facility] Date HH Agency Contacted: 01/17/21 Time HH Agency Contacted: [phone] Representative spoke with at [facility] Agency: Barbara Cower   Social Determinants of Health (SDOH) Interventions    Readmission Risk Interventions No flowsheet data found.

## 2021-01-17 NOTE — Progress Notes (Signed)
Name: Elizabeth Rowland MRN: [id number] DOB: 1941/04/02     CONSULTATION DATE: 01/06/2021  REFERRING MD :  Nelson Chimes  CHIEF COMPLAINT:  Shortness of breath    ASSESSMENT AND PLAN   RESPIRATORY:  Acute hypoxemic respiratory failure- improved - O2 therapy is down from 6 to 4L/min - 01/17/21 - patient may have initiation of dc planning from pulmonary perspective         Presumably post covid pneumonitis-changed solumedrol to pred 50 - 01/16/21 - weaning to prednisone 40mg  - 01/18/20         Unlikely PJP pneumonia -pending fungitell       There is pulmonary edema - continue with lasix IV- 40 lasix daily -net >8L negative with preserved renal function           PT OT for atelectasis-patient is improved continue please - 01/16/21        CT with GGO and interstitial thickening, small PE noted on mediastinal cuts  PE not hemodynamic, not causing RV strain, given interstitial disease is not the cause of hypoxia  CV:     ECHO  1. Left ventricular ejection fraction, by estimation, is 55 to 60%. The  left ventricle has normal function. Left ventricular endocardial border  not optimally defined to evaluate regional wall motion. Left ventricular  diastolic parameters are consistent  with Grade I diastolic dysfunction (impaired relaxation).  2. RV systolic function and size are normal. PA pressure.  3. Left atrial size was mildly dilated.  4. Right atrial size was mildly dilated.  5. The mitral valve is normal in structure. No evidence of mitral valve  regurgitation. No evidence of mitral stenosis.  6. The aortic valve is normal in structure. Aortic valve regurgitation is  not visualized. No aortic stenosis is present.   EF preserved, no evidence for overt failure although BNP was 2 fold elevated  Trend and see now that the patient had been diuresed somewhat  ID As above  SUBJECTIVE   HPI: 80 yo WF presented to the ED via EMS for shortness of breath and field saturation in the  40s. She can provide no history at the moment, but her son is at the bedside and the original record is used for this description. The patient has been at home except to see her pain clinic. Her son reports only a few persons visit. He cannot recall any recent (within the month or so) of cough or other prodromal symptoms. The patient was never vaccinated and does not suffer from diabetes, but she is obese. Her COVID screen is negative x2 and a full respiratory [employer] was also negative for a range of viral infections. Despite this a CT was noted to have significant GGO and interstitial infiltrates. Her BNP was two fold elevated, but the pattern does not seem of overt volume overload and an echo revealed preserved LV function. Her procalcitonin is low for pneumonia, however she did have a leukocytosis.   PAST MEDICAL HISTORY :  HTN Fibromyalgia HLD Chronic Pain  Prior to Admission medications   Medication Sig Start Date End Date Taking? Authorizing Provider  Acetaminophen 500 MG capsule Take 500 mg by mouth every 4 (four) hours as needed.   Yes [provider]  gabapentin (NEURONTIN) 300 MG capsule Take 1 capsule by mouth 3 (three) times daily. 11/24/20  Yes [provider]  pravastatin (PRAVACHOL) 10 MG tablet Take 10 mg by mouth daily. 03/26/14  Yes [provider]  traMADol 05/27/14) 50  MG tablet Take 50 mg by mouth 3 (three) times daily as needed. 11/24/20  Yes [provider]   FAMILY HISTORY:  DM, HTN  SOCIAL HISTORY:  reports that she has never smoked. She has never used smokeless tobacco. She reports that she does not drink alcohol and does not use drugs.  REVIEW OF SYSTEMS:   Unable to obtain due to shortness of breath and mental status  Estimated body mass index is 34.27 kg/m as calculated from the following:   Height as of this encounter: 5\' 6"  (1.676 m).   Weight as of this encounter: 96.3 kg.   OBJECTIVE    VITAL SIGNS: Temp:  [97.5 F  (36.4 C)-98.5 F (36.9 C)] 97.8 F (36.6 C) (05/04 1154) Pulse Rate:  [82-98] 91 (05/04 1154) Resp:  [18-24] 18 (05/04 1154) BP: (110-144)/(63-93) 141/76 (05/04 1154) SpO2:  [93 %-100 %] 93 % (05/04 1154)   I/O last 3 completed shifts: In: 1200 [P.O.:1200] Out: 1850 [Urine:1850] Total I/O In: 480 [P.O.:480] Out: 400 [Urine:400]   SpO2: 93 % O2 Flow Rate (L/min): 6 L/min FiO2 (%): 39.5 %   Physical Examination:  GENERAL:critically ill appearing, not in overt resp distress HEAD: Normocephalic, atraumatic.  EYES: Pupils equal, round, reactive to light.  No scleral icterus.  MOUTH: Moist mucosal membrane. NECK: Supple. No JVD.  PULMONARY: rhonchi CARDIOVASCULAR:RRR, no murmur  GASTROINTESTINAL: Soft, nontender, -distended.  Positive bowel sounds.  MUSCULOSKELETAL: trace edema.  NEUROLOGIC: opens eyes, tracks, non-communicating.  SKIN:intact,warm,dry  I personally reviewed lab work that was obtained in last 24 hrs. CXR and CT Independently reviewed, CT image slice appended to this note  MEDICATIONS: I have reviewed all medications and confirmed regimen as documented   CULTURES: Recent Results (from the past 240 hour(s))  MRSA PCR Screening     Status: None   Collection Time: 01/09/21 12:41 PM   Specimen: Nasopharyngeal  Result Value Ref Range Status   MRSA by PCR NEGATIVE NEGATIVE Final    Comment:        The GeneXpert MRSA Assay (FDA approved for NASAL specimens only), is one component of a comprehensive MRSA colonization surveillance program. It is not intended to diagnose MRSA infection nor to guide or monitor treatment for MRSA infections. Performed at [facility], [address] [place], [place]           [id number], M.D.  Pulmonary & Critical Care Medicine  [facility]

## 2021-01-17 NOTE — Progress Notes (Signed)
[facility]                                                                              Patient Demographics  Elizabeth Rowland, is a 80 y.o. female, DOB - 03/28/1941, ZOX:[id number]  Admit date - 01/06/2021   Admitting Physician Marrion Coy, MD  Outpatient Primary MD for the patient is Leanna Sato, MD  Outpatient specialists:   LOS - 10  days   Medical records reviewed and are as summarized below:    Chief Complaint  Patient presents with  . Shortness of Breath    Loss of taiste x 2 weeks, sob, pt from home        Brief summary    Patient is a 80 year old female with history of hypertension, hyperlipidemia, fibromyalgia presented to the hospital for shortness of breath, was hypoxic in ED and requiring 10 L O2.  Patient was found to have severe sepsis secondary to pneumonia, acute pulmonary embolism and acute hypoxic respiratory failure.  She also had ARDS, test for COVID-19 were negative.  Patient was treated with empiric IV antibiotics, IV heparin, IV steroids and IV Lasix.  Subsequently transitioned to oral Eliquis for long-term anticoagulation.  Currently off of HFNC however still on 6 L O2 via [place], weaning down. Pulmonology consulted.  She has a thyroid nodule and outpatient follow-up is recommended for further evaluation.  She also had a right mid to lower lobe mass-like opacity on chest x-ray but this was not evident on CT chest.  Outpatient follow-up is recommended for this as well.  She was evaluated by PT and OT recommended discharge to SNF  Assessment & Plan     Principal problem  Severe sepsis with acute organ dysfunction (HCC), acute respiratory failure with hypoxia, ARDS -Slowly and progressively improving.  She has been tapered off of HFNC and currently on 6 L O2 via [place].  O2 sats 93- 100%, will wean O2 down as tolerated -Completed 8 days of IV antibiotics on 01/13/2021. -Completed 3-day course of high-dose steroids on 4/27, was resumed on  low-dose IV steroids on 5/1.  Now transitioned to oral prednisone. 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 -2D echo showed EF of 55 to 60% with grade 1 diastolic dysfunction, no overt heart failure -Patient started on IV Lasix, negative balance of 7.9 L -Follow BNP    Hyperlipidemia -Continue pravastatin  Leukocytosis -Likely reactive or related to steroids, no fevers  Bilateral pulmonary embolism, right peroneal DVT -Continue eliquis, monitor CBC  Oral thrush -Continue nystatin  Demand ischemia with elevated troponin likely due to severe sepsis, hypoxia, mild volume overload -Currently no chest pain or any worsening cardiac symptoms  Right mid to lower lung mass like opacity on chest x-ray -Incidently seen on chest x-ray but not seen on CT chest.  Outpatient follow-up recommended  Thyroid nodule -Outpatient follow-up recommended  Chronic pain/fibromyalgia Continue tramadol   Mild protein calorie malnutrition due to inadequate oral intake, acute on -Dietitian following, continue nutritional supplements  Obesity Estimated body mass index is 34.27 kg/m  as calculated from the following:   Height as of this encounter: 5\' 6"  (1.676 m).   Weight as of this encounter: 96.3 kg.   Goals of care, generalized debility -Discussed with patient's son at the bedside, recommended SNF as patient is still debilitated, on 6 L O2 via [place].  Patient son wants to follow progress with PT evaluation. Palliative medicine following  Code Status: Full DVT Prophylaxis:  Place TED hose Start: 01/06/21 1523 apixaban (ELIQUIS) tablet 5 mg   Level of Care: Level of care: Progressive Cardiac Family Communication: Discussed all imaging results, lab results, explained to the patient and son at the bedside   Disposition Plan:     Status is: Inpatient  Remains inpatient appropriate  because:Inpatient level of care appropriate due to severity of illness   Dispo: The patient is from: Home              Anticipated d/c is to: TBD              Patient currently is not medically stable to d/c.  On 6 L via [place], weaning down, debilitated   Difficult to place patient No      Time Spent in minutes 35 minutes  Procedures:  None  Consultants:   Pulmonology Palliative medicine  Antimicrobials:   Anti-infectives (From admission, onward)   Start     Dose/Rate Route Frequency Ordered Stop   01/09/21 1230  ceFEPIme (MAXIPIME) 2 g in sodium chloride 0.9 % 100 mL IVPB  Status:  Discontinued        2 g 200 mL/hr over 30 Minutes Intravenous Every 8 hours 01/09/21 1201 01/13/21 1429   01/07/21 1500  cefTRIAXone (ROCEPHIN) 2 g in sodium chloride 0.9 % 100 mL IVPB  Status:  Discontinued        2 g 200 mL/hr over 30 Minutes Intravenous Every 24 hours 01/06/21 1524 01/07/21 1207   01/07/21 1500  azithromycin (ZITHROMAX) 500 mg in sodium chloride 0.9 % 250 mL IVPB        500 mg 250 mL/hr over 60 Minutes Intravenous Every 24 hours 01/06/21 1524 01/12/21 1524   01/06/21 1515  cefTRIAXone (ROCEPHIN) 2 g in sodium chloride 0.9 % 100 mL IVPB        2 g 200 mL/hr over 30 Minutes Intravenous  Once 01/06/21 1506 01/06/21 1546   01/06/21 1515  azithromycin (ZITHROMAX) 500 mg in sodium chloride 0.9 % 250 mL IVPB        500 mg 250 mL/hr over 60 Minutes Intravenous  Once 01/06/21 1506 01/06/21 1645          Medications  Scheduled Meds: . apixaban  5 mg Oral BID  . feeding supplement  237 mL Oral QHS  . furosemide  40 mg Intravenous Daily  . gabapentin  300 mg Oral TID  . multivitamin with minerals  1 tablet Oral Daily  . nystatin  5 mL Oral QID  . pravastatin  10 mg Oral Daily  . predniSONE  50 mg Oral Q breakfast  . senna  1 tablet Oral QHS   Continuous Infusions: . sodium chloride 250 mL (01/13/21 1302)   PRN Meds:.sodium chloride, acetaminophen **OR** acetaminophen,  hydrALAZINE, ondansetron **OR** ondansetron (ZOFRAN) IV, traMADol      Subjective:   Rosilyn MingsElizabeth Putt was seen and examined today.  Overall improving, not currently at baseline.  Still somewhat short of breath, on 6 L. Patient denies dizziness, chest pain,  abdominal pain, N/V/D/C, new weakness, numbess,  tingling. No acute events overnight.    Objective:   Vitals:   01/16/21 1550 01/16/21 1944 01/17/21 0727 01/17/21 0900  BP: (!) 129/93 110/63 (!) 144/93   Pulse: 96 98 82   Resp: 18 (!) 24 19   Temp: 98.5 F (36.9 C) 97.9 F (36.6 C) (!) 97.5 F (36.4 C)   TempSrc:  Oral Oral   SpO2: 96% 95% 100% 93%  Weight:      Height:        Intake/Output Summary (Last 24 hours) at 01/17/2021 1038 Last data filed at 01/17/2021 1013 Gross per 24 hour  Intake 1080 ml  Output 1300 ml  Net -220 ml     Wt Readings from Last 3 Encounters:  01/16/21 96.3 kg     Exam  General: Alert and oriented x 3, NAD  Cardiovascular: S1 S2 auscultated, no murmurs, RRR  Respiratory: Decreased breath sounds in the bases with scattered rhonchi  Gastrointestinal: Soft, nontender, nondistended, + bowel sounds  Ext: no pedal edema bilaterally  Neuro: no neurodeficits  Musculoskeletal: No digital cyanosis, clubbing  Skin: Chronic multiple erythematous maculopapular lesions on bilateral legs  Psych: Normal affect and demeanor, alert and oriented x3    Data Reviewed:  I have personally reviewed following labs and imaging studies  Micro Results Recent Results (from the past 240 hour(s))  MRSA PCR Screening     Status: None   Collection Time: 01/09/21 12:41 PM   Specimen: Nasopharyngeal  Result Value Ref Range Status   MRSA by PCR NEGATIVE NEGATIVE Final    Comment:        The GeneXpert MRSA Assay (FDA approved for NASAL specimens only), is one component of a comprehensive MRSA colonization surveillance program. It is not intended to diagnose MRSA infection nor to guide or monitor  treatment for MRSA infections. Performed at [facility], [address], [place], [place] [address]     Radiology Reports CT Angio Chest PE W and/or Wo Contrast  Addendum Date: 01/06/2021   ADDENDUM REPORT: 01/06/2021 16:38 ADDENDUM: Of note, in the CTA CHEST FINDINGS section, under the Cardiovascular subsection, the original report incorrectly states that there is no pericardial effusion. This section should read that there is a small pericardial effusion. The right report is otherwise unchanged. Electronically Signed   By: Romona Curls M.D.   On: 01/06/2021 16:38   Result Date: 01/06/2021 CLINICAL DATA:  Increased shortness of breath for 2 weeks, concern for pulmonary embolism and metastatic disease. EXAM: CT ANGIOGRAPHY CHEST CT ABDOMEN AND PELVIS WITH CONTRAST TECHNIQUE: Multidetector CT imaging of the chest was performed using the standard protocol during bolus administration of intravenous contrast. Multiplanar CT image reconstructions and MIPs were obtained to evaluate the vascular anatomy. Multidetector CT imaging of the abdomen and pelvis was performed using the standard protocol during bolus administration of intravenous contrast. CONTRAST:  21mL OMNIPAQUE IOHEXOL 350 MG/ML SOLN COMPARISON:  Same day chest radiograph and CT abdomen dated 11/20/2007. FINDINGS: CTA CHEST FINDINGS Cardiovascular: Satisfactory opacification of the pulmonary arteries to the segmental level. Multiple filling defects in segmental and subsegmental pulmonary arteries supplying the right upper lobe and left upper lobe are consistent with pulmonary emboli. The main pulmonary artery is enlarged, measuring 3.3 cm in diameter, suggestive of pulmonary hypertension. Vascular calcifications are seen in the coronary arteries and aortic arch. Normal heart size. No pericardial effusion. Mediastinum/Nodes: Bilateral hilar and mediastinal adenopathy is noted. For reference, a right paratracheal lymph node measures 1.1 cm  short axis (  series 5, image 26). No enlarged axillary lymph nodes. A left thyroid nodule measures 2.9 x 1.5 cm. The trachea and esophagus demonstrate no significant findings. Lungs/Pleura: Severe diffuse bilateral ground-glass opacities and septal line thickening is seen. There is a small left and trace right pleural effusion with associated atelectasis. There is no pneumothorax. Musculoskeletal: No chest wall abnormality. No acute or significant osseous findings. Review of the MIP images confirms the above findings. CT ABDOMEN and PELVIS FINDINGS Hepatobiliary: An 8 mm cyst is seen in the right hepatic lobe. No gallstones, gallbladder wall thickening, or biliary dilatation. Pancreas: Unremarkable. No pancreatic ductal dilatation or surrounding inflammatory changes. Spleen: Normal in size without focal abnormality. Adrenals/Urinary Tract: Adrenal glands are unremarkable. Other than a 2 cm cyst in the left kidney, the kidneys are normal, without renal calculi, focal lesion, or hydronephrosis. Bladder is unremarkable. Stomach/Bowel: There is a small hiatal hernia. Appendix appears normal. No evidence of bowel wall thickening, distention, or inflammatory changes. Vascular/Lymphatic: Aortic atherosclerosis. No enlarged abdominal or pelvic lymph nodes. Reproductive: Uterus and bilateral adnexa are unremarkable. Other: There is a fat containing ventral abdominal wall hernia near the umbilicus. No free intraperitoneal fluid. Musculoskeletal: Degenerative changes are seen in the spine. Review of the MIP images confirms the above findings. IMPRESSION: 1. Multiple filling defects in segmental and subsegmental pulmonary arteries supplying the right upper lobe and left upper lobe, consistent with pulmonary emboli. 2. Severe diffuse bilateral ground-glass opacities and septal line thickening likely represent pulmonary edema/pneumonia. Small left and trace right pleural effusions with associated atelectasis. The previously  questioned masslike opacity overlying the right mid and lower lung likely reflected the right atrial silhouette. 3. Bilateral hilar and mediastinal adenopathy is likely reactive. 4. Left thyroid nodule measures 2.9 x 1.5 cm. Recommend thyroid US (ref: J Am Coll Radiol. 2015 Feb;12(2): 143-50). 5. No acute process in the abdomen or pelvis. Aortic Atherosclerosis (ICD10-I70.0). These results were called by telephone at the time of interpretation on 01/06/2021 at 4:10 pm to provider Dr. Dionne Bucy, who verbally acknowledged these results. Electronically Signed: By: Romona Curls M.D. On: 01/06/2021 16:11   CT ABDOMEN PELVIS W CONTRAST  Addendum Date: 01/06/2021   ADDENDUM REPORT: 01/06/2021 16:38 ADDENDUM: Of note, in the CTA CHEST FINDINGS section, under the Cardiovascular subsection, the original report incorrectly states that there is no pericardial effusion. This section should read that there is a small pericardial effusion. The right report is otherwise unchanged. Electronically Signed   By: Romona Curls M.D.   On: 01/06/2021 16:38   Result Date: 01/06/2021 CLINICAL DATA:  Increased shortness of breath for 2 weeks, concern for pulmonary embolism and metastatic disease. EXAM: CT ANGIOGRAPHY CHEST CT ABDOMEN AND PELVIS WITH CONTRAST TECHNIQUE: Multidetector CT imaging of the chest was performed using the standard protocol during bolus administration of intravenous contrast. Multiplanar CT image reconstructions and MIPs were obtained to evaluate the vascular anatomy. Multidetector CT imaging of the abdomen and pelvis was performed using the standard protocol during bolus administration of intravenous contrast. CONTRAST:  92mL OMNIPAQUE IOHEXOL 350 MG/ML SOLN COMPARISON:  Same day chest radiograph and CT abdomen dated 11/20/2007. FINDINGS: CTA CHEST FINDINGS Cardiovascular: Satisfactory opacification of the pulmonary arteries to the segmental level. Multiple filling defects in segmental and subsegmental  pulmonary arteries supplying the right upper lobe and left upper lobe are consistent with pulmonary emboli. The main pulmonary artery is enlarged, measuring 3.3 cm in diameter, suggestive of pulmonary hypertension. Vascular calcifications are seen in the coronary arteries and  aortic arch. Normal heart size. No pericardial effusion. Mediastinum/Nodes: Bilateral hilar and mediastinal adenopathy is noted. For reference, a right paratracheal lymph node measures 1.1 cm short axis (series 5, image 26). No enlarged axillary lymph nodes. A left thyroid nodule measures 2.9 x 1.5 cm. The trachea and esophagus demonstrate no significant findings. Lungs/Pleura: Severe diffuse bilateral ground-glass opacities and septal line thickening is seen. There is a small left and trace right pleural effusion with associated atelectasis. There is no pneumothorax. Musculoskeletal: No chest wall abnormality. No acute or significant osseous findings. Review of the MIP images confirms the above findings. CT ABDOMEN and PELVIS FINDINGS Hepatobiliary: An 8 mm cyst is seen in the right hepatic lobe. No gallstones, gallbladder wall thickening, or biliary dilatation. Pancreas: Unremarkable. No pancreatic ductal dilatation or surrounding inflammatory changes. Spleen: Normal in size without focal abnormality. Adrenals/Urinary Tract: Adrenal glands are unremarkable. Other than a 2 cm cyst in the left kidney, the kidneys are normal, without renal calculi, focal lesion, or hydronephrosis. Bladder is unremarkable. Stomach/Bowel: There is a small hiatal hernia. Appendix appears normal. No evidence of bowel wall thickening, distention, or inflammatory changes. Vascular/Lymphatic: Aortic atherosclerosis. No enlarged abdominal or pelvic lymph nodes. Reproductive: Uterus and bilateral adnexa are unremarkable. Other: There is a fat containing ventral abdominal wall hernia near the umbilicus. No free intraperitoneal fluid. Musculoskeletal: Degenerative changes  are seen in the spine. Review of the MIP images confirms the above findings. IMPRESSION: 1. Multiple filling defects in segmental and subsegmental pulmonary arteries supplying the right upper lobe and left upper lobe, consistent with pulmonary emboli. 2. Severe diffuse bilateral ground-glass opacities and septal line thickening likely represent pulmonary edema/pneumonia. Small left and trace right pleural effusions with associated atelectasis. The previously questioned masslike opacity overlying the right mid and lower lung likely reflected the right atrial silhouette. 3. Bilateral hilar and mediastinal adenopathy is likely reactive. 4. Left thyroid nodule measures 2.9 x 1.5 cm. Recommend thyroid US (ref: J Am Coll Radiol. 2015 Feb;12(2): 143-50). 5. No acute process in the abdomen or pelvis. Aortic Atherosclerosis (ICD10-I70.0). These results were called by telephone at the time of interpretation on 01/06/2021 at 4:10 pm to provider Dr. Dionne Bucy, who verbally acknowledged these results. Electronically Signed: By: Romona Curls M.D. On: 01/06/2021 16:11   US Venous Img Lower Bilateral (DVT)  Result Date: 01/07/2021 CLINICAL DATA:  Pulmonary embolism. EXAM: BILATERAL LOWER EXTREMITY VENOUS DOPPLER ULTRASOUND TECHNIQUE: Gray-scale sonography with compression, as well as color and duplex ultrasound, were performed to evaluate the deep venous system(s) from the level of the common femoral vein through the popliteal and proximal calf veins. COMPARISON:  None. FINDINGS: VENOUS On the right, hypoechoic thrombus in a noncompressible peroneal vein. Normal compressibility of the common femoral, superficial femoral, and popliteal veins. Visualized portions of profunda femoral vein and great saphenous vein unremarkable. No filling defects to suggest DVT on grayscale or color Doppler imaging. On the left, Normal compressibility of the common femoral, superficial femoral, and popliteal veins, as well as the  visualized calf veins. Visualized portions of profunda femoral vein and great saphenous vein unremarkable. No filling defects to suggest DVT on grayscale or color Doppler imaging. Doppler waveforms show normal direction of venous flow, normal respiratory phasicity and response to augmentation. OTHER None. Limitations: none IMPRESSION: 1. POSITIVE for isolated right peroneal (calf) DVT. 2. Negative for left lower extremity DVT. Electronically Signed   By: Corlis Leak M.D.   On: 01/07/2021 08:03   DG CHEST PORT 1 VIEW  Result Date: 01/08/2021 CLINICAL DATA:  Increased shortness of breath. EXAM: PORTABLE CHEST 1 VIEW COMPARISON:  01/06/2021 chest radiograph and CT chest. FINDINGS: Patient is rotated. Heart is enlarged, stable. Similar to minimally increased diffuse mixed interstitial and airspace opacification. There may be a small right pleural effusion. IMPRESSION: 1. Similar to minimally increased diffuse mixed interstitial and airspace opacification. Findings may be due to edema or viral/atypical pneumonia, including due to COVID-19. 2. Probable small right pleural effusion. Electronically Signed   By: Leanna Battles M.D.   On: 01/08/2021 08:54   DG Chest Portable 1 View  Result Date: 01/06/2021 CLINICAL DATA:  Shortness of breath EXAM: PORTABLE CHEST 1 VIEW COMPARISON:  07/06/2014 FINDINGS: Heart size appears within normal limits. Rounded mass-like opacity within the right mid to lower lung field measuring approximately 6.4 cm in diameter. Diffuse interstitial opacities throughout both lungs. Small right pleural effusion. No pneumothorax. IMPRESSION: 1. Rounded mass-like opacity within the right mid to lower lung field measuring approximately 6.4 cm in diameter. Further evaluation with contrast-enhanced CT of the chest is recommended. 2. Extensive bilateral interstitial opacities which may reflect pulmonary edema or possibly atypical/viral infection. 3. Small right pleural effusion. Electronically Signed    By: Duanne Guess D.O.   On: 01/06/2021 14:12   ECHOCARDIOGRAM COMPLETE  Result Date: 01/08/2021    ECHOCARDIOGRAM REPORT   Patient Name:   Elizabeth Rowland Date of Exam: 01/08/2021 Medical Rec #:  [id number]           Height:       66.0 in Accession #:    [id number]          Weight:       219.4 lb Date of Birth:  [birth year]          BSA:          2.080 m Patient Age:    79 years            BP:           158/58 mmHg Patient Gender: F                   HR:           92 bpm. Exam Location:  [facility] Procedure: 2D Echo, Cardiac Doppler and Color Doppler Indications:     Pulmonary embolus I26.09  History:         Patient has no prior history of Echocardiogram examinations. No                  medical history on file.  Sonographer:     Cristela Blue RDCS (AE) Referring Phys:  [id number] AMY N [facility] Diagnosing Phys: Lorine Bears MD  Sonographer Comments: Technically challenging study due to limited acoustic windows and suboptimal apical window. IMPRESSIONS  1. Left ventricular ejection fraction, by estimation, is 55 to 60%. The left ventricle has normal function. Left ventricular endocardial border not optimally defined to evaluate regional wall motion. Left ventricular diastolic parameters are consistent with Grade I diastolic dysfunction (impaired relaxation).  2. Right ventricular systolic function is normal. The right ventricular size is normal. Tricuspid regurgitation signal is inadequate for assessing PA pressure.  3. Left atrial size was mildly dilated.  4. Right atrial size was mildly dilated.  5. The mitral valve is normal in structure. No evidence of mitral valve regurgitation. No evidence of mitral stenosis.  6. The aortic valve is normal in structure. Aortic valve regurgitation is not visualized. No aortic  stenosis is present. FINDINGS  Left Ventricle: Left ventricular ejection fraction, by estimation, is 55 to 60%. The left ventricle has normal function. Left ventricular endocardial border not optimally  defined to evaluate regional wall motion. The left ventricular internal cavity size was normal in size. There is no left ventricular hypertrophy. Left ventricular diastolic parameters are consistent with Grade I diastolic dysfunction (impaired relaxation). Right Ventricle: The right ventricular size is normal. No increase in right ventricular wall thickness. Right ventricular systolic function is normal. Tricuspid regurgitation signal is inadequate for assessing PA pressure. Left Atrium: Left atrial size was mildly dilated. Right Atrium: Right atrial size was mildly dilated. Pericardium: There is no evidence of pericardial effusion. Mitral Valve: The mitral valve is normal in structure. No evidence of mitral valve regurgitation. No evidence of mitral valve stenosis. Tricuspid Valve: The tricuspid valve is normal in structure. Tricuspid valve regurgitation is not demonstrated. No evidence of tricuspid stenosis. Aortic Valve: The aortic valve is normal in structure. Aortic valve regurgitation is not visualized. No aortic stenosis is present. Aortic valve mean gradient measures 3.0 mmHg. Aortic valve peak gradient measures 5.9 mmHg. Aortic valve area, by VTI measures 3.45 cm. Pulmonic Valve: The pulmonic valve was normal in structure. Pulmonic valve regurgitation is not visualized. No evidence of pulmonic stenosis. Aorta: The aortic root is normal in size and structure. Venous: The inferior vena cava was not well visualized. IAS/Shunts: No atrial level shunt detected by color flow Doppler.  LEFT VENTRICLE PLAX 2D LVIDd:         3.36 cm  Diastology LVIDs:         2.49 cm  LV e' medial:    4.46 cm/s LV PW:         1.08 cm  LV E/e' medial:  20.6 LV IVS:        0.95 cm  LV e' lateral:   6.74 cm/s LVOT diam:     2.00 cm  LV E/e' lateral: 13.6 LV SV:         78 LV SV Index:   37 LVOT Area:     3.14 cm  RIGHT VENTRICLE RV Basal diam:  3.98 cm RV S prime:     12.40 cm/s LEFT ATRIUM             Index       RIGHT ATRIUM            Index LA diam:        4.00 cm 1.92 cm/m  RA Area:     23.30 cm LA Vol (A2C):   54.5 ml 26.20 ml/m RA Volume:   78.60 ml  37.79 ml/m LA Vol (A4C):   56.6 ml 27.21 ml/m LA Biplane Vol: 55.3 ml 26.59 ml/m  AORTIC VALVE                   PULMONIC VALVE AV Area (Vmax):    2.78 cm    PV Vmax:        0.60 m/s AV Area (Vmean):   2.71 cm    PV Peak grad:   1.4 mmHg AV Area (VTI):     3.45 cm    RVOT Peak grad: 4 mmHg AV Vmax:           121.00 cm/s AV Vmean:          86.600 cm/s AV VTI:            0.226 m AV Peak Grad:  5.9 mmHg AV Mean Grad:      3.0 mmHg LVOT Vmax:         107.00 cm/s LVOT Vmean:        74.600 cm/s LVOT VTI:          0.248 m LVOT/AV VTI ratio: 1.10  AORTA Ao Root diam: 2.30 cm MITRAL VALVE                TRICUSPID VALVE MV Area (PHT): 2.74 cm     TR Peak grad:   13.7 mmHg MV Decel Time: 277 msec     TR Vmax:        185.00 cm/s MV E velocity: 91.70 cm/s MV A velocity: 126.00 cm/s  SHUNTS MV E/A ratio:  0.73         Systemic VTI:  0.25 m                             Systemic Diam: 2.00 cm Lorine Bears MD Electronically signed by Lorine Bears MD Signature Date/Time: 01/08/2021/10:40:18 AM    Final     Lab Data:  CBC: Recent Labs  Lab 01/11/21 0606 01/13/21 0452 01/14/21 0506 01/15/21 0948 01/17/21 0423  WBC 11.8* 22.1* 22.1* 20.6* 32.2*  NEUTROABS 8.3*  --  12.1* 15.7* 20.0*  HGB 15.1* 16.6* 15.7* 15.2* 15.5*  HCT 48.0* 53.1* 49.7* 48.3* 49.1*  MCV 94.5 95.2 94.0 92.9 93.5  PLT 171 166 145* 141* 159   Basic Metabolic Panel: Recent Labs  Lab 01/11/21 0606 01/12/21 0518 01/13/21 0452 01/14/21 0506 01/17/21 0423  NA 140 139 138 139 135  K 3.5 3.7 3.4* 4.2 3.9  CL 97* 98 92* 96* 92*  CO2 33* 32 33* 33* 33*  GLUCOSE 191* 192* 159* 140* 169*  BUN 35* 34* 34* 31* 32*  CREATININE 0.78 0.82 0.80 0.65 0.71  CALCIUM 7.7* 7.6* 8.0* 8.0* 9.1  MG  --   --  2.2 2.2 2.1  PHOS  --   --  3.4 3.3  --    GFR: Estimated Creatinine Clearance: 64.5 mL/min (by C-G formula  based on SCr of 0.71 mg/dL). Liver Function Tests: No results for input(s): AST, ALT, ALKPHOS, BILITOT, PROT, ALBUMIN in the last 168 hours. No results for input(s): LIPASE, AMYLASE in the last 168 hours. No results for input(s): AMMONIA in the last 168 hours. Coagulation Profile: No results for input(s): INR, PROTIME in the last 168 hours. Cardiac Enzymes: No results for input(s): CKTOTAL, CKMB, CKMBINDEX, TROPONINI in the last 168 hours. BNP (last 3 results) No results for input(s): PROBNP in the last 8760 hours. HbA1C: No results for input(s): HGBA1C in the last 72 hours. CBG: No results for input(s): GLUCAP in the last 168 hours. Lipid Profile: No results for input(s): CHOL, HDL, LDLCALC, TRIG, CHOLHDL, LDLDIRECT in the last 72 hours. Thyroid Function Tests: No results for input(s): TSH, T4TOTAL, FREET4, T3FREE, THYROIDAB in the last 72 hours. Anemia Panel: No results for input(s): VITAMINB12, FOLATE, FERRITIN, TIBC, IRON, RETICCTPCT in the last 72 hours. Urine analysis:    Component Value Date/Time   COLORURINE AMBER (A) 01/06/2021 0723   APPEARANCEUR CLEAR (A) 01/06/2021 0723   LABSPEC >1.046 (H) 01/06/2021 0723   PHURINE 6.0 01/06/2021 0723   GLUCOSEU NEGATIVE 01/06/2021 0723   HGBUR NEGATIVE 01/06/2021 0723   BILIRUBINUR (A) 01/06/2021 0723    TEST NOT REPORTED DUE TO COLOR INTERFERENCE OF URINE PIGMENT   KETONESUR NEGATIVE  01/06/2021 0723   PROTEINUR NEGATIVE 01/06/2021 0723   NITRITE (A) 01/06/2021 0723    TEST NOT REPORTED DUE TO COLOR INTERFERENCE OF URINE PIGMENT   LEUKOCYTESUR NEGATIVE 01/06/2021 0723     Jeanne Diefendorf M.D. [facility] 01/17/2021, 10:38 AM  Available via Epic secure chat 7am-7pm After 7 pm, please refer to night coverage provider listed on amion.

## 2021-01-18 LAB — BASIC METABOLIC PANEL
Anion gap: 13 (ref 5–15)
BUN: 35 mg/dL — ABNORMAL HIGH (ref 8–23)
CO2: 33 mmol/L — ABNORMAL HIGH (ref 22–32)
Calcium: 9.4 mg/dL (ref 8.9–10.3)
Chloride: 94 mmol/L — ABNORMAL LOW (ref 98–111)
Creatinine, Ser: 0.62 mg/dL (ref 0.44–1.00)
GFR, Estimated: >60 mL/min (ref >60)
Glucose, Bld: 136 mg/dL — ABNORMAL HIGH (ref 70–99)
Potassium: 3.5 mmol/L (ref 3.5–5.1)
Sodium: 140 mmol/L (ref 135–145)

## 2021-01-18 LAB — CBC
HCT: 48.6 % — ABNORMAL HIGH (ref 36.0–46.0)
Hemoglobin: 15.5 g/dL — ABNORMAL HIGH (ref 12.0–15.0)
MCH: 29.9 pg (ref 26.0–34.0)
MCHC: 31.9 g/dL (ref 30.0–36.0)
MCV: 93.6 fL (ref 80.0–100.0)
Platelets: 167 10*3/uL (ref 150–400)
RBC: 5.19 MIL/uL — ABNORMAL HIGH (ref 3.87–5.11)
RDW: 15.8 % — ABNORMAL HIGH (ref 11.5–15.5)
WBC: 31.6 10*3/uL — ABNORMAL HIGH (ref 4.0–10.5)
nRBC: 0 % (ref 0.0–0.2)

## 2021-01-18 LAB — BRAIN NATRIURETIC PEPTIDE: B Natriuretic Peptide: 45.3 pg/mL (ref 0.0–100.0)

## 2021-01-18 LAB — FUNGITELL, SERUM: Fungitell Result: 45 pg/mL (ref <80)

## 2021-01-18 MED ORDER — NYSTATIN 100000 UNIT/ML MT SUSP: 5.0000 mL | Freq: Four times a day (QID) | OROMUCOSAL | 0 refills | Status: DC

## 2021-01-18 MED ORDER — FUROSEMIDE 40 MG PO TABS: 40.0000 mg | ORAL_TABLET | Freq: Every day | ORAL | 11 refills | Status: DC

## 2021-01-18 MED ORDER — POTASSIUM CHLORIDE CRYS ER 20 MEQ PO TBCR
20.0000 meq | EXTENDED_RELEASE_TABLET | Freq: Once | ORAL | Status: AC
  Administered 2021-01-18: 20 meq via ORAL
  Filled 2021-01-18: qty 1

## 2021-01-18 MED ORDER — TRAMADOL HCL 50 MG PO TABS
50.0000 mg | ORAL_TABLET | Freq: Three times a day (TID) | ORAL | Status: DC | PRN
Start: 2021-01-18 — End: 2021-01-19
  Administered 2021-01-18 – 2021-01-19 (×3): 50 mg via ORAL
  Filled 2021-01-18 (×3): qty 1

## 2021-01-18 MED ORDER — TRAMADOL HCL 50 MG PO TABS: 50.0000 mg | ORAL_TABLET | Freq: Three times a day (TID) | ORAL | 0 refills | Status: DC | PRN

## 2021-01-18 MED ORDER — PREDNISONE 10 MG PO TABS: ORAL_TABLET | ORAL | 0 refills | Status: DC

## 2021-01-18 MED ORDER — FUROSEMIDE 40 MG PO TABS
40.0000 mg | ORAL_TABLET | Freq: Every day | ORAL | Status: DC
  Administered 2021-01-19 – 2021-01-23 (×5): 40 mg via ORAL
  Filled 2021-01-18 (×5): qty 1

## 2021-01-18 MED ORDER — APIXABAN 5 MG PO TABS: 5.0000 mg | ORAL_TABLET | Freq: Two times a day (BID) | ORAL | 3 refills | Status: AC

## 2021-01-18 MED ORDER — ADULT MULTIVITAMIN W/MINERALS CH: 1.0000 | ORAL_TABLET | Freq: Every day | ORAL | 3 refills | Status: DC

## 2021-01-18 NOTE — Progress Notes (Signed)
Physical Therapy Treatment Patient Details Name: EILISH MCDANIEL MRN: [id number] DOB: 09-Mar-1941 Today's Date: 01/18/2021    History of Present Illness 80 y.o. female with medical history significant for fibromyalgia, history of hypertension, history of hyperlipidemia, currently on tramadol and gabapentin for chronic pain and fibromyalgia, no longer taking antihypertensives for pravastatin, presents to the emergency department for chief concerns of shortness of breath.  Found to have  acute respiratory failure, ARDS. CT angiogram showed multiple PE with extensive groundglass opacities as well as interstitial changes. COVID PCR negative    PT Comments    Patient agreeable to PT with encouragement. Patient continues to require physical assistance for all mobility. Patient needed assistance to sit up on edge of bed and reports dizziness while sitting upright. Sp02 is 90% on 2 L02 in sitting. With encouragement and cues for techniques, patient was able to stand with maximal assistance for the transfer. Standing tolerance was only 10 seconds and patient feels too weak to attempt standing again. Unable to walk at this time due to poor standing tolerance and fatigue with minimal activity. Sp02 decreased to 83% on 2 L02 after standing and increased to 90% after 3-4 minute rest break in the bed and cues for breathing techniques.  Patient continues to have significant weakness and require physical assistance with mobility. SNF is recommended, however patient is adamantly refusing and wants to go home. PT will continue to follow to maximize independence and decrease caregiver burden.     Follow Up Recommendations  SNF     Equipment Recommendations  Hospital bed;Wheelchair (measurements PT);3in1 (PT);Rolling walker with 5" wheels (If patient is going home. otherwise defer to next level of care)    Recommendations for Other Services       Precautions / Restrictions Precautions Precautions:  Fall Precaution Comments: per nurse, keep Sp02 above 85% Restrictions Weight Bearing Restrictions: No RLE Weight Bearing: Weight bearing as tolerated LLE Weight Bearing: Weight bearing as tolerated    Mobility  Bed Mobility Overal bed mobility: Needs Assistance Bed Mobility: Supine to Sit;Sit to Supine Rolling: Mod assist   Supine to sit: Max assist Sit to supine: Max assist   General bed mobility comments: assistance for LE and trunk support. verbal cues for technique. increased time required to complet tasks and patient fatigued with minimal activity    Transfers Overall transfer level: Needs assistance Equipment used: Rolling walker (2 wheeled) Transfers: Sit to/from Stand Sit to Stand: +2 physical assistance;+2 safety/equipment;Mod assist         General transfer comment: lifting and lowering assistance provided. unable to stand with first attempt. with second attempt, patient able to come to full standing position with maximal assistance. limited standing tolerance of 10 seconds and unable to perform repeat trials of standing due to fatigue  Ambulation/Gait             General Gait Details: unable to ambulate due to fatigue, generalized weakness, limited standing tolerance   Stairs             Wheelchair Mobility    Modified Rankin (Stroke Patients Only)       Balance Overall balance assessment: Needs assistance Sitting-balance support: Single extremity supported Sitting balance-Leahy Scale: Fair     Standing balance support: Bilateral upper extremity supported Standing balance-Leahy Scale: Poor Standing balance comment: Min A- Mod A for standing balance with rolling walker ofr UE support. standing tolerance of 10 seconds  Cognition Arousal/Alertness: Awake/alert Behavior During Therapy: Flat affect Overall Cognitive Status: Within Functional Limits for tasks assessed                                  General Comments: Limited insight to deficits. Pt saturated in urine and unaware. Pt having BM in bed but did not alert staff.      Exercises      General Comments        Pertinent Vitals/Pain Pain Assessment: No/denies pain Pain Score: 2  Pain Location: Ribs Pain Descriptors / Indicators: Discomfort Pain Intervention(s): Monitored during session    Home Living Family/patient expects to be discharged to:: Private residence   Available Help at Discharge: Family   Home Access: Level entry   Home Layout: One level Home Equipment: Environmental consultant - 2 wheels Additional Comments: information is per patient report    Prior Function Level of Independence: Independent      Comments: Pt reports she does all IADL tasks but does not drive   PT Goals (current goals can now be found in the care plan section) Acute Rehab PT Goals Patient Stated Goal: to go home PT Goal Formulation: With patient Time For Goal Achievement: 01/25/21 Potential to Achieve Goals: Fair Progress towards PT goals: Progressing toward goals    Frequency    Min 2X/week      PT Plan Current plan remains appropriate    Co-evaluation              AM-PAC PT "6 Clicks" Mobility   Outcome Measure  Help needed turning from your back to your side while in a flat bed without using bedrails?: A Lot Help needed moving from lying on your back to sitting on the side of a flat bed without using bedrails?: A Lot Help needed moving to and from a bed to a chair (including a wheelchair)?: A Lot Help needed standing up from a chair using your arms (e.g., wheelchair or bedside chair)?: Total Help needed to walk in hospital room?: Total   6 Click Score: 8    End of Session Equipment Utilized During Treatment: Oxygen Activity Tolerance: Patient limited by fatigue Patient left: in bed;with call bell/phone within reach;with bed alarm set Nurse Communication: Mobility status PT Visit Diagnosis: Muscle  weakness (generalized) (M62.81);Unsteadiness on feet (R26.81);Difficulty in walking, not elsewhere classified (R26.2)     Time: 5277-8242 PT Time Calculation (min) (ACUTE ONLY): 24 min  Charges:  $Therapeutic Activity: 23-37 mins                     Donna Bernard, PT, MPT    Ina Homes 01/18/2021, 1:11 PM

## 2021-01-18 NOTE — Progress Notes (Signed)
[facility]                                                                              Patient Demographics  Elizabeth Rowland, is a 80 y.o. female, DOB - October 29, 1940, ZOX:[id number]  Admit date - 01/06/2021   Admitting Physician Marrion Coy, MD  Outpatient Primary MD for the patient is Leanna Sato, MD  Outpatient specialists:   LOS - 11  days   Medical records reviewed and are as summarized below:    Chief Complaint  Patient presents with  . Shortness of Breath    Loss of taiste x 2 weeks, sob, pt from home        Brief summary    Patient is a 80 year old female with history of hypertension, hyperlipidemia, fibromyalgia presented to the hospital for shortness of breath, was hypoxic in ED and requiring 10 L O2.  Patient was found to have severe sepsis secondary to pneumonia, acute pulmonary embolism and acute hypoxic respiratory failure.  She also had ARDS, test for COVID-19 were negative.  Patient was treated with empiric IV antibiotics, IV heparin, IV steroids and IV Lasix.  Subsequently transitioned to oral Eliquis for long-term anticoagulation.  Currently off of HFNC however still on 6 L O2 via [place], weaning down. Pulmonology consulted.  She has a thyroid nodule and outpatient follow-up is recommended for further evaluation.  She also had a right mid to lower lobe mass-like opacity on chest x-ray but this was not evident on CT chest.  Outpatient follow-up is recommended for this as well.  She was evaluated by PT and OT recommended discharge to SNF  Assessment & Plan     Principal problem  Severe sepsis with acute organ dysfunction (HCC), acute respiratory failure with hypoxia, ARDS -Slowly and progressively improving.  She has been tapered off of HFNC, weaned down to 2 L O2 via [place] -Completed 8 days of IV antibiotics on 01/13/2021. -Completed 3-day course of high-dose steroids on 4/27, was resumed on low-dose IV steroids on 5/1.  Now transitioned to oral  prednisone, will need prednisone taper at the time of discharge. 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 -2D echo showed EF of 55 to 60% with grade 1 diastolic dysfunction, no overt heart failure -Patient was placed on IV Lasix, negative balance of 9.2 L -BNP improved, 45, creatinine remains stable -Transition to oral Lasix 40 mg daily from 5/6    Hyperlipidemia -Continue pravastatin  Leukocytosis -Likely reactive or related to steroids, no fevers  Bilateral pulmonary embolism, right peroneal DVT -Continue eliquis, monitor CBC  Oral thrush -Continue nystatin  Demand ischemia with elevated troponin likely due to severe sepsis, hypoxia, mild volume overload -Currently no chest pain or any worsening cardiac symptoms  Right mid to lower lung mass like opacity on chest x-ray -Incidently seen on chest x-ray but not seen on CT chest.  Outpatient follow-up recommended  Thyroid nodule -Outpatient follow-up recommended  Chronic pain/fibromyalgia Continue tramadol   Mild protein calorie malnutrition due to inadequate oral intake, acute on -Dietitian following,  continue nutritional supplements  Obesity Estimated body mass index is 33.75 kg/m as calculated from the following:   Height as of this encounter:  (1.676 m).   Weight as of this encounter: 94.8 kg.   Goals of care, generalized debility -Discussed with patient's son at the bedside, recommended SNF as patient is still debilitated.  Patient however wants to go home and his son okay with patient's wishes. PT also feels patient is too debilitated and if son would be able to assist with ADLs.  If he is not, then will recommend SNF.  Code Status: Full DVT Prophylaxis:  Place TED hose Start: 01/06/21 1523 apixaban (ELIQUIS) tablet 5 mg   Level of Care: Level of care: Progressive Cardiac Family Communication:  Discussed all imaging results, lab results, explained to the patient and son at the bedside   Disposition Plan:     Status is: Inpatient  Remains inpatient appropriate because:Inpatient level of care appropriate due to severity of illness   Dispo: The patient is from: Home              Anticipated d/c is to: TBD              Patient currently is not medically stable to d/c.     Difficult to place patient No      Time Spent in minutes 35 minutes  Procedures:  None  Consultants:   Pulmonology Palliative medicine  Antimicrobials:   Anti-infectives (From admission, onward)   Start     Dose/Rate Route Frequency Ordered Stop   01/09/21 1230  ceFEPIme (MAXIPIME) 2 g in sodium chloride 0.9 % 100 mL IVPB  Status:  Discontinued        2 g 200 mL/hr over 30 Minutes Intravenous Every 8 hours 01/09/21 1201 01/13/21 1429   01/07/21 1500  cefTRIAXone (ROCEPHIN) 2 g in sodium chloride 0.9 % 100 mL IVPB  Status:  Discontinued        2 g 200 mL/hr over 30 Minutes Intravenous Every 24 hours 01/06/21 1524 01/07/21 1207   01/07/21 1500  azithromycin (ZITHROMAX) 500 mg in sodium chloride 0.9 % 250 mL IVPB        500 mg 250 mL/hr over 60 Minutes Intravenous Every 24 hours 01/06/21 1524 01/12/21 1524   01/06/21 1515  cefTRIAXone (ROCEPHIN) 2 g in sodium chloride 0.9 % 100 mL IVPB        2 g 200 mL/hr over 30 Minutes Intravenous  Once 01/06/21 1506 01/06/21 1546   01/06/21 1515  azithromycin (ZITHROMAX) 500 mg in sodium chloride 0.9 % 250 mL IVPB        500 mg 250 mL/hr over 60 Minutes Intravenous  Once 01/06/21 1506 01/06/21 1645         Medications  Scheduled Meds: . apixaban  5 mg Oral BID  . feeding supplement  237 mL Oral QHS  . gabapentin  100 mg Oral TID  . multivitamin with minerals  1 tablet Oral Daily  . nystatin  5 mL Oral QID  . pravastatin  10 mg Oral Daily  . predniSONE  40 mg Oral Q breakfast  . senna  1 tablet Oral QHS   Continuous Infusions: . sodium chloride  250 mL (01/13/21 1302)   PRN Meds:.sodium chloride, acetaminophen **OR** acetaminophen, hydrALAZINE, ondansetron **OR** ondansetron (ZOFRAN) IV      Subjective:   Elizabeth Rowland was seen and examined today.  Wants to go home states has been here for 2 weeks  and O2 sats are improving.  Still weak.  Son at the bedside.  Patient denies dizziness, chest pain,  abdominal pain, N/V/D/C, new weakness, numbess, tingling. No acute events overnight.    Objective:   Vitals:   01/18/21 0346 01/18/21 0732 01/18/21 0900 01/18/21 1152  BP: 134/76 132/62  (!) 142/74  Pulse: 87 84  98  Resp: 18 12  18   Temp: 97.7 F (36.5 C) 98.1 F (36.7 C)  98.6 F (37 C)  TempSrc: Oral Oral  Oral  SpO2: 98% 98%  (!) 89%  Weight:  95.2 kg 94.8 kg   Height:        Intake/Output Summary (Last 24 hours) at 01/18/2021 1556 Last data filed at 01/18/2021 1442 Gross per 24 hour  Intake 850 ml  Output 1600 ml  Net -750 ml     Wt Readings from Last 3 Encounters:  01/18/21 94.8 kg   Physical Exam  General: Alert and oriented x 3, NAD, frail and ill-appearing  Cardiovascular: S1 S2 clear, RRR.  Trace pedal edema b/l  Respiratory: Diminished breath sound at the bases  Gastrointestinal: Soft, nontender, nondistended, NBS  Ext: trace pedal edema bilaterally  Neuro: no new deficits  Musculoskeletal: No cyanosis, clubbing  Skin: Chronic erythematous lesions on bilateral legs  Psych: Normal affect and demeanor, alert and oriented x3     Data Reviewed:  I have personally reviewed following labs and imaging studies  Micro Results Recent Results (from the past 240 hour(s))  MRSA PCR Screening     Status: None   Collection Time: 01/09/21 12:41 PM   Specimen: Nasopharyngeal  Result Value Ref Range Status   MRSA by PCR NEGATIVE NEGATIVE Final    Comment:        The GeneXpert MRSA Assay (FDA approved for NASAL specimens only), is one component of a comprehensive MRSA colonization surveillance  program. It is not intended to diagnose MRSA infection nor to guide or monitor treatment for MRSA infections. Performed at [facility], [address] [place], [place]     Radiology Reports CT Angio Chest PE W and/or Wo Contrast  Addendum Date: 01/06/2021   ADDENDUM REPORT: 01/06/2021 16:38 ADDENDUM: Of note, in the CTA CHEST FINDINGS section, under the Cardiovascular subsection, the original report incorrectly states that there is no pericardial effusion. This section should read that there is a small pericardial effusion. The right report is otherwise unchanged. Electronically Signed   By: 01/08/2021 M.D.   On: 01/06/2021 16:38   Result Date: 01/06/2021 CLINICAL DATA:  Increased shortness of breath for 2 weeks, concern for pulmonary embolism and metastatic disease. EXAM: CT ANGIOGRAPHY CHEST CT ABDOMEN AND PELVIS WITH CONTRAST TECHNIQUE: Multidetector CT imaging of the chest was performed using the standard protocol during bolus administration of intravenous contrast. Multiplanar CT image reconstructions and MIPs were obtained to evaluate the vascular anatomy. Multidetector CT imaging of the abdomen and pelvis was performed using the standard protocol during bolus administration of intravenous contrast. CONTRAST:  50mL OMNIPAQUE IOHEXOL 350 MG/ML SOLN COMPARISON:  Same day chest radiograph and CT abdomen dated 11/20/2007. FINDINGS: CTA CHEST FINDINGS Cardiovascular: Satisfactory opacification of the pulmonary arteries to the segmental level. Multiple filling defects in segmental and subsegmental pulmonary arteries supplying the right upper lobe and left upper lobe are consistent with pulmonary emboli. The main pulmonary artery is enlarged, measuring 3.3 cm in diameter, suggestive of pulmonary hypertension. Vascular calcifications are seen in the coronary arteries and aortic arch. Normal heart  size. No pericardial effusion. Mediastinum/Nodes: Bilateral hilar and mediastinal  adenopathy is noted. For reference, a right paratracheal lymph node measures 1.1 cm short axis (series 5, image 26). No enlarged axillary lymph nodes. A left thyroid nodule measures 2.9 x 1.5 cm. The trachea and esophagus demonstrate no significant findings. Lungs/Pleura: Severe diffuse bilateral ground-glass opacities and septal line thickening is seen. There is a small left and trace right pleural effusion with associated atelectasis. There is no pneumothorax. Musculoskeletal: No chest wall abnormality. No acute or significant osseous findings. Review of the MIP images confirms the above findings. CT ABDOMEN and PELVIS FINDINGS Hepatobiliary: An 8 mm cyst is seen in the right hepatic lobe. No gallstones, gallbladder wall thickening, or biliary dilatation. Pancreas: Unremarkable. No pancreatic ductal dilatation or surrounding inflammatory changes. Spleen: Normal in size without focal abnormality. Adrenals/Urinary Tract: Adrenal glands are unremarkable. Other than a 2 cm cyst in the left kidney, the kidneys are normal, without renal calculi, focal lesion, or hydronephrosis. Bladder is unremarkable. Stomach/Bowel: There is a small hiatal hernia. Appendix appears normal. No evidence of bowel wall thickening, distention, or inflammatory changes. Vascular/Lymphatic: Aortic atherosclerosis. No enlarged abdominal or pelvic lymph nodes. Reproductive: Uterus and bilateral adnexa are unremarkable. Other: There is a fat containing ventral abdominal wall hernia near the umbilicus. No free intraperitoneal fluid. Musculoskeletal: Degenerative changes are seen in the spine. Review of the MIP images confirms the above findings. IMPRESSION: 1. Multiple filling defects in segmental and subsegmental pulmonary arteries supplying the right upper lobe and left upper lobe, consistent with pulmonary emboli. 2. Severe diffuse bilateral ground-glass opacities and septal line thickening likely represent pulmonary edema/pneumonia. Small left  and trace right pleural effusions with associated atelectasis. The previously questioned masslike opacity overlying the right mid and lower lung likely reflected the right atrial silhouette. 3. Bilateral hilar and mediastinal adenopathy is likely reactive. 4. Left thyroid nodule measures 2.9 x 1.5 cm. Recommend thyroid US (ref: J Am Coll Radiol. 2015 Feb;12(2): 143-50). 5. No acute process in the abdomen or pelvis. Aortic Atherosclerosis (ICD10-I70.0). These results were called by telephone at the time of interpretation on 01/06/2021 at 4:10 pm to provider Dr. Dionne Bucy, who verbally acknowledged these results. Electronically Signed: By: Romona Curls M.D. On: 01/06/2021 16:11   CT ABDOMEN PELVIS W CONTRAST  Addendum Date: 01/06/2021   ADDENDUM REPORT: 01/06/2021 16:38 ADDENDUM: Of note, in the CTA CHEST FINDINGS section, under the Cardiovascular subsection, the original report incorrectly states that there is no pericardial effusion. This section should read that there is a small pericardial effusion. The right report is otherwise unchanged. Electronically Signed   By: Romona Curls M.D.   On: 01/06/2021 16:38   Result Date: 01/06/2021 CLINICAL DATA:  Increased shortness of breath for 2 weeks, concern for pulmonary embolism and metastatic disease. EXAM: CT ANGIOGRAPHY CHEST CT ABDOMEN AND PELVIS WITH CONTRAST TECHNIQUE: Multidetector CT imaging of the chest was performed using the standard protocol during bolus administration of intravenous contrast. Multiplanar CT image reconstructions and MIPs were obtained to evaluate the vascular anatomy. Multidetector CT imaging of the abdomen and pelvis was performed using the standard protocol during bolus administration of intravenous contrast. CONTRAST:  48mL OMNIPAQUE IOHEXOL 350 MG/ML SOLN COMPARISON:  Same day chest radiograph and CT abdomen dated 11/20/2007. FINDINGS: CTA CHEST FINDINGS Cardiovascular: Satisfactory opacification of the pulmonary arteries  to the segmental level. Multiple filling defects in segmental and subsegmental pulmonary arteries supplying the right upper lobe and left upper lobe are consistent with pulmonary  emboli. The main pulmonary artery is enlarged, measuring 3.3 cm in diameter, suggestive of pulmonary hypertension. Vascular calcifications are seen in the coronary arteries and aortic arch. Normal heart size. No pericardial effusion. Mediastinum/Nodes: Bilateral hilar and mediastinal adenopathy is noted. For reference, a right paratracheal lymph node measures 1.1 cm short axis (series 5, image 26). No enlarged axillary lymph nodes. A left thyroid nodule measures 2.9 x 1.5 cm. The trachea and esophagus demonstrate no significant findings. Lungs/Pleura: Severe diffuse bilateral ground-glass opacities and septal line thickening is seen. There is a small left and trace right pleural effusion with associated atelectasis. There is no pneumothorax. Musculoskeletal: No chest wall abnormality. No acute or significant osseous findings. Review of the MIP images confirms the above findings. CT ABDOMEN and PELVIS FINDINGS Hepatobiliary: An 8 mm cyst is seen in the right hepatic lobe. No gallstones, gallbladder wall thickening, or biliary dilatation. Pancreas: Unremarkable. No pancreatic ductal dilatation or surrounding inflammatory changes. Spleen: Normal in size without focal abnormality. Adrenals/Urinary Tract: Adrenal glands are unremarkable. Other than a 2 cm cyst in the left kidney, the kidneys are normal, without renal calculi, focal lesion, or hydronephrosis. Bladder is unremarkable. Stomach/Bowel: There is a small hiatal hernia. Appendix appears normal. No evidence of bowel wall thickening, distention, or inflammatory changes. Vascular/Lymphatic: Aortic atherosclerosis. No enlarged abdominal or pelvic lymph nodes. Reproductive: Uterus and bilateral adnexa are unremarkable. Other: There is a fat containing ventral abdominal wall hernia near the  umbilicus. No free intraperitoneal fluid. Musculoskeletal: Degenerative changes are seen in the spine. Review of the MIP images confirms the above findings. IMPRESSION: 1. Multiple filling defects in segmental and subsegmental pulmonary arteries supplying the right upper lobe and left upper lobe, consistent with pulmonary emboli. 2. Severe diffuse bilateral ground-glass opacities and septal line thickening likely represent pulmonary edema/pneumonia. Small left and trace right pleural effusions with associated atelectasis. The previously questioned masslike opacity overlying the right mid and lower lung likely reflected the right atrial silhouette. 3. Bilateral hilar and mediastinal adenopathy is likely reactive. 4. Left thyroid nodule measures 2.9 x 1.5 cm. Recommend thyroid US (ref: J Am Coll Radiol. 2015 Feb;12(2): 143-50). 5. No acute process in the abdomen or pelvis. Aortic Atherosclerosis (ICD10-I70.0). These results were called by telephone at the time of interpretation on 01/06/2021 at 4:10 pm to provider Dr. Dionne BucySebastian Siadecki, who verbally acknowledged these results. Electronically Signed: By: Romona Curlsyler  Litton M.D. On: 01/06/2021 16:11   US Venous Img Lower Bilateral (DVT)  Result Date: 01/07/2021 CLINICAL DATA:  Pulmonary embolism. EXAM: BILATERAL LOWER EXTREMITY VENOUS DOPPLER ULTRASOUND TECHNIQUE: Gray-scale sonography with compression, as well as color and duplex ultrasound, were performed to evaluate the deep venous system(s) from the level of the common femoral vein through the popliteal and proximal calf veins. COMPARISON:  None. FINDINGS: VENOUS On the right, hypoechoic thrombus in a noncompressible peroneal vein. Normal compressibility of the common femoral, superficial femoral, and popliteal veins. Visualized portions of profunda femoral vein and great saphenous vein unremarkable. No filling defects to suggest DVT on grayscale or color Doppler imaging. On the left, Normal compressibility of the  common femoral, superficial femoral, and popliteal veins, as well as the visualized calf veins. Visualized portions of profunda femoral vein and great saphenous vein unremarkable. No filling defects to suggest DVT on grayscale or color Doppler imaging. Doppler waveforms show normal direction of venous flow, normal respiratory phasicity and response to augmentation. OTHER None. Limitations: none IMPRESSION: 1. POSITIVE for isolated right peroneal (calf) DVT. 2. Negative for left  lower extremity DVT. Electronically Signed   By: Corlis Leak M.D.   On: 01/07/2021 08:03   DG CHEST PORT 1 VIEW  Result Date: 01/08/2021 CLINICAL DATA:  Increased shortness of breath. EXAM: PORTABLE CHEST 1 VIEW COMPARISON:  01/06/2021 chest radiograph and CT chest. FINDINGS: Patient is rotated. Heart is enlarged, stable. Similar to minimally increased diffuse mixed interstitial and airspace opacification. There may be a small right pleural effusion. IMPRESSION: 1. Similar to minimally increased diffuse mixed interstitial and airspace opacification. Findings may be due to edema or viral/atypical pneumonia, including due to COVID-19. 2. Probable small right pleural effusion. Electronically Signed   By: Leanna Battles M.D.   On: 01/08/2021 08:54   DG Chest Portable 1 View  Result Date: 01/06/2021 CLINICAL DATA:  Shortness of breath EXAM: PORTABLE CHEST 1 VIEW COMPARISON:  07/06/2014 FINDINGS: Heart size appears within normal limits. Rounded mass-like opacity within the right mid to lower lung field measuring approximately 6.4 cm in diameter. Diffuse interstitial opacities throughout both lungs. Small right pleural effusion. No pneumothorax. IMPRESSION: 1. Rounded mass-like opacity within the right mid to lower lung field measuring approximately 6.4 cm in diameter. Further evaluation with contrast-enhanced CT of the chest is recommended. 2. Extensive bilateral interstitial opacities which may reflect pulmonary edema or possibly  atypical/viral infection. 3. Small right pleural effusion. Electronically Signed   By: Duanne Guess D.O.   On: 01/06/2021 14:12   ECHOCARDIOGRAM COMPLETE  Result Date: 01/08/2021    ECHOCARDIOGRAM REPORT   Patient Name:   MARGARETANN ABATE Ramella Date of Exam: 01/08/2021 Medical Rec #:  [id number]           Height:       66.0 in Accession #:    [id number]          Weight:       219.4 lb Date of Birth:  1940-12-21          BSA:          2.080 m Patient Age:    79 years            BP:           158/58 mmHg Patient Gender: F                   HR:           92 bpm. Exam Location:  [facility] Procedure: 2D Echo, Cardiac Doppler and Color Doppler Indications:     Pulmonary embolus I26.09  History:         Patient has no prior history of Echocardiogram examinations. No                  medical history on file.  Sonographer:     Cristela Blue RDCS (AE) Referring Phys:  [id number] AMY N [facility] Diagnosing Phys: Lorine Bears MD  Sonographer Comments: Technically challenging study due to limited acoustic windows and suboptimal apical window. IMPRESSIONS  1. Left ventricular ejection fraction, by estimation, is 55 to 60%. The left ventricle has normal function. Left ventricular endocardial border not optimally defined to evaluate regional wall motion. Left ventricular diastolic parameters are consistent with Grade I diastolic dysfunction (impaired relaxation).  2. Right ventricular systolic function is normal. The right ventricular size is normal. Tricuspid regurgitation signal is inadequate for assessing PA pressure.  3. Left atrial size was mildly dilated.  4. Right atrial size was mildly dilated.  5. The mitral valve is normal in structure. No evidence of  mitral valve regurgitation. No evidence of mitral stenosis.  6. The aortic valve is normal in structure. Aortic valve regurgitation is not visualized. No aortic stenosis is present. FINDINGS  Left Ventricle: Left ventricular ejection fraction, by estimation, is 55 to 60%. The left  ventricle has normal function. Left ventricular endocardial border not optimally defined to evaluate regional wall motion. The left ventricular internal cavity size was normal in size. There is no left ventricular hypertrophy. Left ventricular diastolic parameters are consistent with Grade I diastolic dysfunction (impaired relaxation). Right Ventricle: The right ventricular size is normal. No increase in right ventricular wall thickness. Right ventricular systolic function is normal. Tricuspid regurgitation signal is inadequate for assessing PA pressure. Left Atrium: Left atrial size was mildly dilated. Right Atrium: Right atrial size was mildly dilated. Pericardium: There is no evidence of pericardial effusion. Mitral Valve: The mitral valve is normal in structure. No evidence of mitral valve regurgitation. No evidence of mitral valve stenosis. Tricuspid Valve: The tricuspid valve is normal in structure. Tricuspid valve regurgitation is not demonstrated. No evidence of tricuspid stenosis. Aortic Valve: The aortic valve is normal in structure. Aortic valve regurgitation is not visualized. No aortic stenosis is present. Aortic valve mean gradient measures 3.0 mmHg. Aortic valve peak gradient measures 5.9 mmHg. Aortic valve area, by VTI measures 3.45 cm. Pulmonic Valve: The pulmonic valve was normal in structure. Pulmonic valve regurgitation is not visualized. No evidence of pulmonic stenosis. Aorta: The aortic root is normal in size and structure. Venous: The inferior vena cava was not well visualized. IAS/Shunts: No atrial level shunt detected by color flow Doppler.  LEFT VENTRICLE PLAX 2D LVIDd:         3.36 cm  Diastology LVIDs:         2.49 cm  LV e' medial:    4.46 cm/s LV PW:         1.08 cm  LV E/e' medial:  20.6 LV IVS:        0.95 cm  LV e' lateral:   6.74 cm/s LVOT diam:     2.00 cm  LV E/e' lateral: 13.6 LV SV:         78 LV SV Index:   37 LVOT Area:     3.14 cm  RIGHT VENTRICLE RV Basal diam:  3.98 cm  RV S prime:     12.40 cm/s LEFT ATRIUM             Index       RIGHT ATRIUM           Index LA diam:        4.00 cm 1.92 cm/m  RA Area:     23.30 cm LA Vol (A2C):   54.5 ml 26.20 ml/m RA Volume:   78.60 ml  37.79 ml/m LA Vol (A4C):   56.6 ml 27.21 ml/m LA Biplane Vol: 55.3 ml 26.59 ml/m  AORTIC VALVE                   PULMONIC VALVE AV Area (Vmax):    2.78 cm    PV Vmax:        0.60 m/s AV Area (Vmean):   2.71 cm    PV Peak grad:   1.4 mmHg AV Area (VTI):     3.45 cm    RVOT Peak grad: 4 mmHg AV Vmax:           121.00 cm/s AV Vmean:  86.600 cm/s AV VTI:            0.226 m AV Peak Grad:      5.9 mmHg AV Mean Grad:      3.0 mmHg LVOT Vmax:         107.00 cm/s LVOT Vmean:        74.600 cm/s LVOT VTI:          0.248 m LVOT/AV VTI ratio: 1.10  AORTA Ao Root diam: 2.30 cm MITRAL VALVE                TRICUSPID VALVE MV Area (PHT): 2.74 cm     TR Peak grad:   13.7 mmHg MV Decel Time: 277 msec     TR Vmax:        185.00 cm/s MV E velocity: 91.70 cm/s MV A velocity: 126.00 cm/s  SHUNTS MV E/A ratio:  0.73         Systemic VTI:  0.25 m                             Systemic Diam: 2.00 cm Lorine Bears MD Electronically signed by Lorine Bears MD Signature Date/Time: 01/08/2021/10:40:18 AM    Final     Lab Data:  CBC: Recent Labs  Lab 01/13/21 0452 01/14/21 0506 01/15/21 0948 01/17/21 0423 01/18/21 0411  WBC 22.1* 22.1* 20.6* 32.2* 31.6*  NEUTROABS  --  12.1* 15.7* 20.0*  --   HGB 16.6* 15.7* 15.2* 15.5* 15.5*  HCT 53.1* 49.7* 48.3* 49.1* 48.6*  MCV 95.2 94.0 92.9 93.5 93.6  PLT 166 145* 141* 159 167   Basic Metabolic Panel: Recent Labs  Lab 01/12/21 0518 01/13/21 0452 01/14/21 0506 01/17/21 0423 01/18/21 0411  NA 139 138 139 135 140  K 3.7 3.4* 4.2 3.9 3.5  CL 98 92* 96* 92* 94*  CO2 32 33* 33* 33* 33*  GLUCOSE 192* 159* 140* 169* 136*  BUN 34* 34* 31* 32* 35*  CREATININE 0.82 0.80 0.65 0.71 0.62  CALCIUM 7.6* 8.0* 8.0* 9.1 9.4  MG  --  2.2 2.2 2.1  --   PHOS  --  3.4 3.3   --   --    GFR: Estimated Creatinine Clearance: 64 mL/min (by C-G formula based on SCr of 0.62 mg/dL). Liver Function Tests: No results for input(s): AST, ALT, ALKPHOS, BILITOT, PROT, ALBUMIN in the last 168 hours. No results for input(s): LIPASE, AMYLASE in the last 168 hours. No results for input(s): AMMONIA in the last 168 hours. Coagulation Profile: No results for input(s): INR, PROTIME in the last 168 hours. Cardiac Enzymes: No results for input(s): CKTOTAL, CKMB, CKMBINDEX, TROPONINI in the last 168 hours. BNP (last 3 results) No results for input(s): PROBNP in the last 8760 hours. HbA1C: No results for input(s): HGBA1C in the last 72 hours. CBG: No results for input(s): GLUCAP in the last 168 hours. Lipid Profile: No results for input(s): CHOL, HDL, LDLCALC, TRIG, CHOLHDL, LDLDIRECT in the last 72 hours. Thyroid Function Tests: No results for input(s): TSH, T4TOTAL, FREET4, T3FREE, THYROIDAB in the last 72 hours. Anemia Panel: No results for input(s): VITAMINB12, FOLATE, FERRITIN, TIBC, IRON, RETICCTPCT in the last 72 hours. Urine analysis:    Component Value Date/Time   COLORURINE AMBER (A) 01/06/2021 0723   APPEARANCEUR CLEAR (A) 01/06/2021 0723   LABSPEC >1.046 (H) 01/06/2021 0723   PHURINE 6.0 01/06/2021 0723   GLUCOSEU NEGATIVE 01/06/2021 1610  HGBUR NEGATIVE 01/06/2021 0723   BILIRUBINUR (A) 01/06/2021 0723    TEST NOT REPORTED DUE TO COLOR INTERFERENCE OF URINE PIGMENT   KETONESUR NEGATIVE 01/06/2021 0723   PROTEINUR NEGATIVE 01/06/2021 0723   NITRITE (A) 01/06/2021 0723    TEST NOT REPORTED DUE TO COLOR INTERFERENCE OF URINE PIGMENT   LEUKOCYTESUR NEGATIVE 01/06/2021 0723     Vale Mousseau M.D. [facility] 01/18/2021, 3:56 PM  Available via Epic secure chat 7am-7pm After 7 pm, please refer to night coverage provider listed on amion.

## 2021-01-18 NOTE — TOC Transition Note (Addendum)
Transition of Care [facility]) - CM/SW Discharge Note   Patient Details  Name: Elizabeth Rowland MRN: [id number] Date of Birth: [birth year]  Transition of Care [facility] CM/SW Contact:  Liliana Cline, LCSW Phone Number: 01/18/2021, 12:58 PM   Clinical Narrative:   Per rounds patient to discharge home with home health today, refusing SNF. CSW notified Barbara Cower with [facility]. Patient needs home o2, PT working on qualifying sat note. Referral made to [facility] Representatives Zach/Rhonda for home o2, they are aware patient will DC today.    1:40- Per secure chart with PT, OT, RN, MD, SNF is recommended and concerns about patient going home with son. CSW left VM for son requesting a return call to discuss patient's care needs/staff concerns. Spoke to patient, she says her son will be home with her all the time and she feels comfortable with this plan. Asked if he can help her with personal care, mobility, etc. as PT says patient will need to be in a wheelchair, patient says "I don't know" and wants CSW to talk to her son.  2:00- Called patient's son [name] again. Explained staff concerns and patient's care needs. Asked if he feels comfortable and physcially capable with patient going home with home health. He says he will have to talk to the patient when he comes to visit in about an hour. He agreed to call CSW as soon as they decide. He said he feels a lot of patient's mobility issues are due to her toenails needing to be cut. Updated care team.    4:00- Attempted call to patient's son to follow up as he has not come to talk with patient yet. Left a VM requesting a return call as soon as possible, informed him we would need to order additional DME if they want patient to go home today and do not want this to delay DC.  4:05- Call from patient's son who stated he is en route to the hospital to talk to patient now, but is hoping he can convince her to go to rehab. He said he has already talked to patient some about  this on the phone. He is to call CSW back after he talks to patient. CSW is starting bed search in case patient does decide to go.  4:40- Call from patient's son and patient. He stated they would like SNF. Per son, patient is not vaccinated for COVID. TOC to follow up tomorrow with bed offers. Updated care team and [facility].   Final next level of care: Home w Home Health Services Barriers to Discharge: Barriers Resolved   Patient Goals and CMS Choice Patient states their goals for this hospitalization and ongoing recovery are:: home with home health CMS Medicare.gov Compare Post Acute Care list provided to:: Patient Choice offered to / list presented to : Adult Children  Discharge Placement                  Name of family member notified: patient aware.    Discharge Plan and Services     Post Acute Care Choice: Home Health          DME Arranged: [facility] DME Agency: [facility] Date DME Agency Contacted: 01/18/21   Representative spoke with at DME Agency: Oletha Cruel, Terrence Dupont HH Arranged: PT,OT [facility] Agency: [facility] Date [facility] Agency Contacted: 01/18/21 Time HH Agency Contacted: [phone] Representative spoke with at [facility] Agency: Feliberto Gottron  Social Determinants of Health (SDOH) Interventions     Readmission  Risk Interventions No flowsheet data found.

## 2021-01-18 NOTE — Evaluation (Signed)
Occupational Therapy Evaluation Patient Details Name: Elizabeth Rowland MRN: [id number] DOB: 1940/10/26 Today's Date: 01/18/2021    History of Present Illness 80 y.o. female with medical history significant for fibromyalgia, history of hypertension, history of hyperlipidemia, currently on tramadol and gabapentin for chronic pain and fibromyalgia, no longer taking antihypertensives for pravastatin, presents to the emergency department for chief concerns of shortness of breath.  Found to have  acute respiratory failure, ARDS. CT angiogram showed multiple PE with extensive groundglass opacities as well as interstitial changes. COVID PCR negative   Clinical Impression   Patient presenting with decreased I in self care, balance, functional mobility/transfers, endurance, and safety awareness. Patient reports being independent without use of AD and living with son PTA. Pt reports she does not drive but son assist her with that is home "almost all the time". No family present to confirm baseline. Pt oriented to self and location. Pt supine in bed and is saturated in urine and unaware. Pt required mod A to get to EOB. Mod A of 2 for standing and pt noted to also be having BM. Pt standing ~ 1 minute for hygiene with posterior bias in standing. Pt returning to bed and gown changed. Pt continues to have BM and placed on bed pan with NT present in room to assist. Pt verbalized wanting to go home and that son could help her. OT asked if family member able to provide this level of assist and she says yes. OT educated pt on if going home other equipment would be needed such as hospital bed and pt refuses. Ultimately, OT recommends short term rehab stay to address functional deficits for safety before returning home. Patient will benefit from acute OT to increase overall independence in the areas of ADLs, functional mobility, and safety awareness in order to safely discharge to next venue of care.    Follow Up  Recommendations  SNF;Supervision/Assistance - 24 hour    Equipment Recommendations  Other (comment) (defer to next venue of care)       Precautions / Restrictions Precautions Precautions: Fall Precaution Comments: per nurse, keep Sp02 above 85% Restrictions Weight Bearing Restrictions: No RLE Weight Bearing: Weight bearing as tolerated LLE Weight Bearing: Weight bearing as tolerated      Mobility Bed Mobility Overal bed mobility: Needs Assistance Bed Mobility: Supine to Sit;Sit to Supine;Rolling Rolling: Mod assist   Supine to sit: Mod assist Sit to supine: Mod assist        Transfers Overall transfer level: Needs assistance Equipment used: Rolling walker (2 wheeled) Transfers: Sit to/from Stand Sit to Stand: +2 physical assistance;+2 safety/equipment;Mod assist         General transfer comment: posterior bias and leaning heavily against bed. Standing for ~ 1 minute    Balance Overall balance assessment: Needs assistance Sitting-balance support: Single extremity supported Sitting balance-Leahy Scale: Fair     Standing balance support: During functional activity;Bilateral upper extremity supported Standing balance-Leahy Scale: Zero                             ADL either performed or assessed with clinical judgement   ADL Overall ADL's : Needs assistance/impaired                                       General ADL Comments: +2 assistance to stand pt for hygiene with  total A needed for hygiene this session.     Vision Patient Visual Report: No change from baseline              Pertinent Vitals/Pain Pain Assessment: 0-10 Pain Score: 2  Pain Location: Ribs Pain Descriptors / Indicators: Discomfort Pain Intervention(s): Monitored during session     Hand Dominance Right   Extremity/Trunk Assessment Upper Extremity Assessment Upper Extremity Assessment: Generalized weakness   Lower Extremity Assessment Lower Extremity  Assessment: Generalized weakness       Communication Communication Communication: No difficulties   Cognition Arousal/Alertness: Awake/alert Behavior During Therapy: Flat affect Overall Cognitive Status: No family/caregiver present to determine baseline cognitive functioning                                 General Comments: Limited insight to deficits. Pt saturated in urine and unaware. Pt having BM in bed but did not alert staff.              Home Living Family/patient expects to be discharged to:: Private residence   Available Help at Discharge: Family   Home Access: Level entry     Home Layout: One level     Bathroom Shower/Tub: Tub/shower unit         Home Equipment: Environmental consultant - 2 wheels   Additional Comments: information is per patient report      Prior Functioning/Environment Level of Independence: Independent        Comments: Pt reports she does all IADL tasks but does not drive        OT Problem List: Decreased strength;Impaired balance (sitting and/or standing);Decreased cognition;Decreased knowledge of precautions;Pain;Decreased safety awareness;Cardiopulmonary status limiting activity;Decreased activity tolerance;Decreased knowledge of use of DME or AE      OT Treatment/Interventions: Self-care/ADL training;DME and/or AE instruction;Therapeutic activities;Balance training;Therapeutic exercise;Neuromuscular education;Visual/perceptual remediation/compensation;Energy conservation;Patient/family education    OT Goals(Current goals can be found in the care plan section) Acute Rehab OT Goals Patient Stated Goal: to go home OT Goal Formulation: With patient Time For Goal Achievement: 02/01/21 Potential to Achieve Goals: Good  OT Frequency: Min 2X/week   Barriers to D/C:    none known at this time - unsure if caregiver can assist with this level of care          AM-PAC OT "6 Clicks" Daily Activity     Outcome Measure Help from  another person eating meals?: A Little Help from another person taking care of personal grooming?: A Little Help from another person toileting, which includes using toliet, bedpan, or urinal?: Total Help from another person bathing (including washing, rinsing, drying)?: A Lot Help from another person to put on and taking off regular upper body clothing?: A Little Help from another person to put on and taking off regular lower body clothing?: Total 6 Click Score: 13   End of Session Equipment Utilized During Treatment: Rolling walker Nurse Communication: Other (comment) (on bed pan)  Activity Tolerance:   Patient left: in bed;with call bell/phone within reach;with bed alarm set  OT Visit Diagnosis: Unsteadiness on feet (R26.81);Muscle weakness (generalized) (M62.81)                Time: 3016-0109 OT Time Calculation (min): 20 min Charges:  OT General Charges $OT Visit: 1 Visit OT Evaluation $OT Eval Moderate Complexity: 1 Mod OT Treatments $Self Care/Home Management : 8-22 mins  Jackquline Denmark, MS, OTR/L , CBIS ascom [phone]  01/18/21,  11:48 AM

## 2021-01-18 NOTE — Progress Notes (Signed)
Name: Elizabeth Rowland MRN: [id number] DOB: May 04, 1941     CONSULTATION DATE: 01/06/2021  REFERRING MD :  Nelson Chimes  CHIEF COMPLAINT:  Shortness of breath    ASSESSMENT AND PLAN   RESPIRATORY:  Acute hypoxemic respiratory failure- improved - O2 therapy is down from 6 to 4L/min - 01/17/21 - >>>2l/min 01/18/21         Presumably post covid pneumonitis-changed solumedrol to pred 50 - 01/16/21 - weaning to prednisone 40mg  - 01/18/20 >>decreased to 20mg  01/18/21         Unlikely PJP pneumonia -pending fungitell       There is pulmonary edema - continue with lasix IV- 40 lasix daily -net >8L negative with preserved renal function           PT OT for atelectasis-patient is improved continue please - 01/16/21        CT with GGO and interstitial thickening, small PE noted on mediastinal cuts  PE not hemodynamic, not causing RV strain, given interstitial disease is not the cause of hypoxia  CV:     ECHO  1. Left ventricular ejection fraction, by estimation, is 55 to 60%. The  left ventricle has normal function. Left ventricular endocardial border  not optimally defined to evaluate regional wall motion. Left ventricular  diastolic parameters are consistent  with Grade I diastolic dysfunction (impaired relaxation).  2. RV systolic function and size are normal. PA pressure.  3. Left atrial size was mildly dilated.  4. Right atrial size was mildly dilated.  5. The mitral valve is normal in structure. No evidence of mitral valve  regurgitation. No evidence of mitral stenosis.  6. The aortic valve is normal in structure. Aortic valve regurgitation is  not visualized. No aortic stenosis is present.   EF preserved, no evidence for overt failure although BNP was 2 fold elevated  Trend and see now that the patient had been diuresed somewhat  ID As above  SUBJECTIVE   HPI: 80 yo WF presented to the ED via EMS for shortness of breath and field saturation in the 40s. She can provide no  history at the moment, but her son is at the bedside and the original record is used for this description. The patient has been at home except to see her pain clinic. Her son reports only a few persons visit. He cannot recall any recent (within the month or so) of cough or other prodromal symptoms. The patient was never vaccinated and does not suffer from diabetes, but she is obese. Her COVID screen is negative x2 and a full respiratory Biofire was also negative for a range of viral infections. Despite this a CT was noted to have significant GGO and interstitial infiltrates. Her BNP was two fold elevated, but the pattern does not seem of overt volume overload and an echo revealed preserved LV function. Her procalcitonin is low for pneumonia, however she did have a leukocytosis.   PAST MEDICAL HISTORY :  HTN Fibromyalgia HLD Chronic Pain  Prior to Admission medications   Medication Sig Start Date End Date Taking? Authorizing Provider  Acetaminophen 500 MG capsule Take 500 mg by mouth every 4 (four) hours as needed.   Yes [provider]  gabapentin (NEURONTIN) 300 MG capsule Take 1 capsule by mouth 3 (three) times daily. 11/24/20  Yes [provider]  pravastatin (PRAVACHOL) 10 MG tablet Take 10 mg by mouth daily. 03/26/14  Yes [provider]  traMADol (ULTRAM) 50 MG tablet Take 50  mg by mouth 3 (three) times daily as needed. 11/24/20  Yes [provider]   FAMILY HISTORY:  DM, HTN  SOCIAL HISTORY:  reports that she has never smoked. She has never used smokeless tobacco. She reports that she does not drink alcohol and does not use drugs.  REVIEW OF SYSTEMS:   Unable to obtain due to shortness of breath and mental status  Estimated body mass index is 33.75 kg/m as calculated from the following:   Height as of this encounter: 5\' 6"  (1.676 m).   Weight as of this encounter: 94.8 kg.   OBJECTIVE    VITAL SIGNS: Temp:  [97.7 F (36.5 C)-98.6 F (37 C)]  98.6 F (37 C) (05/05 1152) Pulse Rate:  [84-103] 98 (05/05 1152) Resp:  [12-18] 18 (05/05 1152) BP: (132-142)/(62-77) 142/74 (05/05 1152) SpO2:  [89 %-98 %] 89 % (05/05 1152) Weight:  [94.8 kg-95.2 kg] 94.8 kg (05/05 0900)   I/O last 3 completed shifts: In: 960 [P.O.:960] Out: 1900 [Urine:1900] Total I/O In: 240 [P.O.:240] Out: -    SpO2: (!) 89 % O2 Flow Rate (L/min): 4 L/min FiO2 (%): 39.5 %   Physical Examination:  GENERAL:critically ill appearing, not in overt resp distress HEAD: Normocephalic, atraumatic.  EYES: Pupils equal, round, reactive to light.  No scleral icterus.  MOUTH: Moist mucosal membrane. NECK: Supple. No JVD.  PULMONARY: rhonchi CARDIOVASCULAR:RRR, no murmur  GASTROINTESTINAL: Soft, nontender, -distended.  Positive bowel sounds.  MUSCULOSKELETAL: trace edema.  NEUROLOGIC: opens eyes, tracks, non-communicating.  SKIN:intact,warm,dry   MEDICATIONS: I have reviewed all medications and confirmed regimen as documented   CULTURES: Recent Results (from the past 240 hour(s))  MRSA PCR Screening     Status: None   Collection Time: 01/09/21 12:41 PM   Specimen: Nasopharyngeal  Result Value Ref Range Status   MRSA by PCR NEGATIVE NEGATIVE Final    Comment:        The GeneXpert MRSA Assay (FDA approved for NASAL specimens only), is one component of a comprehensive MRSA colonization surveillance program. It is not intended to diagnose MRSA infection nor to guide or monitor treatment for MRSA infections. Performed at [facility], [address], [place], [place]           [id number], M.D.  Pulmonary & Critical Care Medicine  [facility]

## 2021-01-18 NOTE — NC FL2 (Signed)
[place] MEDICAID FL2 LEVEL OF CARE SCREENING TOOL     IDENTIFICATION  Patient Name: Elizabeth Rowland Birthdate: 1940/12/09 Sex: female Admission Date (Current Location): 01/06/2021  [place] Number:  Chiropodist and Address:  [facility], [address] [place], [place] [address]      Provider Number: [id number]  Attending Physician Name and Address:  Cathren Harsh, MD  Relative Name and Phone Number:  [name], [name] (FTD)   [phone] [facility] Phone)    Current Level of Care: Hospital Recommended Level of Care: Skilled Nursing Facility Prior Approval Number:    Date Approved/Denied:   PASRR Number: [id number]  Discharge Plan:      Current Diagnoses: Patient Active Problem List   Diagnosis Date Noted  . Community acquired pneumonia   . Lung mass   . Acute respiratory failure with hypoxia (HCC) 01/07/2021  . Acute respiratory distress syndrome (ARDS) due to COVID-19 virus (HCC) 01/07/2021  . Pulmonary emboli (HCC) 01/07/2021  . Severe sepsis with acute organ dysfunction (HCC) 01/06/2021  . Hyperlipidemia 01/06/2021  . Fibromyalgia 01/06/2021  . Essential hypertension 01/06/2021    Orientation RESPIRATION BLADDER Height & Weight     Self,Time,Situation,Place  O2 External catheter,Incontinent Weight: 209 lb 1.6 oz (94.8 kg) Height:  5\' 6"  (167.6 cm)  BEHAVIORAL SYMPTOMS/MOOD NEUROLOGICAL BOWEL NUTRITION STATUS      Incontinent Diet (heart diet; thin liquids)  AMBULATORY STATUS COMMUNICATION OF NEEDS Skin   Extensive Assist Verbally Other (Comment) (dry; flaky; cracking)                       Personal Care Assistance Level of Assistance  Bathing,Feeding,Dressing Bathing Assistance: Maximum assistance Feeding assistance: Limited assistance Dressing Assistance: Maximum assistance     Functional Limitations Info             SPECIAL CARE FACTORS FREQUENCY  PT (By licensed PT),OT (By licensed OT)      PT Frequency: 5 x/week OT Frequency: 5 x/week            Contractures      Additional Factors Info  Code Status,Allergies Code Status Info: full Allergies Info: not on file           Current Medications (01/18/2021):  This is the current hospital active medication list Current Facility-Administered Medications  Medication Dose Route Frequency Provider Last Rate Last Admin  . 0.9 %  sodium chloride infusion   Intravenous PRN 03/20/2021, MD 10 mL/hr at 01/13/21 1302 250 mL at 01/13/21 1302  . acetaminophen (TYLENOL) tablet 650 mg  650 mg Oral Q6H PRN Cox, Amy N, DO   650 mg at 01/17/21 2008   Or  . acetaminophen (TYLENOL) suppository 650 mg  650 mg Rectal Q6H PRN Cox, Amy N, DO      . apixaban (ELIQUIS) tablet 5 mg  5 mg Oral BID 2009, RPH   5 mg at 01/18/21 0933  . feeding supplement (ENSURE ENLIVE / ENSURE PLUS) liquid 237 mL  237 mL Oral QHS 03/20/21, MD      . Lurene Shadow ON 01/19/2021] furosemide (LASIX) tablet 40 mg  40 mg Oral Daily Rai, Ripudeep K, MD      . gabapentin (NEURONTIN) capsule 100 mg  100 mg Oral TID 03/21/2021, MD   100 mg at 01/18/21 0933  . hydrALAZINE (APRESOLINE) tablet 25 mg  25 mg Oral Q8H PRN 03/20/21, MD      .  multivitamin with minerals tablet 1 tablet  1 tablet Oral Daily Arnetha Courser, MD   1 tablet at 01/18/21 0934  . nystatin (MYCOSTATIN) 100000 UNIT/ML suspension 500,000 Units  5 mL Oral QID Lurene Shadow, MD   500,000 Units at 01/17/21 1337  . ondansetron (ZOFRAN) tablet 4 mg  4 mg Oral Q6H PRN Cox, Amy N, DO       Or  . ondansetron (ZOFRAN) injection 4 mg  4 mg Intravenous Q6H PRN Cox, Amy N, DO      . pravastatin (PRAVACHOL) tablet 10 mg  10 mg Oral Daily Cox, Amy N, DO   10 mg at 01/18/21 0933  . predniSONE (DELTASONE) tablet 40 mg  40 mg Oral Q breakfast Vida Rigger, MD   40 mg at 01/18/21 0933  . senna (SENOKOT) tablet 8.6 mg  1 tablet Oral QHS Lurene Shadow, MD   8.6 mg at 01/17/21 2008     Discharge  Medications: Please see discharge summary for a list of discharge medications.  Relevant Imaging Results:  Relevant Lab Results:   Additional Information SS #: [id number]  Elizabeth Ledesma E [address], LCSW

## 2021-01-19 LAB — BASIC METABOLIC PANEL
Anion gap: 11 (ref 5–15)
BUN: 34 mg/dL — ABNORMAL HIGH (ref 8–23)
CO2: 33 mmol/L — ABNORMAL HIGH (ref 22–32)
Calcium: 8.9 mg/dL (ref 8.9–10.3)
Chloride: 93 mmol/L — ABNORMAL LOW (ref 98–111)
Creatinine, Ser: 0.81 mg/dL (ref 0.44–1.00)
GFR, Estimated: >60 mL/min (ref >60)
Glucose, Bld: 131 mg/dL — ABNORMAL HIGH (ref 70–99)
Potassium: 3.7 mmol/L (ref 3.5–5.1)
Sodium: 137 mmol/L (ref 135–145)

## 2021-01-19 LAB — CBC
HCT: 49.3 % — ABNORMAL HIGH (ref 36.0–46.0)
Hemoglobin: 15.7 g/dL — ABNORMAL HIGH (ref 12.0–15.0)
MCH: 29.6 pg (ref 26.0–34.0)
MCHC: 31.8 g/dL (ref 30.0–36.0)
MCV: 93 fL (ref 80.0–100.0)
Platelets: 172 10*3/uL (ref 150–400)
RBC: 5.3 MIL/uL — ABNORMAL HIGH (ref 3.87–5.11)
RDW: 15.9 % — ABNORMAL HIGH (ref 11.5–15.5)
WBC: 32 10*3/uL — ABNORMAL HIGH (ref 4.0–10.5)
nRBC: 0 % (ref 0.0–0.2)

## 2021-01-19 MED ORDER — TRAMADOL HCL 50 MG PO TABS
100.0000 mg | ORAL_TABLET | Freq: Three times a day (TID) | ORAL | Status: DC
Start: 2021-01-19 — End: 2021-01-23
  Administered 2021-01-19 – 2021-01-23 (×12): 100 mg via ORAL
  Filled 2021-01-19 (×12): qty 2

## 2021-01-19 NOTE — Progress Notes (Signed)
[facility]                                                                              Patient Demographics  Elizabeth Rowland, is a 80 y.o. female, DOB - [birth year], WUJ:[id number]  Admit date - 01/06/2021   Admitting Physician Marrion Coy, MD  Outpatient Primary MD for the patient is Leanna Sato, MD  Outpatient specialists:   LOS - 12  days   Medical records reviewed and are as summarized below:    Chief Complaint  Patient presents with  . Shortness of Breath    Loss of taiste x 2 weeks, sob, pt from home        Brief summary    Patient is a 80 year old female with history of hypertension, hyperlipidemia, fibromyalgia presented to the hospital for shortness of breath, was hypoxic in ED and requiring 10 L O2.  Patient was found to have severe sepsis secondary to pneumonia, acute pulmonary embolism and acute hypoxic respiratory failure.  She also had ARDS, test for COVID-19 were negative.  Patient was treated with empiric IV antibiotics, IV heparin, IV steroids and IV Lasix.  Subsequently transitioned to oral Eliquis for long-term anticoagulation.  Currently off of HFNC however still on 6 L O2 via [place], weaning down. Pulmonology consulted.  She has a thyroid nodule and outpatient follow-up is recommended for further evaluation.  She also had a right mid to lower lobe mass-like opacity on chest x-ray but this was not evident on CT chest.  Outpatient follow-up is recommended for this as well.  She was evaluated by PT and OT recommended discharge to SNF  Assessment & Plan     Principal problem  Severe sepsis with acute organ dysfunction (HCC), acute respiratory failure with hypoxia, ARDS -Improving, she has been tapered off of HFNC, now sats 95% on 2 L -Completed 8 days of IV antibiotics on 01/13/2021. -Completed 3-day course of high-dose steroids on 4/27, was resumed on low-dose IV steroids on 5/1.  Now transitioned to oral prednisone, will need prednisone  taper at the time of discharge. 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 -2D echo showed EF of 55 to 60% with grade 1 diastolic dysfunction, no overt heart failure -Patient was placed on IV Lasix, transitioned to oral lasix 40 mg daily  -BNP improved 45, Cr stable      Hyperlipidemia -Continue pravastatin  Leukocytosis -Likely reactive or related to steroids, no fevers  Bilateral pulmonary embolism, right peroneal DVT -Continue eliquis, monitor CBC  Oral thrush -Continue nystatin  Demand ischemia with elevated troponin likely due to severe sepsis, hypoxia, mild volume overload -Currently no chest pain or any worsening cardiac symptoms  Right mid to lower lung mass like opacity on chest x-ray -Incidently seen on chest x-ray but not seen on CT chest.  Outpatient follow-up recommended  Thyroid nodule -Outpatient follow-up recommended  Chronic pain/fibromyalgia Continue tramadol  TID PRN per home regimen     Mild protein calorie malnutrition due to inadequate oral intake, acute  on -Dietitian following, continue nutritional supplements  Obesity Estimated body mass index is 33.8 kg/m as calculated from the following:   Height as of this encounter:  (1.676 m).   Weight as of this encounter: 95 kg.   Goals of care, generalized debility -Discussed with patient's son at the bedside, recommended SNF as patient is still debilitated.  Patient however wants to go home and his son okay with patient's wishes. PT also feels patient is too debilitated and if son would be able to assist with ADLs.  If he is not, then will recommend SNF.  Code Status: Full DVT Prophylaxis:  Place TED hose Start: 01/06/21 1523 apixaban (ELIQUIS) tablet 5 mg   Level of Care: Level of care: Progressive Cardiac Family Communication: Discussed all  imaging results, lab results, explained to the patient and son at the bedside   Disposition Plan:     Status is: Inpatient  Remains inpatient appropriate because:Inpatient level of care appropriate due to severity of illness   Dispo: The patient is from: Home              Anticipated d/c is to: TBD              Patient currently is not medically stable to d/c.     Difficult to place patient No      Time Spent in minutes 35 minutes  Procedures:  None  Consultants:   Pulmonology Palliative medicine  Antimicrobials:   Anti-infectives (From admission, onward)   Start     Dose/Rate Route Frequency Ordered Stop   01/09/21 1230  ceFEPIme (MAXIPIME) 2 g in sodium chloride 0.9 % 100 mL IVPB  Status:  Discontinued        2 g 200 mL/hr over 30 Minutes Intravenous Every 8 hours 01/09/21 1201 01/13/21 1429   01/07/21 1500  cefTRIAXone (ROCEPHIN) 2 g in sodium chloride 0.9 % 100 mL IVPB  Status:  Discontinued        2 g 200 mL/hr over 30 Minutes Intravenous Every 24 hours 01/06/21 1524 01/07/21 1207   01/07/21 1500  azithromycin (ZITHROMAX) 500 mg in sodium chloride 0.9 % 250 mL IVPB        500 mg 250 mL/hr over 60 Minutes Intravenous Every 24 hours 01/06/21 1524 01/12/21 1524   01/06/21 1515  cefTRIAXone (ROCEPHIN) 2 g in sodium chloride 0.9 % 100 mL IVPB        2 g 200 mL/hr over 30 Minutes Intravenous  Once 01/06/21 1506 01/06/21 1546   01/06/21 1515  azithromycin (ZITHROMAX) 500 mg in sodium chloride 0.9 % 250 mL IVPB        500 mg 250 mL/hr over 60 Minutes Intravenous  Once 01/06/21 1506 01/06/21 1645         Medications  Scheduled Meds: . apixaban  5 mg Oral BID  . feeding supplement  237 mL Oral QHS  . furosemide  40 mg Oral Daily  . gabapentin  100 mg Oral TID  . multivitamin with minerals  1 tablet Oral Daily  . nystatin  5 mL Oral QID  . pravastatin  10 mg Oral Daily  . predniSONE  40 mg Oral Q breakfast  . senna  1 tablet Oral QHS  . traMADol  100 mg Oral  TID   Continuous Infusions: . sodium chloride 250 mL (01/13/21 1302)   PRN Meds:.sodium chloride, acetaminophen **OR** acetaminophen, hydrALAZINE, ondansetron **OR** ondansetron (ZOFRAN) IV      Subjective:  [facility] was seen and examined today. Waiting for SNF,  Patient denies dizziness, chest pain,  abdominal pain, N/V/. + Generalized weakness.  No acute events overnight.    Objective:   Vitals:   01/18/21 1935 01/19/21 0301 01/19/21 0517 01/19/21 1147  BP: 140/67 (!) 141/63  127/67  Pulse: (!) 107 97  94  Resp: Temp: 98.3 F (36.8 C)   97.6 F (36.4 C)  TempSrc: Oral     SpO2: 94% 95%  95%  Weight:   95 kg   Height:        Intake/Output Summary (Last 24 hours) at 01/19/2021 1459 Last data filed at 01/19/2021 1340 Gross per 24 hour  Intake 1320 ml  Output 400 ml  Net 920 ml     Wt Readings from Last 3 Encounters:  01/19/21 95 kg   Physical Exam  General: Alert and oriented x 3, NAD, frail ill-appearing  Cardiovascular: S1 S2 clear, RRR. No pedal edema b/l  Respiratory: Fairly CTA B  Gastrointestinal: Soft, nontender, nondistended, NBS  Ext: no pedal edema bilaterally  Neuro: no new deficits  Musculoskeletal: No cyanosis, clubbing  Skin: Chronic lesions on bilateral legs  Psych: Normal affect and demeanor, alert and oriented x3     Data Reviewed:  I have personally reviewed following labs and imaging studies  Micro Results No results found for this or any previous visit (from the past 240 hour(s)).  Radiology Reports CT Angio Chest PE W and/or Wo Contrast  Addendum Date: 01/06/2021   ADDENDUM REPORT: 01/06/2021 16:38 ADDENDUM: Of note, in the CTA CHEST FINDINGS section, under the Cardiovascular subsection, the original report incorrectly states that there is no pericardial effusion. This section should read that there is a small pericardial effusion. The right report is otherwise unchanged. Electronically Signed   By: Romona Curls M.D.   On: 01/06/2021 16:38   Result Date: 01/06/2021 CLINICAL DATA:  Increased shortness of breath for 2 weeks, concern for pulmonary embolism and metastatic disease. EXAM: CT ANGIOGRAPHY CHEST CT ABDOMEN AND PELVIS WITH CONTRAST TECHNIQUE: Multidetector CT imaging of the chest was performed using the standard protocol during bolus administration of intravenous contrast. Multiplanar CT image reconstructions and MIPs were obtained to evaluate the vascular anatomy. Multidetector CT imaging of the abdomen and pelvis was performed using the standard protocol during bolus administration of intravenous contrast. CONTRAST:  75mL OMNIPAQUE IOHEXOL 350 MG/ML SOLN COMPARISON:  Same day chest radiograph and CT abdomen dated 11/20/2007. FINDINGS: CTA CHEST FINDINGS Cardiovascular: Satisfactory opacification of the pulmonary arteries to the segmental level. Multiple filling defects in segmental and subsegmental pulmonary arteries supplying the right upper lobe and left upper lobe are consistent with pulmonary emboli. The main pulmonary artery is enlarged, measuring 3.3 cm in diameter, suggestive of pulmonary hypertension. Vascular calcifications are seen in the coronary arteries and aortic arch. Normal heart size. No pericardial effusion. Mediastinum/Nodes: Bilateral hilar and mediastinal adenopathy is noted. For reference, a right paratracheal lymph node measures 1.1 cm short axis (series 5, image 26). No enlarged axillary lymph nodes. A left thyroid nodule measures 2.9 x 1.5 cm. The trachea and esophagus demonstrate no significant findings. Lungs/Pleura: Severe diffuse bilateral ground-glass opacities and septal line thickening is seen. There is a small left and trace right pleural effusion with associated atelectasis. There is no pneumothorax. Musculoskeletal: No chest wall abnormality. No acute or significant osseous findings. Review of the MIP images confirms the above findings. CT ABDOMEN and PELVIS FINDINGS  Hepatobiliary: An 8 mm cyst is seen in the right hepatic lobe. No gallstones, gallbladder wall thickening, or biliary dilatation. Pancreas: Unremarkable. No pancreatic ductal dilatation or surrounding inflammatory changes. Spleen: Normal in size without focal abnormality. Adrenals/Urinary Tract: Adrenal glands are unremarkable. Other than a 2 cm cyst in the left kidney, the kidneys are normal, without renal calculi, focal lesion, or hydronephrosis. Bladder is unremarkable. Stomach/Bowel: There is a small hiatal hernia. Appendix appears normal. No evidence of bowel wall thickening, distention, or inflammatory changes. Vascular/Lymphatic: Aortic atherosclerosis. No enlarged abdominal or pelvic lymph nodes. Reproductive: Uterus and bilateral adnexa are unremarkable. Other: There is a fat containing ventral abdominal wall hernia near the umbilicus. No free intraperitoneal fluid. Musculoskeletal: Degenerative changes are seen in the spine. Review of the MIP images confirms the above findings. IMPRESSION: 1. Multiple filling defects in segmental and subsegmental pulmonary arteries supplying the right upper lobe and left upper lobe, consistent with pulmonary emboli. 2. Severe diffuse bilateral ground-glass opacities and septal line thickening likely represent pulmonary edema/pneumonia. Small left and trace right pleural effusions with associated atelectasis. The previously questioned masslike opacity overlying the right mid and lower lung likely reflected the right atrial silhouette. 3. Bilateral hilar and mediastinal adenopathy is likely reactive. 4. Left thyroid nodule measures 2.9 x 1.5 cm. Recommend thyroid US (ref: J Am Coll Radiol. 2015 Feb;12(2): 143-50). 5. No acute process in the abdomen or pelvis. Aortic Atherosclerosis (ICD10-I70.0). These results were called by telephone at the time of interpretation on 01/06/2021 at 4:10 pm to provider Dr. Dionne Bucy, who verbally acknowledged these results.  Electronically Signed: By: Romona Curls M.D. On: 01/06/2021 16:11   CT ABDOMEN PELVIS W CONTRAST  Addendum Date: 01/06/2021   ADDENDUM REPORT: 01/06/2021 16:38 ADDENDUM: Of note, in the CTA CHEST FINDINGS section, under the Cardiovascular subsection, the original report incorrectly states that there is no pericardial effusion. This section should read that there is a small pericardial effusion. The right report is otherwise unchanged. Electronically Signed   By: Romona Curls M.D.   On: 01/06/2021 16:38   Result Date: 01/06/2021 CLINICAL DATA:  Increased shortness of breath for 2 weeks, concern for pulmonary embolism and metastatic disease. EXAM: CT ANGIOGRAPHY CHEST CT ABDOMEN AND PELVIS WITH CONTRAST TECHNIQUE: Multidetector CT imaging of the chest was performed using the standard protocol during bolus administration of intravenous contrast. Multiplanar CT image reconstructions and MIPs were obtained to evaluate the vascular anatomy. Multidetector CT imaging of the abdomen and pelvis was performed using the standard protocol during bolus administration of intravenous contrast. CONTRAST:  4mL OMNIPAQUE IOHEXOL 350 MG/ML SOLN COMPARISON:  Same day chest radiograph and CT abdomen dated 11/20/2007. FINDINGS: CTA CHEST FINDINGS Cardiovascular: Satisfactory opacification of the pulmonary arteries to the segmental level. Multiple filling defects in segmental and subsegmental pulmonary arteries supplying the right upper lobe and left upper lobe are consistent with pulmonary emboli. The main pulmonary artery is enlarged, measuring 3.3 cm in diameter, suggestive of pulmonary hypertension. Vascular calcifications are seen in the coronary arteries and aortic arch. Normal heart size. No pericardial effusion. Mediastinum/Nodes: Bilateral hilar and mediastinal adenopathy is noted. For reference, a right paratracheal lymph node measures 1.1 cm short axis (series 5, image 26). No enlarged axillary lymph nodes. A left  thyroid nodule measures 2.9 x 1.5 cm. The trachea and esophagus demonstrate no significant findings. Lungs/Pleura: Severe diffuse bilateral ground-glass opacities and septal line thickening is seen. There is a small left and trace right pleural effusion with associated atelectasis. There  is no pneumothorax. Musculoskeletal: No chest wall abnormality. No acute or significant osseous findings. Review of the MIP images confirms the above findings. CT ABDOMEN and PELVIS FINDINGS Hepatobiliary: An 8 mm cyst is seen in the right hepatic lobe. No gallstones, gallbladder wall thickening, or biliary dilatation. Pancreas: Unremarkable. No pancreatic ductal dilatation or surrounding inflammatory changes. Spleen: Normal in size without focal abnormality. Adrenals/Urinary Tract: Adrenal glands are unremarkable. Other than a 2 cm cyst in the left kidney, the kidneys are normal, without renal calculi, focal lesion, or hydronephrosis. Bladder is unremarkable. Stomach/Bowel: There is a small hiatal hernia. Appendix appears normal. No evidence of bowel wall thickening, distention, or inflammatory changes. Vascular/Lymphatic: Aortic atherosclerosis. No enlarged abdominal or pelvic lymph nodes. Reproductive: Uterus and bilateral adnexa are unremarkable. Other: There is a fat containing ventral abdominal wall hernia near the umbilicus. No free intraperitoneal fluid. Musculoskeletal: Degenerative changes are seen in the spine. Review of the MIP images confirms the above findings. IMPRESSION: 1. Multiple filling defects in segmental and subsegmental pulmonary arteries supplying the right upper lobe and left upper lobe, consistent with pulmonary emboli. 2. Severe diffuse bilateral ground-glass opacities and septal line thickening likely represent pulmonary edema/pneumonia. Small left and trace right pleural effusions with associated atelectasis. The previously questioned masslike opacity overlying the right mid and lower lung likely  reflected the right atrial silhouette. 3. Bilateral hilar and mediastinal adenopathy is likely reactive. 4. Left thyroid nodule measures 2.9 x 1.5 cm. Recommend thyroid US (ref: J Am Coll Radiol. 2015 Feb;12(2): 143-50). 5. No acute process in the abdomen or pelvis. Aortic Atherosclerosis (ICD10-I70.0). These results were called by telephone at the time of interpretation on 01/06/2021 at 4:10 pm to provider Dr. Dionne Bucy, who verbally acknowledged these results. Electronically Signed: By: Romona Curls M.D. On: 01/06/2021 16:11   US Venous Img Lower Bilateral (DVT)  Result Date: 01/07/2021 CLINICAL DATA:  Pulmonary embolism. EXAM: BILATERAL LOWER EXTREMITY VENOUS DOPPLER ULTRASOUND TECHNIQUE: Gray-scale sonography with compression, as well as color and duplex ultrasound, were performed to evaluate the deep venous system(s) from the level of the common femoral vein through the popliteal and proximal calf veins. COMPARISON:  None. FINDINGS: VENOUS On the right, hypoechoic thrombus in a noncompressible peroneal vein. Normal compressibility of the common femoral, superficial femoral, and popliteal veins. Visualized portions of profunda femoral vein and great saphenous vein unremarkable. No filling defects to suggest DVT on grayscale or color Doppler imaging. On the left, Normal compressibility of the common femoral, superficial femoral, and popliteal veins, as well as the visualized calf veins. Visualized portions of profunda femoral vein and great saphenous vein unremarkable. No filling defects to suggest DVT on grayscale or color Doppler imaging. Doppler waveforms show normal direction of venous flow, normal respiratory phasicity and response to augmentation. OTHER None. Limitations: none IMPRESSION: 1. POSITIVE for isolated right peroneal (calf) DVT. 2. Negative for left lower extremity DVT. Electronically Signed   By: Corlis Leak M.D.   On: 01/07/2021 08:03   DG CHEST PORT 1 VIEW  Result Date:  01/08/2021 CLINICAL DATA:  Increased shortness of breath. EXAM: PORTABLE CHEST 1 VIEW COMPARISON:  01/06/2021 chest radiograph and CT chest. FINDINGS: Patient is rotated. Heart is enlarged, stable. Similar to minimally increased diffuse mixed interstitial and airspace opacification. There may be a small right pleural effusion. IMPRESSION: 1. Similar to minimally increased diffuse mixed interstitial and airspace opacification. Findings may be due to edema or viral/atypical pneumonia, including due to COVID-19. 2. Probable small right pleural effusion.  Electronically Signed   By: Leanna BattlesMelinda  Blietz M.D.   On: 01/08/2021 08:54   DG Chest Portable 1 View  Result Date: 01/06/2021 CLINICAL DATA:  Shortness of breath EXAM: PORTABLE CHEST 1 VIEW COMPARISON:  07/06/2014 FINDINGS: Heart size appears within normal limits. Rounded mass-like opacity within the right mid to lower lung field measuring approximately 6.4 cm in diameter. Diffuse interstitial opacities throughout both lungs. Small right pleural effusion. No pneumothorax. IMPRESSION: 1. Rounded mass-like opacity within the right mid to lower lung field measuring approximately 6.4 cm in diameter. Further evaluation with contrast-enhanced CT of the chest is recommended. 2. Extensive bilateral interstitial opacities which may reflect pulmonary edema or possibly atypical/viral infection. 3. Small right pleural effusion. Electronically Signed   By: Duanne GuessNicholas  Plundo D.O.   On: 01/06/2021 14:12   ECHOCARDIOGRAM COMPLETE  Result Date: 01/08/2021    ECHOCARDIOGRAM REPORT   Patient Name:   Elizabeth Rowland Date of Exam: 01/08/2021 Medical Rec #:  [id number]           Height:       66.0 in Accession #:    [id number]          Weight:       219.4 lb Date of Birth:  30-Mar-1941          BSA:          2.080 m Patient Age:    79 years            BP:           158/58 mmHg Patient Gender: F                   HR:           92 bpm. Exam Location:  [facility] Procedure: 2D Echo, Cardiac  Doppler and Color Doppler Indications:     Pulmonary embolus I26.09  History:         Patient has no prior history of Echocardiogram examinations. No                  medical history on file.  Sonographer:     Cristela BlueJerry Hege RDCS (AE) Referring Phys:  [id number] AMY N [facility] Diagnosing Phys: Lorine BearsMuhammad Arida MD  Sonographer Comments: Technically challenging study due to limited acoustic windows and suboptimal apical window. IMPRESSIONS  1. Left ventricular ejection fraction, by estimation, is 55 to 60%. The left ventricle has normal function. Left ventricular endocardial border not optimally defined to evaluate regional wall motion. Left ventricular diastolic parameters are consistent with Grade I diastolic dysfunction (impaired relaxation).  2. Right ventricular systolic function is normal. The right ventricular size is normal. Tricuspid regurgitation signal is inadequate for assessing PA pressure.  3. Left atrial size was mildly dilated.  4. Right atrial size was mildly dilated.  5. The mitral valve is normal in structure. No evidence of mitral valve regurgitation. No evidence of mitral stenosis.  6. The aortic valve is normal in structure. Aortic valve regurgitation is not visualized. No aortic stenosis is present. FINDINGS  Left Ventricle: Left ventricular ejection fraction, by estimation, is 55 to 60%. The left ventricle has normal function. Left ventricular endocardial border not optimally defined to evaluate regional wall motion. The left ventricular internal cavity size was normal in size. There is no left ventricular hypertrophy. Left ventricular diastolic parameters are consistent with Grade I diastolic dysfunction (impaired relaxation). Right Ventricle: The right ventricular size is normal. No increase in right ventricular wall thickness. Right  ventricular systolic function is normal. Tricuspid regurgitation signal is inadequate for assessing PA pressure. Left Atrium: Left atrial size was mildly dilated. Right  Atrium: Right atrial size was mildly dilated. Pericardium: There is no evidence of pericardial effusion. Mitral Valve: The mitral valve is normal in structure. No evidence of mitral valve regurgitation. No evidence of mitral valve stenosis. Tricuspid Valve: The tricuspid valve is normal in structure. Tricuspid valve regurgitation is not demonstrated. No evidence of tricuspid stenosis. Aortic Valve: The aortic valve is normal in structure. Aortic valve regurgitation is not visualized. No aortic stenosis is present. Aortic valve mean gradient measures 3.0 mmHg. Aortic valve peak gradient measures 5.9 mmHg. Aortic valve area, by VTI measures 3.45 cm. Pulmonic Valve: The pulmonic valve was normal in structure. Pulmonic valve regurgitation is not visualized. No evidence of pulmonic stenosis. Aorta: The aortic root is normal in size and structure. Venous: The inferior vena cava was not well visualized. IAS/Shunts: No atrial level shunt detected by color flow Doppler.  LEFT VENTRICLE PLAX 2D LVIDd:         3.36 cm  Diastology LVIDs:         2.49 cm  LV e' medial:    4.46 cm/s LV PW:         1.08 cm  LV E/e' medial:  20.6 LV IVS:        0.95 cm  LV e' lateral:   6.74 cm/s LVOT diam:     2.00 cm  LV E/e' lateral: 13.6 LV SV:         78 LV SV Index:   37 LVOT Area:     3.14 cm  RIGHT VENTRICLE RV Basal diam:  3.98 cm RV S prime:     12.40 cm/s LEFT ATRIUM             Index       RIGHT ATRIUM           Index LA diam:        4.00 cm 1.92 cm/m  RA Area:     23.30 cm LA Vol (A2C):   54.5 ml 26.20 ml/m RA Volume:   78.60 ml  37.79 ml/m LA Vol (A4C):   56.6 ml 27.21 ml/m LA Biplane Vol: 55.3 ml 26.59 ml/m  AORTIC VALVE                   PULMONIC VALVE AV Area (Vmax):    2.78 cm    PV Vmax:        0.60 m/s AV Area (Vmean):   2.71 cm    PV Peak grad:   1.4 mmHg AV Area (VTI):     3.45 cm    RVOT Peak grad: 4 mmHg AV Vmax:           121.00 cm/s AV Vmean:          86.600 cm/s AV VTI:            0.226 m AV Peak Grad:      5.9  mmHg AV Mean Grad:      3.0 mmHg LVOT Vmax:         107.00 cm/s LVOT Vmean:        74.600 cm/s LVOT VTI:          0.248 m LVOT/AV VTI ratio: 1.10  AORTA Ao Root diam: 2.30 cm MITRAL VALVE                TRICUSPID VALVE  MV Area (PHT): 2.74 cm     TR Peak grad:   13.7 mmHg MV Decel Time: 277 msec     TR Vmax:        185.00 cm/s MV E velocity: 91.70 cm/s MV A velocity: 126.00 cm/s  SHUNTS MV E/A ratio:  0.73         Systemic VTI:  0.25 m                             Systemic Diam: 2.00 cm Lorine Bears MD Electronically signed by Lorine Bears MD Signature Date/Time: 01/08/2021/10:40:18 AM    Final     Lab Data:  CBC: Recent Labs  Lab 01/14/21 0506 01/15/21 0948 01/17/21 0423 01/18/21 0411 01/19/21 0654  WBC 22.1* 20.6* 32.2* 31.6* 32.0*  NEUTROABS 12.1* 15.7* 20.0*  --   --   HGB 15.7* 15.2* 15.5* 15.5* 15.7*  HCT 49.7* 48.3* 49.1* 48.6* 49.3*  MCV 94.0 92.9 93.5 93.6 93.0  PLT 145* 141* 159 167 172   Basic Metabolic Panel: Recent Labs  Lab 01/13/21 0452 01/14/21 0506 01/17/21 0423 01/18/21 0411 01/19/21 0654  NA 138 139 135 140 137  K 3.4* 4.2 3.9 3.5 3.7  CL 92* 96* 92* 94* 93*  CO2 33* 33* 33* 33* 33*  GLUCOSE 159* 140* 169* 136* 131*  BUN 34* 31* 32* 35* 34*  CREATININE 0.80 0.65 0.71 0.62 0.81  CALCIUM 8.0* 8.0* 9.1 9.4 8.9  MG 2.2 2.2 2.1  --   --   PHOS 3.4 3.3  --   --   --    GFR: Estimated Creatinine Clearance: 63.3 mL/min (by C-G formula based on SCr of 0.81 mg/dL). Liver Function Tests: No results for input(s): AST, ALT, ALKPHOS, BILITOT, PROT, ALBUMIN in the last 168 hours. No results for input(s): LIPASE, AMYLASE in the last 168 hours. No results for input(s): AMMONIA in the last 168 hours. Coagulation Profile: No results for input(s): INR, PROTIME in the last 168 hours. Cardiac Enzymes: No results for input(s): CKTOTAL, CKMB, CKMBINDEX, TROPONINI in the last 168 hours. BNP (last 3 results) No results for input(s): PROBNP in the last 8760  hours. HbA1C: No results for input(s): HGBA1C in the last 72 hours. CBG: No results for input(s): GLUCAP in the last 168 hours. Lipid Profile: No results for input(s): CHOL, HDL, LDLCALC, TRIG, CHOLHDL, LDLDIRECT in the last 72 hours. Thyroid Function Tests: No results for input(s): TSH, T4TOTAL, FREET4, T3FREE, THYROIDAB in the last 72 hours. Anemia Panel: No results for input(s): VITAMINB12, FOLATE, FERRITIN, TIBC, IRON, RETICCTPCT in the last 72 hours. Urine analysis:    Component Value Date/Time   COLORURINE AMBER (A) 01/06/2021 0723   APPEARANCEUR CLEAR (A) 01/06/2021 0723   LABSPEC >1.046 (H) 01/06/2021 0723   PHURINE 6.0 01/06/2021 0723   GLUCOSEU NEGATIVE 01/06/2021 0723   HGBUR NEGATIVE 01/06/2021 0723   BILIRUBINUR (A) 01/06/2021 0723    TEST NOT REPORTED DUE TO COLOR INTERFERENCE OF URINE PIGMENT   KETONESUR NEGATIVE 01/06/2021 0723   PROTEINUR NEGATIVE 01/06/2021 0723   NITRITE (A) 01/06/2021 0723    TEST NOT REPORTED DUE TO COLOR INTERFERENCE OF URINE PIGMENT   LEUKOCYTESUR NEGATIVE 01/06/2021 0723     Jaivion Kingsley M.D. [facility] 01/19/2021, 2:59 PM  Available via Epic secure chat 7am-7pm After 7 pm, please refer to night coverage provider listed on amion.

## 2021-01-19 NOTE — Care Management Important Message (Signed)
Important Message  Patient Details  Name: Elizabeth Rowland MRN: [id number] Date of Birth: 09-10-1941   Medicare Important Message Given:  Yes     [name] 01/19/2021, 12:10 PM

## 2021-01-19 NOTE — Progress Notes (Signed)
Name: Elizabeth Rowland MRN: [id number] DOB: 09/16/1941     CONSULTATION DATE: 01/06/2021  REFERRING MD :  Nelson Chimes  CHIEF COMPLAINT:  Shortness of breath    ASSESSMENT AND PLAN   RESPIRATORY:  Patient is cleared for dc home from pulmonary perspective.    Acute hypoxemic respiratory failure- improved - O2 therapy is down from 6 to 4L/min - 01/17/21 - >>>2l/min 01/18/21         Presumably post covid pneumonitis-changed solumedrol to pred 50 - 01/16/21 - weaning to prednisone 40mg  - 01/18/20 >>decreased to 20mg  01/19/21         Unlikely PJP pneumonia -pending fungitell       There is pulmonary edema - continue with lasix IV- 40 lasix daily -net >8L negative with preserved renal function           PT OT for atelectasis-patient is improved continue please - 01/16/21        CT with GGO and interstitial thickening, small PE noted on mediastinal cuts  PE not hemodynamic, not causing RV strain, given interstitial disease is not the cause of hypoxia  CV:     ECHO  1. Left ventricular ejection fraction, by estimation, is 55 to 60%. The  left ventricle has normal function. Left ventricular endocardial border  not optimally defined to evaluate regional wall motion. Left ventricular  diastolic parameters are consistent  with Grade I diastolic dysfunction (impaired relaxation).  2. RV systolic function and size are normal. PA pressure.  3. Left atrial size was mildly dilated.  4. Right atrial size was mildly dilated.  5. The mitral valve is normal in structure. No evidence of mitral valve  regurgitation. No evidence of mitral stenosis.  6. The aortic valve is normal in structure. Aortic valve regurgitation is  not visualized. No aortic stenosis is present.   EF preserved, no evidence for overt failure although BNP was 2 fold elevated  Trend and see now that the patient had been diuresed somewhat  ID As above  SUBJECTIVE   HPI: 80 yo WF presented to the ED via EMS for shortness  of breath and field saturation in the 40s. She can provide no history at the moment, but her son is at the bedside and the original record is used for this description. The patient has been at home except to see her pain clinic. Her son reports only a few persons visit. He cannot recall any recent (within the month or so) of cough or other prodromal symptoms. The patient was never vaccinated and does not suffer from diabetes, but she is obese. Her COVID screen is negative x2 and a full respiratory Biofire was also negative for a range of viral infections. Despite this a CT was noted to have significant GGO and interstitial infiltrates. Her BNP was two fold elevated, but the pattern does not seem of overt volume overload and an echo revealed preserved LV function. Her procalcitonin is low for pneumonia, however she did have a leukocytosis.   PAST MEDICAL HISTORY :  HTN Fibromyalgia HLD Chronic Pain  Prior to Admission medications   Medication Sig Start Date End Date Taking? Authorizing Provider  Acetaminophen 500 MG capsule Take 500 mg by mouth every 4 (four) hours as needed.   Yes [provider]  gabapentin (NEURONTIN) 300 MG capsule Take 1 capsule by mouth 3 (three) times daily. 11/24/20  Yes [provider]  pravastatin (PRAVACHOL) 10 MG tablet Take 10 mg by mouth daily. 03/26/14  Yes [provider]  traMADol (ULTRAM) 50 MG tablet Take 50 mg by mouth 3 (three) times daily as needed. 11/24/20  Yes [provider]   FAMILY HISTORY:  DM, HTN  SOCIAL HISTORY:  reports that she has never smoked. She has never used smokeless tobacco. She reports that she does not drink alcohol and does not use drugs.  REVIEW OF SYSTEMS:   Unable to obtain due to shortness of breath and mental status  Estimated body mass index is 33.8 kg/m as calculated from the following:   Height as of this encounter: 5\' 6"  (1.676 m).   Weight as of this encounter: 95 kg.   OBJECTIVE     VITAL SIGNS: Temp:  [98.3 F (36.8 C)-98.6 F (37 C)] 98.3 F (36.8 C) (05/05 1935) Pulse Rate:  [97-107] 97 (05/06 0301) Resp:  [18] 18 (05/06 0301) BP: (140-148)/(63-74) 141/63 (05/06 0301) SpO2:  [89 %-95 %] 95 % (05/06 0301) Weight:  [94.8 kg-95 kg] 95 kg (05/06 0517)   I/O last 3 completed shifts: In: 1090 [P.O.:1090] Out: 1700 [Urine:1700] No intake/output data recorded.   SpO2: 95 % O2 Flow Rate (L/min): 2 L/min FiO2 (%): 39.5 %   Physical Examination:  GENERAL:critically ill appearing, not in overt resp distress HEAD: Normocephalic, atraumatic.  EYES: Pupils equal, round, reactive to light.  No scleral icterus.  MOUTH: Moist mucosal membrane. NECK: Supple. No JVD.  PULMONARY: rhonchi CARDIOVASCULAR:RRR, no murmur  GASTROINTESTINAL: Soft, nontender, -distended.  Positive bowel sounds.  MUSCULOSKELETAL: trace edema.  NEUROLOGIC: opens eyes, tracks, non-communicating.  SKIN:intact,warm,dry   MEDICATIONS: I have reviewed all medications and confirmed regimen as documented   CULTURES: Recent Results (from the past 240 hour(s))  MRSA PCR Screening     Status: None   Collection Time: 01/09/21 12:41 PM   Specimen: Nasopharyngeal  Result Value Ref Range Status   MRSA by PCR NEGATIVE NEGATIVE Final    Comment:        The GeneXpert MRSA Assay (FDA approved for NASAL specimens only), is one component of a comprehensive MRSA colonization surveillance program. It is not intended to diagnose MRSA infection nor to guide or monitor treatment for MRSA infections. Performed at [facility], [address] [place], [place]           [id number], M.D.  Pulmonary & Critical Care Medicine  [facility]

## 2021-01-19 NOTE — TOC Progression Note (Signed)
Transition of Care [facility]) - Progression Note    Patient Details  Name: Elizabeth Rowland MRN: [id number] Date of Birth: [birth year]  Transition of [facility] CM/SW Contact  Hetty Ely, RN Phone Number: 01/19/2021, 4:42 PM  Clinical Narrative:  Spoke with Son provided him with a list of the three SNF facilities that accepted patient, [facility], [facility] and [facility]. Son choose  Health Care due to location.     Expected Discharge Plan: Home w Home Health Services Barriers to Discharge: Barriers Resolved  Expected Discharge Plan and Services Expected Discharge Plan: Home w Home Health Services     Post Acute Care Choice: Home Health Living arrangements for the past 2 months: Single Family Home Expected Discharge Date: 01/18/21               DME Arranged: Oxygen DME Agency: [employer] Date DME Agency Contacted: 01/18/21   Representative spoke with at DME Agency: Oletha Cruel, Terrence Dupont HH Arranged: PT,OT [facility] Agency: [facility] Date HH Agency Contacted: 01/18/21 Time HH Agency Contacted: [phone] Representative spoke with at [facility] Agency: Feliberto Gottron   Social Determinants of Health (SDOH) Interventions    Readmission Risk Interventions No flowsheet data found.

## 2021-01-20 LAB — BASIC METABOLIC PANEL
Anion gap: 10 (ref 5–15)
BUN: 31 mg/dL — ABNORMAL HIGH (ref 8–23)
CO2: 33 mmol/L — ABNORMAL HIGH (ref 22–32)
Calcium: 8.7 mg/dL — ABNORMAL LOW (ref 8.9–10.3)
Chloride: 94 mmol/L — ABNORMAL LOW (ref 98–111)
Creatinine, Ser: 0.72 mg/dL (ref 0.44–1.00)
GFR, Estimated: >60 mL/min (ref >60)
Glucose, Bld: 115 mg/dL — ABNORMAL HIGH (ref 70–99)
Potassium: 3.4 mmol/L — ABNORMAL LOW (ref 3.5–5.1)
Sodium: 137 mmol/L (ref 135–145)

## 2021-01-20 LAB — CBC
HCT: 48.7 % — ABNORMAL HIGH (ref 36.0–46.0)
Hemoglobin: 15.5 g/dL — ABNORMAL HIGH (ref 12.0–15.0)
MCH: 29.5 pg (ref 26.0–34.0)
MCHC: 31.8 g/dL (ref 30.0–36.0)
MCV: 92.8 fL (ref 80.0–100.0)
Platelets: 164 10*3/uL (ref 150–400)
RBC: 5.25 MIL/uL — ABNORMAL HIGH (ref 3.87–5.11)
RDW: 15.6 % — ABNORMAL HIGH (ref 11.5–15.5)
WBC: 28.5 10*3/uL — ABNORMAL HIGH (ref 4.0–10.5)
nRBC: 0 % (ref 0.0–0.2)

## 2021-01-20 MED ORDER — POTASSIUM CHLORIDE CRYS ER 20 MEQ PO TBCR
20.0000 meq | EXTENDED_RELEASE_TABLET | Freq: Once | ORAL | Status: AC
  Administered 2021-01-20: 20 meq via ORAL
  Filled 2021-01-20: qty 1

## 2021-01-20 MED ORDER — PREDNISONE 20 MG PO TABS
20.0000 mg | ORAL_TABLET | Freq: Every day | ORAL | Status: DC
  Administered 2021-01-21 – 2021-01-23 (×3): 20 mg via ORAL
  Filled 2021-01-20 (×3): qty 1

## 2021-01-20 NOTE — Progress Notes (Signed)
Name: Elizabeth Rowland MRN: [id number] DOB: 08/25/1941     CONSULTATION DATE: 01/06/2021  REFERRING MD :  Nelson Chimes  CHIEF COMPLAINT:  Shortness of breath    ASSESSMENT AND PLAN   RESPIRATORY:  Patient is cleared for dc home from pulmonary perspective. -01/20/21 Pulmonary will sign off and available if needed   Acute hypoxemic respiratory failure- improved - O2 therapy is down from 6 to 4L/min - 01/17/21 - >>>2l/min 01/18/21         Presumably post covid pneumonitis-changed solumedrol to pred 50 - 01/16/21 - weaning to prednisone 40mg  - 01/18/20 >>decreased to 20mg  01/19/21         Unlikely PJP pneumonia -pending fungitell       There is pulmonary edema - continue with lasix IV- 40 lasix daily -net >8L negative with preserved renal function           PT OT for atelectasis-patient is improved continue please - 01/16/21        CT with GGO and interstitial thickening, small PE noted on mediastinal cuts  PE not hemodynamic, not causing RV strain, given interstitial disease is not the cause of hypoxia  CV:     ECHO  1. Left ventricular ejection fraction, by estimation, is 55 to 60%. The  left ventricle has normal function. Left ventricular endocardial border  not optimally defined to evaluate regional wall motion. Left ventricular  diastolic parameters are consistent  with Grade I diastolic dysfunction (impaired relaxation).  2. RV systolic function and size are normal. PA pressure.  3. Left atrial size was mildly dilated.  4. Right atrial size was mildly dilated.  5. The mitral valve is normal in structure. No evidence of mitral valve  regurgitation. No evidence of mitral stenosis.  6. The aortic valve is normal in structure. Aortic valve regurgitation is  not visualized. No aortic stenosis is present.   EF preserved, no evidence for overt failure although BNP was 2 fold elevated  Trend and see now that the patient had been diuresed somewhat  ID As above  SUBJECTIVE    HPI: 80 yo WF presented to the ED via EMS for shortness of breath and field saturation in the 40s. She can provide no history at the moment, but her son is at the bedside and the original record is used for this description. The patient has been at home except to see her pain clinic. Her son reports only a few persons visit. He cannot recall any recent (within the month or so) of cough or other prodromal symptoms. The patient was never vaccinated and does not suffer from diabetes, but she is obese. Her COVID screen is negative x2 and a full respiratory Biofire was also negative for a range of viral infections. Despite this a CT was noted to have significant GGO and interstitial infiltrates. Her BNP was two fold elevated, but the pattern does not seem of overt volume overload and an echo revealed preserved LV function. Her procalcitonin is low for pneumonia, however she did have a leukocytosis.   PAST MEDICAL HISTORY :  HTN Fibromyalgia HLD Chronic Pain  Prior to Admission medications   Medication Sig Start Date End Date Taking? Authorizing Provider  Acetaminophen 500 MG capsule Take 500 mg by mouth every 4 (four) hours as needed.   Yes [provider]  gabapentin (NEURONTIN) 300 MG capsule Take 1 capsule by mouth 3 (three) times daily. 11/24/20  Yes [provider]  pravastatin (PRAVACHOL) 10 MG tablet  Take 10 mg by mouth daily. 03/26/14  Yes [provider]  traMADol (ULTRAM) 50 MG tablet Take 50 mg by mouth 3 (three) times daily as needed. 11/24/20  Yes [provider]   FAMILY HISTORY:  DM, HTN  SOCIAL HISTORY:  reports that she has never smoked. She has never used smokeless tobacco. She reports that she does not drink alcohol and does not use drugs.  REVIEW OF SYSTEMS:   Unable to obtain due to shortness of breath and mental status  Estimated body mass index is 34.22 kg/m as calculated from the following:   Height as of this encounter: 5\' 6"  (1.676  m).   Weight as of this encounter: 96.2 kg.   OBJECTIVE    VITAL SIGNS: Temp:  [97.6 F (36.4 C)-98.6 F (37 C)] 97.6 F (36.4 C) (05/07 0732) Pulse Rate:  [82-102] 82 (05/07 0732) Resp:  [17-20] 20 (05/07 0732) BP: (127-148)/(66-80) 148/77 (05/07 0732) SpO2:  [93 %-98 %] 98 % (05/07 0732) Weight:  [96.2 kg] 96.2 kg (05/07 0415)   I/O last 3 completed shifts: In: 1080 [P.O.:1080] Out: 1000 [Urine:1000] Total I/O In: 720 [P.O.:720] Out: -    SpO2: 98 % O2 Flow Rate (L/min): 2 L/min FiO2 (%): 39.5 %   Physical Examination:  GENERAL:critically ill appearing, not in overt resp distress HEAD: Normocephalic, atraumatic.  EYES: Pupils equal, round, reactive to light.  No scleral icterus.  MOUTH: Moist mucosal membrane. NECK: Supple. No JVD.  PULMONARY: rhonchi CARDIOVASCULAR:RRR, no murmur  GASTROINTESTINAL: Soft, nontender, -distended.  Positive bowel sounds.  MUSCULOSKELETAL: trace edema.  NEUROLOGIC: opens eyes, tracks, non-communicating.  SKIN:intact,warm,dry   MEDICATIONS: I have reviewed all medications and confirmed regimen as documented   CULTURES: No results found for this or any previous visit (from the past 240 hour(s)).        08-07-1986, M.D.  Pulmonary & Critical Care Medicine  [facility]

## 2021-01-20 NOTE — Progress Notes (Signed)
[facility]                                                                              Patient Demographics  Elizabeth Rowland, is a 80 y.o. female, DOB - 03-17-1941, ZOX:[id number]  Admit date - 01/06/2021   Admitting Physician Marrion Coy, MD  Outpatient Primary MD for the patient is Leanna Sato, MD  Outpatient specialists:   LOS - 13  days   Medical records reviewed and are as summarized below:    Chief Complaint  Patient presents with  . Shortness of Breath    Loss of taiste x 2 weeks, sob, pt from home        Brief summary    Patient is a 80 year old female with history of hypertension, hyperlipidemia, fibromyalgia presented to the hospital for shortness of breath, was hypoxic in ED and requiring 10 L O2.  Patient was found to have severe sepsis secondary to pneumonia, acute pulmonary embolism and acute hypoxic respiratory failure.  She also had ARDS, test for COVID-19 were negative.  Patient was treated with empiric IV antibiotics, IV heparin, IV steroids and IV Lasix.  Subsequently transitioned to oral Eliquis for long-term anticoagulation.  Currently off of HFNC however still on 6 L O2 via [place], weaning down. Pulmonology consulted.  She has a thyroid nodule and outpatient follow-up is recommended for further evaluation.  She also had a right mid to lower lobe mass-like opacity on chest x-ray but this was not evident on CT chest.  Outpatient follow-up is recommended for this as well.  She was evaluated by PT and OT recommended discharge to SNF  Assessment & Plan     Principal problem  Severe sepsis with acute organ dysfunction (HCC), acute respiratory failure with hypoxia, ARDS -Improving, she has been tapered off of HFNC, now sats 95% on 2 L -Completed 8 days of IV antibiotics on 01/13/2021. -Completed 3-day course of high-dose steroids on 4/27, was resumed on low-dose IV steroids on 5/1.  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 -2D echo showed EF of 55 to 60% with grade 1 diastolic dysfunction, no overt heart failure -Initially placed on IV Lasix, now has been transitioned to oral Lasix 40 mg daily -BNP improved 45, Cr stable  -Negative balance of 8.4 L     Hyperlipidemia -Continue pravastatin  Leukocytosis -Likely reactive or related to steroids, no fevers -Improving  Bilateral pulmonary embolism, right peroneal DVT -Continue eliquis, H&H stable  Oral thrush -Continue nystatin  Demand ischemia with elevated troponin likely due to severe sepsis, hypoxia, mild volume overload -Currently no chest pain or any worsening cardiac symptoms  Right mid to lower lung mass like opacity on chest x-ray -Incidently seen on chest x-ray but not seen on CT chest.  Outpatient follow-up recommended  Thyroid nodule -Outpatient follow-up recommended  Chronic pain/fibromyalgia Continue tramadol  TID PRN per home regimen     Mild protein calorie malnutrition  due to inadequate oral intake, acute on -Dietitian following, continue nutritional supplements  Obesity Estimated body mass index is 34.22 kg/m as calculated from the following:   Height as of this encounter: 5\' 6"  (1.676 m).   Weight as of this encounter: 96.2 kg.   Goals of care, generalized debility -Discussed with patient's son at the bedside, recommended SNF as patient is still debilitated.  Patient however wants to go home and his son okay with patient's wishes. PT also feels patient is too debilitated and if son would be able to assist with ADLs.  If he is not, then will recommend SNF.  Code Status: Full DVT Prophylaxis:  Place TED hose Start: 01/06/21 1523 apixaban (ELIQUIS) tablet 5 mg   Level of Care: Level of care: Progressive  Cardiac Family Communication: Discussed all imaging results, lab results, explained to the patient and son at bedside today   Disposition Plan:     Status is: Inpatient  Remains inpatient appropriate because:Inpatient level of care appropriate due to severity of illness   Dispo: The patient is from: Home              Anticipated d/c is to: TBD              Patient currently is medically stable to d/c.  Awaiting for skilled nursing facility bed   Difficult to place patient No      Time Spent in minutes 35 minutes  Procedures:  None  Consultants:   Pulmonology Palliative medicine  Antimicrobials:   Anti-infectives (From admission, onward)   Start     Dose/Rate Route Frequency Ordered Stop   01/09/21 1230  ceFEPIme (MAXIPIME) 2 g in sodium chloride 0.9 % 100 mL IVPB  Status:  Discontinued        2 g 200 mL/hr over 30 Minutes Intravenous Every 8 hours 01/09/21 1201 01/13/21 1429   01/07/21 1500  cefTRIAXone (ROCEPHIN) 2 g in sodium chloride 0.9 % 100 mL IVPB  Status:  Discontinued        2 g 200 mL/hr over 30 Minutes Intravenous Every 24 hours 01/06/21 1524 01/07/21 1207   01/07/21 1500  azithromycin (ZITHROMAX) 500 mg in sodium chloride 0.9 % 250 mL IVPB        500 mg 250 mL/hr over 60 Minutes Intravenous Every 24 hours 01/06/21 1524 01/12/21 1524   01/06/21 1515  cefTRIAXone (ROCEPHIN) 2 g in sodium chloride 0.9 % 100 mL IVPB        2 g 200 mL/hr over 30 Minutes Intravenous  Once 01/06/21 1506 01/06/21 1546   01/06/21 1515  azithromycin (ZITHROMAX) 500 mg in sodium chloride 0.9 % 250 mL IVPB        500 mg 250 mL/hr over 60 Minutes Intravenous  Once 01/06/21 1506 01/06/21 1645         Medications  Scheduled Meds: . apixaban  5 mg Oral BID  . feeding supplement  237 mL Oral QHS  . furosemide  40 mg Oral Daily  . gabapentin  100 mg Oral TID  . multivitamin with minerals  1 tablet Oral Daily  . nystatin  5 mL Oral QID  . potassium chloride  20 mEq Oral Once  .  pravastatin  10 mg Oral Daily  . [START ON 01/21/2021] predniSONE  20 mg Oral Q breakfast  . senna  1 tablet Oral QHS  . traMADol  100 mg Oral TID   Continuous Infusions: . sodium chloride 250 mL (01/13/21 1302)  PRN Meds:.sodium chloride, acetaminophen **OR** acetaminophen, hydrALAZINE, ondansetron **OR** ondansetron (ZOFRAN) IV      Subjective:   Elizabeth MingsElizabeth Rowland was seen and examined today.  No acute complaints.  Awaiting skilled nursing facility.  Son at the bedside.   Objective:   Vitals:   01/19/21 2100 01/20/21 0415 01/20/21 0732 01/20/21 1205  BP: (!) 143/66 137/77 (!) 148/77 (!) 141/74  Pulse: 99 84 82 94  Resp: 18 17 20 17   Temp: 98.2 F (36.8 C) 98.2 F (36.8 C) 97.6 F (36.4 C) 97.6 F (36.4 C)  TempSrc: Oral  Oral Oral  SpO2: 94% 96% 98% 95%  Weight:  96.2 kg    Height:        Intake/Output Summary (Last 24 hours) at 01/20/2021 1407 Last data filed at 01/20/2021 1100 Gross per 24 hour  Intake 960 ml  Output 1100 ml  Net -140 ml     Wt Readings from Last 3 Encounters:  01/20/21 96.2 kg   Physical Exam  General: Somewhat sleepy but easily arousable, frail  Cardiovascular: S1 S2 clear, RRR. No pedal edema b/l  Respiratory: Fairly CTA, anteriorly  Gastrointestinal: Soft, nontender, nondistended, NBS  Ext: no pedal edema bilaterally  Neuro: no new deficits  Musculoskeletal: No cyanosis, clubbing  Skin: Chronic lesions on the bilateral legs  Psych:     Data Reviewed:  I have personally reviewed following labs and imaging studies  Micro Results No results found for this or any previous visit (from the past 240 hour(s)).  Radiology Reports CT Angio Chest PE W and/or Wo Contrast  Addendum Date: 01/06/2021   ADDENDUM REPORT: 01/06/2021 16:38 ADDENDUM: Of note, in the CTA CHEST FINDINGS section, under the Cardiovascular subsection, the original report incorrectly states that there is no pericardial effusion. This section should read that  there is a small pericardial effusion. The right report is otherwise unchanged. Electronically Signed   By: Romona Curlsyler  Litton M.D.   On: 01/06/2021 16:38   Result Date: 01/06/2021 CLINICAL DATA:  Increased shortness of breath for 2 weeks, concern for pulmonary embolism and metastatic disease. EXAM: CT ANGIOGRAPHY CHEST CT ABDOMEN AND PELVIS WITH CONTRAST TECHNIQUE: Multidetector CT imaging of the chest was performed using the standard protocol during bolus administration of intravenous contrast. Multiplanar CT image reconstructions and MIPs were obtained to evaluate the vascular anatomy. Multidetector CT imaging of the abdomen and pelvis was performed using the standard protocol during bolus administration of intravenous contrast. CONTRAST:  75mL OMNIPAQUE IOHEXOL 350 MG/ML SOLN COMPARISON:  Same day chest radiograph and CT abdomen dated 11/20/2007. FINDINGS: CTA CHEST FINDINGS Cardiovascular: Satisfactory opacification of the pulmonary arteries to the segmental level. Multiple filling defects in segmental and subsegmental pulmonary arteries supplying the right upper lobe and left upper lobe are consistent with pulmonary emboli. The main pulmonary artery is enlarged, measuring 3.3 cm in diameter, suggestive of pulmonary hypertension. Vascular calcifications are seen in the coronary arteries and aortic arch. Normal heart size. No pericardial effusion. Mediastinum/Nodes: Bilateral hilar and mediastinal adenopathy is noted. For reference, a right paratracheal lymph node measures 1.1 cm short axis (series 5, image 26). No enlarged axillary lymph nodes. A left thyroid nodule measures 2.9 x 1.5 cm. The trachea and esophagus demonstrate no significant findings. Lungs/Pleura: Severe diffuse bilateral ground-glass opacities and septal line thickening is seen. There is a small left and trace right pleural effusion with associated atelectasis. There is no pneumothorax. Musculoskeletal: No chest wall abnormality. No acute or  significant osseous findings. Review of the  MIP images confirms the above findings. CT ABDOMEN and PELVIS FINDINGS Hepatobiliary: An 8 mm cyst is seen in the right hepatic lobe. No gallstones, gallbladder wall thickening, or biliary dilatation. Pancreas: Unremarkable. No pancreatic ductal dilatation or surrounding inflammatory changes. Spleen: Normal in size without focal abnormality. Adrenals/Urinary Tract: Adrenal glands are unremarkable. Other than a 2 cm cyst in the left kidney, the kidneys are normal, without renal calculi, focal lesion, or hydronephrosis. Bladder is unremarkable. Stomach/Bowel: There is a small hiatal hernia. Appendix appears normal. No evidence of bowel wall thickening, distention, or inflammatory changes. Vascular/Lymphatic: Aortic atherosclerosis. No enlarged abdominal or pelvic lymph nodes. Reproductive: Uterus and bilateral adnexa are unremarkable. Other: There is a fat containing ventral abdominal wall hernia near the umbilicus. No free intraperitoneal fluid. Musculoskeletal: Degenerative changes are seen in the spine. Review of the MIP images confirms the above findings. IMPRESSION: 1. Multiple filling defects in segmental and subsegmental pulmonary arteries supplying the right upper lobe and left upper lobe, consistent with pulmonary emboli. 2. Severe diffuse bilateral ground-glass opacities and septal line thickening likely represent pulmonary edema/pneumonia. Small left and trace right pleural effusions with associated atelectasis. The previously questioned masslike opacity overlying the right mid and lower lung likely reflected the right atrial silhouette. 3. Bilateral hilar and mediastinal adenopathy is likely reactive. 4. Left thyroid nodule measures 2.9 x 1.5 cm. Recommend thyroid US (ref: J Am Coll Radiol. 2015 Feb;12(2): 143-50). 5. No acute process in the abdomen or pelvis. Aortic Atherosclerosis (ICD10-I70.0). These results were called by telephone at the time of  interpretation on 01/06/2021 at 4:10 pm to provider Dr. Dionne Bucy, who verbally acknowledged these results. Electronically Signed: By: Romona Curls M.D. On: 01/06/2021 16:11   CT ABDOMEN PELVIS W CONTRAST  Addendum Date: 01/06/2021   ADDENDUM REPORT: 01/06/2021 16:38 ADDENDUM: Of note, in the CTA CHEST FINDINGS section, under the Cardiovascular subsection, the original report incorrectly states that there is no pericardial effusion. This section should read that there is a small pericardial effusion. The right report is otherwise unchanged. Electronically Signed   By: Romona Curls M.D.   On: 01/06/2021 16:38   Result Date: 01/06/2021 CLINICAL DATA:  Increased shortness of breath for 2 weeks, concern for pulmonary embolism and metastatic disease. EXAM: CT ANGIOGRAPHY CHEST CT ABDOMEN AND PELVIS WITH CONTRAST TECHNIQUE: Multidetector CT imaging of the chest was performed using the standard protocol during bolus administration of intravenous contrast. Multiplanar CT image reconstructions and MIPs were obtained to evaluate the vascular anatomy. Multidetector CT imaging of the abdomen and pelvis was performed using the standard protocol during bolus administration of intravenous contrast. CONTRAST:  75mL OMNIPAQUE IOHEXOL 350 MG/ML SOLN COMPARISON:  Same day chest radiograph and CT abdomen dated 11/20/2007. FINDINGS: CTA CHEST FINDINGS Cardiovascular: Satisfactory opacification of the pulmonary arteries to the segmental level. Multiple filling defects in segmental and subsegmental pulmonary arteries supplying the right upper lobe and left upper lobe are consistent with pulmonary emboli. The main pulmonary artery is enlarged, measuring 3.3 cm in diameter, suggestive of pulmonary hypertension. Vascular calcifications are seen in the coronary arteries and aortic arch. Normal heart size. No pericardial effusion. Mediastinum/Nodes: Bilateral hilar and mediastinal adenopathy is noted. For reference, a right  paratracheal lymph node measures 1.1 cm short axis (series 5, image 26). No enlarged axillary lymph nodes. A left thyroid nodule measures 2.9 x 1.5 cm. The trachea and esophagus demonstrate no significant findings. Lungs/Pleura: Severe diffuse bilateral ground-glass opacities and septal line thickening is seen. There is a  small left and trace right pleural effusion with associated atelectasis. There is no pneumothorax. Musculoskeletal: No chest wall abnormality. No acute or significant osseous findings. Review of the MIP images confirms the above findings. CT ABDOMEN and PELVIS FINDINGS Hepatobiliary: An 8 mm cyst is seen in the right hepatic lobe. No gallstones, gallbladder wall thickening, or biliary dilatation. Pancreas: Unremarkable. No pancreatic ductal dilatation or surrounding inflammatory changes. Spleen: Normal in size without focal abnormality. Adrenals/Urinary Tract: Adrenal glands are unremarkable. Other than a 2 cm cyst in the left kidney, the kidneys are normal, without renal calculi, focal lesion, or hydronephrosis. Bladder is unremarkable. Stomach/Bowel: There is a small hiatal hernia. Appendix appears normal. No evidence of bowel wall thickening, distention, or inflammatory changes. Vascular/Lymphatic: Aortic atherosclerosis. No enlarged abdominal or pelvic lymph nodes. Reproductive: Uterus and bilateral adnexa are unremarkable. Other: There is a fat containing ventral abdominal wall hernia near the umbilicus. No free intraperitoneal fluid. Musculoskeletal: Degenerative changes are seen in the spine. Review of the MIP images confirms the above findings. IMPRESSION: 1. Multiple filling defects in segmental and subsegmental pulmonary arteries supplying the right upper lobe and left upper lobe, consistent with pulmonary emboli. 2. Severe diffuse bilateral ground-glass opacities and septal line thickening likely represent pulmonary edema/pneumonia. Small left and trace right pleural effusions with  associated atelectasis. The previously questioned masslike opacity overlying the right mid and lower lung likely reflected the right atrial silhouette. 3. Bilateral hilar and mediastinal adenopathy is likely reactive. 4. Left thyroid nodule measures 2.9 x 1.5 cm. Recommend thyroid US (ref: J Am Coll Radiol. 2015 Feb;12(2): 143-50). 5. No acute process in the abdomen or pelvis. Aortic Atherosclerosis (ICD10-I70.0). These results were called by telephone at the time of interpretation on 01/06/2021 at 4:10 pm to provider Dr. Dionne Bucy, who verbally acknowledged these results. Electronically Signed: By: Romona Curls M.D. On: 01/06/2021 16:11   US Venous Img Lower Bilateral (DVT)  Result Date: 01/07/2021 CLINICAL DATA:  Pulmonary embolism. EXAM: BILATERAL LOWER EXTREMITY VENOUS DOPPLER ULTRASOUND TECHNIQUE: Gray-scale sonography with compression, as well as color and duplex ultrasound, were performed to evaluate the deep venous system(s) from the level of the common femoral vein through the popliteal and proximal calf veins. COMPARISON:  None. FINDINGS: VENOUS On the right, hypoechoic thrombus in a noncompressible peroneal vein. Normal compressibility of the common femoral, superficial femoral, and popliteal veins. Visualized portions of profunda femoral vein and great saphenous vein unremarkable. No filling defects to suggest DVT on grayscale or color Doppler imaging. On the left, Normal compressibility of the common femoral, superficial femoral, and popliteal veins, as well as the visualized calf veins. Visualized portions of profunda femoral vein and great saphenous vein unremarkable. No filling defects to suggest DVT on grayscale or color Doppler imaging. Doppler waveforms show normal direction of venous flow, normal respiratory phasicity and response to augmentation. OTHER None. Limitations: none IMPRESSION: 1. POSITIVE for isolated right peroneal (calf) DVT. 2. Negative for left lower extremity DVT.  Electronically Signed   By: Corlis Leak M.D.   On: 01/07/2021 08:03   DG CHEST PORT 1 VIEW  Result Date: 01/08/2021 CLINICAL DATA:  Increased shortness of breath. EXAM: PORTABLE CHEST 1 VIEW COMPARISON:  01/06/2021 chest radiograph and CT chest. FINDINGS: Patient is rotated. Heart is enlarged, stable. Similar to minimally increased diffuse mixed interstitial and airspace opacification. There may be a small right pleural effusion. IMPRESSION: 1. Similar to minimally increased diffuse mixed interstitial and airspace opacification. Findings may be due to edema or viral/atypical  pneumonia, including due to COVID-19. 2. Probable small right pleural effusion. Electronically Signed   By: Leanna Battles M.D.   On: 01/08/2021 08:54   DG Chest Portable 1 View  Result Date: 01/06/2021 CLINICAL DATA:  Shortness of breath EXAM: PORTABLE CHEST 1 VIEW COMPARISON:  07/06/2014 FINDINGS: Heart size appears within normal limits. Rounded mass-like opacity within the right mid to lower lung field measuring approximately 6.4 cm in diameter. Diffuse interstitial opacities throughout both lungs. Small right pleural effusion. No pneumothorax. IMPRESSION: 1. Rounded mass-like opacity within the right mid to lower lung field measuring approximately 6.4 cm in diameter. Further evaluation with contrast-enhanced CT of the chest is recommended. 2. Extensive bilateral interstitial opacities which may reflect pulmonary edema or possibly atypical/viral infection. 3. Small right pleural effusion. Electronically Signed   By: Duanne Guess D.O.   On: 01/06/2021 14:12   ECHOCARDIOGRAM COMPLETE  Result Date: 01/08/2021    ECHOCARDIOGRAM REPORT   Patient Name:   HILARI WETHINGTON Seiber Date of Exam: 01/08/2021 Medical Rec #:  [id number]           Height:       66.0 in Accession #:    [id number]          Weight:       219.4 lb Date of Birth:  01/27/1941          BSA:          2.080 m Patient Age:    79 years            BP:           158/58 mmHg  Patient Gender: F                   HR:           92 bpm. Exam Location:  [facility] Procedure: 2D Echo, Cardiac Doppler and Color Doppler Indications:     Pulmonary embolus I26.09  History:         Patient has no prior history of Echocardiogram examinations. No                  medical history on file.  Sonographer:     Cristela Blue RDCS (AE) Referring Phys:  [id number] AMY N [facility] Diagnosing Phys: Lorine Bears MD  Sonographer Comments: Technically challenging study due to limited acoustic windows and suboptimal apical window. IMPRESSIONS  1. Left ventricular ejection fraction, by estimation, is 55 to 60%. The left ventricle has normal function. Left ventricular endocardial border not optimally defined to evaluate regional wall motion. Left ventricular diastolic parameters are consistent with Grade I diastolic dysfunction (impaired relaxation).  2. Right ventricular systolic function is normal. The right ventricular size is normal. Tricuspid regurgitation signal is inadequate for assessing PA pressure.  3. Left atrial size was mildly dilated.  4. Right atrial size was mildly dilated.  5. The mitral valve is normal in structure. No evidence of mitral valve regurgitation. No evidence of mitral stenosis.  6. The aortic valve is normal in structure. Aortic valve regurgitation is not visualized. No aortic stenosis is present. FINDINGS  Left Ventricle: Left ventricular ejection fraction, by estimation, is 55 to 60%. The left ventricle has normal function. Left ventricular endocardial border not optimally defined to evaluate regional wall motion. The left ventricular internal cavity size was normal in size. There is no left ventricular hypertrophy. Left ventricular diastolic parameters are consistent with Grade I diastolic dysfunction (impaired relaxation). Right Ventricle: The right ventricular  size is normal. No increase in right ventricular wall thickness. Right ventricular systolic function is normal. Tricuspid regurgitation  signal is inadequate for assessing PA pressure. Left Atrium: Left atrial size was mildly dilated. Right Atrium: Right atrial size was mildly dilated. Pericardium: There is no evidence of pericardial effusion. Mitral Valve: The mitral valve is normal in structure. No evidence of mitral valve regurgitation. No evidence of mitral valve stenosis. Tricuspid Valve: The tricuspid valve is normal in structure. Tricuspid valve regurgitation is not demonstrated. No evidence of tricuspid stenosis. Aortic Valve: The aortic valve is normal in structure. Aortic valve regurgitation is not visualized. No aortic stenosis is present. Aortic valve mean gradient measures 3.0 mmHg. Aortic valve peak gradient measures 5.9 mmHg. Aortic valve area, by VTI measures 3.45 cm. Pulmonic Valve: The pulmonic valve was normal in structure. Pulmonic valve regurgitation is not visualized. No evidence of pulmonic stenosis. Aorta: The aortic root is normal in size and structure. Venous: The inferior vena cava was not well visualized. IAS/Shunts: No atrial level shunt detected by color flow Doppler.  LEFT VENTRICLE PLAX 2D LVIDd:         3.36 cm  Diastology LVIDs:         2.49 cm  LV e' medial:    4.46 cm/s LV PW:         1.08 cm  LV E/e' medial:  20.6 LV IVS:        0.95 cm  LV e' lateral:   6.74 cm/s LVOT diam:     2.00 cm  LV E/e' lateral: 13.6 LV SV:         78 LV SV Index:   37 LVOT Area:     3.14 cm  RIGHT VENTRICLE RV Basal diam:  3.98 cm RV S prime:     12.40 cm/s LEFT ATRIUM             Index       RIGHT ATRIUM           Index LA diam:        4.00 cm 1.92 cm/m  RA Area:     23.30 cm LA Vol (A2C):   54.5 ml 26.20 ml/m RA Volume:   78.60 ml  37.79 ml/m LA Vol (A4C):   56.6 ml 27.21 ml/m LA Biplane Vol: 55.3 ml 26.59 ml/m  AORTIC VALVE                   PULMONIC VALVE AV Area (Vmax):    2.78 cm    PV Vmax:        0.60 m/s AV Area (Vmean):   2.71 cm    PV Peak grad:   1.4 mmHg AV Area (VTI):     3.45 cm    RVOT Peak grad: 4 mmHg AV  Vmax:           121.00 cm/s AV Vmean:          86.600 cm/s AV VTI:            0.226 m AV Peak Grad:      5.9 mmHg AV Mean Grad:      3.0 mmHg LVOT Vmax:         107.00 cm/s LVOT Vmean:        74.600 cm/s LVOT VTI:          0.248 m LVOT/AV VTI ratio: 1.10  AORTA Ao Root diam: 2.30 cm MITRAL VALVE  TRICUSPID VALVE MV Area (PHT): 2.74 cm     TR Peak grad:   13.7 mmHg MV Decel Time: 277 msec     TR Vmax:        185.00 cm/s MV E velocity: 91.70 cm/s MV A velocity: 126.00 cm/s  SHUNTS MV E/A ratio:  0.73         Systemic VTI:  0.25 m                             Systemic Diam: 2.00 cm Lorine Bears MD Electronically signed by Lorine Bears MD Signature Date/Time: 01/08/2021/10:40:18 AM    Final     Lab Data:  CBC: Recent Labs  Lab 01/14/21 0506 01/15/21 7253 01/17/21 0423 01/18/21 0411 01/19/21 0654 01/20/21 0612  WBC 22.1* 20.6* 32.2* 31.6* 32.0* 28.5*  NEUTROABS 12.1* 15.7* 20.0*  --   --   --   HGB 15.7* 15.2* 15.5* 15.5* 15.7* 15.5*  HCT 49.7* 48.3* 49.1* 48.6* 49.3* 48.7*  MCV 94.0 92.9 93.5 93.6 93.0 92.8  PLT 145* 141* 159 167 172 164   Basic Metabolic Panel: Recent Labs  Lab 01/14/21 0506 01/17/21 0423 01/18/21 0411 01/19/21 0654 01/20/21 0612  NA 139 135 140 137 137  K 4.2 3.9 3.5 3.7 3.4*  CL 96* 92* 94* 93* 94*  CO2 33* 33* 33* 33* 33*  GLUCOSE 140* 169* 136* 131* 115*  BUN 31* 32* 35* 34* 31*  CREATININE 0.65 0.71 0.62 0.81 0.72  CALCIUM 8.0* 9.1 9.4 8.9 8.7*  MG 2.2 2.1  --   --   --   PHOS 3.3  --   --   --   --    GFR: Estimated Creatinine Clearance: 64.5 mL/min (by C-G formula based on SCr of 0.72 mg/dL). Liver Function Tests: No results for input(s): AST, ALT, ALKPHOS, BILITOT, PROT, ALBUMIN in the last 168 hours. No results for input(s): LIPASE, AMYLASE in the last 168 hours. No results for input(s): AMMONIA in the last 168 hours. Coagulation Profile: No results for input(s): INR, PROTIME in the last 168 hours. Cardiac Enzymes: No results  for input(s): CKTOTAL, CKMB, CKMBINDEX, TROPONINI in the last 168 hours. BNP (last 3 results) No results for input(s): PROBNP in the last 8760 hours. HbA1C: No results for input(s): HGBA1C in the last 72 hours. CBG: No results for input(s): GLUCAP in the last 168 hours. Lipid Profile: No results for input(s): CHOL, HDL, LDLCALC, TRIG, CHOLHDL, LDLDIRECT in the last 72 hours. Thyroid Function Tests: No results for input(s): TSH, T4TOTAL, FREET4, T3FREE, THYROIDAB in the last 72 hours. Anemia Panel: No results for input(s): VITAMINB12, FOLATE, FERRITIN, TIBC, IRON, RETICCTPCT in the last 72 hours. Urine analysis:    Component Value Date/Time   COLORURINE AMBER (A) 01/06/2021 0723   APPEARANCEUR CLEAR (A) 01/06/2021 0723   LABSPEC >1.046 (H) 01/06/2021 0723   PHURINE 6.0 01/06/2021 0723   GLUCOSEU NEGATIVE 01/06/2021 0723   HGBUR NEGATIVE 01/06/2021 0723   BILIRUBINUR (A) 01/06/2021 0723    TEST NOT REPORTED DUE TO COLOR INTERFERENCE OF URINE PIGMENT   KETONESUR NEGATIVE 01/06/2021 0723   PROTEINUR NEGATIVE 01/06/2021 0723   NITRITE (A) 01/06/2021 0723    TEST NOT REPORTED DUE TO COLOR INTERFERENCE OF URINE PIGMENT   LEUKOCYTESUR NEGATIVE 01/06/2021 0723     Tamkia Temples M.D. [facility] 01/20/2021, 2:07 PM  Available via Epic secure chat 7am-7pm After 7 pm, please refer to night coverage  provider listed on amion.

## 2021-01-21 NOTE — Progress Notes (Signed)
[facility]                                                                              Patient Demographics  Elizabeth Rowland, is a 80 y.o. female, DOB - 01-01-1941, ZOX:[id number]  Admit date - 01/06/2021   Admitting Physician Marrion Coyekui Zhang, MD  Outpatient Primary MD for the patient is Leanna SatoMiles, Linda M, MD  Outpatient specialists:   LOS - 14  days   Medical records reviewed and are as summarized below:    Chief Complaint  Patient presents with  . Shortness of Breath    Loss of taiste x 2 weeks, sob, pt from home        Brief summary    Patient is a 80 year old female with history of hypertension, hyperlipidemia, fibromyalgia presented to the hospital for shortness of breath, was hypoxic in ED and requiring 10 L O2.  Patient was found to have severe sepsis secondary to pneumonia, acute pulmonary embolism and acute hypoxic respiratory failure.  She also had ARDS, test for COVID-19 were negative.  Patient was treated with empiric IV antibiotics, IV heparin, IV steroids and IV Lasix.  Subsequently transitioned to oral Eliquis for long-term anticoagulation.  Currently off of HFNC however still on 6 L O2 via [place], weaning down. Pulmonology consulted.  She has a thyroid nodule and outpatient follow-up is recommended for further evaluation.  She also had a right mid to lower lobe mass-like opacity on chest x-ray but this was not evident on CT chest.  Outpatient follow-up is recommended for this as well.  She was evaluated by PT and OT recommended discharge to SNF  Assessment & Plan     Principal problem  Severe sepsis with acute organ dysfunction (HCC), acute respiratory failure with hypoxia, ARDS -Much improved, weaned off of HFNC, O2 sats 94% on 2 L -Completed 8 days of IV antibiotics on 01/13/2021. -Completed 3-day course of high-dose steroids on 4/27, was resumed on low-dose IV steroids on 5/1.  -Prednisone now tapered to 20 mg daily.  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  -BNP improved 45, Cr stable  -Negative balance of 9.5 L.     Hyperlipidemia -Continue pravastatin  Leukocytosis -Likely reactive or related to steroids, no fevers  Bilateral pulmonary embolism, right peroneal DVT -H&H stable, continue eliquis  Hypokalemia -Potassium 3.4, replaced 20 mEq p.o. x1  Oral thrush -Continue nystatin  Demand ischemia with elevated troponin likely due to severe sepsis, hypoxia, mild volume overload -Currently no chest pain or any worsening cardiac symptoms  Right mid to lower lung mass like opacity on chest x-ray -Incidently seen on chest x-ray but not seen on CT chest.  Outpatient follow-up recommended  Thyroid nodule -Outpatient follow-up recommended  Chronic pain/fibromyalgia Continue tramadol 100mg  TID PRN per home regimen     Mild protein calorie malnutrition due to inadequate  oral intake, acute on -Dietitian following, continue nutritional supplements  Obesity Estimated body mass index is 34.09 kg/m as calculated from the following:   Height as of this encounter: 5\' 6"  (1.676 m).   Weight as of this encounter: 95.8 kg.   Goals of care, generalized debility -Patient and son agreeable for SNF, awaiting for facility  Code Status: Full DVT Prophylaxis:  Place TED hose Start: 01/06/21 1523 apixaban (ELIQUIS) tablet 5 mg   Level of Care: Level of care: Progressive Cardiac Family Communication: Discussed all imaging results, lab results, explained to the patient and son at the bedside today   Disposition Plan:     Status is: Inpatient  Remains inpatient appropriate because:Inpatient level of care appropriate due to severity  of illness   Dispo: The patient is from: Home              Anticipated d/c is to: TBD              Patient currently is medically stable to d/c.  Awaiting skilled nursing facility   Difficult to place patient No      Time Spent in minutes 25 minutes  Procedures:  None  Consultants:   Pulmonology Palliative medicine  Antimicrobials:   Anti-infectives (From admission, onward)   Start     Dose/Rate Route Frequency Ordered Stop   01/09/21 1230  ceFEPIme (MAXIPIME) 2 g in sodium chloride 0.9 % 100 mL IVPB  Status:  Discontinued        2 g 200 mL/hr over 30 Minutes Intravenous Every 8 hours 01/09/21 1201 01/13/21 1429   01/07/21 1500  cefTRIAXone (ROCEPHIN) 2 g in sodium chloride 0.9 % 100 mL IVPB  Status:  Discontinued        2 g 200 mL/hr over 30 Minutes Intravenous Every 24 hours 01/06/21 1524 01/07/21 1207   01/07/21 1500  azithromycin (ZITHROMAX) 500 mg in sodium chloride 0.9 % 250 mL IVPB        500 mg 250 mL/hr over 60 Minutes Intravenous Every 24 hours 01/06/21 1524 01/12/21 1524   01/06/21 1515  cefTRIAXone (ROCEPHIN) 2 g in sodium chloride 0.9 % 100 mL IVPB        2 g 200 mL/hr over 30 Minutes Intravenous  Once 01/06/21 1506 01/06/21 1546   01/06/21 1515  azithromycin (ZITHROMAX) 500 mg in sodium chloride 0.9 % 250 mL IVPB        500 mg 250 mL/hr over 60 Minutes Intravenous  Once 01/06/21 1506 01/06/21 1645         Medications  Scheduled Meds: . apixaban  5 mg Oral BID  . feeding supplement  237 mL Oral QHS  . furosemide  40 mg Oral Daily  . gabapentin  100 mg Oral TID  . multivitamin with minerals  1 tablet Oral Daily  . nystatin  5 mL Oral QID  . pravastatin  10 mg Oral Daily  . predniSONE  20 mg Oral Q breakfast  . senna  1 tablet Oral QHS  . traMADol  100 mg Oral TID   Continuous Infusions: . sodium chloride 250 mL (01/13/21 1302)   PRN Meds:.sodium chloride, acetaminophen **OR** acetaminophen, hydrALAZINE, ondansetron **OR** ondansetron (ZOFRAN)  IV      Subjective:   01/15/21 was seen and examined today.  No acute complaints except does not like heart healthy diet.  Requesting regular diet.  Awaiting skilled nursing facility.  No acute events overnight.  No fevers.  Objective:  Vitals:   01/21/21 0437 01/21/21 0500 01/21/21 0837 01/21/21 1059  BP: 130/71  132/79 139/77  Pulse: 90  92 97  Resp: 18  18 18   Temp: 97.8 F (36.6 C)  97.9 F (36.6 C) 98 F (36.7 C)  TempSrc: Oral  Oral Oral  SpO2: 94%  94% 94%  Weight:  95.8 kg    Height:        Intake/Output Summary (Last 24 hours) at 01/21/2021 1157 Last data filed at 01/21/2021 1058 Gross per 24 hour  Intake 820 ml  Output 950 ml  Net -130 ml     Wt Readings from Last 3 Encounters:  01/21/21 95.8 kg   Physical Exam  General: Alert and oriented x 3, NAD  Cardiovascular: S1 S2 clear, RRR. No pedal edema b/l  Respiratory: CTA B anteriorly  Gastrointestinal: Soft, nontender, nondistended, NBS  Ext: no pedal edema bilaterally  Neuro: no new deficits  Skin: Chronic lesions on the bilateral legs  Psych: Normal affect and demeanor, alert and oriented x3    Data Reviewed:  I have personally reviewed following labs and imaging studies  Micro Results No results found for this or any previous visit (from the past 240 hour(s)).  Radiology Reports CT Angio Chest PE W and/or Wo Contrast  Addendum Date: 01/06/2021   ADDENDUM REPORT: 01/06/2021 16:38 ADDENDUM: Of note, in the CTA CHEST FINDINGS section, under the Cardiovascular subsection, the original report incorrectly states that there is no pericardial effusion. This section should read that there is a small pericardial effusion. The right report is otherwise unchanged. Electronically Signed   By: 01/08/2021 M.D.   On: 01/06/2021 16:38   Result Date: 01/06/2021 CLINICAL DATA:  Increased shortness of breath for 2 weeks, concern for pulmonary embolism and metastatic disease. EXAM: CT ANGIOGRAPHY  CHEST CT ABDOMEN AND PELVIS WITH CONTRAST TECHNIQUE: Multidetector CT imaging of the chest was performed using the standard protocol during bolus administration of intravenous contrast. Multiplanar CT image reconstructions and MIPs were obtained to evaluate the vascular anatomy. Multidetector CT imaging of the abdomen and pelvis was performed using the standard protocol during bolus administration of intravenous contrast. CONTRAST:  69mL OMNIPAQUE IOHEXOL 350 MG/ML SOLN COMPARISON:  Same day chest radiograph and CT abdomen dated 11/20/2007. FINDINGS: CTA CHEST FINDINGS Cardiovascular: Satisfactory opacification of the pulmonary arteries to the segmental level. Multiple filling defects in segmental and subsegmental pulmonary arteries supplying the right upper lobe and left upper lobe are consistent with pulmonary emboli. The main pulmonary artery is enlarged, measuring 3.3 cm in diameter, suggestive of pulmonary hypertension. Vascular calcifications are seen in the coronary arteries and aortic arch. Normal heart size. No pericardial effusion. Mediastinum/Nodes: Bilateral hilar and mediastinal adenopathy is noted. For reference, a right paratracheal lymph node measures 1.1 cm short axis (series 5, image 26). No enlarged axillary lymph nodes. A left thyroid nodule measures 2.9 x 1.5 cm. The trachea and esophagus demonstrate no significant findings. Lungs/Pleura: Severe diffuse bilateral ground-glass opacities and septal line thickening is seen. There is a small left and trace right pleural effusion with associated atelectasis. There is no pneumothorax. Musculoskeletal: No chest wall abnormality. No acute or significant osseous findings. Review of the MIP images confirms the above findings. CT ABDOMEN and PELVIS FINDINGS Hepatobiliary: An 8 mm cyst is seen in the right hepatic lobe. No gallstones, gallbladder wall thickening, or biliary dilatation. Pancreas: Unremarkable. No pancreatic ductal dilatation or surrounding  inflammatory changes. Spleen: Normal in size without focal abnormality. Adrenals/Urinary Tract:  Adrenal glands are unremarkable. Other than a 2 cm cyst in the left kidney, the kidneys are normal, without renal calculi, focal lesion, or hydronephrosis. Bladder is unremarkable. Stomach/Bowel: There is a small hiatal hernia. Appendix appears normal. No evidence of bowel wall thickening, distention, or inflammatory changes. Vascular/Lymphatic: Aortic atherosclerosis. No enlarged abdominal or pelvic lymph nodes. Reproductive: Uterus and bilateral adnexa are unremarkable. Other: There is a fat containing ventral abdominal wall hernia near the umbilicus. No free intraperitoneal fluid. Musculoskeletal: Degenerative changes are seen in the spine. Review of the MIP images confirms the above findings. IMPRESSION: 1. Multiple filling defects in segmental and subsegmental pulmonary arteries supplying the right upper lobe and left upper lobe, consistent with pulmonary emboli. 2. Severe diffuse bilateral ground-glass opacities and septal line thickening likely represent pulmonary edema/pneumonia. Small left and trace right pleural effusions with associated atelectasis. The previously questioned masslike opacity overlying the right mid and lower lung likely reflected the right atrial silhouette. 3. Bilateral hilar and mediastinal adenopathy is likely reactive. 4. Left thyroid nodule measures 2.9 x 1.5 cm. Recommend thyroid US (ref: J Am Coll Radiol. 2015 Feb;12(2): 143-50). 5. No acute process in the abdomen or pelvis. Aortic Atherosclerosis (ICD10-I70.0). These results were called by telephone at the time of interpretation on 01/06/2021 at 4:10 pm to provider Dr. Dionne Bucy, who verbally acknowledged these results. Electronically Signed: By: Romona Curls M.D. On: 01/06/2021 16:11   CT ABDOMEN PELVIS W CONTRAST  Addendum Date: 01/06/2021   ADDENDUM REPORT: 01/06/2021 16:38 ADDENDUM: Of note, in the CTA CHEST FINDINGS  section, under the Cardiovascular subsection, the original report incorrectly states that there is no pericardial effusion. This section should read that there is a small pericardial effusion. The right report is otherwise unchanged. Electronically Signed   By: Romona Curls M.D.   On: 01/06/2021 16:38   Result Date: 01/06/2021 CLINICAL DATA:  Increased shortness of breath for 2 weeks, concern for pulmonary embolism and metastatic disease. EXAM: CT ANGIOGRAPHY CHEST CT ABDOMEN AND PELVIS WITH CONTRAST TECHNIQUE: Multidetector CT imaging of the chest was performed using the standard protocol during bolus administration of intravenous contrast. Multiplanar CT image reconstructions and MIPs were obtained to evaluate the vascular anatomy. Multidetector CT imaging of the abdomen and pelvis was performed using the standard protocol during bolus administration of intravenous contrast. CONTRAST:  75mL OMNIPAQUE IOHEXOL 350 MG/ML SOLN COMPARISON:  Same day chest radiograph and CT abdomen dated 11/20/2007. FINDINGS: CTA CHEST FINDINGS Cardiovascular: Satisfactory opacification of the pulmonary arteries to the segmental level. Multiple filling defects in segmental and subsegmental pulmonary arteries supplying the right upper lobe and left upper lobe are consistent with pulmonary emboli. The main pulmonary artery is enlarged, measuring 3.3 cm in diameter, suggestive of pulmonary hypertension. Vascular calcifications are seen in the coronary arteries and aortic arch. Normal heart size. No pericardial effusion. Mediastinum/Nodes: Bilateral hilar and mediastinal adenopathy is noted. For reference, a right paratracheal lymph node measures 1.1 cm short axis (series 5, image 26). No enlarged axillary lymph nodes. A left thyroid nodule measures 2.9 x 1.5 cm. The trachea and esophagus demonstrate no significant findings. Lungs/Pleura: Severe diffuse bilateral ground-glass opacities and septal line thickening is seen. There is a small  left and trace right pleural effusion with associated atelectasis. There is no pneumothorax. Musculoskeletal: No chest wall abnormality. No acute or significant osseous findings. Review of the MIP images confirms the above findings. CT ABDOMEN and PELVIS FINDINGS Hepatobiliary: An 8 mm cyst is seen in the right hepatic  lobe. No gallstones, gallbladder wall thickening, or biliary dilatation. Pancreas: Unremarkable. No pancreatic ductal dilatation or surrounding inflammatory changes. Spleen: Normal in size without focal abnormality. Adrenals/Urinary Tract: Adrenal glands are unremarkable. Other than a 2 cm cyst in the left kidney, the kidneys are normal, without renal calculi, focal lesion, or hydronephrosis. Bladder is unremarkable. Stomach/Bowel: There is a small hiatal hernia. Appendix appears normal. No evidence of bowel wall thickening, distention, or inflammatory changes. Vascular/Lymphatic: Aortic atherosclerosis. No enlarged abdominal or pelvic lymph nodes. Reproductive: Uterus and bilateral adnexa are unremarkable. Other: There is a fat containing ventral abdominal wall hernia near the umbilicus. No free intraperitoneal fluid. Musculoskeletal: Degenerative changes are seen in the spine. Review of the MIP images confirms the above findings. IMPRESSION: 1. Multiple filling defects in segmental and subsegmental pulmonary arteries supplying the right upper lobe and left upper lobe, consistent with pulmonary emboli. 2. Severe diffuse bilateral ground-glass opacities and septal line thickening likely represent pulmonary edema/pneumonia. Small left and trace right pleural effusions with associated atelectasis. The previously questioned masslike opacity overlying the right mid and lower lung likely reflected the right atrial silhouette. 3. Bilateral hilar and mediastinal adenopathy is likely reactive. 4. Left thyroid nodule measures 2.9 x 1.5 cm. Recommend thyroid US (ref: J Am Coll Radiol. 2015 Feb;12(2): 143-50).  5. No acute process in the abdomen or pelvis. Aortic Atherosclerosis (ICD10-I70.0). These results were called by telephone at the time of interpretation on 01/06/2021 at 4:10 pm to provider Dr. Dionne Bucy, who verbally acknowledged these results. Electronically Signed: By: Romona Curls M.D. On: 01/06/2021 16:11   US Venous Img Lower Bilateral (DVT)  Result Date: 01/07/2021 CLINICAL DATA:  Pulmonary embolism. EXAM: BILATERAL LOWER EXTREMITY VENOUS DOPPLER ULTRASOUND TECHNIQUE: Gray-scale sonography with compression, as well as color and duplex ultrasound, were performed to evaluate the deep venous system(s) from the level of the common femoral vein through the popliteal and proximal calf veins. COMPARISON:  None. FINDINGS: VENOUS On the right, hypoechoic thrombus in a noncompressible peroneal vein. Normal compressibility of the common femoral, superficial femoral, and popliteal veins. Visualized portions of profunda femoral vein and great saphenous vein unremarkable. No filling defects to suggest DVT on grayscale or color Doppler imaging. On the left, Normal compressibility of the common femoral, superficial femoral, and popliteal veins, as well as the visualized calf veins. Visualized portions of profunda femoral vein and great saphenous vein unremarkable. No filling defects to suggest DVT on grayscale or color Doppler imaging. Doppler waveforms show normal direction of venous flow, normal respiratory phasicity and response to augmentation. OTHER None. Limitations: none IMPRESSION: 1. POSITIVE for isolated right peroneal (calf) DVT. 2. Negative for left lower extremity DVT. Electronically Signed   By: Corlis Leak M.D.   On: 01/07/2021 08:03   DG CHEST PORT 1 VIEW  Result Date: 01/08/2021 CLINICAL DATA:  Increased shortness of breath. EXAM: PORTABLE CHEST 1 VIEW COMPARISON:  01/06/2021 chest radiograph and CT chest. FINDINGS: Patient is rotated. Heart is enlarged, stable. Similar to minimally increased  diffuse mixed interstitial and airspace opacification. There may be a small right pleural effusion. IMPRESSION: 1. Similar to minimally increased diffuse mixed interstitial and airspace opacification. Findings may be due to edema or viral/atypical pneumonia, including due to COVID-19. 2. Probable small right pleural effusion. Electronically Signed   By: Leanna Battles M.D.   On: 01/08/2021 08:54   DG Chest Portable 1 View  Result Date: 01/06/2021 CLINICAL DATA:  Shortness of breath EXAM: PORTABLE CHEST 1 VIEW COMPARISON:  07/06/2014  FINDINGS: Heart size appears within normal limits. Rounded mass-like opacity within the right mid to lower lung field measuring approximately 6.4 cm in diameter. Diffuse interstitial opacities throughout both lungs. Small right pleural effusion. No pneumothorax. IMPRESSION: 1. Rounded mass-like opacity within the right mid to lower lung field measuring approximately 6.4 cm in diameter. Further evaluation with contrast-enhanced CT of the chest is recommended. 2. Extensive bilateral interstitial opacities which may reflect pulmonary edema or possibly atypical/viral infection. 3. Small right pleural effusion. Electronically Signed   By: Duanne Guess D.O.   On: 01/06/2021 14:12   ECHOCARDIOGRAM COMPLETE  Result Date: 01/08/2021    ECHOCARDIOGRAM REPORT   Patient Name:   Elizabeth Rowland Date of Exam: 01/08/2021 Medical Rec #:  [id number]           Height:       66.0 in Accession #:    [id number]          Weight:       219.4 lb Date of Birth:  18-Feb-1941          BSA:          2.080 m Patient Age:    79 years            BP:           158/58 mmHg Patient Gender: F                   HR:           92 bpm. Exam Location:  [facility] Procedure: 2D Echo, Cardiac Doppler and Color Doppler Indications:     Pulmonary embolus I26.09  History:         Patient has no prior history of Echocardiogram examinations. No                  medical history on file.  Sonographer:     Cristela Blue RDCS (AE)  Referring Phys:  [id number] AMY N [facility] Diagnosing Phys: Lorine Bears MD  Sonographer Comments: Technically challenging study due to limited acoustic windows and suboptimal apical window. IMPRESSIONS  1. Left ventricular ejection fraction, by estimation, is 55 to 60%. The left ventricle has normal function. Left ventricular endocardial border not optimally defined to evaluate regional wall motion. Left ventricular diastolic parameters are consistent with Grade I diastolic dysfunction (impaired relaxation).  2. Right ventricular systolic function is normal. The right ventricular size is normal. Tricuspid regurgitation signal is inadequate for assessing PA pressure.  3. Left atrial size was mildly dilated.  4. Right atrial size was mildly dilated.  5. The mitral valve is normal in structure. No evidence of mitral valve regurgitation. No evidence of mitral stenosis.  6. The aortic valve is normal in structure. Aortic valve regurgitation is not visualized. No aortic stenosis is present. FINDINGS  Left Ventricle: Left ventricular ejection fraction, by estimation, is 55 to 60%. The left ventricle has normal function. Left ventricular endocardial border not optimally defined to evaluate regional wall motion. The left ventricular internal cavity size was normal in size. There is no left ventricular hypertrophy. Left ventricular diastolic parameters are consistent with Grade I diastolic dysfunction (impaired relaxation). Right Ventricle: The right ventricular size is normal. No increase in right ventricular wall thickness. Right ventricular systolic function is normal. Tricuspid regurgitation signal is inadequate for assessing PA pressure. Left Atrium: Left atrial size was mildly dilated. Right Atrium: Right atrial size was mildly dilated. Pericardium: There is no evidence of pericardial effusion. Mitral  Valve: The mitral valve is normal in structure. No evidence of mitral valve regurgitation. No evidence of mitral valve  stenosis. Tricuspid Valve: The tricuspid valve is normal in structure. Tricuspid valve regurgitation is not demonstrated. No evidence of tricuspid stenosis. Aortic Valve: The aortic valve is normal in structure. Aortic valve regurgitation is not visualized. No aortic stenosis is present. Aortic valve mean gradient measures 3.0 mmHg. Aortic valve peak gradient measures 5.9 mmHg. Aortic valve area, by VTI measures 3.45 cm. Pulmonic Valve: The pulmonic valve was normal in structure. Pulmonic valve regurgitation is not visualized. No evidence of pulmonic stenosis. Aorta: The aortic root is normal in size and structure. Venous: The inferior vena cava was not well visualized. IAS/Shunts: No atrial level shunt detected by color flow Doppler.  LEFT VENTRICLE PLAX 2D LVIDd:         3.36 cm  Diastology LVIDs:         2.49 cm  LV e' medial:    4.46 cm/s LV PW:         1.08 cm  LV E/e' medial:  20.6 LV IVS:        0.95 cm  LV e' lateral:   6.74 cm/s LVOT diam:     2.00 cm  LV E/e' lateral: 13.6 LV SV:         78 LV SV Index:   37 LVOT Area:     3.14 cm  RIGHT VENTRICLE RV Basal diam:  3.98 cm RV S prime:     12.40 cm/s LEFT ATRIUM             Index       RIGHT ATRIUM           Index LA diam:        4.00 cm 1.92 cm/m  RA Area:     23.30 cm LA Vol (A2C):   54.5 ml 26.20 ml/m RA Volume:   78.60 ml  37.79 ml/m LA Vol (A4C):   56.6 ml 27.21 ml/m LA Biplane Vol: 55.3 ml 26.59 ml/m  AORTIC VALVE                   PULMONIC VALVE AV Area (Vmax):    2.78 cm    PV Vmax:        0.60 m/s AV Area (Vmean):   2.71 cm    PV Peak grad:   1.4 mmHg AV Area (VTI):     3.45 cm    RVOT Peak grad: 4 mmHg AV Vmax:           121.00 cm/s AV Vmean:          86.600 cm/s AV VTI:            0.226 m AV Peak Grad:      5.9 mmHg AV Mean Grad:      3.0 mmHg LVOT Vmax:         107.00 cm/s LVOT Vmean:        74.600 cm/s LVOT VTI:          0.248 m LVOT/AV VTI ratio: 1.10  AORTA Ao Root diam: 2.30 cm MITRAL VALVE                TRICUSPID VALVE MV Area  (PHT): 2.74 cm     TR Peak grad:   13.7 mmHg MV Decel Time: 277 msec     TR Vmax:        185.00 cm/s MV E velocity:  91.70 cm/s MV A velocity: 126.00 cm/s  SHUNTS MV E/A ratio:  0.73         Systemic VTI:  0.25 m                             Systemic Diam: 2.00 cm Lorine Bears MD Electronically signed by Lorine Bears MD Signature Date/Time: 01/08/2021/10:40:18 AM    Final     Lab Data:  CBC: Recent Labs  Lab 01/15/21 1610 01/17/21 0423 01/18/21 0411 01/19/21 0654 01/20/21 0612  WBC 20.6* 32.2* 31.6* 32.0* 28.5*  NEUTROABS 15.7* 20.0*  --   --   --   HGB 15.2* 15.5* 15.5* 15.7* 15.5*  HCT 48.3* 49.1* 48.6* 49.3* 48.7*  MCV 92.9 93.5 93.6 93.0 92.8  PLT 141* 159 167 172 164   Basic Metabolic Panel: Recent Labs  Lab 01/17/21 0423 01/18/21 0411 01/19/21 0654 01/20/21 0612  NA 135 140 137 137  K 3.9 3.5 3.7 3.4*  CL 92* 94* 93* 94*  CO2 33* 33* 33* 33*  GLUCOSE 169* 136* 131* 115*  BUN 32* 35* 34* 31*  CREATININE 0.71 0.62 0.81 0.72  CALCIUM 9.1 9.4 8.9 8.7*  MG 2.1  --   --   --    GFR: Estimated Creatinine Clearance: 64.3 mL/min (by C-G formula based on SCr of 0.72 mg/dL). Liver Function Tests: No results for input(s): AST, ALT, ALKPHOS, BILITOT, PROT, ALBUMIN in the last 168 hours. No results for input(s): LIPASE, AMYLASE in the last 168 hours. No results for input(s): AMMONIA in the last 168 hours. Coagulation Profile: No results for input(s): INR, PROTIME in the last 168 hours. Cardiac Enzymes: No results for input(s): CKTOTAL, CKMB, CKMBINDEX, TROPONINI in the last 168 hours. BNP (last 3 results) No results for input(s): PROBNP in the last 8760 hours. HbA1C: No results for input(s): HGBA1C in the last 72 hours. CBG: No results for input(s): GLUCAP in the last 168 hours. Lipid Profile: No results for input(s): CHOL, HDL, LDLCALC, TRIG, CHOLHDL, LDLDIRECT in the last 72 hours. Thyroid Function Tests: No results for input(s): TSH, T4TOTAL, FREET4, T3FREE,  THYROIDAB in the last 72 hours. Anemia Panel: No results for input(s): VITAMINB12, FOLATE, FERRITIN, TIBC, IRON, RETICCTPCT in the last 72 hours. Urine analysis:    Component Value Date/Time   COLORURINE AMBER (A) 01/06/2021 0723   APPEARANCEUR CLEAR (A) 01/06/2021 0723   LABSPEC >1.046 (H) 01/06/2021 0723   PHURINE 6.0 01/06/2021 0723   GLUCOSEU NEGATIVE 01/06/2021 0723   HGBUR NEGATIVE 01/06/2021 0723   BILIRUBINUR (A) 01/06/2021 0723    TEST NOT REPORTED DUE TO COLOR INTERFERENCE OF URINE PIGMENT   KETONESUR NEGATIVE 01/06/2021 0723   PROTEINUR NEGATIVE 01/06/2021 0723   NITRITE (A) 01/06/2021 0723    TEST NOT REPORTED DUE TO COLOR INTERFERENCE OF URINE PIGMENT   LEUKOCYTESUR NEGATIVE 01/06/2021 0723     Aerica Rincon M.D. [facility] 01/21/2021, 11:57 AM  Available via Epic secure chat 7am-7pm After 7 pm, please refer to night coverage provider listed on amion.

## 2021-01-22 LAB — RESP PANEL BY RT-PCR (FLU A&B, COVID) ARPGX2
Influenza A by PCR: NEGATIVE
Influenza B by PCR: NEGATIVE
SARS Coronavirus 2 by RT PCR: NEGATIVE

## 2021-01-22 LAB — BASIC METABOLIC PANEL
Anion gap: 10 (ref 5–15)
BUN: 27 mg/dL — ABNORMAL HIGH (ref 8–23)
CO2: 32 mmol/L (ref 22–32)
Calcium: 8.5 mg/dL — ABNORMAL LOW (ref 8.9–10.3)
Chloride: 94 mmol/L — ABNORMAL LOW (ref 98–111)
Creatinine, Ser: 0.71 mg/dL (ref 0.44–1.00)
GFR, Estimated: >60 mL/min (ref >60)
Glucose, Bld: 118 mg/dL — ABNORMAL HIGH (ref 70–99)
Potassium: 3.2 mmol/L — ABNORMAL LOW (ref 3.5–5.1)
Sodium: 136 mmol/L (ref 135–145)

## 2021-01-22 LAB — CBC
HCT: 47.7 % — ABNORMAL HIGH (ref 36.0–46.0)
Hemoglobin: 15.4 g/dL — ABNORMAL HIGH (ref 12.0–15.0)
MCH: 29.9 pg (ref 26.0–34.0)
MCHC: 32.3 g/dL (ref 30.0–36.0)
MCV: 92.6 fL (ref 80.0–100.0)
Platelets: 147 10*3/uL — ABNORMAL LOW (ref 150–400)
RBC: 5.15 MIL/uL — ABNORMAL HIGH (ref 3.87–5.11)
RDW: 15.5 % (ref 11.5–15.5)
WBC: 29.8 10*3/uL — ABNORMAL HIGH (ref 4.0–10.5)
nRBC: 0 % (ref 0.0–0.2)

## 2021-01-22 MED ORDER — TRAMADOL HCL 50 MG PO TABS: 100.0000 mg | ORAL_TABLET | Freq: Three times a day (TID) | ORAL | 0 refills | Status: DC

## 2021-01-22 MED ORDER — POTASSIUM CHLORIDE CRYS ER 20 MEQ PO TBCR
40.0000 meq | EXTENDED_RELEASE_TABLET | Freq: Once | ORAL | Status: AC
  Administered 2021-01-22: 40 meq via ORAL
  Filled 2021-01-22: qty 2

## 2021-01-22 MED ORDER — POTASSIUM CHLORIDE CRYS ER 20 MEQ PO TBCR: 20.0000 meq | EXTENDED_RELEASE_TABLET | Freq: Every day | ORAL | 0 refills | Status: DC

## 2021-01-22 MED ORDER — PREDNISONE 10 MG PO TABS: ORAL_TABLET | ORAL | Status: DC

## 2021-01-22 NOTE — Progress Notes (Signed)
Physical Therapy Treatment Patient Details Name: Elizabeth Rowland MRN: [id number] DOB: [birth year] Today's Date: 01/22/2021    History of Present Illness 80 y.o. female with medical history significant for fibromyalgia, history of hypertension, history of hyperlipidemia, currently on tramadol and gabapentin for chronic pain and fibromyalgia, no longer taking antihypertensives for pravastatin, presents to the emergency department for chief concerns of shortness of breath.  Found to have  acute respiratory failure, ARDS. CT angiogram showed multiple PE with extensive groundglass opacities as well as interstitial changes. COVID PCR negative    PT Comments    Patient agreeable to PT session. Patient has increased sitting tolerance of ~ 8 minutes this session without dizziness reported. Sp02 decreased down to 85% initially on 2 L02 with seated level activity and increased to 90% with cues for breathing techniques. Patient continues to require maximal assistance for bed mobility and was unable to stand despite maximal assistance and cues for standing techniques.  Recommend to continue PT to maximize independence and decrease caregiver burden. SNF remains the most appropriate discharge plan at this time.    Follow Up Recommendations  SNF     Equipment Recommendations  Other (comment) (to be determined at next level of care)    Recommendations for Other Services       Precautions / Restrictions Precautions Precautions: Fall Restrictions Weight Bearing Restrictions: No    Mobility  Bed Mobility Overal bed mobility: Needs Assistance Bed Mobility: Supine to Sit;Sit to Supine Rolling: Mod assist   Supine to sit: Max assist Sit to supine: Max assist   General bed mobility comments: assistance for trunk and BLE support. verbal cues for technique and sequencing. increased time required to complete tasks due to fatigue and generalized weakness    Transfers Overall transfer level: Needs  assistance               General transfer comment: attempted sit to stand x 2 bouts with maximal assistance and verbal cues for technique. patient with minimal effort provided with standing attempts despite encouragement. unable to stand at this time  Ambulation/Gait                 Stairs             Wheelchair Mobility    Modified Rankin (Stroke Patients Only)       Balance Overall balance assessment: Needs assistance Sitting-balance support: Single extremity supported Sitting balance-Leahy Scale: Fair                                      Cognition Arousal/Alertness: Awake/alert Behavior During Therapy: Flat affect Overall Cognitive Status: Within Functional Limits for tasks assessed                                        Exercises      General Comments        Pertinent Vitals/Pain Pain Assessment: No/denies pain    Home Living                      Prior Function            PT Goals (current goals can now be found in the care plan section) Acute Rehab PT Goals Patient Stated Goal: to go home PT Goal Formulation: With patient Time  For Goal Achievement: 01/25/21 Potential to Achieve Goals: Fair Progress towards PT goals: Progressing toward goals    Frequency    Min 2X/week      PT Plan Current plan remains appropriate    Co-evaluation              AM-PAC PT "6 Clicks" Mobility   Outcome Measure  Help needed turning from your back to your side while in a flat bed without using bedrails?: A Lot Help needed moving from lying on your back to sitting on the side of a flat bed without using bedrails?: A Lot Help needed moving to and from a bed to a chair (including a wheelchair)?: A Lot Help needed standing up from a chair using your arms (e.g., wheelchair or bedside chair)?: A Lot Help needed to walk in hospital room?: Total Help needed climbing 3-5 steps with a railing? : Total 6  Click Score: 10    End of Session Equipment Utilized During Treatment: Oxygen Activity Tolerance: Patient limited by fatigue Patient left: in bed;with call bell/phone within reach;with bed alarm set Nurse Communication: Mobility status PT Visit Diagnosis: Muscle weakness (generalized) (M62.81);Unsteadiness on feet (R26.81);Difficulty in walking, not elsewhere classified (R26.2)     Time: 8101-7510 PT Time Calculation (min) (ACUTE ONLY): 25 min  Charges:  $Therapeutic Activity: 23-37 mins                     Donna Bernard, PT, MPT    Ina [employer] 01/22/2021, 12:43 PM

## 2021-01-22 NOTE — Discharge Summary (Signed)
Physician Discharge Summary   Patient ID: Elizabeth Rowland MRN: [id number] DOB/AGE: 82-Feb-1942 82 y.o.  Admit date: 01/06/2021 Discharge date: 01/22/2021  Primary Care Physician:  Leanna Sato, MD   Recommendations for Outpatient Follow-up:  1. Follow up with PCP in 1-2 weeks 2. Please obtain BMP in one week  3. Patient needs to be on oxygen 2 L, continuous 4. Outpatient follow-up and work-up for thyroid nodule 5. Recommend palliative medicine to follow at the facility  Home Health: Patient going to skilled nursing facility Equipment/Devices:   Discharge Condition: stable  CODE STATUS: FULL   Diet recommendation: Regular diet with low salt    Discharge Diagnoses:    . 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 Hyperlipidemia Bilateral pulmonary embolism, right peroneal DVT Leukocytosis, likely reactive Hyperlipidemia Oral thrush Demand ischemia with elevated troponin likely due to severe sepsis, hypoxia Right mid to lower lung mass like opacity on the chest x-ray Thyroid nodule Mild protein calorie malnutrition due to inadequate oral intake Obesity  Consults:  Pulmonology Palliative medicine    Allergies:  Not on File   DISCHARGE MEDICATIONS: Allergies as of 01/22/2021   Not on File     Medication List    STOP taking these medications   Acetaminophen 500 MG capsule     TAKE these medications   apixaban 5 MG Tabs tablet Commonly known as: ELIQUIS Take 1 tablet (5 mg total) by mouth 2 (two) times daily.   furosemide 40 MG tablet Commonly known as: Lasix Take 1 tablet (40 mg total) by mouth daily.   gabapentin 300 MG capsule Commonly known as: NEURONTIN Take 1 capsule by mouth 3 (three) times daily.   multivitamin with minerals Tabs tablet Take 1 tablet by mouth daily.   nystatin 100000 UNIT/ML suspension Commonly known as: MYCOSTATIN Take 5 mLs  (500,000 Units total) by mouth 4 (four) times daily. X 10 days   potassium chloride SA 20 MEQ tablet Commonly known as: KLOR-CON Take 1 tablet (20 mEq total) by mouth daily.   pravastatin 10 MG tablet Commonly known as: PRAVACHOL Take 10 mg by mouth daily.   predniSONE 10 MG tablet Commonly known as: DELTASONE Take prednisone 20mg  PO daily for 1 more day, then taper to 10mg  daily for 3 days, then OFF   traMADol 50 MG tablet Commonly known as: ULTRAM Take 2 tablets (100 mg total) by mouth in the morning, at noon, and at bedtime. What changed: Another medication with the same name was removed. Continue taking this medication, and follow the directions you see here.        Brief H and P: For complete details please refer to admission H and P, but in brief  Patient is a 80 year old female with history of hypertension, hyperlipidemia, fibromyalgia presented to the hospital for shortness of breath, was hypoxic in ED and requiring 10 L O2.  Patient was found to have severe sepsis secondary to pneumonia, acute pulmonary embolism and acute hypoxic respiratory failure.  She also had ARDS, test for COVID-19 were negative.  Patient was treated with empiric IV antibiotics, IV heparin, IV steroids and IV Lasix.  Subsequently transitioned to oral Eliquis for long-term anticoagulation.  Currently off of HFNC however still on 6 L O2 via [place], weaning down. Pulmonology consulted.  She has a thyroid nodule and outpatient follow-up is recommended for further evaluation. She also had a right mid to lower lobe mass-like opacity  on chest x-ray but this was not evident on CT chest. Outpatient follow-up is recommended for this as well. She was evaluated by PT and OT recommended discharge to SNF.  Hospital Course:   Severe sepsis with acute organ dysfunction (HCC), acute respiratory failure with hypoxia, ARDS -Much improved, weaned off of HFNC, continues to improve, O2 sats 95% on 2 L -Completed 8 days  of IV antibiotics on 01/13/2021. -Completed 3-day course of high-dose steroids on 4/27, was resumed on low-dose IV steroids on 5/1.  - -Prednisone now tapered to 20 mg daily.  Patient was followed closely by pulmonology.  Continue prednisone 20 mg daily for 1 more day and then taper to 10 mg daily for 3 days then off. 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  -Creatinine stable, negative balance of 8.4 L - Follow renal function    Hyperlipidemia -Continue pravastatin  Leukocytosis -Likely reactive or related to steroids, no fevers  Bilateral pulmonary embolism, right peroneal DVT -H&H stable, continue eliquis  Hypokalemia -Replace  Oral thrush -Continue nystatin  Demand ischemia with elevated troponin likely due to severe sepsis, hypoxia, mild volume overload -Currently no chest pain or any worsening cardiac symptoms  Right mid to lower lung mass like opacity on chest x-ray -Incidently seen on chest x-ray but not seen on CT chest.  Outpatient follow-up recommended  Thyroid nodule -Outpatient follow-up recommended  Chronic pain/fibromyalgia Continue tramadol 100mg  TID per home regimen     Mild protein calorie malnutrition due to inadequate oral intake, acute on -Dietitian following, continue nutritional supplements  Obesity Estimated body mass index is 34.09 kg/m as calculated from the following:   Height as of this encounter: 5\' 6"  (1.676 m).   Weight as of this encounter: 95.8 kg.   Goals of care, generalized debility -Patient and son agreeable for SNF     Day of Discharge S: No acute complaints, O2 sats 95% on 2 L.  No fevers or chills, no acute shortness of breath or chest pain.  BP 129/60 (BP Location: Left Arm)   Pulse 97    Temp 97.6 F (36.4 C) (Oral)   Resp 18   Ht 5\' 6"  (1.676 m)   Wt 96.4 kg   SpO2 95%   BMI 34.30 kg/m   Physical Exam: General: Alert and awake oriented x3 not in any acute distress. CVS: S1-S2 clear no murmur rubs or gallops Chest: clear to auscultation bilaterally, no wheezing rales or rhonchi Abdomen: soft nontender, nondistended, normal bowel sounds Extremities: Chronic lesions on the bilateral legs     Get Medicines reviewed and adjusted: Please take all your medications with you for your next visit with your Primary MD  Please request your Primary MD to go over all hospital tests and procedure/radiological results at the follow up. Please ask your Primary MD to get all Hospital records sent to his/her office.  If you experience worsening of your admission symptoms, develop shortness of breath, life threatening emergency, suicidal or homicidal thoughts you must seek medical attention immediately by calling 911 or calling your MD immediately  if symptoms less severe.  You must read complete instructions/literature along with all the possible adverse reactions/side effects for all the Medicines you take and that have been prescribed to you.  Take any new Medicines after you have completely understood and accept all the possible adverse reactions/side effects.   Do not drive when taking pain medications.   Do not take more than prescribed Pain, Sleep and Anxiety Medications  Special Instructions: If you have smoked or chewed Tobacco  in the last 2 yrs please stop smoking, stop any regular Alcohol  and or any Recreational drug use.  Wear Seat belts while driving.  Please note  You were cared for by a hospitalist during your hospital stay. Once you are discharged, your primary care physician will handle any further medical issues. Please note that NO REFILLS for any discharge medications will be authorized once you are discharged, as it is imperative that you return to your  primary care physician (or establish a relationship with a primary care physician if you do not have one) for your aftercare needs so that they can reassess your need for medications and monitor your lab values.   The results of significant diagnostics from this hospitalization (including imaging, microbiology, ancillary and laboratory) are listed below for reference.      Procedures/Studies:  CT Angio Chest PE W and/or Wo Contrast  Addendum Date: 01/06/2021   ADDENDUM REPORT: 01/06/2021 16:38 ADDENDUM: Of note, in the CTA CHEST FINDINGS section, under the Cardiovascular subsection, the original report incorrectly states that there is no pericardial effusion. This section should read that there is a small pericardial effusion. The right report is otherwise unchanged. Electronically Signed   By: Romona Curls M.D.   On: 01/06/2021 16:38   Result Date: 01/06/2021 CLINICAL DATA:  Increased shortness of breath for 2 weeks, concern for pulmonary embolism and metastatic disease. EXAM: CT ANGIOGRAPHY CHEST CT ABDOMEN AND PELVIS WITH CONTRAST TECHNIQUE: Multidetector CT imaging of the chest was performed using the standard protocol during bolus administration of intravenous contrast. Multiplanar CT image reconstructions and MIPs were obtained to evaluate the vascular anatomy. Multidetector CT imaging of the abdomen and pelvis was performed using the standard protocol during bolus administration of intravenous contrast. CONTRAST:  75mL OMNIPAQUE IOHEXOL 350 MG/ML SOLN COMPARISON:  Same day chest radiograph and CT abdomen dated 11/20/2007. FINDINGS: CTA CHEST FINDINGS Cardiovascular: Satisfactory opacification of the pulmonary arteries to the segmental level. Multiple filling defects in segmental and subsegmental pulmonary arteries supplying the right upper lobe and left upper lobe are consistent with pulmonary emboli. The main pulmonary artery is enlarged, measuring 3.3 cm in diameter, suggestive of pulmonary  hypertension. Vascular calcifications are seen in the coronary arteries and aortic arch. Normal heart size. No pericardial effusion. Mediastinum/Nodes: Bilateral hilar and mediastinal adenopathy is noted. For reference, a right paratracheal lymph node measures 1.1 cm short axis (series 5, image 26). No enlarged axillary lymph nodes. A left thyroid nodule measures 2.9 x 1.5 cm. The trachea and esophagus demonstrate no significant findings. Lungs/Pleura: Severe diffuse bilateral ground-glass opacities and septal line thickening is seen. There is a small left and trace right pleural effusion with associated atelectasis. There is no pneumothorax. Musculoskeletal: No chest wall abnormality. No acute or significant osseous findings. Review of the MIP images confirms the above findings. CT ABDOMEN and PELVIS FINDINGS Hepatobiliary: An 8 mm cyst is seen in the right hepatic lobe. No gallstones, gallbladder wall thickening, or biliary dilatation. Pancreas: Unremarkable. No pancreatic ductal dilatation or surrounding inflammatory changes. Spleen: Normal in size without focal abnormality. Adrenals/Urinary Tract: Adrenal glands are unremarkable. Other than a 2 cm cyst in the left kidney, the kidneys are normal, without renal  calculi, focal lesion, or hydronephrosis. Bladder is unremarkable. Stomach/Bowel: There is a small hiatal hernia. Appendix appears normal. No evidence of bowel wall thickening, distention, or inflammatory changes. Vascular/Lymphatic: Aortic atherosclerosis. No enlarged abdominal or pelvic lymph nodes. Reproductive: Uterus and bilateral adnexa are unremarkable. Other: There is a fat containing ventral abdominal wall hernia near the umbilicus. No free intraperitoneal fluid. Musculoskeletal: Degenerative changes are seen in the spine. Review of the MIP images confirms the above findings. IMPRESSION: 1. Multiple filling defects in segmental and subsegmental pulmonary arteries supplying the right upper lobe and  left upper lobe, consistent with pulmonary emboli. 2. Severe diffuse bilateral ground-glass opacities and septal line thickening likely represent pulmonary edema/pneumonia. Small left and trace right pleural effusions with associated atelectasis. The previously questioned masslike opacity overlying the right mid and lower lung likely reflected the right atrial silhouette. 3. Bilateral hilar and mediastinal adenopathy is likely reactive. 4. Left thyroid nodule measures 2.9 x 1.5 cm. Recommend thyroid US (ref: J Am Coll Radiol. 2015 Feb;12(2): 143-50). 5. No acute process in the abdomen or pelvis. Aortic Atherosclerosis (ICD10-I70.0). These results were called by telephone at the time of interpretation on 01/06/2021 at 4:10 pm to provider Dr. Dionne Bucy, who verbally acknowledged these results. Electronically Signed: By: Romona Curls M.D. On: 01/06/2021 16:11   CT ABDOMEN PELVIS W CONTRAST  Addendum Date: 01/06/2021   ADDENDUM REPORT: 01/06/2021 16:38 ADDENDUM: Of note, in the CTA CHEST FINDINGS section, under the Cardiovascular subsection, the original report incorrectly states that there is no pericardial effusion. This section should read that there is a small pericardial effusion. The right report is otherwise unchanged. Electronically Signed   By: Romona Curls M.D.   On: 01/06/2021 16:38   Result Date: 01/06/2021 CLINICAL DATA:  Increased shortness of breath for 2 weeks, concern for pulmonary embolism and metastatic disease. EXAM: CT ANGIOGRAPHY CHEST CT ABDOMEN AND PELVIS WITH CONTRAST TECHNIQUE: Multidetector CT imaging of the chest was performed using the standard protocol during bolus administration of intravenous contrast. Multiplanar CT image reconstructions and MIPs were obtained to evaluate the vascular anatomy. Multidetector CT imaging of the abdomen and pelvis was performed using the standard protocol during bolus administration of intravenous contrast. CONTRAST:  75mL OMNIPAQUE IOHEXOL  350 MG/ML SOLN COMPARISON:  Same day chest radiograph and CT abdomen dated 11/20/2007. FINDINGS: CTA CHEST FINDINGS Cardiovascular: Satisfactory opacification of the pulmonary arteries to the segmental level. Multiple filling defects in segmental and subsegmental pulmonary arteries supplying the right upper lobe and left upper lobe are consistent with pulmonary emboli. The main pulmonary artery is enlarged, measuring 3.3 cm in diameter, suggestive of pulmonary hypertension. Vascular calcifications are seen in the coronary arteries and aortic arch. Normal heart size. No pericardial effusion. Mediastinum/Nodes: Bilateral hilar and mediastinal adenopathy is noted. For reference, a right paratracheal lymph node measures 1.1 cm short axis (series 5, image 26). No enlarged axillary lymph nodes. A left thyroid nodule measures 2.9 x 1.5 cm. The trachea and esophagus demonstrate no significant findings. Lungs/Pleura: Severe diffuse bilateral ground-glass opacities and septal line thickening is seen. There is a small left and trace right pleural effusion with associated atelectasis. There is no pneumothorax. Musculoskeletal: No chest wall abnormality. No acute or significant osseous findings. Review of the MIP images confirms the above findings. CT ABDOMEN and PELVIS FINDINGS Hepatobiliary: An 8 mm cyst is seen in the right hepatic lobe. No gallstones, gallbladder wall thickening, or biliary dilatation. Pancreas: Unremarkable. No pancreatic ductal dilatation or surrounding inflammatory changes. Spleen:  Normal in size without focal abnormality. Adrenals/Urinary Tract: Adrenal glands are unremarkable. Other than a 2 cm cyst in the left kidney, the kidneys are normal, without renal calculi, focal lesion, or hydronephrosis. Bladder is unremarkable. Stomach/Bowel: There is a small hiatal hernia. Appendix appears normal. No evidence of bowel wall thickening, distention, or inflammatory changes. Vascular/Lymphatic: Aortic  atherosclerosis. No enlarged abdominal or pelvic lymph nodes. Reproductive: Uterus and bilateral adnexa are unremarkable. Other: There is a fat containing ventral abdominal wall hernia near the umbilicus. No free intraperitoneal fluid. Musculoskeletal: Degenerative changes are seen in the spine. Review of the MIP images confirms the above findings. IMPRESSION: 1. Multiple filling defects in segmental and subsegmental pulmonary arteries supplying the right upper lobe and left upper lobe, consistent with pulmonary emboli. 2. Severe diffuse bilateral ground-glass opacities and septal line thickening likely represent pulmonary edema/pneumonia. Small left and trace right pleural effusions with associated atelectasis. The previously questioned masslike opacity overlying the right mid and lower lung likely reflected the right atrial silhouette. 3. Bilateral hilar and mediastinal adenopathy is likely reactive. 4. Left thyroid nodule measures 2.9 x 1.5 cm. Recommend thyroid US (ref: J Am Coll Radiol. 2015 Feb;12(2): 143-50). 5. No acute process in the abdomen or pelvis. Aortic Atherosclerosis (ICD10-I70.0). These results were called by telephone at the time of interpretation on 01/06/2021 at 4:10 pm to provider Dr. Dionne Bucy, who verbally acknowledged these results. Electronically Signed: By: Romona Curls M.D. On: 01/06/2021 16:11   US Venous Img Lower Bilateral (DVT)  Result Date: 01/07/2021 CLINICAL DATA:  Pulmonary embolism. EXAM: BILATERAL LOWER EXTREMITY VENOUS DOPPLER ULTRASOUND TECHNIQUE: Gray-scale sonography with compression, as well as color and duplex ultrasound, were performed to evaluate the deep venous system(s) from the level of the common femoral vein through the popliteal and proximal calf veins. COMPARISON:  None. FINDINGS: VENOUS On the right, hypoechoic thrombus in a noncompressible peroneal vein. Normal compressibility of the common femoral, superficial femoral, and popliteal veins.  Visualized portions of profunda femoral vein and great saphenous vein unremarkable. No filling defects to suggest DVT on grayscale or color Doppler imaging. On the left, Normal compressibility of the common femoral, superficial femoral, and popliteal veins, as well as the visualized calf veins. Visualized portions of profunda femoral vein and great saphenous vein unremarkable. No filling defects to suggest DVT on grayscale or color Doppler imaging. Doppler waveforms show normal direction of venous flow, normal respiratory phasicity and response to augmentation. OTHER None. Limitations: none IMPRESSION: 1. POSITIVE for isolated right peroneal (calf) DVT. 2. Negative for left lower extremity DVT. Electronically Signed   By: Corlis Leak M.D.   On: 01/07/2021 08:03   DG CHEST PORT 1 VIEW  Result Date: 01/08/2021 CLINICAL DATA:  Increased shortness of breath. EXAM: PORTABLE CHEST 1 VIEW COMPARISON:  01/06/2021 chest radiograph and CT chest. FINDINGS: Patient is rotated. Heart is enlarged, stable. Similar to minimally increased diffuse mixed interstitial and airspace opacification. There may be a small right pleural effusion. IMPRESSION: 1. Similar to minimally increased diffuse mixed interstitial and airspace opacification. Findings may be due to edema or viral/atypical pneumonia, including due to COVID-19. 2. Probable small right pleural effusion. Electronically Signed   By: Leanna Battles M.D.   On: 01/08/2021 08:54   DG Chest Portable 1 View  Result Date: 01/06/2021 CLINICAL DATA:  Shortness of breath EXAM: PORTABLE CHEST 1 VIEW COMPARISON:  07/06/2014 FINDINGS: Heart size appears within normal limits. Rounded mass-like opacity within the right mid to lower lung field measuring approximately  6.4 cm in diameter. Diffuse interstitial opacities throughout both lungs. Small right pleural effusion. No pneumothorax. IMPRESSION: 1. Rounded mass-like opacity within the right mid to lower lung field measuring  approximately 6.4 cm in diameter. Further evaluation with contrast-enhanced CT of the chest is recommended. 2. Extensive bilateral interstitial opacities which may reflect pulmonary edema or possibly atypical/viral infection. 3. Small right pleural effusion. Electronically Signed   By: Duanne Guess D.O.   On: 01/06/2021 14:12   ECHOCARDIOGRAM COMPLETE  Result Date: 01/08/2021    ECHOCARDIOGRAM REPORT   Patient Name:   Elizabeth Rowland Dohrman Date of Exam: 01/08/2021 Medical Rec #:  [id number]           Height:       66.0 in Accession #:    [id number]          Weight:       219.4 lb Date of Birth:  Apr 30, 1941          BSA:          2.080 m Patient Age:    79 years            BP:           158/58 mmHg Patient Gender: F                   HR:           92 bpm. Exam Location:  [facility] Procedure: 2D Echo, Cardiac Doppler and Color Doppler Indications:     Pulmonary embolus I26.09  History:         Patient has no prior history of Echocardiogram examinations. No                  medical history on file.  Sonographer:     Cristela Blue RDCS (AE) Referring Phys:  [id number] AMY N [facility] Diagnosing Phys: Lorine Bears MD  Sonographer Comments: Technically challenging study due to limited acoustic windows and suboptimal apical window. IMPRESSIONS  1. Left ventricular ejection fraction, by estimation, is 55 to 60%. The left ventricle has normal function. Left ventricular endocardial border not optimally defined to evaluate regional wall motion. Left ventricular diastolic parameters are consistent with Grade I diastolic dysfunction (impaired relaxation).  2. Right ventricular systolic function is normal. The right ventricular size is normal. Tricuspid regurgitation signal is inadequate for assessing PA pressure.  3. Left atrial size was mildly dilated.  4. Right atrial size was mildly dilated.  5. The mitral valve is normal in structure. No evidence of mitral valve regurgitation. No evidence of mitral stenosis.  6. The aortic valve is  normal in structure. Aortic valve regurgitation is not visualized. No aortic stenosis is present. FINDINGS  Left Ventricle: Left ventricular ejection fraction, by estimation, is 55 to 60%. The left ventricle has normal function. Left ventricular endocardial border not optimally defined to evaluate regional wall motion. The left ventricular internal cavity size was normal in size. There is no left ventricular hypertrophy. Left ventricular diastolic parameters are consistent with Grade I diastolic dysfunction (impaired relaxation). Right Ventricle: The right ventricular size is normal. No increase in right ventricular wall thickness. Right ventricular systolic function is normal. Tricuspid regurgitation signal is inadequate for assessing PA pressure. Left Atrium: Left atrial size was mildly dilated. Right Atrium: Right atrial size was mildly dilated. Pericardium: There is no evidence of pericardial effusion. Mitral Valve: The mitral valve is normal in structure. No evidence of mitral valve regurgitation. No evidence of mitral valve  stenosis. Tricuspid Valve: The tricuspid valve is normal in structure. Tricuspid valve regurgitation is not demonstrated. No evidence of tricuspid stenosis. Aortic Valve: The aortic valve is normal in structure. Aortic valve regurgitation is not visualized. No aortic stenosis is present. Aortic valve mean gradient measures 3.0 mmHg. Aortic valve peak gradient measures 5.9 mmHg. Aortic valve area, by VTI measures 3.45 cm. Pulmonic Valve: The pulmonic valve was normal in structure. Pulmonic valve regurgitation is not visualized. No evidence of pulmonic stenosis. Aorta: The aortic root is normal in size and structure. Venous: The inferior vena cava was not well visualized. IAS/Shunts: No atrial level shunt detected by color flow Doppler.  LEFT VENTRICLE PLAX 2D LVIDd:         3.36 cm  Diastology LVIDs:         2.49 cm  LV e' medial:    4.46 cm/s LV PW:         1.08 cm  LV E/e' medial:  20.6  LV IVS:        0.95 cm  LV e' lateral:   6.74 cm/s LVOT diam:     2.00 cm  LV E/e' lateral: 13.6 LV SV:         78 LV SV Index:   37 LVOT Area:     3.14 cm  RIGHT VENTRICLE RV Basal diam:  3.98 cm RV S prime:     12.40 cm/s LEFT ATRIUM             Index       RIGHT ATRIUM           Index LA diam:        4.00 cm 1.92 cm/m  RA Area:     23.30 cm LA Vol (A2C):   54.5 ml 26.20 ml/m RA Volume:   78.60 ml  37.79 ml/m LA Vol (A4C):   56.6 ml 27.21 ml/m LA Biplane Vol: 55.3 ml 26.59 ml/m  AORTIC VALVE                   PULMONIC VALVE AV Area (Vmax):    2.78 cm    PV Vmax:        0.60 m/s AV Area (Vmean):   2.71 cm    PV Peak grad:   1.4 mmHg AV Area (VTI):     3.45 cm    RVOT Peak grad: 4 mmHg AV Vmax:           121.00 cm/s AV Vmean:          86.600 cm/s AV VTI:            0.226 m AV Peak Grad:      5.9 mmHg AV Mean Grad:      3.0 mmHg LVOT Vmax:         107.00 cm/s LVOT Vmean:        74.600 cm/s LVOT VTI:          0.248 m LVOT/AV VTI ratio: 1.10  AORTA Ao Root diam: 2.30 cm MITRAL VALVE                TRICUSPID VALVE MV Area (PHT): 2.74 cm     TR Peak grad:   13.7 mmHg MV Decel Time: 277 msec     TR Vmax:        185.00 cm/s MV E velocity: 91.70 cm/s MV A velocity: 126.00 cm/s  SHUNTS MV E/A ratio:  0.73  Systemic VTI:  0.25 m                             Systemic Diam: 2.00 cm Lorine Bears MD Electronically signed by Lorine Bears MD Signature Date/Time: 01/08/2021/10:40:18 AM    Final       LAB RESULTS: Basic Metabolic Panel: Recent Labs  Lab 01/17/21 0423 01/18/21 0411 01/20/21 0612 01/22/21 0624  NA 135   < > 137 136  K 3.9   < > 3.4* 3.2*  CL 92*   < > 94* 94*  CO2 33*   < > 33* 32  GLUCOSE 169*   < > 115* 118*  BUN 32*   < > 31* 27*  CREATININE 0.71   < > 0.72 0.71  CALCIUM 9.1   < > 8.7* 8.5*  MG 2.1  --   --   --    < > = values in this interval not displayed.   Liver Function Tests: No results for input(s): AST, ALT, ALKPHOS, BILITOT, PROT, ALBUMIN in the last 168  hours. No results for input(s): LIPASE, AMYLASE in the last 168 hours. No results for input(s): AMMONIA in the last 168 hours. CBC: Recent Labs  Lab 01/17/21 0423 01/18/21 0411 01/20/21 0612 01/22/21 0624  WBC 32.2*   < > 28.5* 29.8*  NEUTROABS 20.0*  --   --   --   HGB 15.5*   < > 15.5* 15.4*  HCT 49.1*   < > 48.7* 47.7*  MCV 93.5   < > 92.8 92.6  PLT 159   < > 164 147*   < > = values in this interval not displayed.   Cardiac Enzymes: No results for input(s): CKTOTAL, CKMB, CKMBINDEX, TROPONINI in the last 168 hours. BNP: Invalid input(s): POCBNP CBG: No results for input(s): GLUCAP in the last 168 hours.     Disposition and Follow-up: Discharge Instructions    (HEART FAILURE PATIENTS) Call MD:  Anytime you have any of the following symptoms: 1) 3 pound weight gain in 24 hours or 5 pounds in 1 week 2) shortness of breath, with or without a dry hacking cough 3) swelling in the hands, feet or stomach 4) if you have to sleep on extra pillows at night in order to breathe.   Complete by: As directed    Diet - low sodium heart healthy   Complete by: As directed    Increase activity slowly   Complete by: As directed        DISPOSITION: Skilled nursing facility   DISCHARGE FOLLOW-UP  Follow-up Information    Vida Rigger, MD. Schedule an appointment as soon as possible for a visit on 02/01/2021.   Specialty: Pulmonary Disease Why: @ 9am Contact information: [address] [place] [address] [phone]        Leanna Sato, MD Follow up in 2 week(s).   Specialty: Family Medicine Contact information: [address] [place] [address] [phone]        Linus Galas, DPM Follow up on 01/25/2021.   Specialty: Podiatry Why: at 3:30 PM Contact information: [address] [place] [address] [phone]                Time coordinating discharge:  35 mins   Signed:   Thad Ranger M.D. [facility] 01/22/2021,  12:47 PM

## 2021-01-22 NOTE — Care Management Important Message (Signed)
Important Message  Patient Details  Name: Elizabeth Rowland MRN: [id number] Date of Birth: Jan 16, 1941   Medicare Important Message Given:  Yes     [name] 01/22/2021, 12:02 PM

## 2021-01-22 NOTE — TOC Progression Note (Signed)
Transition of Care [facility]) - Progression Note    Patient Details  Name: Elizabeth Rowland MRN: [id number] Date of Birth: 1941-05-03  Transition of Care [facility]) CM/SW Contact  Hetty Ely, RN Phone Number: 01/22/2021, 4:30 PM  Clinical Narrative:  [facility], SNF accepted patient and will be able to admit tomorrow, 01/23/21. Attending and RN notified.    Expected Discharge Plan: Home w Home Health Services Barriers to Discharge: Barriers Resolved  Expected Discharge Plan and Services Expected Discharge Plan: Home w Home Health Services     Post Acute Care Choice: Home Health Living arrangements for the past 2 months: Single Family Home Expected Discharge Date: 01/22/21               DME Arranged: Oxygen DME Agency: [employer] Date DME Agency Contacted: 01/18/21   Representative spoke with at DME Agency: Oletha Cruel, Terrence Dupont HH Arranged: PT,OT [facility] Agency: [facility] Date HH Agency Contacted: 01/18/21 Time HH Agency Contacted: [phone] Representative spoke with at [facility] Agency: Feliberto Gottron   Social Determinants of Health (SDOH) Interventions    Readmission Risk Interventions No flowsheet data found.

## 2021-01-23 NOTE — TOC Transition Note (Signed)
Transition of Care [facility]) - CM/SW Discharge Note   Patient Details  Name: JAELYNN POZO MRN: [id number] Date of Birth: 1941/03/15  [facility]) CM/SW Contact:  Hetty Ely, RN Phone Number: 01/23/2021, 9:33 AM     Final next level of care: Skilled Nursing Facility Barriers to Discharge: Barriers Resolved   Patient Goals and CMS Choice Patient states their goals for this hospitalization and ongoing recovery are:: To SNF, [facility] CMS Medicare.gov Compare Post Acute Care list provided to:: Patient Represenative (must comment) (Son, [facility]) Choice offered to / list presented to : Adult Children  Discharge Placement                Patient to be transferred to facility by: [facility] Name of family member notified: [facility] Patient and family notified of of transfer: 01/23/21  Discharge Plan and Services     Post Acute Care Choice: Home Health          DME Arranged: N/A DME Agency: NA Date DME Agency Contacted: 01/18/21   Representative spoke with at DME Agency: Oletha Cruel, Terrence Dupont HH Arranged: NA HH Agency: NA Date HH Agency Contacted: 01/18/21 Time HH Agency Contacted: [phone] [facility] with at [facility] Agency: Feliberto Gottron  Social Determinants of Health (SDOH) Interventions     Readmission Risk Interventions No flowsheet data found.

## 2021-01-23 NOTE — Progress Notes (Signed)
[facility]                                                                              Patient Demographics  Elizabeth Rowland, is a 80 y.o. female, DOB - 27-Feb-1941, STM:[id number]  Admit date - 01/06/2021   Admitting Physician Marrion Coy, MD  Outpatient Primary MD for the patient is Leanna Sato, MD  Outpatient specialists:   LOS - 16  days   Medical records reviewed and are as summarized below:    Chief Complaint  Patient presents with  . Shortness of Breath    Loss of taiste x 2 weeks, sob, pt from home        Brief summary    Patient is a 80 year old female with history of hypertension, hyperlipidemia, fibromyalgia presented to the hospital for shortness of breath, was hypoxic in ED and requiring 10 L O2.  Patient was found to have severe sepsis secondary to pneumonia, acute pulmonary embolism and acute hypoxic respiratory failure.  She also had ARDS, test for COVID-19 were negative.  Patient was treated with empiric IV antibiotics, IV heparin, IV steroids and IV Lasix.  Subsequently transitioned to oral Eliquis for long-term anticoagulation.  Currently off of HFNC however still on 6 L O2 via [place], weaning down. Pulmonology consulted.  She has a thyroid nodule and outpatient follow-up is recommended for further evaluation.  She also had a right mid to lower lobe mass-like opacity on chest x-ray but this was not evident on CT chest.  Outpatient follow-up is recommended for this as well.  She was evaluated by PT and OT recommended discharge to SNF  Stable to be discharged to skilled nursing facility.  Discharge summary done on 01/22/2021, no changes   Assessment & Plan     Principal problem  Severe sepsis with acute organ dysfunction (HCC), acute respiratory failure with hypoxia, ARDS -Much improved, weaned off of HFNC, O2 sats 96% on 2 L -Completed 8 days of IV antibiotics on 01/13/2021. -Completed 3-day course of high-dose steroids on 4/27, was  resumed on low-dose IV steroids on 5/1.  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  -BNP improved 45, Cr stable  -Negative balance of 6.5 L     Hyperlipidemia -Continue pravastatin  Leukocytosis -Likely reactive or related to steroids, no fevers  Bilateral pulmonary embolism, right peroneal DVT -H&H stable, continue eliquis  Oral thrush -Continue nystatin  Demand ischemia with elevated troponin likely due to severe sepsis, hypoxia, mild volume overload -Currently stable, no chest pain or shortness of breath  Right mid to lower lung mass like opacity on chest x-ray -Incidently seen on chest x-ray but not seen on CT chest.  Outpatient follow-up recommended  Thyroid nodule -Outpatient follow-up recommended  Chronic pain/fibromyalgia Continue tramadol 100 mg 3 times daily   Mild protein calorie malnutrition due to inadequate oral intake, acute on -Dietitian  following, continue nutritional supplements  Obesity Estimated body mass index is 34.09 kg/m as calculated from the following:   Height as of this encounter: 5\' 6"  (1.676 m).   Weight as of this encounter: 95.8 kg.   Goals of care, generalized debility -Patient and son agreeable for SNF, awaiting for facility  Code Status: Full DVT Prophylaxis:  Place TED hose Start: 01/06/21 1523 apixaban (ELIQUIS) tablet 5 mg   Level of Care: Level of care: Progressive Cardiac Family Communication: Discussed all imaging results, lab results, explained to the patient and son at the bedside.  Pending discharge today to skilled nursing [facility].   Disposition Plan:     DC summary done on 01/22/2021, stillstands, no changes.  Patient is medically stable for discharge.  Rapid  COVID-19 test negative.  Time Spent in minutes 25 minutes  Procedures:  None  Consultants:   Pulmonology Palliative medicine  Antimicrobials:   Anti-infectives (From admission, onward)   Start     Dose/Rate Route Frequency Ordered Stop   01/09/21 1230  ceFEPIme (MAXIPIME) 2 g in sodium chloride 0.9 % 100 mL IVPB  Status:  Discontinued        2 g 200 mL/hr over 30 Minutes Intravenous Every 8 hours 01/09/21 1201 01/13/21 1429   01/07/21 1500  cefTRIAXone (ROCEPHIN) 2 g in sodium chloride 0.9 % 100 mL IVPB  Status:  Discontinued        2 g 200 mL/hr over 30 Minutes Intravenous Every 24 hours 01/06/21 1524 01/07/21 1207   01/07/21 1500  azithromycin (ZITHROMAX) 500 mg in sodium chloride 0.9 % 250 mL IVPB        500 mg 250 mL/hr over 60 Minutes Intravenous Every 24 hours 01/06/21 1524 01/12/21 1524   01/06/21 1515  cefTRIAXone (ROCEPHIN) 2 g in sodium chloride 0.9 % 100 mL IVPB        2 g 200 mL/hr over 30 Minutes Intravenous  Once 01/06/21 1506 01/06/21 1546   01/06/21 1515  azithromycin (ZITHROMAX) 500 mg in sodium chloride 0.9 % 250 mL IVPB        500 mg 250 mL/hr over 60 Minutes Intravenous  Once 01/06/21 1506 01/06/21 1645         Medications  Scheduled Meds: . apixaban  5 mg Oral BID  . feeding supplement  237 mL Oral QHS  . furosemide  40 mg Oral Daily  . gabapentin  100 mg Oral TID  . multivitamin with minerals  1 tablet Oral Daily  . pravastatin  10 mg Oral Daily  . predniSONE  20 mg Oral Q breakfast  . senna  1 tablet Oral QHS  . traMADol  100 mg Oral TID   Continuous Infusions: . sodium chloride 250 mL (01/13/21 1302)   PRN Meds:.sodium chloride, acetaminophen **OR** acetaminophen, hydrALAZINE, ondansetron **OR** ondansetron (ZOFRAN) IV      Subjective:   01/15/21 was seen and examined today.  No acute complaints.  Awaiting skilled nursing facility transfer.  No acute issues overnight.    Objective:   Vitals:   01/22/21 2027 01/22/21  2040 01/23/21 0401 01/23/21 0803  BP: 125/76  125/76 133/66  Pulse: (!) 102  99 89  Resp: 19  18 18   Temp: 97.8 F (36.6 C)  97.9 F (36.6 C) 97.8 F (36.6 C)  TempSrc:    Oral  SpO2: 96% 96% 98% 96%  Weight:   95.8 kg   Height:        Intake/Output Summary (Last  24 hours) at 01/23/2021 1036 Last data filed at 01/23/2021 0940 Gross per 24 hour  Intake 480 ml  Output 500 ml  Net -20 ml     Wt Readings from Last 3 Encounters:  01/23/21 95.8 kg   Physical Exam  General: Alert and oriented x 3, NAD  Cardiovascular: S1 S2 clear, RRR. No pedal edema b/l  Respiratory: CTAB, no wheezing, rales or rhonchi  Gastrointestinal: Soft, nontender, nondistended, NBS  Ext: no pedal edema bilaterally, chronic lesions  Psych: Normal affect and demeanor, alert and oriented x3    Data Reviewed:  I have personally reviewed following labs and imaging studies  Micro Results Recent Results (from the past 240 hour(s))  Resp Panel by RT-PCR (Flu A&B, Covid) Nasopharyngeal Swab     Status: None   Collection Time: 01/22/21 11:43 AM   Specimen: Nasopharyngeal Swab; Nasopharyngeal(NP) swabs in vial transport medium  Result Value Ref Range Status   SARS Coronavirus 2 by RT PCR NEGATIVE NEGATIVE Final    Comment: (NOTE) SARS-CoV-2 target nucleic acids are NOT DETECTED.  The SARS-CoV-2 RNA is generally detectable in upper respiratory specimens during the acute phase of infection. The lowest concentration of SARS-CoV-2 viral copies this assay can detect is 138 copies/mL. A negative result does not preclude SARS-Cov-2 infection and should not be used as the sole basis for treatment or other patient management decisions. A negative result may occur with  improper specimen collection/handling, submission of specimen other than nasopharyngeal swab, presence of viral mutation(s) within the areas targeted by this assay, and inadequate number of viral copies(<138 copies/mL). A negative result must  be combined with clinical observations, patient history, and epidemiological information. The expected result is Negative.  Fact Sheet for Patients:  BloggerCourse.com  Fact Sheet for Healthcare Providers:  SeriousBroker.it  This test is no t yet approved or cleared by the [place] FDA and  has been authorized for detection and/or diagnosis of SARS-CoV-2 by FDA under an Emergency Use Authorization (EUA). This EUA will remain  in effect (meaning this test can be used) for the duration of the COVID-19 declaration under Section 564(b)(1) of the Act, 21 U.S.C.section 360bbb-3(b)(1), unless the authorization is terminated  or revoked sooner.       Influenza A by PCR NEGATIVE NEGATIVE Final   Influenza B by PCR NEGATIVE NEGATIVE Final    Comment: (NOTE) The Xpert Xpress SARS-CoV-2/FLU/RSV plus assay is intended as an aid in the diagnosis of influenza from Nasopharyngeal swab specimens and should not be used as a sole basis for treatment. Nasal washings and aspirates are unacceptable for Xpert Xpress SARS-CoV-2/FLU/RSV testing.  Fact Sheet for Patients: BloggerCourse.com  Fact Sheet for Healthcare Providers: SeriousBroker.it  This test is not yet approved or cleared by the [place] FDA and has been authorized for detection and/or diagnosis of SARS-CoV-2 by FDA under an Emergency Use Authorization (EUA). This EUA will remain in effect (meaning this test can be used) for the duration of the COVID-19 declaration under Section 564(b)(1) of the Act, 21 U.S.C. section 360bbb-3(b)(1), unless the authorization is terminated or revoked.  Performed at [facility], [address], [place], [place] [address]     Radiology Reports CT Angio Chest PE W and/or Wo Contrast  Addendum Date: 01/06/2021   ADDENDUM REPORT: 01/06/2021 16:38 ADDENDUM: Of note, in the CTA CHEST  FINDINGS section, under the Cardiovascular subsection, the original report incorrectly states that there is no pericardial effusion. This section should read that there is a  small pericardial effusion. The right report is otherwise unchanged. Electronically Signed   By: Romona Curls M.D.   On: 01/06/2021 16:38   Result Date: 01/06/2021 CLINICAL DATA:  Increased shortness of breath for 2 weeks, concern for pulmonary embolism and metastatic disease. EXAM: CT ANGIOGRAPHY CHEST CT ABDOMEN AND PELVIS WITH CONTRAST TECHNIQUE: Multidetector CT imaging of the chest was performed using the standard protocol during bolus administration of intravenous contrast. Multiplanar CT image reconstructions and MIPs were obtained to evaluate the vascular anatomy. Multidetector CT imaging of the abdomen and pelvis was performed using the standard protocol during bolus administration of intravenous contrast. CONTRAST:  11mL OMNIPAQUE IOHEXOL 350 MG/ML SOLN COMPARISON:  Same day chest radiograph and CT abdomen dated 11/20/2007. FINDINGS: CTA CHEST FINDINGS Cardiovascular: Satisfactory opacification of the pulmonary arteries to the segmental level. Multiple filling defects in segmental and subsegmental pulmonary arteries supplying the right upper lobe and left upper lobe are consistent with pulmonary emboli. The main pulmonary artery is enlarged, measuring 3.3 cm in diameter, suggestive of pulmonary hypertension. Vascular calcifications are seen in the coronary arteries and aortic arch. Normal heart size. No pericardial effusion. Mediastinum/Nodes: Bilateral hilar and mediastinal adenopathy is noted. For reference, a right paratracheal lymph node measures 1.1 cm short axis (series 5, image 26). No enlarged axillary lymph nodes. A left thyroid nodule measures 2.9 x 1.5 cm. The trachea and esophagus demonstrate no significant findings. Lungs/Pleura: Severe diffuse bilateral ground-glass opacities and septal line thickening is seen. There  is a small left and trace right pleural effusion with associated atelectasis. There is no pneumothorax. Musculoskeletal: No chest wall abnormality. No acute or significant osseous findings. Review of the MIP images confirms the above findings. CT ABDOMEN and PELVIS FINDINGS Hepatobiliary: An 8 mm cyst is seen in the right hepatic lobe. No gallstones, gallbladder wall thickening, or biliary dilatation. Pancreas: Unremarkable. No pancreatic ductal dilatation or surrounding inflammatory changes. Spleen: Normal in size without focal abnormality. Adrenals/Urinary Tract: Adrenal glands are unremarkable. Other than a 2 cm cyst in the left kidney, the kidneys are normal, without renal calculi, focal lesion, or hydronephrosis. Bladder is unremarkable. Stomach/Bowel: There is a small hiatal hernia. Appendix appears normal. No evidence of bowel wall thickening, distention, or inflammatory changes. Vascular/Lymphatic: Aortic atherosclerosis. No enlarged abdominal or pelvic lymph nodes. Reproductive: Uterus and bilateral adnexa are unremarkable. Other: There is a fat containing ventral abdominal wall hernia near the umbilicus. No free intraperitoneal fluid. Musculoskeletal: Degenerative changes are seen in the spine. Review of the MIP images confirms the above findings. IMPRESSION: 1. Multiple filling defects in segmental and subsegmental pulmonary arteries supplying the right upper lobe and left upper lobe, consistent with pulmonary emboli. 2. Severe diffuse bilateral ground-glass opacities and septal line thickening likely represent pulmonary edema/pneumonia. Small left and trace right pleural effusions with associated atelectasis. The previously questioned masslike opacity overlying the right mid and lower lung likely reflected the right atrial silhouette. 3. Bilateral hilar and mediastinal adenopathy is likely reactive. 4. Left thyroid nodule measures 2.9 x 1.5 cm. Recommend thyroid US (ref: J Am Coll Radiol. 2015 Feb;12(2):  143-50). 5. No acute process in the abdomen or pelvis. Aortic Atherosclerosis (ICD10-I70.0). These results were called by telephone at the time of interpretation on 01/06/2021 at 4:10 pm to provider Dr. Dionne Bucy, who verbally acknowledged these results. Electronically Signed: By: Romona Curls M.D. On: 01/06/2021 16:11   CT ABDOMEN PELVIS W CONTRAST  Addendum Date: 01/06/2021   ADDENDUM REPORT: 01/06/2021 16:38 ADDENDUM: Of note, in  the CTA CHEST FINDINGS section, under the Cardiovascular subsection, the original report incorrectly states that there is no pericardial effusion. This section should read that there is a small pericardial effusion. The right report is otherwise unchanged. Electronically Signed   By: Romona Curls M.D.   On: 01/06/2021 16:38   Result Date: 01/06/2021 CLINICAL DATA:  Increased shortness of breath for 2 weeks, concern for pulmonary embolism and metastatic disease. EXAM: CT ANGIOGRAPHY CHEST CT ABDOMEN AND PELVIS WITH CONTRAST TECHNIQUE: Multidetector CT imaging of the chest was performed using the standard protocol during bolus administration of intravenous contrast. Multiplanar CT image reconstructions and MIPs were obtained to evaluate the vascular anatomy. Multidetector CT imaging of the abdomen and pelvis was performed using the standard protocol during bolus administration of intravenous contrast. CONTRAST:  75mL OMNIPAQUE IOHEXOL 350 MG/ML SOLN COMPARISON:  Same day chest radiograph and CT abdomen dated 11/20/2007. FINDINGS: CTA CHEST FINDINGS Cardiovascular: Satisfactory opacification of the pulmonary arteries to the segmental level. Multiple filling defects in segmental and subsegmental pulmonary arteries supplying the right upper lobe and left upper lobe are consistent with pulmonary emboli. The main pulmonary artery is enlarged, measuring 3.3 cm in diameter, suggestive of pulmonary hypertension. Vascular calcifications are seen in the coronary arteries and aortic  arch. Normal heart size. No pericardial effusion. Mediastinum/Nodes: Bilateral hilar and mediastinal adenopathy is noted. For reference, a right paratracheal lymph node measures 1.1 cm short axis (series 5, image 26). No enlarged axillary lymph nodes. A left thyroid nodule measures 2.9 x 1.5 cm. The trachea and esophagus demonstrate no significant findings. Lungs/Pleura: Severe diffuse bilateral ground-glass opacities and septal line thickening is seen. There is a small left and trace right pleural effusion with associated atelectasis. There is no pneumothorax. Musculoskeletal: No chest wall abnormality. No acute or significant osseous findings. Review of the MIP images confirms the above findings. CT ABDOMEN and PELVIS FINDINGS Hepatobiliary: An 8 mm cyst is seen in the right hepatic lobe. No gallstones, gallbladder wall thickening, or biliary dilatation. Pancreas: Unremarkable. No pancreatic ductal dilatation or surrounding inflammatory changes. Spleen: Normal in size without focal abnormality. Adrenals/Urinary Tract: Adrenal glands are unremarkable. Other than a 2 cm cyst in the left kidney, the kidneys are normal, without renal calculi, focal lesion, or hydronephrosis. Bladder is unremarkable. Stomach/Bowel: There is a small hiatal hernia. Appendix appears normal. No evidence of bowel wall thickening, distention, or inflammatory changes. Vascular/Lymphatic: Aortic atherosclerosis. No enlarged abdominal or pelvic lymph nodes. Reproductive: Uterus and bilateral adnexa are unremarkable. Other: There is a fat containing ventral abdominal wall hernia near the umbilicus. No free intraperitoneal fluid. Musculoskeletal: Degenerative changes are seen in the spine. Review of the MIP images confirms the above findings. IMPRESSION: 1. Multiple filling defects in segmental and subsegmental pulmonary arteries supplying the right upper lobe and left upper lobe, consistent with pulmonary emboli. 2. Severe diffuse bilateral  ground-glass opacities and septal line thickening likely represent pulmonary edema/pneumonia. Small left and trace right pleural effusions with associated atelectasis. The previously questioned masslike opacity overlying the right mid and lower lung likely reflected the right atrial silhouette. 3. Bilateral hilar and mediastinal adenopathy is likely reactive. 4. Left thyroid nodule measures 2.9 x 1.5 cm. Recommend thyroid US (ref: J Am Coll Radiol. 2015 Feb;12(2): 143-50). 5. No acute process in the abdomen or pelvis. Aortic Atherosclerosis (ICD10-I70.0). These results were called by telephone at the time of interpretation on 01/06/2021 at 4:10 pm to provider Dr. Dionne Bucy, who verbally acknowledged these results. Electronically Signed: By:  Romona Curls M.D. On: 01/06/2021 16:11   US Venous Img Lower Bilateral (DVT)  Result Date: 01/07/2021 CLINICAL DATA:  Pulmonary embolism. EXAM: BILATERAL LOWER EXTREMITY VENOUS DOPPLER ULTRASOUND TECHNIQUE: Gray-scale sonography with compression, as well as color and duplex ultrasound, were performed to evaluate the deep venous system(s) from the level of the common femoral vein through the popliteal and proximal calf veins. COMPARISON:  None. FINDINGS: VENOUS On the right, hypoechoic thrombus in a noncompressible peroneal vein. Normal compressibility of the common femoral, superficial femoral, and popliteal veins. Visualized portions of profunda femoral vein and great saphenous vein unremarkable. No filling defects to suggest DVT on grayscale or color Doppler imaging. On the left, Normal compressibility of the common femoral, superficial femoral, and popliteal veins, as well as the visualized calf veins. Visualized portions of profunda femoral vein and great saphenous vein unremarkable. No filling defects to suggest DVT on grayscale or color Doppler imaging. Doppler waveforms show normal direction of venous flow, normal respiratory phasicity and response to  augmentation. OTHER None. Limitations: none IMPRESSION: 1. POSITIVE for isolated right peroneal (calf) DVT. 2. Negative for left lower extremity DVT. Electronically Signed   By: Corlis Leak M.D.   On: 01/07/2021 08:03   DG CHEST PORT 1 VIEW  Result Date: 01/08/2021 CLINICAL DATA:  Increased shortness of breath. EXAM: PORTABLE CHEST 1 VIEW COMPARISON:  01/06/2021 chest radiograph and CT chest. FINDINGS: Patient is rotated. Heart is enlarged, stable. Similar to minimally increased diffuse mixed interstitial and airspace opacification. There may be a small right pleural effusion. IMPRESSION: 1. Similar to minimally increased diffuse mixed interstitial and airspace opacification. Findings may be due to edema or viral/atypical pneumonia, including due to COVID-19. 2. Probable small right pleural effusion. Electronically Signed   By: Leanna Battles M.D.   On: 01/08/2021 08:54   DG Chest Portable 1 View  Result Date: 01/06/2021 CLINICAL DATA:  Shortness of breath EXAM: PORTABLE CHEST 1 VIEW COMPARISON:  07/06/2014 FINDINGS: Heart size appears within normal limits. Rounded mass-like opacity within the right mid to lower lung field measuring approximately 6.4 cm in diameter. Diffuse interstitial opacities throughout both lungs. Small right pleural effusion. No pneumothorax. IMPRESSION: 1. Rounded mass-like opacity within the right mid to lower lung field measuring approximately 6.4 cm in diameter. Further evaluation with contrast-enhanced CT of the chest is recommended. 2. Extensive bilateral interstitial opacities which may reflect pulmonary edema or possibly atypical/viral infection. 3. Small right pleural effusion. Electronically Signed   By: Duanne Guess D.O.   On: 01/06/2021 14:12   ECHOCARDIOGRAM COMPLETE  Result Date: 01/08/2021    ECHOCARDIOGRAM REPORT   Patient Name:   JAYDEEN DARLEY Alonge Date of Exam: 01/08/2021 Medical Rec #:  [id number]           Height:       66.0 in Accession #:    [id number]           Weight:       219.4 lb Date of Birth:  09-30-1940          BSA:          2.080 m Patient Age:    79 years            BP:           158/58 mmHg Patient Gender: F                   HR:           92 bpm.  Exam Location:  [facility] Procedure: 2D Echo, Cardiac Doppler and Color Doppler Indications:     Pulmonary embolus I26.09  History:         Patient has no prior history of Echocardiogram examinations. No                  medical history on file.  Sonographer:     Cristela Blue RDCS (AE) Referring Phys:  [id number] AMY N [facility] Diagnosing Phys: Lorine Bears MD  Sonographer Comments: Technically challenging study due to limited acoustic windows and suboptimal apical window. IMPRESSIONS  1. Left ventricular ejection fraction, by estimation, is 55 to 60%. The left ventricle has normal function. Left ventricular endocardial border not optimally defined to evaluate regional wall motion. Left ventricular diastolic parameters are consistent with Grade I diastolic dysfunction (impaired relaxation).  2. Right ventricular systolic function is normal. The right ventricular size is normal. Tricuspid regurgitation signal is inadequate for assessing PA pressure.  3. Left atrial size was mildly dilated.  4. Right atrial size was mildly dilated.  5. The mitral valve is normal in structure. No evidence of mitral valve regurgitation. No evidence of mitral stenosis.  6. The aortic valve is normal in structure. Aortic valve regurgitation is not visualized. No aortic stenosis is present. FINDINGS  Left Ventricle: Left ventricular ejection fraction, by estimation, is 55 to 60%. The left ventricle has normal function. Left ventricular endocardial border not optimally defined to evaluate regional wall motion. The left ventricular internal cavity size was normal in size. There is no left ventricular hypertrophy. Left ventricular diastolic parameters are consistent with Grade I diastolic dysfunction (impaired relaxation). Right Ventricle: The right  ventricular size is normal. No increase in right ventricular wall thickness. Right ventricular systolic function is normal. Tricuspid regurgitation signal is inadequate for assessing PA pressure. Left Atrium: Left atrial size was mildly dilated. Right Atrium: Right atrial size was mildly dilated. Pericardium: There is no evidence of pericardial effusion. Mitral Valve: The mitral valve is normal in structure. No evidence of mitral valve regurgitation. No evidence of mitral valve stenosis. Tricuspid Valve: The tricuspid valve is normal in structure. Tricuspid valve regurgitation is not demonstrated. No evidence of tricuspid stenosis. Aortic Valve: The aortic valve is normal in structure. Aortic valve regurgitation is not visualized. No aortic stenosis is present. Aortic valve mean gradient measures 3.0 mmHg. Aortic valve peak gradient measures 5.9 mmHg. Aortic valve area, by VTI measures 3.45 cm. Pulmonic Valve: The pulmonic valve was normal in structure. Pulmonic valve regurgitation is not visualized. No evidence of pulmonic stenosis. Aorta: The aortic root is normal in size and structure. Venous: The inferior vena cava was not well visualized. IAS/Shunts: No atrial level shunt detected by color flow Doppler.  LEFT VENTRICLE PLAX 2D LVIDd:         3.36 cm  Diastology LVIDs:         2.49 cm  LV e' medial:    4.46 cm/s LV PW:         1.08 cm  LV E/e' medial:  20.6 LV IVS:        0.95 cm  LV e' lateral:   6.74 cm/s LVOT diam:     2.00 cm  LV E/e' lateral: 13.6 LV SV:         78 LV SV Index:   37 LVOT Area:     3.14 cm  RIGHT VENTRICLE RV Basal diam:  3.98 cm RV S prime:     12.40 cm/s LEFT  ATRIUM             Index       RIGHT ATRIUM           Index LA diam:        4.00 cm 1.92 cm/m  RA Area:     23.30 cm LA Vol (A2C):   54.5 ml 26.20 ml/m RA Volume:   78.60 ml  37.79 ml/m LA Vol (A4C):   56.6 ml 27.21 ml/m LA Biplane Vol: 55.3 ml 26.59 ml/m  AORTIC VALVE                   PULMONIC VALVE AV Area (Vmax):    2.78  cm    PV Vmax:        0.60 m/s AV Area (Vmean):   2.71 cm    PV Peak grad:   1.4 mmHg AV Area (VTI):     3.45 cm    RVOT Peak grad: 4 mmHg AV Vmax:           121.00 cm/s AV Vmean:          86.600 cm/s AV VTI:            0.226 m AV Peak Grad:      5.9 mmHg AV Mean Grad:      3.0 mmHg LVOT Vmax:         107.00 cm/s LVOT Vmean:        74.600 cm/s LVOT VTI:          0.248 m LVOT/AV VTI ratio: 1.10  AORTA Ao Root diam: 2.30 cm MITRAL VALVE                TRICUSPID VALVE MV Area (PHT): 2.74 cm     TR Peak grad:   13.7 mmHg MV Decel Time: 277 msec     TR Vmax:        185.00 cm/s MV E velocity: 91.70 cm/s MV A velocity: 126.00 cm/s  SHUNTS MV E/A ratio:  0.73         Systemic VTI:  0.25 m                             Systemic Diam: 2.00 cm Lorine Bears MD Electronically signed by Lorine Bears MD Signature Date/Time: 01/08/2021/10:40:18 AM    Final     Lab Data:  CBC: Recent Labs  Lab 01/17/21 0423 01/18/21 0411 01/19/21 0654 01/20/21 0612 01/22/21 0624  WBC 32.2* 31.6* 32.0* 28.5* 29.8*  NEUTROABS 20.0*  --   --   --   --   HGB 15.5* 15.5* 15.7* 15.5* 15.4*  HCT 49.1* 48.6* 49.3* 48.7* 47.7*  MCV 93.5 93.6 93.0 92.8 92.6  PLT 159 167 172 164 147*   Basic Metabolic Panel: Recent Labs  Lab 01/17/21 0423 01/18/21 0411 01/19/21 0654 01/20/21 0612 01/22/21 0624  NA 135 140 137 137 136  K 3.9 3.5 3.7 3.4* 3.2*  CL 92* 94* 93* 94* 94*  CO2 33* 33* 33* 33* 32  GLUCOSE 169* 136* 131* 115* 118*  BUN 32* 35* 34* 31* 27*  CREATININE 0.71 0.62 0.81 0.72 0.71  CALCIUM 9.1 9.4 8.9 8.7* 8.5*  MG 2.1  --   --   --   --    GFR: Estimated Creatinine Clearance: 64.3 mL/min (by C-G formula based on SCr of 0.71 mg/dL). Liver Function Tests: No results for input(s): AST, ALT,  ALKPHOS, BILITOT, PROT, ALBUMIN in the last 168 hours. No results for input(s): LIPASE, AMYLASE in the last 168 hours. No results for input(s): AMMONIA in the last 168 hours. Coagulation Profile: No results for input(s):  INR, PROTIME in the last 168 hours. Cardiac Enzymes: No results for input(s): CKTOTAL, CKMB, CKMBINDEX, TROPONINI in the last 168 hours. BNP (last 3 results) No results for input(s): PROBNP in the last 8760 hours. HbA1C: No results for input(s): HGBA1C in the last 72 hours. CBG: No results for input(s): GLUCAP in the last 168 hours. Lipid Profile: No results for input(s): CHOL, HDL, LDLCALC, TRIG, CHOLHDL, LDLDIRECT in the last 72 hours. Thyroid Function Tests: No results for input(s): TSH, T4TOTAL, FREET4, T3FREE, THYROIDAB in the last 72 hours. Anemia Panel: No results for input(s): VITAMINB12, FOLATE, FERRITIN, TIBC, IRON, RETICCTPCT in the last 72 hours. Urine analysis:    Component Value Date/Time   COLORURINE AMBER (A) 01/06/2021 0723   APPEARANCEUR CLEAR (A) 01/06/2021 0723   LABSPEC >1.046 (H) 01/06/2021 0723   PHURINE 6.0 01/06/2021 0723   GLUCOSEU NEGATIVE 01/06/2021 0723   HGBUR NEGATIVE 01/06/2021 0723   BILIRUBINUR (A) 01/06/2021 0723    TEST NOT REPORTED DUE TO COLOR INTERFERENCE OF URINE PIGMENT   KETONESUR NEGATIVE 01/06/2021 0723   PROTEINUR NEGATIVE 01/06/2021 0723   NITRITE (A) 01/06/2021 0723    TEST NOT REPORTED DUE TO COLOR INTERFERENCE OF URINE PIGMENT   LEUKOCYTESUR NEGATIVE 01/06/2021 0723     Elizabeth Rowland M.D. [facility] 01/23/2021, 10:36 AM  Available via Epic secure chat 7am-7pm After 7 pm, please refer to night coverage provider listed on amion.

## 2021-01-23 NOTE — Progress Notes (Signed)
Report called and given to [facility], LPN at [facility]. No questions from the facility at this time for nursing staff.

## 2021-01-26 ENCOUNTER — Other Ambulatory Visit: Payer: Self-pay

## 2021-01-26 ENCOUNTER — Inpatient Hospital Stay
Admission: EM | Admit: 2021-01-26 | Discharge: 2021-02-01 | DRG: 871 | Disposition: A | Discharge status: Skilled Nursing Facility | Payer: Medicare Other | Source: Skilled Nursing Facility | Attending: Internal Medicine | Admitting: Internal Medicine

## 2021-01-26 DIAGNOSIS — E876 Hypokalemia: Secondary | ICD-10-CM

## 2021-01-26 DIAGNOSIS — M797 Fibromyalgia: Secondary | ICD-10-CM | POA: Diagnosis present

## 2021-01-26 DIAGNOSIS — J9601 Acute respiratory failure with hypoxia: Secondary | ICD-10-CM | POA: Diagnosis present

## 2021-01-26 DIAGNOSIS — J1282 Pneumonia due to coronavirus disease 2019: Secondary | ICD-10-CM | POA: Diagnosis present

## 2021-01-26 DIAGNOSIS — Z139 Encounter for screening, unspecified: Secondary | ICD-10-CM

## 2021-01-26 DIAGNOSIS — I10 Essential (primary) hypertension: Secondary | ICD-10-CM | POA: Diagnosis present

## 2021-01-26 DIAGNOSIS — E1142 Type 2 diabetes mellitus with diabetic polyneuropathy: Secondary | ICD-10-CM | POA: Diagnosis present

## 2021-01-26 DIAGNOSIS — J80 Acute respiratory distress syndrome: Secondary | ICD-10-CM | POA: Diagnosis present

## 2021-01-26 DIAGNOSIS — Z6836 Body mass index (BMI) 36.0-36.9, adult: Secondary | ICD-10-CM

## 2021-01-26 DIAGNOSIS — R0602 Shortness of breath: Secondary | ICD-10-CM | POA: Diagnosis not present

## 2021-01-26 DIAGNOSIS — Z7901 Long term (current) use of anticoagulants: Secondary | ICD-10-CM

## 2021-01-26 DIAGNOSIS — E1165 Type 2 diabetes mellitus with hyperglycemia: Secondary | ICD-10-CM | POA: Diagnosis present

## 2021-01-26 DIAGNOSIS — A4189 Other specified sepsis: Secondary | ICD-10-CM | POA: Diagnosis not present

## 2021-01-26 DIAGNOSIS — R739 Hyperglycemia, unspecified: Secondary | ICD-10-CM

## 2021-01-26 DIAGNOSIS — Z86711 Personal history of pulmonary embolism: Secondary | ICD-10-CM

## 2021-01-26 DIAGNOSIS — Z28310 Unvaccinated for covid-19: Secondary | ICD-10-CM

## 2021-01-26 DIAGNOSIS — I2699 Other pulmonary embolism without acute cor pulmonale: Secondary | ICD-10-CM | POA: Diagnosis present

## 2021-01-26 DIAGNOSIS — E872 Acidosis: Secondary | ICD-10-CM | POA: Diagnosis present

## 2021-01-26 DIAGNOSIS — Z79891 Long term (current) use of opiate analgesic: Secondary | ICD-10-CM

## 2021-01-26 DIAGNOSIS — Z86718 Personal history of other venous thrombosis and embolism: Secondary | ICD-10-CM

## 2021-01-26 DIAGNOSIS — E669 Obesity, unspecified: Secondary | ICD-10-CM | POA: Diagnosis present

## 2021-01-26 DIAGNOSIS — J9621 Acute and chronic respiratory failure with hypoxia: Secondary | ICD-10-CM

## 2021-01-26 DIAGNOSIS — E1169 Type 2 diabetes mellitus with other specified complication: Secondary | ICD-10-CM

## 2021-01-26 DIAGNOSIS — I82401 Acute embolism and thrombosis of unspecified deep veins of right lower extremity: Secondary | ICD-10-CM

## 2021-01-26 DIAGNOSIS — U071 COVID-19: Secondary | ICD-10-CM

## 2021-01-26 DIAGNOSIS — E785 Hyperlipidemia, unspecified: Secondary | ICD-10-CM | POA: Diagnosis present

## 2021-01-26 DIAGNOSIS — Z79899 Other long term (current) drug therapy: Secondary | ICD-10-CM

## 2021-01-26 DIAGNOSIS — G8929 Other chronic pain: Secondary | ICD-10-CM | POA: Diagnosis present

## 2021-01-26 MED ORDER — ADULT MULTIVITAMIN W/MINERALS CH
1.0000 | ORAL_TABLET | Freq: Every day | ORAL | Status: DC
  Administered 2021-01-26 – 2021-02-01 (×7): 1 via ORAL
  Filled 2021-01-26 (×7): qty 1

## 2021-01-26 MED ORDER — APIXABAN 5 MG PO TABS
5.0000 mg | ORAL_TABLET | Freq: Two times a day (BID) | ORAL | Status: DC
  Administered 2021-01-26 – 2021-02-01 (×12): 5 mg via ORAL
  Filled 2021-01-26 (×12): qty 1

## 2021-01-26 MED ORDER — FUROSEMIDE 40 MG PO TABS
40.0000 mg | ORAL_TABLET | Freq: Every day | ORAL | Status: DC
  Administered 2021-01-26 – 2021-01-29 (×4): 40 mg via ORAL
  Filled 2021-01-26 (×4): qty 1

## 2021-01-26 MED ORDER — TRAMADOL HCL 50 MG PO TABS
100.0000 mg | ORAL_TABLET | Freq: Three times a day (TID) | ORAL | Status: DC
  Administered 2021-01-26 – 2021-02-01 (×17): 100 mg via ORAL
  Filled 2021-01-26 (×17): qty 2

## 2021-01-26 MED ORDER — PRAVASTATIN SODIUM 20 MG PO TABS
10.0000 mg | ORAL_TABLET | Freq: Every day | ORAL | Status: DC
  Administered 2021-01-27 – 2021-01-28 (×2): 10 mg via ORAL
  Filled 2021-01-26 (×2): qty 1

## 2021-01-26 MED ORDER — POTASSIUM CHLORIDE CRYS ER 20 MEQ PO TBCR
20.0000 meq | EXTENDED_RELEASE_TABLET | Freq: Every day | ORAL | Status: DC
  Administered 2021-01-26 – 2021-02-01 (×7): 20 meq via ORAL
  Filled 2021-01-26 (×7): qty 1

## 2021-01-26 MED ORDER — GABAPENTIN 300 MG PO CAPS
300.0000 mg | ORAL_CAPSULE | Freq: Three times a day (TID) | ORAL | Status: DC
  Administered 2021-01-26 – 2021-02-01 (×17): 300 mg via ORAL
  Filled 2021-01-26 (×17): qty 1

## 2021-01-26 NOTE — ED Notes (Signed)
Per pt and son they state pt "does not get taken care of" at [facility]. Pt states she does not get her medications as she is supposed to, son states "pt is not going back there".  Pt also states she does not know why she is here today. Pt states her legs do not hurt, EMS states pt was sent to r/o out DVT's in legs.

## 2021-01-26 NOTE — ED Provider Notes (Signed)
[facility] Emergency Department Provider Note ____________________________________________   Event Date/Time   First MD Initiated Contact with Patient 01/26/21 1707     (approximate)  I have reviewed the triage vital signs and the nursing notes.  HISTORY  Chief Complaint Leg Pain   HPI Elizabeth Rowland is a 80 y.o. femalewho presents to the ED for evaluation of leg pain and swelling.   Chart review indicates recent medical admission 4/23-5/9, discharged to SNF.  She presented with acute hypoxic respiratory failure attributed to pneumonia, acute pulmonary embolism and developed ARDS.  Noted to be diagnosed with a right peroneal DVT and was started on Eliquis.  Pt returns to the ED via EMS for unknown reasons. She reports she feels fine and doesn't know why she is here. She reports she hasn't gotten any of her medications yet today and is hungry.   Pt's son provides majority of history. He reports significant frustration with [facility] due to reported poor treatment of his mother. He reports they have not been appropriately caring for the patient, have not been provider  History reviewed. No pertinent past medical history.  Patient Active Problem List   Diagnosis Date Noted  . Community acquired pneumonia   . Lung mass   . Acute respiratory failure with hypoxia (HCC) 01/07/2021  . Acute respiratory distress syndrome (ARDS) due to COVID-19 virus (HCC) 01/07/2021  . Pulmonary emboli (HCC) 01/07/2021  . Severe sepsis with acute organ dysfunction (HCC) 01/06/2021  . Hyperlipidemia 01/06/2021  . Fibromyalgia 01/06/2021  . Essential hypertension 01/06/2021    History reviewed. No pertinent surgical history.  Prior to Admission medications   Medication Sig Start Date End Date Taking? Authorizing Provider  apixaban (ELIQUIS) 5 MG TABS tablet Take 1 tablet (5 mg total) by mouth 2 (two) times daily. 01/18/21   Rai, Delene Ruffini, MD  furosemide  (LASIX) 40 MG tablet Take 1 tablet (40 mg total) by mouth daily. 01/19/21 01/19/22  Rai, Delene Ruffini, MD  gabapentin (NEURONTIN) 300 MG capsule Take 1 capsule by mouth 3 (three) times daily. 11/24/20   [provider]  Multiple Vitamin (MULTIVITAMIN WITH MINERALS) TABS tablet Take 1 tablet by mouth daily. 01/19/21   Rai, Ripudeep K, MD  nystatin (MYCOSTATIN) 100000 UNIT/ML suspension Take 5 mLs (500,000 Units total) by mouth 4 (four) times daily. X 10 days 01/18/21   Rai, Ripudeep K, MD  potassium chloride SA (KLOR-CON) 20 MEQ tablet Take 1 tablet (20 mEq total) by mouth daily. 01/22/21   Rai, Ripudeep K, MD  pravastatin (PRAVACHOL) 10 MG tablet Take 10 mg by mouth daily. 03/26/14   [provider]  predniSONE (DELTASONE) 10 MG tablet Take prednisone 20mg  PO daily for 1 more day, then taper to 10mg  daily for 3 days, then OFF 01/22/21   Rai, Ripudeep K, MD  traMADol (ULTRAM) 50 MG tablet Take 2 tablets (100 mg total) by mouth in the morning, at noon, and at bedtime. 01/22/21   03/24/21, MD    Allergies Patient has no allergy information on record.  History reviewed. No pertinent family history.  Social History Social History   Tobacco Use  . Smoking status: Never Smoker  . Smokeless tobacco: Never Used  Substance Use Topics  . Alcohol use: Never  . Drug use: Never    Review of Systems  Constitutional: No fever/chills Eyes: No visual changes. ENT: No sore throat. Cardiovascular: Denies chest pain. Respiratory: Denies shortness of breath. Gastrointestinal: No abdominal  pain.  No nausea, no vomiting.  No diarrhea.  No constipation. Genitourinary: Negative for dysuria. Musculoskeletal: Negative for back pain. Skin: Negative for rash. Neurological: Negative for headaches, focal weakness or numbness.  ____________________________________________   PHYSICAL EXAM:  VITAL SIGNS: Vitals:   01/26/21 1710 01/26/21 1712  BP:  121/86  Pulse:  (!) 108  Resp:  (!) 21  Temp:   98.3 F (36.8 C)  SpO2: 95% 95%      Constitutional: Alert and fully oriented.  Obese.  Conversational in full sentences. Eyes: Conjunctivae are normal. PERRL. EOMI. Head: Atraumatic. Nose: No congestion/rhinnorhea. Mouth/Throat: Mucous membranes are moist.  Oropharynx non-erythematous. Neck: No stridor. No cervical spine tenderness to palpation. Cardiovascular: Normal rate, regular rhythm. Grossly normal heart sounds.  Good peripheral circulation. Respiratory: Normal respiratory effort.  No retractions. Lungs CTAB.  On 2 L nasal cannula at baseline. Gastrointestinal: Soft , nondistended, nontender to palpation. No CVA tenderness. Musculoskeletal:  No joint effusions. No signs of acute trauma. Minimal pitting edema to right ankle.  Bilateral legs are neurologically and vascularly intact.  No tenderness or signs of trauma. Neurologic:  Normal speech and language. No gross focal neurologic deficits are appreciated. No gait instability noted. Skin:  Skin is warm, dry and intact. No rash noted. Psychiatric: Mood and affect are normal. Speech and behavior are normal.  ____________________________________________   PROCEDURES and INTERVENTIONS  Procedure(s) performed (including Critical Care):  .1-3 Lead EKG Interpretation Performed by: Delton Prairie, MD Authorized by: Delton Prairie, MD     Interpretation: abnormal     ECG rate:  102   ECG rate assessment: tachycardic     Rhythm: sinus tachycardia     Ectopy: none     Conduction: normal      Medications  apixaban (ELIQUIS) tablet 5 mg (has no administration in time range)  furosemide (LASIX) tablet 40 mg (40 mg Oral Given 01/26/21 1849)  gabapentin (NEURONTIN) capsule 300 mg (has no administration in time range)  multivitamin with minerals tablet 1 tablet (1 tablet Oral Given 01/26/21 1849)  potassium chloride SA (KLOR-CON) CR tablet 20 mEq (20 mEq Oral Given 01/26/21 1849)  traMADol (ULTRAM) tablet 100 mg (has no administration  in time range)  pravastatin (PRAVACHOL) tablet 10 mg (has no administration in time range)    ____________________________________________   MDM / ED COURSE   Patient returns to the ED with her son due to concerns for being cared for poorly a local SNF and requesting disposition to a different SNF.  Patient with mild sinus tachycardia, consistent with her elevated heart rates when I look at her recent admission, otherwise normal vitals on her home O2.  She looks well and indicates she has no complaints or concerns.  Reports she feels fine.  I see no indication for diagnostics here in the ED at this time.  Son refusing to take the patient back to [facility] and is requesting placement to a different SNF.  Patient agrees with this.  We will order her home medications, provided diet and discussed the case with social work for SNF placement.  Clinical Course as of 01/26/21 1907  Caleen Essex Jan 26, 2021  1805 Called over to [facility] [DS]  1808 I speak with Chauvan, Associate Professor, therapist working with her today said patient's legs were hurting her.  No missed doses of medications.  She reports son is upset because he was going to have pay additional money for a transport to podiatrist for a nail trimming, more  than he was initially told.  [DS]  1858 Reassessed.  Patient sitting up in bed and eating a [place] sandwich.  Reports feeling fine.  Son and patient again refusing to go back to [facility] indicating they will feel comfortable there.  We discussed holding in the ED and waiting for social work to assist with placement.  We discussed this likely taking a few days, considering the weekend, and they are agreeable.  We further discussed going out into the hallway bed to open up this room for more sick patients, they are agreeable with this. [DS]    Clinical Course User Index [DS] Delton Prairie, MD    ____________________________________________   FINAL CLINICAL IMPRESSION(S)  / ED DIAGNOSES  Final diagnoses:  Encounter for medical screening examination     ED Discharge Orders    None       Aalaya Yadao Katrinka Blazing   Note:  This document was prepared using Dragon voice recognition software and may include unintentional dictation errors.   Delton Prairie, MD 01/26/21 1910

## 2021-01-26 NOTE — ED Notes (Signed)
Patient moved to Hallway 1 due to fact that she will be here for the weekend, as we dont have SW through the weekend.  Patient is aox4, able to voice needs.  She is incontinent, we are working on getting a Purewick system for her.    The hair on the back of her head is matted and caked with dirt, RN asked patient how long this had been there and she responded "My granddaughter is going to cut it all off of me."  Made note to son as well so that he knew that this was there prior to arrival at this facility.    Patient has stage 1 pressure ulcers to the buttocks.  Pillow used to offset weight.    Patient has no complaints at this time.

## 2021-01-26 NOTE — ED Notes (Signed)
Pt had covid and was in the hospital for 2 weeks

## 2021-01-26 NOTE — ED Triage Notes (Signed)
Pt came from [facility]; with c/o dvt and leg swelling and pain and warmth; was discharged for a dvt workup two days ago; 2L o2 ;

## 2021-01-27 NOTE — ED Notes (Signed)
Pt advised she needed to use the bed pan. As we rolled pt she had already urinated in the bed, Cleaned and changed pt and placed a brief.

## 2021-01-27 NOTE — ED Notes (Signed)
Patient is resting comfortably.  Patient is able to move in the bed without being prompted to do so.

## 2021-01-27 NOTE — ED Notes (Addendum)
Son at bedside. Pt given cup of coffee at this time.

## 2021-01-27 NOTE — NC FL2 (Signed)
[place] MEDICAID FL2 LEVEL OF CARE SCREENING TOOL     IDENTIFICATION  Patient Name: Elizabeth Rowland Birthdate: [birth year] Sex: female Admission Date (Current Location): 01/26/2021  [place] and [place] Number:  Chiropodist and Address:  [facility], [address] [place], [place] [address]      Provider Number: [id number]  Attending Physician Name and Address:  No att. providers found  Relative Name and Phone Number:  Erandi, Lemma (Son)   [phone] [facility] Phone)    Current Level of Care: Hospital Recommended Level of Care: Skilled Nursing Facility Prior Approval Number:    Date Approved/Denied:   PASRR Number: [id number]  Discharge Plan: SNF    Current Diagnoses: Patient Active Problem List   Diagnosis Date Noted  . Community acquired pneumonia   . Lung mass   . Acute respiratory failure with hypoxia (HCC) 01/07/2021  . Acute respiratory distress syndrome (ARDS) due to COVID-19 virus (HCC) 01/07/2021  . Pulmonary emboli (HCC) 01/07/2021  . Severe sepsis with acute organ dysfunction (HCC) 01/06/2021  . Hyperlipidemia 01/06/2021  . Fibromyalgia 01/06/2021  . Essential hypertension 01/06/2021    Orientation RESPIRATION BLADDER Height & Weight     Self,Time,Situation,Place  Normal External catheter,Incontinent Weight: 207 lb (93.9 kg) Height:  5\' 3"  (160 cm)  BEHAVIORAL SYMPTOMS/MOOD NEUROLOGICAL BOWEL NUTRITION STATUS      Incontinent Diet (heart diet; thin liquids)  AMBULATORY STATUS COMMUNICATION OF NEEDS Skin   Extensive Assist Verbally Other (Comment) (Dry, flaky, cracking)                       Personal Care Assistance Level of Assistance  Bathing,Feeding,Dressing Bathing Assistance: Maximum assistance Feeding assistance: Limited assistance Dressing Assistance: Maximum assistance     Functional Limitations Info             SPECIAL CARE FACTORS FREQUENCY  PT (By licensed PT),OT (By licensed OT)      PT Frequency: 5x/week OT Frequency: 5x/week            Contractures Contractures Info: Not present    Additional Factors Info  Code Status,Allergies Code Status Info: full Allergies Info: not on file           Current Medications (01/27/2021):  This is the current hospital active medication list Current Facility-Administered Medications  Medication Dose Route Frequency Provider Last Rate Last Admin  . apixaban (ELIQUIS) tablet 5 mg  5 mg Oral BID 01/29/2021, MD   5 mg at 01/27/21 1020  . furosemide (LASIX) tablet 40 mg  40 mg Oral Daily 01/29/21, MD   40 mg at 01/27/21 1020  . gabapentin (NEURONTIN) capsule 300 mg  300 mg Oral TID 01/29/21, MD   300 mg at 01/27/21 1020  . multivitamin with minerals tablet 1 tablet  1 tablet Oral Daily 01/29/21, MD   1 tablet at 01/27/21 1015  . potassium chloride SA (KLOR-CON) CR tablet 20 mEq  20 mEq Oral Daily 01/29/21, MD   20 mEq at 01/27/21 1020  . pravastatin (PRAVACHOL) tablet 10 mg  10 mg Oral Daily 01/29/21, MD   10 mg at 01/27/21 1021  . traMADol (ULTRAM) tablet 100 mg  100 mg Oral TID 01/29/21, MD   100 mg at 01/27/21 1021   Current Outpatient Medications  Medication Sig Dispense Refill  . furosemide (LASIX) 40 MG tablet Take 1 tablet (40 mg total) by mouth daily. 30 tablet 11  .  gabapentin (NEURONTIN) 300 MG capsule Take 1 capsule by mouth 3 (three) times daily.    . Multiple Vitamin (MULTIVITAMIN WITH MINERALS) TABS tablet Take 1 tablet by mouth daily. 30 tablet 3  . nystatin (MYCOSTATIN) 100000 UNIT/ML suspension Take 5 mLs (500,000 Units total) by mouth 4 (four) times daily. X 10 days 60 mL 0  . potassium chloride SA (KLOR-CON) 20 MEQ tablet Take 1 tablet (20 mEq total) by mouth daily.  0  . predniSONE (DELTASONE) 10 MG tablet Take prednisone 20mg  PO daily for 1 more day, then taper to 10mg  daily for 3 days, then OFF    . traMADol (ULTRAM) 50 MG tablet Take 2 tablets (100 mg total) by mouth in the  morning, at noon, and at bedtime. 30 tablet 0  . apixaban (ELIQUIS) 5 MG TABS tablet Take 1 tablet (5 mg total) by mouth 2 (two) times daily. 60 tablet 3  . docusate sodium (COLACE) 100 MG capsule Take 100 mg by mouth 2 (two) times daily.    . polyethylene glycol (MIRALAX / GLYCOLAX) 17 g packet Take 17 g by mouth daily as needed for mild constipation, moderate constipation or severe constipation.    . pravastatin (PRAVACHOL) 10 MG tablet Take 10 mg by mouth daily.       Discharge Medications: Please see discharge summary for a list of discharge medications.  Relevant Imaging Results:  Relevant Lab Results:   Additional Information SS #: [id number]  , LCSW

## 2021-01-27 NOTE — ED Provider Notes (Signed)
Emergency Medicine Observation Re-evaluation Note  Elizabeth Rowland is a 80 y.o. female, seen on rounds today.  Pt initially presented to the ED for complaints of Leg Pain Currently, the patient is resting comfortably.  Physical Exam  BP 121/86 (BP Location: Left Arm)   Pulse (!) 108   Temp 98.3 F (36.8 C) (Oral)   Resp (!) 21   Ht 5\' 3"  (1.6 m)   Wt 93.9 kg   LMP  (LMP Unknown)   SpO2 95%   BMI 36.67 kg/m  Physical Exam Gen: No acute distress  Resp: Normal rise and fall of chest Neuro: Moving all four extremities Psych: Resting currently, calm and cooperative when awake    ED Course / MDM  EKG:   I have reviewed the labs performed to date as well as medications administered while in observation.  Recent changes in the last 24 hours include no acute events overnight.  Plan  Current plan is for social work evaluation for placement as patient does not want to go back to . Patient is not under full IVC at this time.   Branon Sabine, [employer], DO 01/27/21 [phone]

## 2021-01-27 NOTE — ED Notes (Signed)
Oxygen tank changed. Pt remains on baseline 2L [place]. Son sitting with pt. Pt eating dinner provided by son. Respirations even and unlabored in NAD. Pt and family informed to let this RN know if any needs are to arise.

## 2021-01-28 ENCOUNTER — Emergency Department: Payer: Medicare Other

## 2021-01-28 DIAGNOSIS — Z28310 Unvaccinated for covid-19: Secondary | ICD-10-CM | POA: Diagnosis not present

## 2021-01-28 DIAGNOSIS — E1142 Type 2 diabetes mellitus with diabetic polyneuropathy: Secondary | ICD-10-CM | POA: Diagnosis present

## 2021-01-28 DIAGNOSIS — U071 COVID-19: Secondary | ICD-10-CM | POA: Diagnosis present

## 2021-01-28 DIAGNOSIS — E669 Obesity, unspecified: Secondary | ICD-10-CM | POA: Diagnosis present

## 2021-01-28 DIAGNOSIS — E876 Hypokalemia: Secondary | ICD-10-CM | POA: Diagnosis present

## 2021-01-28 DIAGNOSIS — J1282 Pneumonia due to coronavirus disease 2019: Secondary | ICD-10-CM

## 2021-01-28 DIAGNOSIS — M797 Fibromyalgia: Secondary | ICD-10-CM | POA: Diagnosis present

## 2021-01-28 DIAGNOSIS — R0602 Shortness of breath: Secondary | ICD-10-CM | POA: Diagnosis present

## 2021-01-28 DIAGNOSIS — I10 Essential (primary) hypertension: Secondary | ICD-10-CM | POA: Diagnosis present

## 2021-01-28 DIAGNOSIS — Z86718 Personal history of other venous thrombosis and embolism: Secondary | ICD-10-CM | POA: Diagnosis not present

## 2021-01-28 DIAGNOSIS — J80 Acute respiratory distress syndrome: Secondary | ICD-10-CM | POA: Diagnosis present

## 2021-01-28 DIAGNOSIS — E1165 Type 2 diabetes mellitus with hyperglycemia: Secondary | ICD-10-CM | POA: Diagnosis present

## 2021-01-28 DIAGNOSIS — E872 Acidosis: Secondary | ICD-10-CM | POA: Diagnosis present

## 2021-01-28 DIAGNOSIS — Z79891 Long term (current) use of opiate analgesic: Secondary | ICD-10-CM | POA: Diagnosis not present

## 2021-01-28 DIAGNOSIS — E1169 Type 2 diabetes mellitus with other specified complication: Secondary | ICD-10-CM | POA: Diagnosis present

## 2021-01-28 DIAGNOSIS — G8929 Other chronic pain: Secondary | ICD-10-CM | POA: Diagnosis present

## 2021-01-28 DIAGNOSIS — E785 Hyperlipidemia, unspecified: Secondary | ICD-10-CM | POA: Diagnosis present

## 2021-01-28 DIAGNOSIS — Z7901 Long term (current) use of anticoagulants: Secondary | ICD-10-CM | POA: Diagnosis not present

## 2021-01-28 DIAGNOSIS — A4189 Other specified sepsis: Secondary | ICD-10-CM | POA: Diagnosis present

## 2021-01-28 DIAGNOSIS — I82401 Acute embolism and thrombosis of unspecified deep veins of right lower extremity: Secondary | ICD-10-CM

## 2021-01-28 DIAGNOSIS — Z86711 Personal history of pulmonary embolism: Secondary | ICD-10-CM | POA: Diagnosis not present

## 2021-01-28 DIAGNOSIS — Z6836 Body mass index (BMI) 36.0-36.9, adult: Secondary | ICD-10-CM | POA: Diagnosis not present

## 2021-01-28 DIAGNOSIS — Z79899 Other long term (current) drug therapy: Secondary | ICD-10-CM | POA: Diagnosis not present

## 2021-01-28 LAB — D-DIMER, QUANTITATIVE: D-Dimer, Quant: 0.48 ug/mL-FEU (ref 0.00–0.50)

## 2021-01-28 LAB — BASIC METABOLIC PANEL
Anion gap: 13 (ref 5–15)
BUN: 16 mg/dL (ref 8–23)
CO2: 32 mmol/L (ref 22–32)
Calcium: 8.7 mg/dL — ABNORMAL LOW (ref 8.9–10.3)
Chloride: 90 mmol/L — ABNORMAL LOW (ref 98–111)
Creatinine, Ser: 0.83 mg/dL (ref 0.44–1.00)
GFR, Estimated: >60 mL/min (ref >60)
Glucose, Bld: 298 mg/dL — ABNORMAL HIGH (ref 70–99)
Potassium: 3.4 mmol/L — ABNORMAL LOW (ref 3.5–5.1)
Sodium: 135 mmol/L (ref 135–145)

## 2021-01-28 LAB — CBC WITH DIFFERENTIAL/PLATELET
Abs Immature Granulocytes: 1.52 10*3/uL — ABNORMAL HIGH (ref 0.00–0.07)
Basophils Absolute: 0.1 10*3/uL (ref 0.0–0.1)
Basophils Relative: 1 %
Eosinophils Absolute: 0 10*3/uL (ref 0.0–0.5)
Eosinophils Relative: 0 %
HCT: 49.2 % — ABNORMAL HIGH (ref 36.0–46.0)
Hemoglobin: 15.5 g/dL — ABNORMAL HIGH (ref 12.0–15.0)
Immature Granulocytes: 9 %
Lymphocytes Relative: 8 %
Lymphs Abs: 1.3 10*3/uL (ref 0.7–4.0)
MCH: 29.4 pg (ref 26.0–34.0)
MCHC: 31.5 g/dL (ref 30.0–36.0)
MCV: 93.2 fL (ref 80.0–100.0)
Monocytes Absolute: 4.6 10*3/uL — ABNORMAL HIGH (ref 0.1–1.0)
Monocytes Relative: 26 %
Neutro Abs: 9.8 10*3/uL — ABNORMAL HIGH (ref 1.7–7.7)
Neutrophils Relative %: 56 %
Platelets: 102 10*3/uL — ABNORMAL LOW (ref 150–400)
RBC: 5.28 MIL/uL — ABNORMAL HIGH (ref 3.87–5.11)
RDW: 15.2 % (ref 11.5–15.5)
Smear Review: DECREASED
WBC: 17.4 10*3/uL — ABNORMAL HIGH (ref 4.0–10.5)
nRBC: 0 % (ref 0.0–0.2)

## 2021-01-28 LAB — RESP PANEL BY RT-PCR (FLU A&B, COVID) ARPGX2
Influenza A by PCR: NEGATIVE
Influenza B by PCR: NEGATIVE
SARS Coronavirus 2 by RT PCR: POSITIVE — AB

## 2021-01-28 LAB — PROCALCITONIN: Procalcitonin: 0.12 ng/mL

## 2021-01-28 LAB — GLUCOSE, CAPILLARY
Glucose-Capillary: 198 mg/dL — ABNORMAL HIGH (ref 70–99)
Glucose-Capillary: 328 mg/dL — ABNORMAL HIGH (ref 70–99)
Glucose-Capillary: 481 mg/dL — ABNORMAL HIGH (ref 70–99)
Glucose-Capillary: 516 mg/dL (ref 70–99)

## 2021-01-28 LAB — MAGNESIUM: Magnesium: 1.5 mg/dL — ABNORMAL LOW (ref 1.7–2.4)

## 2021-01-28 LAB — CBG MONITORING, ED: Glucose-Capillary: 227 mg/dL — ABNORMAL HIGH (ref 70–99)

## 2021-01-28 LAB — LACTIC ACID, PLASMA
Lactic Acid, Venous: 2.8 mmol/L (ref 0.5–1.9)
Lactic Acid, Venous: 2.4 mmol/L (ref 0.5–1.9)

## 2021-01-28 LAB — GLUCOSE, RANDOM: Glucose, Bld: 396 mg/dL — ABNORMAL HIGH (ref 70–99)

## 2021-01-28 LAB — BRAIN NATRIURETIC PEPTIDE: B Natriuretic Peptide: 30.1 pg/mL (ref 0.0–100.0)

## 2021-01-28 MED ORDER — INSULIN ASPART 100 UNIT/ML IJ SOLN
0.0000 [IU] | INTRAMUSCULAR | Status: DC
  Administered 2021-01-28: 3 [IU] via SUBCUTANEOUS
  Administered 2021-01-28: 5 [IU] via SUBCUTANEOUS
  Filled 2021-01-28 (×3): qty 1

## 2021-01-28 MED ORDER — SODIUM CHLORIDE 0.9 % IV SOLN
1.0000 g | INTRAVENOUS | Status: DC
  Administered 2021-01-28 – 2021-01-29 (×2): 1 g via INTRAVENOUS
  Filled 2021-01-28: qty 1
  Filled 2021-01-28: qty 10
  Filled 2021-01-28: qty 1

## 2021-01-28 MED ORDER — ACETAMINOPHEN 325 MG PO TABS: 650.0000 mg | ORAL_TABLET | Freq: Four times a day (QID) | ORAL | Status: AC | PRN

## 2021-01-28 MED ORDER — PREDNISONE 20 MG PO TABS: 50.0000 mg | ORAL_TABLET | Freq: Every day | ORAL | Status: DC

## 2021-01-28 MED ORDER — LACTATED RINGERS IV BOLUS
500.0000 mL | Freq: Once | INTRAVENOUS | Status: AC
  Administered 2021-01-28: 500 mL via INTRAVENOUS

## 2021-01-28 MED ORDER — HYDRALAZINE HCL 25 MG PO TABS
25.0000 mg | ORAL_TABLET | Freq: Three times a day (TID) | ORAL | Status: AC | PRN
  Administered 2021-01-29: 25 mg via ORAL
  Filled 2021-01-28: qty 1

## 2021-01-28 MED ORDER — INSULIN ASPART 100 UNIT/ML IJ SOLN
5.0000 [IU] | Freq: Once | INTRAMUSCULAR | Status: AC
  Administered 2021-01-29: 5 [IU] via SUBCUTANEOUS
  Filled 2021-01-28: qty 1

## 2021-01-28 MED ORDER — ONDANSETRON HCL 4 MG/2ML IJ SOLN
4.0000 mg | Freq: Four times a day (QID) | INTRAMUSCULAR | Status: DC | PRN
  Administered 2021-01-31: 4 mg via INTRAVENOUS
  Filled 2021-01-28: qty 2

## 2021-01-28 MED ORDER — INSULIN ASPART 100 UNIT/ML IJ SOLN
INTRAMUSCULAR | Status: AC
  Filled 2021-01-28: qty 1

## 2021-01-28 MED ORDER — INSULIN ASPART 100 UNIT/ML IJ SOLN
0.0000 [IU] | Freq: Every day | INTRAMUSCULAR | Status: DC
  Administered 2021-01-29: 3 [IU] via SUBCUTANEOUS
  Filled 2021-01-28 (×2): qty 1

## 2021-01-28 MED ORDER — GUAIFENESIN-DM 100-10 MG/5ML PO SYRP
10.0000 mL | ORAL_SOLUTION | ORAL | Status: DC | PRN
  Administered 2021-01-29: 10 mL via ORAL
  Filled 2021-01-28 (×2): qty 10

## 2021-01-28 MED ORDER — ACETAMINOPHEN 325 MG PO TABS
650.0000 mg | ORAL_TABLET | Freq: Once | ORAL | Status: AC
  Administered 2021-01-28: 650 mg via ORAL

## 2021-01-28 MED ORDER — MAGNESIUM SULFATE 2 GM/50ML IV SOLN
2.0000 g | Freq: Once | INTRAVENOUS | Status: AC
  Administered 2021-01-28: 2 g via INTRAVENOUS
  Filled 2021-01-28: qty 50

## 2021-01-28 MED ORDER — POLYETHYLENE GLYCOL 3350 17 G PO PACK: 17.0000 g | PACK | Freq: Every day | ORAL | Status: DC | PRN

## 2021-01-28 MED ORDER — NYSTATIN 100000 UNIT/ML MT SUSP
5.0000 mL | Freq: Four times a day (QID) | OROMUCOSAL | Status: DC
  Administered 2021-01-28 – 2021-02-01 (×14): 500000 [IU] via ORAL
  Filled 2021-01-28 (×17): qty 5

## 2021-01-28 MED ORDER — SODIUM CHLORIDE 0.9 % IV SOLN
200.0000 mg | Freq: Once | INTRAVENOUS | Status: AC
  Administered 2021-01-28: 200 mg via INTRAVENOUS
  Filled 2021-01-28 (×2): qty 40

## 2021-01-28 MED ORDER — HYDROCOD POLST-CPM POLST ER 10-8 MG/5ML PO SUER
5.0000 mL | Freq: Every evening | ORAL | Status: DC | PRN
Start: 2021-01-28 — End: 2021-02-01

## 2021-01-28 MED ORDER — INSULIN ASPART 100 UNIT/ML IJ SOLN
20.0000 [IU] | Freq: Once | INTRAMUSCULAR | Status: AC
  Administered 2021-01-28: 20 [IU] via SUBCUTANEOUS

## 2021-01-28 MED ORDER — SODIUM CHLORIDE 0.9 % IV SOLN
500.0000 mg | INTRAVENOUS | Status: DC
  Administered 2021-01-28 – 2021-01-29 (×2): 500 mg via INTRAVENOUS
  Filled 2021-01-28 (×3): qty 500

## 2021-01-28 MED ORDER — METHYLPREDNISOLONE SODIUM SUCC 125 MG IJ SOLR
0.5000 mg/kg | Freq: Two times a day (BID) | INTRAMUSCULAR | Status: AC
  Administered 2021-01-28 – 2021-01-31 (×6): 46.875 mg via INTRAVENOUS
  Filled 2021-01-28 (×6): qty 2

## 2021-01-28 MED ORDER — SODIUM CHLORIDE 0.9 % IV SOLN
100.0000 mg | Freq: Every day | INTRAVENOUS | Status: AC
  Administered 2021-01-29 – 2021-02-01 (×4): 100 mg via INTRAVENOUS
  Filled 2021-01-28 (×4): qty 100

## 2021-01-28 MED ORDER — PRAVASTATIN SODIUM 20 MG PO TABS
10.0000 mg | ORAL_TABLET | Freq: Every day | ORAL | Status: DC
  Administered 2021-01-29: 10 mg via ORAL
  Filled 2021-01-28: qty 1

## 2021-01-28 MED ORDER — ONDANSETRON HCL 4 MG PO TABS: 4.0000 mg | ORAL_TABLET | Freq: Four times a day (QID) | ORAL | Status: DC | PRN

## 2021-01-28 MED ORDER — POLYETHYLENE GLYCOL 3350 17 G PO PACK
17.0000 g | PACK | Freq: Every day | ORAL | Status: DC
  Administered 2021-01-28 – 2021-02-01 (×5): 17 g via ORAL
  Filled 2021-01-28 (×5): qty 1

## 2021-01-28 MED ORDER — ALBUTEROL SULFATE HFA 108 (90 BASE) MCG/ACT IN AERS
2.0000 | INHALATION_SPRAY | RESPIRATORY_TRACT | Status: AC
  Administered 2021-01-28 – 2021-01-30 (×9): 2 via RESPIRATORY_TRACT
  Filled 2021-01-28: qty 6.7

## 2021-01-28 MED ORDER — INSULIN ASPART 100 UNIT/ML IJ SOLN
0.0000 [IU] | Freq: Three times a day (TID) | INTRAMUSCULAR | Status: DC
  Administered 2021-01-29 (×2): 11 [IU] via SUBCUTANEOUS
  Administered 2021-01-29: 7 [IU] via SUBCUTANEOUS
  Administered 2021-01-30: 20 [IU] via SUBCUTANEOUS
  Administered 2021-01-30: 4 [IU] via SUBCUTANEOUS
  Administered 2021-01-30: 20 [IU] via SUBCUTANEOUS
  Administered 2021-01-31 (×2): 7 [IU] via SUBCUTANEOUS
  Administered 2021-01-31: 4 [IU] via SUBCUTANEOUS
  Administered 2021-02-01: 3 [IU] via SUBCUTANEOUS
  Filled 2021-01-28 (×10): qty 1

## 2021-01-28 NOTE — ED Notes (Signed)
Pt noted to take purewick out and left on bedside. New purewick placed and new O2 tank provided at this time.

## 2021-01-28 NOTE — ED Notes (Signed)
Lab at bedside

## 2021-01-28 NOTE — ED Provider Notes (Signed)
-----------------------------------------   5:13 AM on 01/28/2021 -----------------------------------------   Blood pressure 131/73, pulse (!) 110, temperature 98.4 F (36.9 C), temperature source Oral, resp. rate 16, height 5\' 3"  (1.6 m), weight 93.9 kg, SpO2 95 %.  The patient is calm and cooperative at this time.  There have been no acute events since the last update.  Awaiting disposition plan from Social Work team for placement.   , MD 01/28/21 [phone]

## 2021-01-28 NOTE — Progress Notes (Signed)
Pt arrival to unit. Pt on contact for COVID. Pt educated on isolation status. Bilateral stage 2 pressure injuries to feet. Mepilex applied to bilateral feet. Pt on 3 L via nasal cannula, chronically on 2 L. Pt denies any other needs/concerns at this time. Monitoring.

## 2021-01-28 NOTE — ED Notes (Signed)
Pt reporting headache and asking for medication for pain. MD made aware.

## 2021-01-28 NOTE — ED Provider Notes (Signed)
Passing.  This patient approximately 0 700.  In brief patient presents from the scene facility accompanied by son was unhappy with the care she was receiving after recently being discharged on 2 L for hypoxic respiratory failure secondary pneumonia.  On my assessment patient states she has been coughing little more in the last day or so while in the ED and per RN he did have to increase her oxygen from 2 L to 3 L maintaining sats.  Repeat chest x-ray shows improving aeration of both lungs but patient is tachycardic today.  We will obtain a COVID flu and basic screening labs.  Patient is COVID-positive.  CBC shows improving leukocytosis from 6 days ago at 17.4 compared to 29.8.  No acute anemia.  BMP shows a K of 3.4 and a glucose of 298.  She is started on sliding scale insulin.  Magnesium 1.5.  Repletion ordered.  BMP double digits and overall Evalose patient for acute heart failure exacerbation.  Will give patient dose of Decadron and started on remdesivir.  Will admit to hospital service for acute on chronic hypoxic respiratory failure secondary to COVID-pneumonia.  .Critical Care Performed by: Gilles Chiquito, MD Authorized by: Gilles Chiquito, MD   Critical care provider statement:    Critical care time (minutes):  45   Critical care time was exclusive of:  Separately billable procedures and treating other patients   Critical care was necessary to treat or prevent imminent or life-threatening deterioration of the following conditions:  Respiratory failure   Critical care was time spent personally by me on the following activities:  Discussions with consultants, evaluation of patient's response to treatment, examination of patient, ordering and performing treatments and interventions, ordering and review of laboratory studies, ordering and review of radiographic studies, pulse oximetry, re-evaluation of patient's condition, obtaining history from patient or surrogate and review of old charts       Gilles Chiquito, MD 01/28/21 1458

## 2021-01-28 NOTE — Progress Notes (Signed)
Remdesivir - Pharmacy Brief Note   O:  CXR: Persistent bibasilar opacities. SpO2: 93% on 3 L [place]   A/P:  Remdesivir 200 mg IVPB once followed by 100 mg IVPB daily x 4 days.   Laureen Ochs, PharmD 01/28/2021 2:32 PM

## 2021-01-28 NOTE — ED Notes (Signed)
Dr Katrinka Blazing and Charge RN made aware of positive covid

## 2021-01-28 NOTE — H&P (Signed)
History and Physical   Elizabeth Rowland HXT:[id number] DOB: [birth year] DOA: 01/26/2021  PCP: Marguerita Merles, MD  Outpatient Specialists: Dr. Jefm Bryant, rheumatology Patient coming from: [place]  I have personally briefly reviewed patient's old medical records in [place].  Chief Concern: Shortness of breath  HPI: Elizabeth Rowland is a 80 y.o. female with medical history significant for fibromyalgia, hypertension, hyperlipidemia, currently on tramadol and gabapentin for chronic pain and fibromyalgia, currently not taking any more anti-hypertensives and hyperlipidemia, history of right peritoneal DVT, bilateral upper lobe PE, presents to the emergency department on 01/26/2021 from facility due to sudden refusing patient to call back to the [facility].  Per EHR notes, son reports significant frustration with [facility] due to reported poor treatment of his mother.  He reports that they have not been appropriately caring for patient.  Patient was holding in the emergency department for several days pending new facility.  On day of hospitalization, patient was noted to have hypoxia and requiring increased nasal cannula use.  EDP ordered repeat COVID test and it was positive.  Note: Last COVID test were 3 weeks ago and 6 days ago and they were negative.  At bedside patient is awake alert and oriented to herself, age, location of hospital, and current calendar year.  She reports that she is not hurting beyond the baseline right rib pain.  She denies shortness of breath, headaches, vision changes, dysuria, hematuria, abdominal pain.  She denies anything bothering her.  She states that she was unhappy at the facility because did not do anything for her.  She states that they did feed her however she did not eat anything because she did not like the food.  When I asked her for specification regarding why the SNF facility was still intolerable to the patient, she  was not able to answer me.  She states that I should just go to the facility and find out for myself.  She reports baseline right-sided rib pain that has been present for many years   Social history: She lives at home with her son.  She denies tobacco, EtOH, recreational drug use.  She is currently retired and formerly worked in a [employer].  Vaccination history: She is not vaccinated for COVID-19  ROS: Constitutional: no weight change, no fever ENT/Mouth: no sore throat, no rhinorrhea Eyes: no eye pain, no vision changes Cardiovascular: no chest pain, no dyspnea,  no edema, no palpitations Respiratory: no cough, no sputum, no wheezing Gastrointestinal: no nausea, no vomiting, no diarrhea, no constipation Genitourinary: no urinary incontinence, no dysuria, no hematuria Musculoskeletal: no arthralgias, no myalgias Skin: no skin lesions, no pruritus, Neuro: + weakness, no loss of consciousness, no syncope Psych: no anxiety, no depression, + decrease appetite Heme/Lymph: no bruising, no bleeding  ED Course: Discussed with EDP, patient requiring hospitalization for hypoxia secondary to COVID-19.  Vitals in the emergency department was remarkable for temperature of 98.5, respiration rate of 18, heart rate 113, blood pressure 132/44, SPO2 of 93% on 3 L nasal cannula.  Her urine output was documented to be 1100 in the last 24 hours.  EDP ordered remdesivir IV for 5 days.  Labs in the emergency department was remarkable for potassium 3.4, chloride 90, bicarb 32, BUN 16, serum creatinine of 0.83, nonfasting blood glucose 298, magnesium 1.5, WBC 17.4, hemoglobin 15.5, platelets 102.  COVID PCR was positive.  Influenza A and B PCR were negative.  Assessment/Plan  Principal Problem:  Pneumonia due to COVID-19 virus Active Problems:   Hyperlipidemia   Fibromyalgia   Essential hypertension   Acute respiratory failure with hypoxia (HCC)   Pulmonary emboli (HCC)   Hypomagnesemia    Hypokalemia   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HCC)   Acute hypoxemic respiratory failure secondary to COVID-19 pneumonia Met sepsis criteria-secondary to COVID-19 in setting of facility experiencing a COVID-19 outbreak - Check lactic acid x2 - EDP ordered IV remdesivir per pharmacy,  - As patient is requiring increased oxygen supplementation, I have ordered IV solumedrol 1 g/kg IV q12h with titration to p.o. - Superimposed bacterial infection cannot be excluded, checking procalcitonin  - Incentive spirometry and flutter valve for 10 reps every 2 hours while awake - Albuterol inhaler 2 puffs every 4 hours while awake - Daily labs: CMP, CBC, CRP, D-dimer - Supplemental oxygen to maintain SPO2 goal of greater than 88% - Airborne and contact precautions  Hyperglycemia- checking A1c  -Suspect patient has now developed diabetes due to steroid use over the course of the last month - Insulin sliding scale for steroid use, with at bedtime coverage ordered  History of hypertension-patient previously on lisinopril-hydrochlorothiazide 20-25 daily and at this time patient is maintaining SBP of 113-128 -Hydralazine 25 mg every 8 hours as needed for SBP greater than 170, 3 days ordered  Fibromyalgia/chronic pain-resumed home tramadol 100 mg p.o. 3 times daily  Neuropathy- gabapentin 300 mg p.o. 3 times daily ordered  History of bilateral segmental and subsegmental pulmonary emboli Right peroneal DVT -Resumed home Eliquis 5 mg twice daily -D-dimer on admission was 0.48, therefore no indication for repeat CTA at this time  Hyperlipidemia-pravastatin 10 mg daily resumed  TOC consulted: Per son, they lost her medications for three days, including not giving her eliquis for three days. They didn't change her sheets or clothing despite being soaked in urine. Per son, they told patient to " go ahead 'shit' on yourself and we'll change you later"  PT/OT consulted  Chart reviewed.   Hospitalization from 01/06/2021 to  01/22/2021: For severe sepsis with acute organ dysfunction secondary to acute respiratory failure with hypoxia and bilateral pulmonary embolism - At that time patient tested negative for COVID PCR - On 01/22/2021, patient also tested negative for COVID PCR prior to discharge to SNF  DVT prophylaxis: Eliquis 5 mg twice daily Code Status: Full code Diet: Heart healthy/carb modified Family Communication: Called and updated son, Lella Mullany, at [phone] Disposition Plan: Pending clinical course Consults called: None at this time Admission status: Inpatient, MedSurg, with telemetry for 24 hours  History reviewed. No pertinent past medical history.  History reviewed. No pertinent surgical history.  Social History:  reports that she has never smoked. She has never used smokeless tobacco. She reports that she does not drink alcohol and does not use drugs.  Not on File History reviewed. No pertinent family history. Family history: Family history reviewed and not pertinent  Prior to Admission medications   Medication Sig Start Date End Date Taking? Authorizing Provider  furosemide (LASIX) 40 MG tablet Take 1 tablet (40 mg total) by mouth daily. 01/19/21 01/19/22 Yes Rai, Ripudeep K, MD  gabapentin (NEURONTIN) 300 MG capsule Take 1 capsule by mouth 3 (three) times daily. 11/24/20  Yes [provider]  Multiple Vitamin (MULTIVITAMIN WITH MINERALS) TABS tablet Take 1 tablet by mouth daily. 01/19/21  Yes Rai, Ripudeep K, MD  nystatin (MYCOSTATIN) 100000 UNIT/ML suspension Take 5 mLs (500,000 Units total) by mouth 4 (four) times daily. X 10  days 01/18/21  Yes Rai, Ripudeep K, MD  potassium chloride SA (KLOR-CON) 20 MEQ tablet Take 1 tablet (20 mEq total) by mouth daily. 01/22/21  Yes Rai, Ripudeep K, MD  predniSONE (DELTASONE) 10 MG tablet Take prednisone 28m PO daily for 1 more day, then taper to 138mdaily for 3 days, then OFF 01/22/21  Yes Rai, Ripudeep K, MD  traMADol (ULTRAM) 50 MG tablet Take 2  tablets (100 mg total) by mouth in the morning, at noon, and at bedtime. 01/22/21  Yes Rai, Ripudeep K, MD  apixaban (ELIQUIS) 5 MG TABS tablet Take 1 tablet (5 mg total) by mouth 2 (two) times daily. 01/18/21   Rai, RiVernelle EmeraldMD  docusate sodium (COLACE) 100 MG capsule Take 100 mg by mouth 2 (two) times daily.    [provider]  polyethylene glycol (MIRALAX / GLYCOLAX) 17 g packet Take 17 g by mouth daily as needed for mild constipation, moderate constipation or severe constipation.    [provider]  pravastatin (PRAVACHOL) 10 MG tablet Take 10 mg by mouth daily. 03/26/14   [provider]   Physical Exam: Vitals:   01/27/21 1012 01/27/21 1507 01/28/21 0034 01/28/21 1011  BP: 124/75 114/73 131/73 (!) 132/44  Pulse: 95 100 (!) 110 (!) 113  Resp: _0 Temp: 98.4 F (36.9 C)   98.5 F (36.9 C)  TempSrc: Oral   Oral  SpO2: 96% 96% 95% 93%  Weight:      Height:       Constitutional: appears age appropriate, poorly kept, NAD, calm, comfortable Eyes: PERRL, lids and conjunctivae normal ENMT: Mucous membranes are moist. Posterior pharynx clear of any exudate or lesions. Age-appropriate dentition. Hearing appropriate Neck: normal, supple, no masses, no thyromegaly Respiratory: clear to auscultation bilaterally, no wheezing, no crackles. Normal respiratory effort. No accessory muscle use.  Cardiovascular: Regular rate and rhythm, no murmurs / rubs / gallops. No extremity edema. 2+ pedal pulses. No carotid bruits.  Abdomen: obese abdomen, no tenderness, no masses palpated, no hepatosplenomegaly. Bowel sounds positive.  Musculoskeletal: no clubbing / cyanosis. No joint deformity upper and lower extremities. Good ROM, no contractures, no atrophy. Normal muscle tone.  Skin: no rashes, lesions, ulcers. No induration. Bilateral skin discoloration and changes - that is chronic for patient Neurologic: Sensation intact. Strength 5/5 in all 4.  Psychiatric: Normal  judgment and insight. Alert and oriented x 3. Normal mood.   EKG: Not indicated at this time  Chest x-ray on Admission: I personally reviewed and I agree with radiologist reading as below.  DG Chest Portable 1 View  Result Date: 01/28/2021 CLINICAL DATA:  Cough. EXAM: PORTABLE CHEST 1 VIEW COMPARISON:  01/08/2021 FINDINGS: Heart size is normal. Patchy parenchymal opacities are identified in the LOWER lobes slightly improved from previous exam. There has been significant improvement in aeration of the UPPER lobes. IMPRESSION: Persistent bibasilar opacities. Electronically Signed   By: ElNolon Nations.D.   On: 01/28/2021 13:48   Labs on Admission: I have personally reviewed following labs  CBC: Recent Labs  Lab 01/22/21 0624 01/28/21 1257  WBC 29.8* 17.4*  NEUTROABS  --  9.8*  HGB 15.4* 15.5*  HCT 47.7* 49.2*  MCV 92.6 93.2  PLT 147* 10245  Basic Metabolic Panel: Recent Labs  Lab 01/22/21 0624 01/28/21 1257  NA 136 135  K 3.2* 3.4*  CL 94* 90*  CO2 32 32  GLUCOSE 118* 298*  BUN 27* 16  CREATININE 0.71 0.83  CALCIUM 8.5* 8.7*  MG  --  1.5*   GFR: Estimated Creatinine Clearance: 57.9 mL/min (by C-G formula based on SCr of 0.83 mg/dL).  CBG: Recent Labs  Lab 01/28/21 1414  GLUCAP 227*   Urine analysis:    Component Value Date/Time   COLORURINE AMBER (A) 01/06/2021 0723   APPEARANCEUR CLEAR (A) 01/06/2021 0723   LABSPEC >1.046 (H) 01/06/2021 0723   PHURINE 6.0 01/06/2021 0723   GLUCOSEU NEGATIVE 01/06/2021 0723   HGBUR NEGATIVE 01/06/2021 0723   BILIRUBINUR (A) 01/06/2021 0723    TEST NOT REPORTED DUE TO COLOR INTERFERENCE OF URINE PIGMENT   KETONESUR NEGATIVE 01/06/2021 0723   PROTEINUR NEGATIVE 01/06/2021 0723   NITRITE (A) 01/06/2021 0723    TEST NOT REPORTED DUE TO COLOR INTERFERENCE OF URINE PIGMENT   LEUKOCYTESUR NEGATIVE 01/06/2021 0723   Luzmaria Devaux N Shateka Petrea D.O. [facility]  If 7PM-7AM, please contact overnight-coverage provider If 7AM-7PM,  please contact day coverage provider [email or web address]  01/28/2021, 2:59 PM

## 2021-01-28 NOTE — ED Notes (Signed)
Pt resting in NAD. Remains hooked up to purewick. Will continue to monitor.

## 2021-01-28 NOTE — ED Notes (Signed)
Pt brief clean and dry. Purewick remains in place functioning properly. Pt denies needs at this time.

## 2021-01-28 NOTE — ED Notes (Signed)
Pt given meal tray. Pt oxygen tank replaced. Pt able to feed self. Denies further needs. Has cell phone at bedside talking to family.

## 2021-01-29 LAB — C-REACTIVE PROTEIN: CRP: 9.7 mg/dL — ABNORMAL HIGH (ref <1.0)

## 2021-01-29 LAB — COMPREHENSIVE METABOLIC PANEL
ALT: 94 U/L — ABNORMAL HIGH (ref 0–44)
AST: 84 U/L — ABNORMAL HIGH (ref 15–41)
Albumin: 2.8 g/dL — ABNORMAL LOW (ref 3.5–5.0)
Alkaline Phosphatase: 54 U/L (ref 38–126)
Anion gap: 11 (ref 5–15)
BUN: 15 mg/dL (ref 8–23)
CO2: 31 mmol/L (ref 22–32)
Calcium: 8.2 mg/dL — ABNORMAL LOW (ref 8.9–10.3)
Chloride: 96 mmol/L — ABNORMAL LOW (ref 98–111)
Creatinine, Ser: 0.7 mg/dL (ref 0.44–1.00)
GFR, Estimated: >60 mL/min (ref >60)
Glucose, Bld: 187 mg/dL — ABNORMAL HIGH (ref 70–99)
Potassium: 3.7 mmol/L (ref 3.5–5.1)
Sodium: 138 mmol/L (ref 135–145)
Total Bilirubin: 0.7 mg/dL (ref 0.3–1.2)
Total Protein: 5.7 g/dL — ABNORMAL LOW (ref 6.5–8.1)

## 2021-01-29 LAB — LACTIC ACID, PLASMA
Lactic Acid, Venous: 1.3 mmol/L (ref 0.5–1.9)
Lactic Acid, Venous: 6.1 mmol/L (ref 0.5–1.9)
Lactic Acid, Venous: 4.4 mmol/L (ref 0.5–1.9)
Lactic Acid, Venous: 3.2 mmol/L (ref 0.5–1.9)

## 2021-01-29 LAB — CBC WITH DIFFERENTIAL/PLATELET
Abs Immature Granulocytes: 1.42 10*3/uL — ABNORMAL HIGH (ref 0.00–0.07)
Basophils Absolute: 0.1 10*3/uL (ref 0.0–0.1)
Basophils Relative: 1 %
Eosinophils Absolute: 0 10*3/uL (ref 0.0–0.5)
Eosinophils Relative: 0 %
HCT: 44.8 % (ref 36.0–46.0)
Hemoglobin: 14.4 g/dL (ref 12.0–15.0)
Immature Granulocytes: 10 %
Lymphocytes Relative: 13 %
Lymphs Abs: 1.9 10*3/uL (ref 0.7–4.0)
MCH: 29.1 pg (ref 26.0–34.0)
MCHC: 32.1 g/dL (ref 30.0–36.0)
MCV: 90.7 fL (ref 80.0–100.0)
Monocytes Absolute: 1.8 10*3/uL — ABNORMAL HIGH (ref 0.1–1.0)
Monocytes Relative: 12 %
Neutro Abs: 9.6 10*3/uL — ABNORMAL HIGH (ref 1.7–7.7)
Neutrophils Relative %: 64 %
Platelets: 71 10*3/uL — ABNORMAL LOW (ref 150–400)
RBC: 4.94 MIL/uL (ref 3.87–5.11)
RDW: 15 % (ref 11.5–15.5)
Smear Review: NORMAL
WBC: 14.8 10*3/uL — ABNORMAL HIGH (ref 4.0–10.5)
nRBC: 0 % (ref 0.0–0.2)

## 2021-01-29 LAB — GLUCOSE, CAPILLARY
Glucose-Capillary: 255 mg/dL — ABNORMAL HIGH (ref 70–99)
Glucose-Capillary: 280 mg/dL — ABNORMAL HIGH (ref 70–99)
Glucose-Capillary: 201 mg/dL — ABNORMAL HIGH (ref 70–99)
Glucose-Capillary: 288 mg/dL — ABNORMAL HIGH (ref 70–99)

## 2021-01-29 LAB — HEMOGLOBIN A1C
Hgb A1c MFr Bld: 8 % — ABNORMAL HIGH (ref 4.8–5.6)
Mean Plasma Glucose: 182.9 mg/dL

## 2021-01-29 LAB — PROCALCITONIN: Procalcitonin: 0.14 ng/mL

## 2021-01-29 LAB — MAGNESIUM: Magnesium: 2 mg/dL (ref 1.7–2.4)

## 2021-01-29 LAB — PHOSPHORUS: Phosphorus: 3.6 mg/dL (ref 2.5–4.6)

## 2021-01-29 LAB — D-DIMER, QUANTITATIVE: D-Dimer, Quant: 0.38 ug/mL-FEU (ref 0.00–0.50)

## 2021-01-29 MED ORDER — INSULIN ASPART 100 UNIT/ML IJ SOLN
3.0000 [IU] | Freq: Three times a day (TID) | INTRAMUSCULAR | Status: DC
  Administered 2021-01-29 – 2021-02-01 (×8): 3 [IU] via SUBCUTANEOUS
  Filled 2021-01-29 (×8): qty 1

## 2021-01-29 MED ORDER — SODIUM CHLORIDE 0.45 % IV SOLN: INTRAVENOUS | Status: DC

## 2021-01-29 MED ORDER — INSULIN GLARGINE 100 UNIT/ML ~~LOC~~ SOLN
12.0000 [IU] | Freq: Two times a day (BID) | SUBCUTANEOUS | Status: DC
  Administered 2021-01-29 – 2021-01-30 (×3): 12 [IU] via SUBCUTANEOUS
  Filled 2021-01-29 (×4): qty 0.12

## 2021-01-29 NOTE — NC FL2 (Signed)
[place] MEDICAID FL2 LEVEL OF CARE SCREENING TOOL     IDENTIFICATION  Patient Name: Elizabeth Rowland Birthdate: 1941/08/30 Sex: female Admission Date (Current Location): 01/26/2021  [place] and [place] Number:  Chiropodist and Address:  [facility], [address] [place], [place] [address]      Provider Number: [id number]  Attending Physician Name and Address:  Lynn Ito, MD  Relative Name and Phone Number:  Sheina, Mcleish (YQI)   [phone] [facility] Phone)    Current Level of Care: Hospital Recommended Level of Care: Skilled Nursing Facility Prior Approval Number:    Date Approved/Denied:   PASRR Number: [id number]  Discharge Plan: SNF    Current Diagnoses: Patient Active Problem List   Diagnosis Date Noted  . Pneumonia due to COVID-19 virus 01/28/2021  . Hypomagnesemia 01/28/2021  . Hypokalemia 01/28/2021  . 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HCC) 01/28/2021  . Community acquired pneumonia   . Lung mass   . Acute respiratory failure with hypoxia (HCC) 01/07/2021  . Acute respiratory distress syndrome (ARDS) due to COVID-19 virus (HCC) 01/07/2021  . Pulmonary emboli (HCC) 01/07/2021  . Severe sepsis with acute organ dysfunction (HCC) 01/06/2021  . Hyperlipidemia 01/06/2021  . Fibromyalgia 01/06/2021  . Essential hypertension 01/06/2021    Orientation RESPIRATION BLADDER Height & Weight     Self,Place  O2 (3L ) Continent,Incontinent Weight: 93.9 kg Height:  5\' 3"  (160 cm)  BEHAVIORAL SYMPTOMS/MOOD NEUROLOGICAL BOWEL NUTRITION STATUS      Continent Diet (Heart Healthy Carb modified)  AMBULATORY STATUS COMMUNICATION OF NEEDS Skin   Extensive Assist Verbally PU Stage and Appropriate Care                       Personal Care Assistance Level of Assistance  Bathing,Feeding,Dressing Bathing Assistance: Maximum assistance Feeding assistance: Limited assistance Dressing Assistance: Maximum assistance     Functional Limitations  Info             SPECIAL CARE FACTORS FREQUENCY  PT (By licensed PT),OT (By licensed OT)     PT Frequency: 5x/week OT Frequency: 5x/week            Contractures Contractures Info: Not present    Additional Factors Info  Code Status,Allergies Code Status Info: Full Allergies Info: NKDA           Current Medications (01/29/2021):  This is the current hospital active medication list Current Facility-Administered Medications  Medication Dose Route Frequency Provider Last Rate Last Admin  . 0.45 % sodium chloride infusion   Intravenous Continuous 01/31/2021, MD      . acetaminophen (TYLENOL) tablet 650 mg  650 mg Oral Q6H PRN Cox, Amy N, DO      . albuterol (VENTOLIN HFA) 108 (90 Base) MCG/ACT inhaler 2 puff  2 puff Inhalation Q4H while awake Cox, Amy N, DO   2 puff at 01/29/21 1254  . apixaban (ELIQUIS) tablet 5 mg  5 mg Oral BID Cox, Amy N, DO   5 mg at 01/29/21 0933  . azithromycin (ZITHROMAX) 500 mg in sodium chloride 0.9 % 250 mL IVPB  500 mg Intravenous Q24H Cox, Amy N, DO   Stopped at 01/28/21 2307  . cefTRIAXone (ROCEPHIN) 1 g in sodium chloride 0.9 % 100 mL IVPB  1 g Intravenous Q24H Cox, Amy N, DO   Stopped at 01/28/21 2038  . chlorpheniramine-HYDROcodone (TUSSIONEX) 10-8 MG/5ML suspension 5 mL  5 mL Oral QHS PRN Cox, Amy N, DO      .  gabapentin (NEURONTIN) capsule 300 mg  300 mg Oral TID Cox, Amy N, DO   300 mg at 01/29/21 0933  . guaiFENesin-dextromethorphan (ROBITUSSIN DM) 100-10 MG/5ML syrup 10 mL  10 mL Oral Q4H PRN Cox, Amy N, DO   10 mL at 01/29/21 0933  . hydrALAZINE (APRESOLINE) tablet 25 mg  25 mg Oral Q8H PRN Cox, Amy N, DO   25 mg at 01/29/21 0932  . insulin aspart (novoLOG) injection 0-20 Units  0-20 Units Subcutaneous TID WC Cox, Amy N, DO   11 Units at 01/29/21 1255  . insulin aspart (novoLOG) injection 0-5 Units  0-5 Units Subcutaneous QHS Cox, Amy N, DO      . insulin aspart (novoLOG) injection 3 Units  3 Units Subcutaneous TID WC Lynn Ito, MD       . insulin glargine (LANTUS) injection 12 Units  12 Units Subcutaneous BID Lynn Ito, MD      . methylPREDNISolone sodium succinate (SOLU-MEDROL) 125 mg/2 mL injection 46.875 mg  0.5 mg/kg Intravenous Q12H Cox, Amy N, DO   46.875 mg at 01/29/21 0410   Followed by  . [START ON 01/31/2021] predniSONE (DELTASONE) tablet 50 mg  50 mg Oral Daily Cox, Amy N, DO      . multivitamin with minerals tablet 1 tablet  1 tablet Oral Daily Cox, Amy N, DO   1 tablet at 01/29/21 0933  . nystatin (MYCOSTATIN) 100000 UNIT/ML suspension 500,000 Units  5 mL Oral QID Cox, Amy N, DO   500,000 Units at 01/29/21 1254  . ondansetron (ZOFRAN) tablet 4 mg  4 mg Oral Q6H PRN Cox, Amy N, DO       Or  . ondansetron (ZOFRAN) injection 4 mg  4 mg Intravenous Q6H PRN Cox, Amy N, DO      . polyethylene glycol (MIRALAX / GLYCOLAX) packet 17 g  17 g Oral Daily Cox, Amy N, DO   17 g at 01/29/21 0934  . polyethylene glycol (MIRALAX / GLYCOLAX) packet 17 g  17 g Oral Daily PRN Cox, Amy N, DO      . potassium chloride SA (KLOR-CON) CR tablet 20 mEq  20 mEq Oral Daily Cox, Amy N, DO   20 mEq at 01/29/21 0933  . pravastatin (PRAVACHOL) tablet 10 mg  10 mg Oral q1800 Cox, Amy N, DO      . remdesivir 100 mg in sodium chloride 0.9 % 100 mL IVPB  100 mg Intravenous Daily Cox, Amy N, DO 200 mL/hr at 01/29/21 0932 100 mg at 01/29/21 0932  . traMADol (ULTRAM) tablet 100 mg  100 mg Oral TID Cox, Amy N, DO   100 mg at 01/29/21 1254     Discharge Medications: Please see discharge summary for a list of discharge medications.  Relevant Imaging Results:  Relevant Lab Results:   Additional Information SS #: [id number]  Chapman Fitch, RN

## 2021-01-29 NOTE — Progress Notes (Addendum)
PROGRESS NOTE    Elizabeth Rowland  YCX:[id number] DOB: [birth year] DOA: 01/26/2021 PCP: Marguerita Merles, MD    Brief Narrative:  Elizabeth Rowland is a 80 y.o. female with medical history significant for fibromyalgia, hypertension, hyperlipidemia, currently on tramadol and gabapentin for chronic pain and fibromyalgia, currently not taking any more anti-hypertensives and hyperlipidemia, history of right peritoneal DVT, bilateral upper lobe PE, presents to the emergency department on 01/26/2021 from facility due to sudden refusing patient to call back to the [facility].  Per EHR notes, son reports significant frustration with [facility] due to reported poor treatment of his mother.  He reports that they have not been appropriately caring for patient.  Patient was holding in the emergency department for several days pending new facility.  On day of hospitalization, patient was noted to have hypoxia and requiring increased nasal cannula use.  EDP ordered repeat COVID test and it was positive. She reports baseline right-sided rib pain that has been present for many years   5/16- with PT pt dropped from 95% to 84% on 3L , oxygen was bumped off with 4 L without improvement, and then bumped up to 5 L and able to sustain 89 to 92%  Consultants:     Procedures:   Antimicrobials:   Remdesivir    Subjective: Eating breakfast, denies chest pain .  Reports become short of breath with movement    objective: Vitals:   01/28/21 1625 01/28/21 1934 01/29/21 0026 01/29/21 0415  BP: 128/67 113/64 110/65 102/65  Pulse: (!) 117 (!) 118 92 85  Resp: 20 (!) 24 (!) 22 20  Temp: 99.6 F (37.6 C) 97.7 F (36.5 C) 97.8 F (36.6 C) 97.7 F (36.5 C)  TempSrc: Oral Oral Oral Oral  SpO2: 95% 92% 94% 95%  Weight:      Height:        Intake/Output Summary (Last 24 hours) at 01/29/2021 4970 Last data filed at 01/29/2021 2637 Gross per 24 hour  Intake 410 ml  Output 1100 ml   Net -690 ml   Filed Weights   01/26/21 1713  Weight: 93.9 kg    Examination:  General exam: Appears calm and comfortable, eating breakfast Respiratory system: Crackles scattered, no wheezing Cardiovascular system: S1 & S2 heard, RRR. No JVD, murmurs, rubs, gallops or clicks.  Gastrointestinal system: Abdomen is nondistended, soft and nontender.  Normal bowel sounds heard. Central nervous system: Alert and oriented.  Grossly intact Extremities: No edema Skin: Warm dry Psychiatry:  Mood & affect appropriate.     Data Reviewed: I have personally reviewed following labs and imaging studies  CBC: Recent Labs  Lab 01/28/21 1257 01/29/21 0435  WBC 17.4* 14.8*  NEUTROABS 9.8* 9.6*  HGB 15.5* 14.4  HCT 49.2* 44.8  MCV 93.2 90.7  PLT 102* 71*   Basic Metabolic Panel: Recent Labs  Lab 01/28/21 1257 01/28/21 1956 01/29/21 0435  NA 135  --  138  K 3.4*  --  3.7  CL 90*  --  96*  CO2 32  --  31  GLUCOSE 298* 396* 187*  BUN 16  --  15  CREATININE 0.83  --  0.70  CALCIUM 8.7*  --  8.2*  MG 1.5*  --  2.0  PHOS  --   --  3.6   GFR: Estimated Creatinine Clearance: 60.1 mL/min (by C-G formula based on SCr of 0.7 mg/dL). Liver Function Tests: Recent Labs  Lab 01/29/21 0435  AST 84*  ALT 94*  ALKPHOS 54  BILITOT 0.7  PROT 5.7*  ALBUMIN 2.8*   No results for input(s): LIPASE, AMYLASE in the last 168 hours. No results for input(s): AMMONIA in the last 168 hours. Coagulation Profile: No results for input(s): INR, PROTIME in the last 168 hours. Cardiac Enzymes: No results for input(s): CKTOTAL, CKMB, CKMBINDEX, TROPONINI in the last 168 hours. BNP (last 3 results) No results for input(s): PROBNP in the last 8760 hours. HbA1C: Recent Labs    01/28/21 2010  HGBA1C 8.0*   CBG: Recent Labs  Lab 01/28/21 1838 01/28/21 1936 01/28/21 2215 01/28/21 2338 01/29/21 0808  GLUCAP 198* 516* 481* 328* 255*   Lipid Profile: No results for input(s): CHOL, HDL, LDLCALC,  TRIG, CHOLHDL, LDLDIRECT in the last 72 hours. Thyroid Function Tests: No results for input(s): TSH, T4TOTAL, FREET4, T3FREE, THYROIDAB in the last 72 hours. Anemia Panel: No results for input(s): VITAMINB12, FOLATE, FERRITIN, TIBC, IRON, RETICCTPCT in the last 72 hours. Sepsis Labs: Recent Labs  Lab 01/28/21 1257 01/28/21 1457 01/28/21 1757 01/29/21 0435  PROCALCITON 0.12  --   --  0.14  LATICACIDVEN  --  2.8* 2.4*  --     Recent Results (from the past 240 hour(s))  Resp Panel by RT-PCR (Flu A&B, Covid) Nasopharyngeal Swab     Status: None   Collection Time: 01/22/21 11:43 AM   Specimen: Nasopharyngeal Swab; Nasopharyngeal(NP) swabs in vial transport medium  Result Value Ref Range Status   SARS Coronavirus 2 by RT PCR NEGATIVE NEGATIVE Final    Comment: (NOTE) SARS-CoV-2 target nucleic acids are NOT DETECTED.  The SARS-CoV-2 RNA is generally detectable in upper respiratory specimens during the acute phase of infection. The lowest concentration of SARS-CoV-2 viral copies this assay can detect is 138 copies/mL. A negative result does not preclude SARS-Cov-2 infection and should not be used as the sole basis for treatment or other patient management decisions. A negative result may occur with  improper specimen collection/handling, submission of specimen other than nasopharyngeal swab, presence of viral mutation(s) within the areas targeted by this assay, and inadequate number of viral copies(<138 copies/mL). A negative result must be combined with clinical observations, patient history, and epidemiological information. The expected result is Negative.  Fact Sheet for Patients:  EntrepreneurPulse.com.au  Fact Sheet for Healthcare Providers:  IncredibleEmployment.be  This test is no t yet approved or cleared by the [place] FDA and  has been authorized for detection and/or diagnosis of SARS-CoV-2 by FDA under an Emergency Use  Authorization (EUA). This EUA will remain  in effect (meaning this test can be used) for the duration of the COVID-19 declaration under Section 564(b)(1) of the Act, 21 U.S.C.section 360bbb-3(b)(1), unless the authorization is terminated  or revoked sooner.       Influenza A by PCR NEGATIVE NEGATIVE Final   Influenza B by PCR NEGATIVE NEGATIVE Final    Comment: (NOTE) The Xpert Xpress SARS-CoV-2/FLU/RSV plus assay is intended as an aid in the diagnosis of influenza from Nasopharyngeal swab specimens and should not be used as a sole basis for treatment. Nasal washings and aspirates are unacceptable for Xpert Xpress SARS-CoV-2/FLU/RSV testing.  Fact Sheet for Patients: EntrepreneurPulse.com.au  Fact Sheet for Healthcare Providers: IncredibleEmployment.be  This test is not yet approved or cleared by the [place] FDA and has been authorized for detection and/or diagnosis of SARS-CoV-2 by FDA under an Emergency Use Authorization (EUA). This EUA will remain in effect (meaning this test can be used) for the  duration of the COVID-19 declaration under Section 564(b)(1) of the Act, 21 U.S.C. section 360bbb-3(b)(1), unless the authorization is terminated or revoked.  Performed at [facility], [address] [place], [place] [address]   Resp Panel by RT-PCR (Flu A&B, Covid) Nasopharyngeal Swab     Status: Abnormal   Collection Time: 01/28/21 12:39 PM   Specimen: Nasopharyngeal Swab; Nasopharyngeal(NP) swabs in vial transport medium  Result Value Ref Range Status   SARS Coronavirus 2 by RT PCR POSITIVE (A) NEGATIVE Final    Comment: RESULT CALLED TO, READ BACK BY AND VERIFIED WITH: JESSICA COLTRANE AT 9233 01/28/21.PMF (NOTE) SARS-CoV-2 target nucleic acids are DETECTED.  The SARS-CoV-2 RNA is generally detectable in upper respiratory specimens during the acute phase of infection. Positive results are indicative of the presence of  the identified virus, but do not rule out bacterial infection or co-infection with other pathogens not detected by the test. Clinical correlation with patient history and other diagnostic information is necessary to determine patient infection status. The expected result is Negative.  Fact Sheet for Patients: EntrepreneurPulse.com.au  Fact Sheet for Healthcare Providers: IncredibleEmployment.be  This test is not yet approved or cleared by the [place] FDA and  has been authorized for detection and/or diagnosis of SARS-CoV-2 by FDA under an Emergency Use Authorization (EUA).  This EUA will remain in effect (meaning this test can  be used) for the duration of  the COVID-19 declaration under Section 564(b)(1) of the Act, 21 U.S.C. section 360bbb-3(b)(1), unless the authorization is terminated or revoked sooner.     Influenza A by PCR NEGATIVE NEGATIVE Final   Influenza B by PCR NEGATIVE NEGATIVE Final    Comment: (NOTE) The Xpert Xpress SARS-CoV-2/FLU/RSV plus assay is intended as an aid in the diagnosis of influenza from Nasopharyngeal swab specimens and should not be used as a sole basis for treatment. Nasal washings and aspirates are unacceptable for Xpert Xpress SARS-CoV-2/FLU/RSV testing.  Fact Sheet for Patients: EntrepreneurPulse.com.au  Fact Sheet for Healthcare Providers: IncredibleEmployment.be  This test is not yet approved or cleared by the [place] FDA and has been authorized for detection and/or diagnosis of SARS-CoV-2 by FDA under an Emergency Use Authorization (EUA). This EUA will remain in effect (meaning this test can be used) for the duration of the COVID-19 declaration under Section 564(b)(1) of the Act, 21 U.S.C. section 360bbb-3(b)(1), unless the authorization is terminated or revoked.  Performed at [facility], [address], [place], [place] [address]           Radiology Studies: DG Chest Portable 1 View  Result Date: 01/28/2021 CLINICAL DATA:  Cough. EXAM: PORTABLE CHEST 1 VIEW COMPARISON:  01/08/2021 FINDINGS: Heart size is normal. Patchy parenchymal opacities are identified in the LOWER lobes slightly improved from previous exam. There has been significant improvement in aeration of the UPPER lobes. IMPRESSION: Persistent bibasilar opacities. Electronically Signed   By: Nolon Nations M.D.   On: 01/28/2021 13:48        Scheduled Meds: . albuterol  2 puff Inhalation Q4H while awake  . apixaban  5 mg Oral BID  . furosemide  40 mg Oral Daily  . gabapentin  300 mg Oral TID  . insulin aspart      . insulin aspart  0-20 Units Subcutaneous TID WC  . insulin aspart  0-5 Units Subcutaneous QHS  . methylPREDNISolone (SOLU-MEDROL) injection  0.5 mg/kg Intravenous Q12H   Followed by  . [START ON 01/31/2021] predniSONE  50 mg  Oral Daily  . multivitamin with minerals  1 tablet Oral Daily  . nystatin  5 mL Oral QID  . polyethylene glycol  17 g Oral Daily  . potassium chloride SA  20 mEq Oral Daily  . pravastatin  10 mg Oral q1800  . traMADol  100 mg Oral TID   Continuous Infusions: . azithromycin Stopped (01/28/21 2307)  . cefTRIAXone (ROCEPHIN)  IV Stopped (01/28/21 2038)  . remdesivir 100 mg in NS 100 mL      Assessment & Plan:   Principal Problem:   Pneumonia due to COVID-19 virus Active Problems:   Hyperlipidemia   Fibromyalgia   Essential hypertension   Acute respiratory failure with hypoxia (HCC)   Pulmonary emboli (HCC)   Hypomagnesemia   Hypokalemia   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HCC)   Acute hypoxemic respiratory failure secondary to COVID-19 pneumonia Met sepsis criteria-secondary to COVID-19 in setting of facility experiencing a COVID-19 outbreak Covid Pna: 5/16- still becomes hypoxic on 3 L , required 5 L with OT Lactic acid elevated. BNP normal  will start gentle hydration Hold Lasix as BP on low side Continue IV  remdesivir Continue oxygen support Continue steroids IV Continue IV ceftriaxone and azithromycin Albuterol I-S and flutter valves   Hyperglycemia- checking A1c  BG elevated.  Likely due to infection and steroid Will start lantus 12 units bid Add novolog 3units tid with meals RISS   History of hypertension- Hold bp meds and dc lasix as bp on low side   Fibromyalgia/chronic pain-continue tramadol    Neuropathy- continue gabapentin  History of bilateral segmental and subsegmental pulmonary emboli Right peroneal DVT -on Eliquis -D-dimer on admission was 0.48, therefore no indication for repeat CTA at this time  Hyperlipidemia- continue pravastatin    TOC consulted: Per son, they lost her medications for three days, including not giving her eliquis for three days. They didn't change her sheets or clothing despite being soaked in urine. Per son, they told patient to " go ahead 'shit' on yourself and we'll change you later   DVT prophylaxis: Eliquis Code Status: Full Family Communication: Son updated  Status is: Inpatient  Remains inpatient appropriate because:Inpatient level of care appropriate due to severity of illness   Dispo: The patient is from: SNF              Anticipated d/c is to: SNF              Patient currently is not medically stable to d/c.   Difficult to place patient No   Anticipated discharge: To be determined.  Patient does not want to go back to [place].  We will also need to be quarantined for 10 days prior to going back to SNF         LOS: 1 day   Time spent: 45 min with >50% on coc    Nolberto Hanlon, MD [facility] Pager [phone]  If 7PM-7AM, please contact night-coverage 01/29/2021, 8:21 AM

## 2021-01-29 NOTE — Progress Notes (Signed)
Inpatient Diabetes Program Recommendations  AACE/ADA: New Consensus Statement on Inpatient Glycemic Control (2015)  Target Ranges:  Prepandial:   less than 140 mg/dL      Peak postprandial:   less than 180 mg/dL (1-2 hours)      Critically ill patients:  140 - 180 mg/dL   Lab Results  Component Value Date   GLUCAP 255 (H) 01/29/2021   HGBA1C 8.0 (H) 01/28/2021    Review of Glycemic Control Results for Elizabeth Rowland, Elizabeth Rowland (MRN [id number]) as of 01/29/2021 09:12  Ref. Range 01/28/2021 19:36 01/28/2021 22:15 01/28/2021 23:38 01/29/2021 08:08  Glucose-Capillary Latest Ref Range: 70 - 99 mg/dL 179 (HH) 150 (H) 569 (H) 255 (H)   Diabetes history: New Dx/ of DM-A1C=8% indicating average CBG's 180 mg/dL Outpatient Diabetes medications: none Current orders for Inpatient glycemic control:  Solumedrol 46.95 mg IV q 12 hours Novolog resistant tid with meals and HS Inpatient Diabetes Program Recommendations:    Please add Levemir 12 units bid and also please add Novolog meal coverage 3 units tid with meals.  Would also benefit from the addition of Tradjenta 5 mg dally due too recent Dx. Of Covid-19. Once steroids are tapered and at d/c patient will likely be able to be managed with oral agents only.   Thanks,  Beryl Meager, RN, BC-ADM Inpatient Diabetes Coordinator Pager [phone] (8a-5p)

## 2021-01-29 NOTE — Evaluation (Signed)
Occupational Therapy Evaluation Patient Details Name: Elizabeth Rowland MRN: [id number] DOB: 28-Mar-1941 Today's Date: 01/29/2021    History of Present Illness 80 y.o. female with medical history significant for fibromyalgia, hypertension, hyperlipidemia, currently on tramadol and gabapentin for chronic pain and fibromyalgia, history of right peritoneal DVT, bilateral upper lobe PE, presents to the emergency department on 01/26/2021 from facility due to sudden refusing patient to call back to the [facility]. Pt was recently admitted 4/23-5/9, discharged to SNF.   Clinical Impression   Pt was seen for OT evaluation this date. Prior to hospital admission, pt was recent at [facility] following recent admission. Prior to recent admission, pt reports independent with mobility and ADL. Pt lives with her son who assists with transportation, sometimes with meds, some meal prep, and laundry/chores sometimes. Pt appeared apprehensive to attempt bed mobility but with encouragement agrees to try. TOTAL A for donning socks, MIN A for sup>sit EOB. In doing so, SpO2 on 3L dropped from >95% to 84% on 3L seated EOB. Despite instruction in PLB, bumped up to 4L without improvement and then 5L able to get to 89-92%. Pt initially declining OOB trials but when PT entered session and additional encouragement provided, pt willing to try with 2 therapists. +2 assist for STS and once in standing able to tolerate with PT assist only while OT provided TOTAL A for pericare. Pt unaware of BM. SpO2 down to 87% with standing >61min on 5L, back up to >95% on 3L at end of session in bed. Currently pt demonstrates impairments as described below (See OT problem list) which functionally limit her ability to perform ADL/self-care tasks. Pt currently requires +2 for standing attempts, MAX A - TOTAL A for LB ADL, and increased O2 needs with exertion. Pt would benefit from skilled OT services to address noted impairments and functional  limitations (see below for any additional details) in order to maximize safety and independence while minimizing falls risk and caregiver burden. Upon hospital discharge, recommend STR to maximize pt safety and return to PLOF.     Follow Up Recommendations  SNF    Equipment Recommendations       Recommendations for Other Services       Precautions / Restrictions Precautions Precautions: Fall Precaution Comments: keep Sp02 above 85%      Mobility Bed Mobility Overal bed mobility: Needs Assistance Bed Mobility: Supine to Sit;Sit to Supine;Rolling Rolling: Supervision   Supine to sit: Min assist;HOB elevated Sit to supine: Min assist        Transfers Overall transfer level: Needs assistance Equipment used: Rolling walker (2 wheeled) Transfers: Sit to/from Stand Sit to Stand: Min assist;Mod assist;From elevated surface;+2 physical assistance              Balance Overall balance assessment: Needs assistance Sitting-balance support: Single extremity supported Sitting balance-Leahy Scale: Fair     Standing balance support: Bilateral upper extremity supported Standing balance-Leahy Scale: Poor                             ADL either performed or assessed with clinical judgement   ADL                                         General ADL Comments: TOTAL A for donning socks, TOTAL A for pericare (unaware she was having BM) in  standing with PT assist for trnasfer and standing balance, OT for pericare     Vision Baseline Vision/History: Wears glasses Wears Glasses: At all times Patient Visual Report: No change from baseline       Perception     Praxis      Pertinent Vitals/Pain Pain Assessment: No/denies pain     Hand Dominance Right   Extremity/Trunk Assessment Upper Extremity Assessment Upper Extremity Assessment: Generalized weakness   Lower Extremity Assessment Lower Extremity Assessment: Generalized weakness (bandaged  wounds on ankles/heels)       Communication Communication Communication: No difficulties   Cognition Arousal/Alertness: Awake/alert Behavior During Therapy: Flat affect;Anxious Overall Cognitive Status: No family/caregiver present to determine baseline cognitive functioning                                 General Comments: Pt anxious, fearful with standing attempts, unaware she was having a BM   General Comments       Exercises Other Exercises Other Exercises: Pt tolerated sitting EOB, initially >90% on 3L, down to 84% on 3L seated EOB despite PLB, bumped up to 4L without improvement and then 5L able to get to 89-92%, down to 87% with standing >81min on 5L, back up to >95% on 3L at end of session in bed   Shoulder Instructions      Home Living Family/patient expects to be discharged to:: Private residence Living Arrangements: Children (son Elizabeth Rowland) Available Help at Discharge: Family Type of Home: House Home Access: Level entry     Home Layout: One level     Bathroom Shower/Tub: Chief Strategy Officer: Standard     Home Equipment: Cane - single point   Additional Comments: per pt report last week she had a 2WW, now denies having one      Prior Functioning/Environment Level of Independence: Needs assistance  Gait / Transfers Assistance Needed: Pt reports being indep with mobility at home ADL's / Homemaking Assistance Needed: Son cooks some, laundry, trash; pt indep with showering, dressing, toileting, some of the cooking; son helps "sometimes" with meds, son provides transportation   Comments: Pt denies falls        OT Problem List: Decreased strength;Cardiopulmonary status limiting activity;Decreased cognition;Decreased safety awareness;Decreased activity tolerance;Impaired balance (sitting and/or standing);Decreased knowledge of use of DME or AE;Obesity      OT Treatment/Interventions: Self-care/ADL training;DME and/or AE  instruction;Therapeutic activities;Balance training;Therapeutic exercise;Neuromuscular education;Visual/perceptual remediation/compensation;Energy conservation;Patient/family education    OT Goals(Current goals can be found in the care plan section) Acute Rehab OT Goals Patient Stated Goal: to go home OT Goal Formulation: With patient Time For Goal Achievement: 02/12/21 Potential to Achieve Goals: Good  OT Frequency: Min 2X/week   Barriers to D/C:            Co-evaluation PT/OT/SLP Co-Evaluation/Treatment: Yes Reason for Co-Treatment: To address functional/ADL transfers;For patient/therapist safety PT goals addressed during session: Mobility/safety with mobility;Balance;Proper use of DME OT goals addressed during session: ADL's and self-care;Proper use of Adaptive equipment and DME      AM-PAC OT "6 Clicks" Daily Activity     Outcome Measure Help from another person eating meals?: None Help from another person taking care of personal grooming?: A Little Help from another person toileting, which includes using toliet, bedpan, or urinal?: Total Help from another person bathing (including washing, rinsing, drying)?: A Lot Help from another person to put on and taking off regular upper body  clothing?: A Little Help from another person to put on and taking off regular lower body clothing?: Total 6 Click Score: 14   End of Session Equipment Utilized During Treatment: Rolling walker;Gait belt;Oxygen  Activity Tolerance: Patient tolerated treatment well Patient left: in bed;with call bell/phone within reach;with bed alarm set (chair position)  OT Visit Diagnosis: Unsteadiness on feet (R26.81);Muscle weakness (generalized) (M62.81)                Time: 5615-3794 OT Time Calculation (min): 51 min Charges:  OT General Charges $OT Visit: 1 Visit OT Evaluation $OT Eval High Complexity: 1 High OT Treatments $Self Care/Home Management : 23-37 mins  Wynona Canes, MPH, MS, OTR/L ascom  [phone] 01/29/21, 1:49 PM

## 2021-01-29 NOTE — TOC Initial Note (Signed)
Transition of Care [facility]) - Initial/Assessment Note    Patient Details  Name: Elizabeth Rowland MRN: [id number] Date of Birth: 10/12/1940  Transition of Care [facility]) CM/SW Contact:    Chapman Fitch, RN Phone Number: 01/29/2021, 2:39 PM  Clinical Narrative:                  Patient currently being "cleaned up" Assessment completed via phone with son [name]  Last week patient was discharge from the hospital to [facility] Prior to that she lives at home with son.  States that she was pretty independent, and only has a cane in the home  PT has assessed patient and recommends SNF  I confirmed with Kenney Houseman at [facility] that patient can return to facility even though patient is covid positive  Son is very dissatisfied with care patient received at [facility].  He states that there is no way that his mom will be returning to [facility]  Son would like me to do another bedsearch. I notified him that it is not likely that I will receive any bed offers as patient is now covid positive.  If no bed offers received then patient would have to return to [facility] or home with home health services.  Son states if that is the case he will take her home     Expected Discharge Plan: Home w Home Health Services Barriers to Discharge: Continued Medical Work up   Patient Goals and CMS Choice        Expected Discharge Plan and Services Expected Discharge Plan: Home w Home Health Services                                              Prior Living Arrangements/Services     Patient language and need for interpreter reviewed:: Yes Do you feel safe going back to the place where you live?: Yes      Need for Family Participation in Patient Care: Yes (Comment) Care giver support system in place?: Yes (comment)   Criminal Activity/Legal Involvement Pertinent to Current Situation/Hospitalization: No -  Comment as needed  Activities of Daily Living Home Assistive Devices/Equipment: Dan Humphreys (specify type) ADL Screening (condition at time of admission) Patient's cognitive ability adequate to safely complete daily activities?: Yes Is the patient deaf or have difficulty hearing?: Yes Does the patient have difficulty seeing, even when wearing glasses/contacts?: No Does the patient have difficulty concentrating, remembering, or making decisions?: Yes Patient able to express need for assistance with ADLs?: Yes Does the patient have difficulty dressing or bathing?: Yes Independently performs ADLs?: Yes (appropriate for developmental age) Does the patient have difficulty walking or climbing stairs?: Yes Weakness of Legs: Both Weakness of Arms/Hands: Both  Permission Sought/Granted                  Emotional Assessment              Admission diagnosis:  Hypokalemia [E87.6] Hypomagnesemia [E83.42] Hyperglycemia [R73.9] Acute on chronic respiratory failure with hypoxia (HCC) [J96.21] Encounter for medical screening examination [Z13.9] COVID [U07.1] Pneumonia due to COVID-19 virus [U07.1, J12.82] Patient Active Problem List   Diagnosis Date Noted  . Pneumonia due to COVID-19 virus 01/28/2021  . Hypomagnesemia 01/28/2021  . Hypokalemia 01/28/2021  . 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HCC)  01/28/2021  . Community acquired pneumonia   . Lung mass   . Acute respiratory failure with hypoxia (HCC) 01/07/2021  . Acute respiratory distress syndrome (ARDS) due to COVID-19 virus (HCC) 01/07/2021  . Pulmonary emboli (HCC) 01/07/2021  . Severe sepsis with acute organ dysfunction (HCC) 01/06/2021  . Hyperlipidemia 01/06/2021  . Fibromyalgia 01/06/2021  . Essential hypertension 01/06/2021   PCP:  Leanna Sato, MD Pharmacy:   [facility] [id number] [place], [place] - [address] AT [facility] [address] [place] [address] Phone: [phone] Fax:  [phone]     Social Determinants of Health (SDOH) Interventions    Readmission Risk Interventions No flowsheet data found.

## 2021-01-29 NOTE — Evaluation (Signed)
Physical Therapy Evaluation Patient Details Name: Elizabeth Rowland MRN: [id number] DOB: [birth year] Today's Date: 01/29/2021   History of Present Illness  presented to ER from [facility], unhappy with current facility and requesting a different facility; while in ER, noted, with onset of SOB, increased O2 demands.  Admitted for management of acute hypoxic respiratory failure due to COVID-19 PNA.  Of note, patient recently hospitalized (4/23-01/22/21) due to acute PE  Clinical Impression  Patient with OT upon arrival to room; agreeable to participation with mobility assessment with mod encouragement from therapists.  Alert and oriented to basic information; follows commands, but generally fearful and anxious of progressive activities.  Generally weak and deconditioned throughout all extremities; no focal weakness appreciated.  Currently requiring cga/min assist for rolling; min/mod assist for supine/sit; close sup for sitting balance; min/mod assist +2 for sit/stand and standing balance with RW (and elevated bed surface).  Pulls on RW despite cuing, but able to generate lift off with limited physical assist from therapist; clears buttocks from seating surface, but does maintan flexion at bilat hips/trunk.  Generally tremulous throughout UE/LEs with standing efforts due to fear/anxiety with progressive mobility. Maintains x60-90 seconds prior to need for seated rest break.  Unable to tolerate progression towards gait/stepping this date. Patient maintained on 5L of O2 with exertional activity during session; desat to 88-89% on 5L, recovering >90% within 30-45 seconds of seated rest.  Returned to 3L end of session with resting sats 97%. Would benefit from skilled PT to address above deficits and promote optimal return to PLOF.; recommend transition to [facility] upon discharge from acute hospitalization.     Follow Up Recommendations SNF    Equipment Recommendations       Recommendations for Other Services        Precautions / Restrictions Precautions Precautions: Fall Precaution Comments: keep Sp02 above 85% Restrictions Weight Bearing Restrictions: No      Mobility  Bed Mobility Overal bed mobility: Needs Assistance Bed Mobility: Supine to Sit;Sit to Supine;Rolling Rolling: Min assist;Min guard   Supine to sit: Min assist Sit to supine: Mod assist;Min assist   General bed mobility comments: heavy use of bedrails and elevated HOB with supine to sit; mod assist to elevate LEs onto bed with sit to supine    Transfers Overall transfer level: Needs assistance Equipment used: Rolling walker (2 wheeled) Transfers: Sit to/from Stand Sit to Stand: Min assist;Mod assist;+2 physical assistance;From elevated surface         General transfer comment: pulls on RW despite cuing, but able to generate lift off with limited physical assist from therapist; clears buttocks from seating surface, but does maintan flexion at bilat hips/trunk.  Generally tremulous throughout UE/LEs with standing efforts due to fear/anxiety with progressive mobility. Maintains x60-90 seconds prior to need for seated rest break.  Ambulation/Gait             General Gait Details: unsafe/unable  Stairs            Wheelchair Mobility    Modified Rankin (Stroke Patients Only)       Balance Overall balance assessment: Needs assistance Sitting-balance support: No upper extremity supported;Feet supported Sitting balance-Leahy Scale: Good Sitting balance - Comments: maintains static sitting with fair/good control; limited ability to move outside immediate BOS   Standing balance support: Bilateral upper extremity supported Standing balance-Leahy Scale: Poor Standing balance comment: very tremulous and unsteady; poor balance reactions, poor standing tolerance  Pertinent Vitals/Pain Pain Assessment: No/denies pain    Home Living Family/patient expects to be discharged  to:: Private residence Living Arrangements: Children Available Help at Discharge: Family Type of Home: House Home Access: Level entry     Home Layout: One level Home Equipment: Cane - single point Additional Comments: per pt report last week she had a 2WW, now denies having one    Prior Function Level of Independence: Needs assistance   Gait / Transfers Assistance Needed: Pt reports being indep with mobility at home  ADL's / Homemaking Assistance Needed: Son cooks some, laundry, trash; pt indep with showering, dressing, toileting, some of the cooking; son helps "sometimes" with meds, son provides transportation  Comments: Mod indep with ADLs, household mobilization without assist device; denies fall history within previous six months     Hand Dominance   Dominant Hand: Right    Extremity/Trunk Assessment   Upper Extremity Assessment Upper Extremity Assessment: Generalized weakness    Lower Extremity Assessment Lower Extremity Assessment: Generalized weakness (grossly 4-/5 throughout)       Communication   Communication: No difficulties  Cognition Arousal/Alertness: Awake/alert Behavior During Therapy: Anxious Overall Cognitive Status: No family/caregiver present to determine baseline cognitive functioning                                 General Comments: Alert and oriented to basic information; follows simple commands, but does require encouragement for participation due to increased anxiety      General Comments      Exercises Other Exercises Other Exercises: Rolling bilat, cga/min assist, for peri-care/hygiene after bowel incontinence; dep of second person for hygiene Other Exercises: Positioned in chair position in bed for pulmonary hygiene, repositioning; tolerating position well, needs in reach. Other Exercises: Pt tolerated sitting EOB, initially >90% on 3L, down to 84% on 3L seated EOB despite PLB, bumped up to 4L without improvement and then  5L able to get to 89-92%, down to 87% with standing >71min on 5L, back up to >95% on 3L at end of session in bed   Assessment/Plan    PT Assessment Patient needs continued PT services  PT Problem List Decreased strength;Decreased range of motion;Decreased activity tolerance;Decreased balance;Decreased mobility;Cardiopulmonary status limiting activity;Decreased knowledge of use of DME;Decreased safety awareness;Decreased knowledge of precautions;Decreased skin integrity       PT Treatment Interventions DME instruction;Gait training;Functional mobility training;Therapeutic activities;Therapeutic exercise;Neuromuscular re-education;Balance training;Patient/family education    PT Goals (Current goals can be found in the Care Plan section)  Acute Rehab PT Goals Patient Stated Goal: to go home PT Goal Formulation: With patient Time For Goal Achievement: 02/12/21 Potential to Achieve Goals: Good    Frequency Min 2X/week   Barriers to discharge        Co-evaluation   Reason for Co-Treatment: Complexity of the patient's impairments (multi-system involvement) PT goals addressed during session: Mobility/safety with mobility OT goals addressed during session: ADL's and self-care       AM-PAC PT "6 Clicks" Mobility  Outcome Measure Help needed turning from your back to your side while in a flat bed without using bedrails?: A Little Help needed moving from lying on your back to sitting on the side of a flat bed without using bedrails?: A Lot Help needed moving to and from a bed to a chair (including a wheelchair)?: A Lot Help needed standing up from a chair using your arms (e.g., wheelchair or bedside chair)?: A  Lot Help needed to walk in hospital room?: Total Help needed climbing 3-5 steps with a railing? : Total 6 Click Score: 11    End of Session Equipment Utilized During Treatment: Oxygen Activity Tolerance: Patient limited by fatigue Patient left: in bed;with call bell/phone within  reach;with bed alarm set Nurse Communication: Mobility status PT Visit Diagnosis: Muscle weakness (generalized) (M62.81);Unsteadiness on feet (R26.81);Difficulty in walking, not elsewhere classified (R26.2)    Time: 8338-2505 PT Time Calculation (min) (ACUTE ONLY): 19 min   Charges:   PT Evaluation $PT Eval Moderate Complexity: 1 Mod          Napolean Sia H. Manson Passey, PT, DPT, NCS 01/29/21, 2:24 PM [phone]

## 2021-01-30 LAB — COMPREHENSIVE METABOLIC PANEL
ALT: 66 U/L — ABNORMAL HIGH (ref 0–44)
AST: 46 U/L — ABNORMAL HIGH (ref 15–41)
Albumin: 2.6 g/dL — ABNORMAL LOW (ref 3.5–5.0)
Alkaline Phosphatase: 51 U/L (ref 38–126)
Anion gap: 10 (ref 5–15)
BUN: 19 mg/dL (ref 8–23)
CO2: 29 mmol/L (ref 22–32)
Calcium: 8 mg/dL — ABNORMAL LOW (ref 8.9–10.3)
Chloride: 97 mmol/L — ABNORMAL LOW (ref 98–111)
Creatinine, Ser: 0.75 mg/dL (ref 0.44–1.00)
GFR, Estimated: >60 mL/min (ref >60)
Glucose, Bld: 218 mg/dL — ABNORMAL HIGH (ref 70–99)
Potassium: 3.5 mmol/L (ref 3.5–5.1)
Sodium: 136 mmol/L (ref 135–145)
Total Bilirubin: 0.6 mg/dL (ref 0.3–1.2)
Total Protein: 5.4 g/dL — ABNORMAL LOW (ref 6.5–8.1)

## 2021-01-30 LAB — CBC WITH DIFFERENTIAL/PLATELET
Abs Immature Granulocytes: 1.24 10*3/uL — ABNORMAL HIGH (ref 0.00–0.07)
Basophils Absolute: 0.1 10*3/uL (ref 0.0–0.1)
Basophils Relative: 0 %
Eosinophils Absolute: 0 10*3/uL (ref 0.0–0.5)
Eosinophils Relative: 0 %
HCT: 41.1 % (ref 36.0–46.0)
Hemoglobin: 13.6 g/dL (ref 12.0–15.0)
Immature Granulocytes: 6 %
Lymphocytes Relative: 7 %
Lymphs Abs: 1.6 10*3/uL (ref 0.7–4.0)
MCH: 29.9 pg (ref 26.0–34.0)
MCHC: 33.1 g/dL (ref 30.0–36.0)
MCV: 90.3 fL (ref 80.0–100.0)
Monocytes Absolute: 2.7 10*3/uL — ABNORMAL HIGH (ref 0.1–1.0)
Monocytes Relative: 12 %
Neutro Abs: 16.7 10*3/uL — ABNORMAL HIGH (ref 1.7–7.7)
Neutrophils Relative %: 75 %
Platelets: 88 10*3/uL — ABNORMAL LOW (ref 150–400)
RBC: 4.55 MIL/uL (ref 3.87–5.11)
RDW: 15 % (ref 11.5–15.5)
Smear Review: NORMAL
WBC: 22.3 10*3/uL — ABNORMAL HIGH (ref 4.0–10.5)
nRBC: 0 % (ref 0.0–0.2)

## 2021-01-30 LAB — D-DIMER, QUANTITATIVE: D-Dimer, Quant: 0.39 ug/mL-FEU (ref 0.00–0.50)

## 2021-01-30 LAB — LACTIC ACID, PLASMA
Lactic Acid, Venous: 1.4 mmol/L (ref 0.5–1.9)
Lactic Acid, Venous: 3.5 mmol/L (ref 0.5–1.9)
Lactic Acid, Venous: 5.3 mmol/L (ref 0.5–1.9)
Lactic Acid, Venous: 3.4 mmol/L (ref 0.5–1.9)

## 2021-01-30 LAB — GLUCOSE, CAPILLARY
Glucose-Capillary: 195 mg/dL — ABNORMAL HIGH (ref 70–99)
Glucose-Capillary: 401 mg/dL — ABNORMAL HIGH (ref 70–99)
Glucose-Capillary: 413 mg/dL — ABNORMAL HIGH (ref 70–99)
Glucose-Capillary: 381 mg/dL — ABNORMAL HIGH (ref 70–99)
Glucose-Capillary: 151 mg/dL — ABNORMAL HIGH (ref 70–99)

## 2021-01-30 LAB — PROCALCITONIN: Procalcitonin: <0.1 ng/mL

## 2021-01-30 LAB — C-REACTIVE PROTEIN: CRP: 6.9 mg/dL — ABNORMAL HIGH (ref <1.0)

## 2021-01-30 MED ORDER — INSULIN GLARGINE 100 UNIT/ML ~~LOC~~ SOLN
17.0000 [IU] | Freq: Two times a day (BID) | SUBCUTANEOUS | Status: DC
  Administered 2021-01-30 – 2021-02-01 (×4): 17 [IU] via SUBCUTANEOUS
  Filled 2021-01-30 (×6): qty 0.17

## 2021-01-30 NOTE — Progress Notes (Addendum)
PROGRESS NOTE    Elizabeth Rowland  HWK:[id number] DOB: [birth year] DOA: 01/26/2021 PCP: Marguerita Merles, MD    Brief Narrative:  Elizabeth Rowland is a 80 y.o. female with medical history significant for fibromyalgia, hypertension, hyperlipidemia, currently on tramadol and gabapentin for chronic pain and fibromyalgia, currently not taking any more anti-hypertensives and hyperlipidemia, history of right peritoneal DVT, bilateral upper lobe PE, presents to the emergency department on 01/26/2021 from facility due to sudden refusing patient to call back to the [facility].  Per EHR notes, son reports significant frustration with [facility] due to reported poor treatment of his mother.  He reports that they have not been appropriately caring for patient.  Patient was holding in the emergency department for several days pending new facility.  On day of hospitalization, patient was noted to have hypoxia and requiring increased nasal cannula use.  EDP ordered repeat COVID test and it was positive. She reports baseline right-sided rib pain that has been present for many years    5/16- with PT pt dropped from 95% to 84% on 3L , oxygen was bumped off with 4 L without improvement, and then bumped up to 5 L and able to sustain 89 to 92%  5/17-BG elevated in 400's, given lantus and novolog. Lactic acid up 3.5 Patient sitting in bed without any acute distress.  She denies chest pain, abdominal pain , some shortness of breath.  Afebrile.   Consultants:     Procedures:   Antimicrobials:   Remdesivir    Subjective:  as above   objective: Vitals:   01/29/21 1555 01/29/21 2118 01/30/21 0357 01/30/21 0728  BP: 136/63 127/63 132/73 131/73  Pulse: 100 100 79 76  Resp: _0 Temp: 98.2 F (36.8 C) 97.6 F (36.4 C) 98.6 F (37 C) 97.8 F (36.6 C)  TempSrc: Oral Oral    SpO2: 92% 95% 96% 96%  Weight:      Height:        Intake/Output Summary (Last 24 hours)  at 01/30/2021 9458 Last data filed at 01/30/2021 5929 Gross per 24 hour  Intake 1581.92 ml  Output 1050 ml  Net 531.92 ml   Filed Weights   01/26/21 1713  Weight: 93.9 kg    Examination: Nad, quiet, answers appropriately +crackles , no wheezing  RRR, S1-S2 Soft benign positive bowel sounds No edema Awake and alert and oriented x3 Mood and affect appropriate in current setting    Data Reviewed: I have personally reviewed following labs and imaging studies  CBC: Recent Labs  Lab 01/28/21 1257 01/29/21 0435 01/30/21 0432  WBC 17.4* 14.8* 22.3*  NEUTROABS 9.8* 9.6* 16.7*  HGB 15.5* 14.4 13.6  HCT 49.2* 44.8 41.1  MCV 93.2 90.7 90.3  PLT 102* 71* 88*   Basic Metabolic Panel: Recent Labs  Lab 01/28/21 1257 01/28/21 1956 01/29/21 0435 01/30/21 0432  NA 135  --  138 136  K 3.4*  --  3.7 3.5  CL 90*  --  96* 97*  CO2 32  --  31 29  GLUCOSE 298* 396* 187* 218*  BUN 16  --  15 19  CREATININE 0.83  --  0.70 0.75  CALCIUM 8.7*  --  8.2* 8.0*  MG 1.5*  --  2.0  --   PHOS  --   --  3.6  --    GFR: Estimated Creatinine Clearance: 60.1 mL/min (by C-G formula based on SCr of 0.75 mg/dL). Liver Function  Tests: Recent Labs  Lab 01/29/21 0435 01/30/21 0432  AST 84* 46*  ALT 94* 66*  ALKPHOS 54 51  BILITOT 0.7 0.6  PROT 5.7* 5.4*  ALBUMIN 2.8* 2.6*   No results for input(s): LIPASE, AMYLASE in the last 168 hours. No results for input(s): AMMONIA in the last 168 hours. Coagulation Profile: No results for input(s): INR, PROTIME in the last 168 hours. Cardiac Enzymes: No results for input(s): CKTOTAL, CKMB, CKMBINDEX, TROPONINI in the last 168 hours. BNP (last 3 results) No results for input(s): PROBNP in the last 8760 hours. HbA1C: Recent Labs    01/28/21 2010  HGBA1C 8.0*   CBG: Recent Labs  Lab 01/28/21 2338 01/29/21 0808 01/29/21 1224 01/29/21 1556 01/29/21 2116  GLUCAP 328* 255* 280* 201* 288*   Lipid Profile: No results for input(s): CHOL,  HDL, LDLCALC, TRIG, CHOLHDL, LDLDIRECT in the last 72 hours. Thyroid Function Tests: No results for input(s): TSH, T4TOTAL, FREET4, T3FREE, THYROIDAB in the last 72 hours. Anemia Panel: No results for input(s): VITAMINB12, FOLATE, FERRITIN, TIBC, IRON, RETICCTPCT in the last 72 hours. Sepsis Labs: Recent Labs  Lab 01/28/21 1257 01/28/21 1457 01/29/21 0435 01/29/21 0832 01/29/21 1105 01/29/21 1449 01/29/21 1710 01/30/21 0432  PROCALCITON 0.12  --  0.14  --   --   --   --  <0.10  LATICACIDVEN  --    < >  --  1.3 6.1* 4.4* 3.2*  --    < > = values in this interval not displayed.    Recent Results (from the past 240 hour(s))  Resp Panel by RT-PCR (Flu A&B, Covid) Nasopharyngeal Swab     Status: None   Collection Time: 01/22/21 11:43 AM   Specimen: Nasopharyngeal Swab; Nasopharyngeal(NP) swabs in vial transport medium  Result Value Ref Range Status   SARS Coronavirus 2 by RT PCR NEGATIVE NEGATIVE Final    Comment: (NOTE) SARS-CoV-2 target nucleic acids are NOT DETECTED.  The SARS-CoV-2 RNA is generally detectable in upper respiratory specimens during the acute phase of infection. The lowest concentration of SARS-CoV-2 viral copies this assay can detect is 138 copies/mL. A negative result does not preclude SARS-Cov-2 infection and should not be used as the sole basis for treatment or other patient management decisions. A negative result may occur with  improper specimen collection/handling, submission of specimen other than nasopharyngeal swab, presence of viral mutation(s) within the areas targeted by this assay, and inadequate number of viral copies(<138 copies/mL). A negative result must be combined with clinical observations, patient history, and epidemiological information. The expected result is Negative.  Fact Sheet for Patients:  EntrepreneurPulse.com.au  Fact Sheet for Healthcare Providers:  IncredibleEmployment.be  This test is  no t yet approved or cleared by the [place] FDA and  has been authorized for detection and/or diagnosis of SARS-CoV-2 by FDA under an Emergency Use Authorization (EUA). This EUA will remain  in effect (meaning this test can be used) for the duration of the COVID-19 declaration under Section 564(b)(1) of the Act, 21 U.S.C.section 360bbb-3(b)(1), unless the authorization is terminated  or revoked sooner.       Influenza A by PCR NEGATIVE NEGATIVE Final   Influenza B by PCR NEGATIVE NEGATIVE Final    Comment: (NOTE) The Xpert Xpress SARS-CoV-2/FLU/RSV plus assay is intended as an aid in the diagnosis of influenza from Nasopharyngeal swab specimens and should not be used as a sole basis for treatment. Nasal washings and aspirates are unacceptable for Xpert Xpress SARS-CoV-2/FLU/RSV testing.  Fact  Sheet for Patients: EntrepreneurPulse.com.au  Fact Sheet for Healthcare Providers: IncredibleEmployment.be  This test is not yet approved or cleared by the [place] FDA and has been authorized for detection and/or diagnosis of SARS-CoV-2 by FDA under an Emergency Use Authorization (EUA). This EUA will remain in effect (meaning this test can be used) for the duration of the COVID-19 declaration under Section 564(b)(1) of the Act, 21 U.S.C. section 360bbb-3(b)(1), unless the authorization is terminated or revoked.  Performed at [facility], [address] [place], [place] [address]   Resp Panel by RT-PCR (Flu A&B, Covid) Nasopharyngeal Swab     Status: Abnormal   Collection Time: 01/28/21 12:39 PM   Specimen: Nasopharyngeal Swab; Nasopharyngeal(NP) swabs in vial transport medium  Result Value Ref Range Status   SARS Coronavirus 2 by RT PCR POSITIVE (A) NEGATIVE Final    Comment: RESULT CALLED TO, READ BACK BY AND VERIFIED WITH: JESSICA COLTRANE AT 9417 01/28/21.PMF (NOTE) SARS-CoV-2 target nucleic acids are DETECTED.  The  SARS-CoV-2 RNA is generally detectable in upper respiratory specimens during the acute phase of infection. Positive results are indicative of the presence of the identified virus, but do not rule out bacterial infection or co-infection with other pathogens not detected by the test. Clinical correlation with patient history and other diagnostic information is necessary to determine patient infection status. The expected result is Negative.  Fact Sheet for Patients: EntrepreneurPulse.com.au  Fact Sheet for Healthcare Providers: IncredibleEmployment.be  This test is not yet approved or cleared by the [place] FDA and  has been authorized for detection and/or diagnosis of SARS-CoV-2 by FDA under an Emergency Use Authorization (EUA).  This EUA will remain in effect (meaning this test can  be used) for the duration of  the COVID-19 declaration under Section 564(b)(1) of the Act, 21 U.S.C. section 360bbb-3(b)(1), unless the authorization is terminated or revoked sooner.     Influenza A by PCR NEGATIVE NEGATIVE Final   Influenza B by PCR NEGATIVE NEGATIVE Final    Comment: (NOTE) The Xpert Xpress SARS-CoV-2/FLU/RSV plus assay is intended as an aid in the diagnosis of influenza from Nasopharyngeal swab specimens and should not be used as a sole basis for treatment. Nasal washings and aspirates are unacceptable for Xpert Xpress SARS-CoV-2/FLU/RSV testing.  Fact Sheet for Patients: EntrepreneurPulse.com.au  Fact Sheet for Healthcare Providers: IncredibleEmployment.be  This test is not yet approved or cleared by the [place] FDA and has been authorized for detection and/or diagnosis of SARS-CoV-2 by FDA under an Emergency Use Authorization (EUA). This EUA will remain in effect (meaning this test can be used) for the duration of the COVID-19 declaration under Section 564(b)(1) of the Act, 21 U.S.C. section  360bbb-3(b)(1), unless the authorization is terminated or revoked.  Performed at [facility], [address] [place], [place] [address]          Radiology Studies: DG Chest Portable 1 View  Result Date: 01/28/2021 CLINICAL DATA:  Cough. EXAM: PORTABLE CHEST 1 VIEW COMPARISON:  01/08/2021 FINDINGS: Heart size is normal. Patchy parenchymal opacities are identified in the LOWER lobes slightly improved from previous exam. There has been significant improvement in aeration of the UPPER lobes. IMPRESSION: Persistent bibasilar opacities. Electronically Signed   By: Nolon Nations M.D.   On: 01/28/2021 13:48        Scheduled Meds: . albuterol  2 puff Inhalation Q4H while awake  . apixaban  5 mg Oral BID  . gabapentin  300 mg Oral TID  .  insulin aspart  0-20 Units Subcutaneous TID WC  . insulin aspart  0-5 Units Subcutaneous QHS  . insulin aspart  3 Units Subcutaneous TID WC  . insulin glargine  12 Units Subcutaneous BID  . methylPREDNISolone (SOLU-MEDROL) injection  0.5 mg/kg Intravenous Q12H   Followed by  . [START ON 01/31/2021] predniSONE  50 mg Oral Daily  . multivitamin with minerals  1 tablet Oral Daily  . nystatin  5 mL Oral QID  . polyethylene glycol  17 g Oral Daily  . potassium chloride SA  20 mEq Oral Daily  . pravastatin  10 mg Oral q1800  . traMADol  100 mg Oral TID   Continuous Infusions: . sodium chloride 75 mL/hr at 01/30/21 0513  . azithromycin Stopped (01/29/21 2224)  . cefTRIAXone (ROCEPHIN)  IV Stopped (01/29/21 2050)  . remdesivir 100 mg in NS 100 mL 100 mg (01/29/21 0932)    Assessment & Plan:   Principal Problem:   Pneumonia due to COVID-19 virus Active Problems:   Hyperlipidemia   Fibromyalgia   Essential hypertension   Acute respiratory failure with hypoxia (HCC)   Pulmonary emboli (HCC)   Hypomagnesemia   Hypokalemia   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HCC)   Acute hypoxemic respiratory failure secondary to COVID-19 pneumonia Met sepsis  criteria-secondary to COVID-19 in setting of facility experiencing a COVID-19 outbreak Covid Pna: 5/16- still becomes hypoxic on 3 L , required 5 L with OT 5/17-Still requiring 02 Lactic acid elevated BNP normal Continue IV gentle hydration Continue IV remdesivir Continue IV steroids with  tapering DC IV antibiotics as Albuterol, I-S, flutter valves PT OT Hold statin since on Remdesivir     Hyperglycemia-  A1c 8.0 BG elevated due to infection and steroids We will increase Lantus to 17 units twice daily Continue NovoLog 3 units 3 times daily with meals R-ISS   History of hypertension- Hold BP meds and Lasix since blood pressure is normotensive   Fibromyalgia/chronic pain- continue tramadol   Neuropathy- continue gabapentin  History of bilateral segmental and subsegmental pulmonary emboli Right peroneal DVT -on Eliquis -D-dimer on admission was 0.48, therefore no indication for repeat CTA at this time  Hyperlipidemia- hold pravastatin  As above   TOC consulted: Per son, they lost her medications for three days, including not giving her eliquis for three days. They didn't change her sheets or clothing despite being soaked in urine. Per son, they told patient to " go ahead 'shit' on yourself and we'll change you later   DVT prophylaxis: Eliquis Code Status: Full Family Communication: Son updated  Status is: Inpatient  Remains inpatient appropriate because:Inpatient level of care appropriate due to severity of illness   Dispo: The patient is from: SNF              Anticipated d/c is to: SNF              Patient currently is not medically stable to d/c.   Difficult to place patient No   Anticipated discharge: To be determined.  Patient does not want to go back to [facility].  If she refuses, will need to go home. For now still hypoxic We will also need to be quarantined for 10 days prior to going back to SNF         LOS: 2 days   Time  spent: 35 min with >50% on coc    Nolberto Hanlon, MD [facility] Pager [phone]  If 7PM-7AM, please contact night-coverage 01/30/2021, 8:08  AM  

## 2021-01-31 LAB — COMPREHENSIVE METABOLIC PANEL
ALT: 54 U/L — ABNORMAL HIGH (ref 0–44)
AST: 30 U/L (ref 15–41)
Albumin: 2.7 g/dL — ABNORMAL LOW (ref 3.5–5.0)
Alkaline Phosphatase: 55 U/L (ref 38–126)
Anion gap: 7 (ref 5–15)
BUN: 22 mg/dL (ref 8–23)
CO2: 31 mmol/L (ref 22–32)
Calcium: 8.3 mg/dL — ABNORMAL LOW (ref 8.9–10.3)
Chloride: 101 mmol/L (ref 98–111)
Creatinine, Ser: 0.67 mg/dL (ref 0.44–1.00)
GFR, Estimated: >60 mL/min (ref >60)
Glucose, Bld: 180 mg/dL — ABNORMAL HIGH (ref 70–99)
Potassium: 4.1 mmol/L (ref 3.5–5.1)
Sodium: 139 mmol/L (ref 135–145)
Total Bilirubin: 0.6 mg/dL (ref 0.3–1.2)
Total Protein: 5.5 g/dL — ABNORMAL LOW (ref 6.5–8.1)

## 2021-01-31 LAB — CBC WITH DIFFERENTIAL/PLATELET
Abs Immature Granulocytes: 1 10*3/uL — ABNORMAL HIGH (ref 0.00–0.07)
Basophils Absolute: 0.1 10*3/uL (ref 0.0–0.1)
Basophils Relative: 0 %
Eosinophils Absolute: 0 10*3/uL (ref 0.0–0.5)
Eosinophils Relative: 0 %
HCT: 43.4 % (ref 36.0–46.0)
Hemoglobin: 14.2 g/dL (ref 12.0–15.0)
Immature Granulocytes: 4 %
Lymphocytes Relative: 5 %
Lymphs Abs: 1.2 10*3/uL (ref 0.7–4.0)
MCH: 30.1 pg (ref 26.0–34.0)
MCHC: 32.7 g/dL (ref 30.0–36.0)
MCV: 92.1 fL (ref 80.0–100.0)
Monocytes Absolute: 1.7 10*3/uL — ABNORMAL HIGH (ref 0.1–1.0)
Monocytes Relative: 7 %
Neutro Abs: 19.2 10*3/uL — ABNORMAL HIGH (ref 1.7–7.7)
Neutrophils Relative %: 84 %
Platelets: 109 10*3/uL — ABNORMAL LOW (ref 150–400)
RBC: 4.71 MIL/uL (ref 3.87–5.11)
RDW: 15.4 % (ref 11.5–15.5)
WBC: 23.1 10*3/uL — ABNORMAL HIGH (ref 4.0–10.5)
nRBC: 0 % (ref 0.0–0.2)

## 2021-01-31 LAB — GLUCOSE, CAPILLARY
Glucose-Capillary: 182 mg/dL — ABNORMAL HIGH (ref 70–99)
Glucose-Capillary: 202 mg/dL — ABNORMAL HIGH (ref 70–99)
Glucose-Capillary: 221 mg/dL — ABNORMAL HIGH (ref 70–99)
Glucose-Capillary: 188 mg/dL — ABNORMAL HIGH (ref 70–99)

## 2021-01-31 LAB — C-REACTIVE PROTEIN: CRP: 3.1 mg/dL — ABNORMAL HIGH (ref <1.0)

## 2021-01-31 LAB — D-DIMER, QUANTITATIVE: D-Dimer, Quant: 0.33 ug/mL-FEU (ref 0.00–0.50)

## 2021-01-31 MED ORDER — PREDNISONE 20 MG PO TABS
20.0000 mg | ORAL_TABLET | Freq: Every day | ORAL | Status: DC
  Administered 2021-01-31 – 2021-02-01 (×2): 20 mg via ORAL
  Filled 2021-01-31 (×2): qty 1

## 2021-01-31 NOTE — TOC Progression Note (Signed)
Transition of Care [facility]) - Progression Note    Patient Details  Name: Elizabeth Rowland MRN: [id number] Date of Birth: Mar 30, 1941  [facility]) CM/SW Contact  Elizabeth Fitch, RN Phone Number: 01/31/2021, 4:42 PM  Clinical Narrative:     Received notification from Elizabeth Rowland at [facility] that they are able to accept bed covid positive with out a 10 day isolation.   Discussed with son Elizabeth Rowland. He would like to accept the bed at [facility].  In the mean time he is going to discuss with his mom and brother to make sure everyone is in agreement.    If they decide on a home discharge Elizabeth Rowland is to call me first thing in the morning so I can work on arrange DME  Expected Discharge Plan: Home w Home Health Services Barriers to Discharge: Continued Medical Work up  Expected Discharge Plan and Services Expected Discharge Plan: Home w Home Health Services                                               Social Determinants of Health (SDOH) Interventions    Readmission Risk Interventions No flowsheet data found.

## 2021-01-31 NOTE — TOC Progression Note (Signed)
Transition of Care [facility]) - Progression Note    Patient Details  Name: Elizabeth Rowland MRN: [id number] Date of Birth: 1941-01-09  Transition of Care [facility]) CM/SW Contact  Chapman Fitch, RN Phone Number: 01/31/2021, 11:56 AM  Clinical Narrative:      Cased discussed in rounds with MD.  Per MD patient will potentially be stable for discharge tomorrow   Called son to update Son immediately started yelling loudly.  He states "I don't care what you say, that's not what the doctor told me and she can stay here."  Son continued to yell stating "'I'm not going to talk to you about this any more, I'm done with you and reporting you".  He continued yelling, and cursing at this [facility], then hung up the phone  MD and [facility] supervisor notified     Expected Discharge Plan: Home w Home Health Services Barriers to Discharge: Continued Medical Work up  Expected Discharge Plan and Services Expected Discharge Plan: Home w Home Health Services                                               Social Determinants of Health (SDOH) Interventions    Readmission Risk Interventions No flowsheet data found.

## 2021-01-31 NOTE — Progress Notes (Signed)
PROGRESS NOTE  Elizabeth Rowland  DOB: 07-11-1941  PCP: Leanna Sato, MD WVP:[id number]  DOA: 01/26/2021  LOS: 3 days  Hospital Day: 6   Chief Complaint  Patient presents with  . Leg Pain   Brief narrative: Elizabeth Rowland is a 80 y.o. female with PMH significant for HTN, HLD, history of DVT/PE, fibromyalgia, hypertension, hyperlipidemia, currently on tramadol and gabapentin for chronic pain and fibromyalgia. Patient was brought to the ED on 5/13 from the facility by family for ' improper' care. Patient was in the ED for 2 days pending new facility placement. On 5/15, patient was noted to be hypoxic.  COVID test turned out positive. She was admitted to hospitalist service. See below for details.   Subjective: Patient was seen and examined this morning. Elderly Caucasian female.  Lying on bed not in distress. On 3 L oxygen by nasal cannula.  Assessment/Plan: Sepsis secondary to COVID pneumonia Acute respiratory failure with hypoxia  -Presented with hypoxia, elevated white count, elevated lactic acid -COVID test: PCR positive -Chest imaging: Chest x-ray showed bibasilar opacities. -Treatment: To complete 5-day course of IV remdesivir on 5/19, IV steroids -Oxygen - SpO2: 95 % O2 Flow Rate (L/min): 3 L/min  -Supportive care: Vitamin C, Zinc, PRN inhalers, Tylenol, Antitussives (benzonatate/ Mucinex/Tussionex).   -Encouraged incentive spirometry, prone position, out of bed and early mobilization as much as possible -Continue airborne/contact isolation precautions for duration of 3 weeks from the day of diagnosis. -WBC and inflammatory markers trend as below.  Recent Labs  Lab 01/28/21 1239 01/28/21 1257 01/28/21 1450 01/28/21 1457 01/29/21 0435 01/29/21 0832 01/29/21 1710 01/30/21 0432 01/30/21 0834 01/30/21 1135 01/30/21 1548 01/30/21 1842 01/31/21 0626  SARSCOV2NAA POSITIVE*  --   --   --   --   --   --   --   --   --   --   --   --   WBC  --  17.4*  --    --  14.8*  --   --  22.3*  --   --   --   --  23.1*  LATICACIDVEN  --   --   --    < >  --    < > 3.2*  --  1.4 3.5* 5.3* 3.4*  --   PROCALCITON  --  0.12  --   --  0.14  --   --  <0.10  --   --   --   --   --   DDIMER  --   --  0.48  --  0.38  --   --  0.39  --   --   --   --  0.33  CRP  --   --   --   --  9.7*  --   --  6.9*  --   --   --   --  3.1*  ALT  --   --   --   --  94*  --   --  66*  --   --   --   --  54*   < > = values in this interval not displayed.   Lactic acidosis -Lactic acid level was up and peaked at 5.3 on 5/17.  Trending down.  Repeat tomorrow. -Unclear etiology.  Patient was not significantly hypoxic or hypotensive. -No other possible cause of type B lactic acidosis Recent Labs  Lab 01/28/21 1257 01/28/21 1457 01/29/21 0435 01/29/21 0832 01/30/21 0432 01/30/21  1610 01/30/21 1135 01/30/21 1548 01/30/21 1842  LATICACIDVEN  --    < >  --    < >  --    < > 3.5* 5.3* 3.4*  PROCALCITON 0.12  --  0.14  --  <0.10  --   --   --   --    < > = values in this interval not displayed.   Type 2 diabetes mellitus -A1c 8 on 5/15 -Not on any home meds. -Currently blood sugar level is running high because of concomitant treatment with steroids. -Currently on Lantus 17 units twice daily, NovoLog 3 units Premeal 3 times daily. -Expect improvement with tapering down of steroid dose. Recent Labs  Lab 01/30/21 1314 01/30/21 1659 01/30/21 2108 01/31/21 0758 01/31/21 1201  GLUCAP 413* 381* 151* 182* 202*   Essential hypertension -On Lasix 40 mg daily at home.   -Currently on hold.    Fibromyalgia/chronic pain -continue tramadol  Peripheral neuropathy -continue gabapentin  History of bilateral segmental and subsegmental pulmonary emboli Right peroneal DVT -on Eliquis -D-dimer on admission was 0.48, therefore no indication for repeat CTA at this time  Hyperlipidemia -On pravastatin  Mobility: Encourage ambulation.  Patient is very resistant to any movement  when tried by nursing staff Code Status:   Code Status: Full Code  Nutritional status: Body mass index is 36.67 kg/m.     Diet Order            Diet heart healthy/carb modified Room service appropriate? Yes; Fluid consistency: Thin  Diet effective now                 DVT prophylaxis: Place TED hose Start: 01/28/21 1447 apixaban (ELIQUIS) tablet 5 mg   Antimicrobials:  None Fluid: NS at 50 mill per hour Consultants: None Family Communication:  Called and updated patient's son Mr. Elizabeth Rowland this afternoon.  Status is: Inpatient  Remains inpatient appropriate because: To complete IV remdesivir on 5/19  Dispo: The patient is from: Facility              Anticipated d/c is to: New SNF              Patient currently is not medically stable to d/c.   Difficult to place patient Yes     Infusions:  . sodium chloride 50 mL/hr at 01/31/21 0719  . remdesivir 100 mg in NS 100 mL 100 mg (01/31/21 0921)    Scheduled Meds: . apixaban  5 mg Oral BID  . gabapentin  300 mg Oral TID  . insulin aspart  0-20 Units Subcutaneous TID WC  . insulin aspart  0-5 Units Subcutaneous QHS  . insulin aspart  3 Units Subcutaneous TID WC  . insulin glargine  17 Units Subcutaneous BID  . multivitamin with minerals  1 tablet Oral Daily  . nystatin  5 mL Oral QID  . polyethylene glycol  17 g Oral Daily  . potassium chloride SA  20 mEq Oral Daily  . predniSONE  20 mg Oral Daily  . traMADol  100 mg Oral TID    Antimicrobials: Anti-infectives (From admission, onward)   Start     Dose/Rate Route Frequency Ordered Stop   01/29/21 1000  remdesivir 100 mg in sodium chloride 0.9 % 100 mL IVPB       "Followed by" Linked Group Details   100 mg 200 mL/hr over 30 Minutes Intravenous Daily 01/28/21 1431 02/02/21 0959   01/28/21 2000  azithromycin (ZITHROMAX) 500 mg in  sodium chloride 0.9 % 250 mL IVPB  Status:  Discontinued        500 mg 250 mL/hr over 60 Minutes Intravenous Every 24 hours 01/28/21  1703 01/30/21 1024   01/28/21 2000  cefTRIAXone (ROCEPHIN) 1 g in sodium chloride 0.9 % 100 mL IVPB  Status:  Discontinued        1 g 200 mL/hr over 30 Minutes Intravenous Every 24 hours 01/28/21 1703 01/30/21 1024   01/28/21 1700  remdesivir 200 mg in sodium chloride 0.9% 250 mL IVPB       "Followed by" Linked Group Details   200 mg 580 mL/hr over 30 Minutes Intravenous Once 01/28/21 1431 01/29/21 0832      PRN meds: chlorpheniramine-HYDROcodone, guaiFENesin-dextromethorphan, hydrALAZINE, ondansetron **OR** ondansetron (ZOFRAN) IV, polyethylene glycol   Objective: Vitals:   01/31/21 0916 01/31/21 1203  BP: 128/60 (!) 150/71  Pulse: 80 69  Resp: 20 18  Temp: 98.1 F (36.7 C) 98.2 F (36.8 C)  SpO2: 95% 95%    Intake/Output Summary (Last 24 hours) at 01/31/2021 1511 Last data filed at 01/31/2021 0719 Gross per 24 hour  Intake 944.61 ml  Output 801 ml  Net 143.61 ml   Filed Weights   01/26/21 1713  Weight: 93.9 kg   Weight change:  Body mass index is 36.67 kg/m.   Physical Exam: General exam: Pleasant elderly Caucasian female.  Not in distress at rest Skin: No rashes, lesions or ulcers. HEENT: Atraumatic, normocephalic, no obvious bleeding Lungs: Clear to auscultation bilaterally CVS: Regular rate and rhythm, no murmur GI/Abd soft, nontender, nondistended, bowel sound present CNS: Alert, awake, slow to respond but oriented x3 Psychiatry: Depressed look Extremities: Trace bilateral pedal edema, chronic stasis changes and scaling of limbs  Data Review: I have personally reviewed the laboratory data and studies available.  Recent Labs  Lab 01/28/21 1257 01/29/21 0435 01/30/21 0432 01/31/21 0626  WBC 17.4* 14.8* 22.3* 23.1*  NEUTROABS 9.8* 9.6* 16.7* 19.2*  HGB 15.5* 14.4 13.6 14.2  HCT 49.2* 44.8 41.1 43.4  MCV 93.2 90.7 90.3 92.1  PLT 102* 71* 88* 109*   Recent Labs  Lab 01/28/21 1257 01/28/21 1956 01/29/21 0435 01/30/21 0432 01/31/21 0626  NA 135  --   138 136 139  K 3.4*  --  3.7 3.5 4.1  CL 90*  --  96* 97* 101  CO2 32  --  31 29 31   GLUCOSE 298* 396* 187* 218* 180*  BUN 16  --  15 19 22   CREATININE 0.83  --  0.70 0.75 0.67  CALCIUM 8.7*  --  8.2* 8.0* 8.3*  MG 1.5*  --  2.0  --   --   PHOS  --   --  3.6  --   --     F/u labs ordered Unresulted Labs (From admission, onward)          Start     Ordered   02/01/21 0500  Lactic acid, plasma  Tomorrow morning,   R        01/31/21 0818   01/29/21 0500  CBC with Differential/Platelet  Daily,   STAT      01/28/21 1449   01/29/21 0500  Comprehensive metabolic panel  Daily,   STAT      01/28/21 1449   01/29/21 0500  C-reactive protein  Daily,   STAT      01/28/21 1449   01/28/21 1450  D-dimer, quantitative  Daily,   STAT  01/28/21 1449          Signed, Lorin Glass, MD [facility] 01/31/2021

## 2021-01-31 NOTE — TOC Progression Note (Signed)
Transition of Care [facility]) - Progression Note    Patient Details  Name: Elizabeth Rowland MRN: [id number] Date of Birth: 1941-09-13  Transition of Care [facility]) CM/SW Contact  Chapman Fitch, RN Phone Number: 01/31/2021, 9:57 AM  Clinical Narrative:     Received call from admissions coordinator at [facility] in [facility].  They are able to offer a covid bed to patient  If patient medically ready for discharge prior to the 10 day isolation period the options are as follows 1. Return home with family, home health, and DME 2. Return to [facility] 3. Return to [facility] with the intention of transferring to another facility at the end of the isolation period 4. Accept bed at [facility]  I reached out to son [name] to update.  He was short and assertive with responses.  He states "I don't want to talk to you about this".  I let him know that since he is the patient's primary care giver I would need him to participate in the discharge planning in order to ensure we have a safe discharge.   He states "im not sending her up there, she isn't going back to [facility], i'm probably going to send her to my brother's house in [place] he is strong."   Based on that response I let him know that Per his request I would no be holding her a SNF bed, and plan for a home discharge.  I did let him know that when patient with PT on 5/16 she required a significant amount of assistance, and per PT she would require additional medical equipment in the home such as hospital bed, lift, WC etc and if she would be going to her other son's home I would need to coordinate this with him  If patient medically requires a length of stay past the 10 day isolation period we would be able to see if there is another local SNF that could offer a bed  All of this information was provided to son, and he hung up at the end of the conversation   Expected Discharge Plan: Home w Home Health  Services Barriers to Discharge: Continued Medical Work up  Expected Discharge Plan and Services Expected Discharge Plan: Home w Home Health Services                                               Social Determinants of Health (SDOH) Interventions    Readmission Risk Interventions No flowsheet data found.

## 2021-02-01 DIAGNOSIS — E1169 Type 2 diabetes mellitus with other specified complication: Secondary | ICD-10-CM | POA: Insufficient documentation

## 2021-02-01 DIAGNOSIS — E785 Hyperlipidemia, unspecified: Secondary | ICD-10-CM | POA: Insufficient documentation

## 2021-02-01 LAB — COMPREHENSIVE METABOLIC PANEL
ALT: 50 U/L — ABNORMAL HIGH (ref 0–44)
AST: 26 U/L (ref 15–41)
Albumin: 2.6 g/dL — ABNORMAL LOW (ref 3.5–5.0)
Alkaline Phosphatase: 55 U/L (ref 38–126)
Anion gap: 8 (ref 5–15)
BUN: 24 mg/dL — ABNORMAL HIGH (ref 8–23)
CO2: 29 mmol/L (ref 22–32)
Calcium: 8.3 mg/dL — ABNORMAL LOW (ref 8.9–10.3)
Chloride: 100 mmol/L (ref 98–111)
Creatinine, Ser: 0.67 mg/dL (ref 0.44–1.00)
GFR, Estimated: >60 mL/min (ref >60)
Glucose, Bld: 147 mg/dL — ABNORMAL HIGH (ref 70–99)
Potassium: 4.4 mmol/L (ref 3.5–5.1)
Sodium: 137 mmol/L (ref 135–145)
Total Bilirubin: 0.7 mg/dL (ref 0.3–1.2)
Total Protein: 5.4 g/dL — ABNORMAL LOW (ref 6.5–8.1)

## 2021-02-01 LAB — CBC WITH DIFFERENTIAL/PLATELET
Abs Immature Granulocytes: 0.91 10*3/uL — ABNORMAL HIGH (ref 0.00–0.07)
Basophils Absolute: 0.1 10*3/uL (ref 0.0–0.1)
Basophils Relative: 0 %
Eosinophils Absolute: 0.2 10*3/uL (ref 0.0–0.5)
Eosinophils Relative: 1 %
HCT: 45.3 % (ref 36.0–46.0)
Hemoglobin: 14.5 g/dL (ref 12.0–15.0)
Immature Granulocytes: 4 %
Lymphocytes Relative: 6 %
Lymphs Abs: 1.3 10*3/uL (ref 0.7–4.0)
MCH: 29.7 pg (ref 26.0–34.0)
MCHC: 32 g/dL (ref 30.0–36.0)
MCV: 92.6 fL (ref 80.0–100.0)
Monocytes Absolute: 2.4 10*3/uL — ABNORMAL HIGH (ref 0.1–1.0)
Monocytes Relative: 12 %
Neutro Abs: 16.2 10*3/uL — ABNORMAL HIGH (ref 1.7–7.7)
Neutrophils Relative %: 77 %
Platelets: 115 10*3/uL — ABNORMAL LOW (ref 150–400)
RBC: 4.89 MIL/uL (ref 3.87–5.11)
RDW: 15.2 % (ref 11.5–15.5)
WBC: 21 10*3/uL — ABNORMAL HIGH (ref 4.0–10.5)
nRBC: 0 % (ref 0.0–0.2)

## 2021-02-01 LAB — D-DIMER, QUANTITATIVE: D-Dimer, Quant: 0.35 ug/mL-FEU (ref 0.00–0.50)

## 2021-02-01 LAB — C-REACTIVE PROTEIN: CRP: 1.7 mg/dL — ABNORMAL HIGH (ref <1.0)

## 2021-02-01 LAB — GLUCOSE, CAPILLARY
Glucose-Capillary: 117 mg/dL — ABNORMAL HIGH (ref 70–99)
Glucose-Capillary: 127 mg/dL — ABNORMAL HIGH (ref 70–99)

## 2021-02-01 LAB — LACTIC ACID, PLASMA: Lactic Acid, Venous: 1.1 mmol/L (ref 0.5–1.9)

## 2021-02-01 MED ORDER — INSULIN ASPART 100 UNIT/ML IJ SOLN: 0.0000 [IU] | Freq: Every day | INTRAMUSCULAR | 11 refills | Status: DC

## 2021-02-01 MED ORDER — PREDNISONE 20 MG PO TABS: 20.0000 mg | ORAL_TABLET | Freq: Every day | ORAL | Status: AC

## 2021-02-01 MED ORDER — TRAMADOL HCL 50 MG PO TABS: 100.0000 mg | ORAL_TABLET | Freq: Three times a day (TID) | ORAL | 0 refills | Status: AC

## 2021-02-01 MED ORDER — INSULIN ASPART 100 UNIT/ML IJ SOLN: 0.0000 [IU] | Freq: Three times a day (TID) | INTRAMUSCULAR | 11 refills | Status: DC

## 2021-02-01 MED ORDER — INSULIN GLARGINE 100 UNIT/ML ~~LOC~~ SOLN: 10.0000 [IU] | Freq: Two times a day (BID) | SUBCUTANEOUS | 11 refills | Status: AC

## 2021-02-01 MED ORDER — GUAIFENESIN-DM 100-10 MG/5ML PO SYRP: 10.0000 mL | ORAL_SOLUTION | ORAL | 0 refills | Status: DC | PRN

## 2021-02-01 NOTE — TOC Transition Note (Signed)
Transition of Care [facility]) - CM/SW Discharge Note   Patient Details  Name: Elizabeth Rowland MRN: [id number] Date of Birth: 1940/11/03  Transition of Care [facility]) CM/SW Contact:  Chapman Fitch, RN Phone Number: 02/01/2021, 2:27 PM   Clinical Narrative:    Patient and son in agreement for [facility].   Patient to discharge to SNF today  Discharge info sent in [facility] at [place] notified Bedside RN to call report EMS transport called    Final next level of care: Skilled Nursing Facility Barriers to Discharge: No Barriers Identified   Patient Goals and CMS Choice        Discharge Placement              Patient chooses bed at: [facility] Patient to be transferred to facility by: EMS Name of family member notified: [name]    Discharge Plan and Services                                     Social Determinants of Health (SDOH) Interventions     Readmission Risk Interventions No flowsheet data found.

## 2021-02-01 NOTE — Discharge Summary (Signed)
Physician Discharge Summary  Elizabeth Rowland:[id number] DOB: 1941/01/29 DOA: 01/26/2021  PCP: Leanna Sato, MD  Admit date: 01/26/2021 Discharge date: 02/01/2021  Admitted From: SNF Discharge disposition: New SNF   Code Status: Full Code  Diet Recommendation: Cardiac diet Discharge Diagnosis:   Principal Problem:   Pneumonia due to COVID-19 virus Active Problems:   Hyperlipidemia   Fibromyalgia   Essential hypertension   Acute respiratory failure with hypoxia (HCC)   Pulmonary emboli (HCC)   Hypomagnesemia   Hypokalemia   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HCC)   Type 2 diabetes mellitus with hyperlipidemia [facility])  Chief Complaint  Patient presents with  . Leg Pain   Brief narrative: Elizabeth Rowland is a 80 y.o. female with PMH significant for HTN, HLD, history of DVT/PE, fibromyalgia, hypertension, hyperlipidemia, currently on tramadol and gabapentin for chronic pain and fibromyalgia. Patient was brought to the ED on 5/13 from the facility by family for ' improper' care. Patient was in the ED for 2 days pending new facility placement. On 5/15, patient was noted to be hypoxic.  COVID test turned out positive. She was admitted to hospitalist service. See below for details.   Subjective: Patient was seen and examined this morning. Elderly Caucasian female.  Lying on bed not in distress. On 3 L oxygen by nasal cannula. No new symptoms.  Hospital course: Sepsis secondary to COVID pneumonia Acute respiratory failure with hypoxia  -Presented with hypoxia, elevated white count, elevated lactic acid -COVID test: PCR positive -Chest imaging: Chest x-ray showed bibasilar opacities. -Treatment: Patient completed 5-day course of IV remdesivir today.  Currently on oral prednisone.  5 more days to continue post discharge. -Oxygen - SpO2: 97 % O2 Flow Rate (L/min): 3 L/min  -Supportive care: Vitamin C, Zinc, PRN inhalers, Tylenol, Antitussives (benzonatate/ Mucinex/Tussionex).    -Encouraged incentive spirometry, prone position, out of bed and early mobilization as much as possible -Continue airborne/contact isolation precautions for duration of 3 weeks from the day of diagnosis. -WBC and inflammatory markers trend as below.  Recent Labs  Lab 01/28/21 1239 01/28/21 1257 01/28/21 1450 01/28/21 1457 01/29/21 0435 01/29/21 4709 01/30/21 0432 01/30/21 0834 01/30/21 1135 01/30/21 1548 01/30/21 1842 01/31/21 0626 02/01/21 0520  SARSCOV2NAA POSITIVE*  --   --   --   --   --   --   --   --   --   --   --   --   WBC  --  17.4*  --   --  14.8*  --  22.3*  --   --   --   --  23.1* 21.0*  LATICACIDVEN  --   --   --    < >  --    < >  --  1.4 3.5* 5.3* 3.4*  --  1.1  PROCALCITON  --  0.12  --   --  0.14  --  <0.10  --   --   --   --   --   --   DDIMER  --   --  0.48  --  0.38  --  0.39  --   --   --   --  0.33 0.35  CRP  --   --   --   --  9.7*  --  6.9*  --   --   --   --  3.1* 1.7*  ALT  --   --   --   --  94*  --  66*  --   --   --   --  54* 50*   < > = values in this interval not displayed.   Lactic acidosis -Lactic acid level was up and peaked at 5.3 on 5/17.   -Unclear etiology.  Patient was not significantly hypoxic or hypotensive. -No other possible cause of type B lactic acidosis. -Trended down to normal on repeat check today. Recent Labs  Lab 01/28/21 1257 01/28/21 1457 01/29/21 0435 01/29/21 7001 01/30/21 0432 01/30/21 0834 01/30/21 1548 01/30/21 1842 02/01/21 0520  LATICACIDVEN  --    < >  --    < >  --    < > 5.3* 3.4* 1.1  PROCALCITON 0.12  --  0.14  --  <0.10  --   --   --   --    < > = values in this interval not displayed.   Type 2 diabetes mellitus -A1c 8 on 5/15 -Not on any home meds. -Currently blood sugar level is running high because of concomitant treatment with steroids. -Currently on Lantus 17 units twice daily, NovoLog 3 units Premeal 3 times daily. -Expect improvement with tapering down of steroid dose.  -Discharged on 10  units of Lantus twice daily with sliding scale insulin.  Insulin dose may need to be adjusted based on blood sugar response. Recent Labs  Lab 01/31/21 1201 01/31/21 1644 01/31/21 2134 02/01/21 0745 02/01/21 1148  GLUCAP 202* 221* 188* 117* 127*   Essential hypertension -On Lasix 40 mg daily at home.   -Resume the same post discharge  Fibromyalgia/chronic pain -continue tramadol  Peripheral neuropathy -continue gabapentin  History of bilateral segmental and subsegmental pulmonary emboli Right peroneal DVT -on Eliquis -D-dimer on admission was 0.48, therefore no indication for repeat CTA at this time  Hyperlipidemia -On pravastatin  Lack of motivation -Patient has significant lack of motivation to movement.  She is at high risk of getting bedbound, pressure ulcers and all other complications.  I explained this to patient and her son as well.  Patient is to be encouraged repeatedly to be out of bed.  Wound care:    Discharge Exam:   Vitals:   01/31/21 2055 01/31/21 2335 02/01/21 0443 02/01/21 0749  BP: (!) 164/89 (!) 153/75 (!) 148/76 (!) 158/83  Pulse: 80 74 72 74  Resp: (!) 22 18 18 16   Temp: 97.6 F (36.4 C) 97.8 F (36.6 C) 97.7 F (36.5 C)   TempSrc:      SpO2: 97% 92% 96% 97%  Weight:      Height:        Body mass index is 36.67 kg/m.  General exam: Pleasant elderly Caucasian female.  Not in distress Skin: No rashes, lesions or ulcers. HEENT: Atraumatic, normocephalic, no obvious bleeding Lungs: Clear to auscultation bilaterally CVS: Regular rate and rhythm, no murmur GI/Abd soft, nontender, nondistended, bowel sounds present CNS: Alert, awake, oriented x3 Psychiatry: Mood appropriate Extremities: No pedal edema, no calf tenderness  Follow ups:   Discharge Instructions    Diet - low sodium heart healthy   Complete by: As directed    Diet Carb Modified   Complete by: As directed    Increase activity slowly   Complete by: As directed        Follow-up Information    , MD Follow up.   Specialty: Family Medicine Contact information: [address] [place] [phone]  Recommendations for Outpatient Follow-Up:   1. Follow-up with PCP as an outpatient  Discharge Instructions:  Follow with Primary MD Leanna SatoMiles, Linda M, MD in 7 days   Get CBC/BMP checked in next visit within 1 week by PCP or SNF MD ( we routinely change or add medications that can affect your baseline labs and fluid status, therefore we recommend that you get the mentioned basic workup next visit with your PCP, your PCP may decide not to get them or add new tests based on their clinical decision)  On your next visit with your PCP, please Get Medicines reviewed and adjusted.  Please request your PCP  to go over all Hospital Tests and Procedure/Radiological results at the follow up, please get all Hospital records sent to your Prim MD by signing hospital release before you go home.  Activity: As tolerated with Full fall precautions use walker/cane & assistance as needed  For Heart failure patients - Check your Weight same time everyday, if you gain over 2 pounds, or you develop in leg swelling, experience more shortness of breath or chest pain, call your Primary MD immediately. Follow Cardiac Low Salt Diet and 1.5 lit/day fluid restriction.  If you have smoked or chewed Tobacco in the last 2 yrs please stop smoking, stop any regular Alcohol  and or any Recreational drug use.  If you experience worsening of your admission symptoms, develop shortness of breath, life threatening emergency, suicidal or homicidal thoughts you must seek medical attention immediately by calling 911 or calling your MD immediately  if symptoms less severe.  You Must read complete instructions/literature along with all the possible adverse reactions/side effects for all the Medicines you take and that have been prescribed to you. Take any new  Medicines after you have completely understood and accpet all the possible adverse reactions/side effects.   Do not drive, operate heavy machinery, perform activities at heights, swimming or participation in water activities or provide baby sitting services if your were admitted for syncope or siezures until you have seen by Primary MD or a Neurologist and advised to do so again.  Do not drive when taking Pain medications.  Do not take more than prescribed Pain, Sleep and Anxiety Medications  Wear Seat belts while driving.   Please note You were cared for by a hospitalist during your hospital stay. If you have any questions about your discharge medications or the care you received while you were in the hospital after you are discharged, you can call the unit and asked to speak with the hospitalist on call if the hospitalist that took care of you is not available. Once you are discharged, your primary care physician will handle any further medical issues. Please note that NO REFILLS for any discharge medications will be authorized once you are discharged, as it is imperative that you return to your primary care physician (or establish a relationship with a primary care physician if you do not have one) for your aftercare needs so that they can reassess your need for medications and monitor your lab values.    Allergies as of 02/01/2021   Not on File     Medication List    TAKE these medications   apixaban 5 MG Tabs tablet Commonly known as: ELIQUIS Take 1 tablet (5 mg total) by mouth 2 (two) times daily.   docusate sodium 100 MG capsule Commonly known as: COLACE Take 100 mg by mouth 2 (two) times daily.   furosemide 40 MG tablet  Commonly known as: Lasix Take 1 tablet (40 mg total) by mouth daily.   gabapentin 300 MG capsule Commonly known as: NEURONTIN Take 1 capsule by mouth 3 (three) times daily.   guaiFENesin-dextromethorphan 100-10 MG/5ML syrup Commonly known as: ROBITUSSIN  DM Take 10 mLs by mouth every 4 (four) hours as needed for cough.   insulin aspart 100 UNIT/ML injection Commonly known as: novoLOG Inject 0-20 Units into the skin 3 (three) times daily with meals.   insulin aspart 100 UNIT/ML injection Commonly known as: novoLOG Inject 0-5 Units into the skin at bedtime.   insulin glargine 100 UNIT/ML injection Commonly known as: LANTUS Inject 0.1 mLs (10 Units total) into the skin 2 (two) times daily.   multivitamin with minerals Tabs tablet Take 1 tablet by mouth daily.   nystatin 100000 UNIT/ML suspension Commonly known as: MYCOSTATIN Take 5 mLs (500,000 Units total) by mouth 4 (four) times daily. X 10 days   polyethylene glycol 17 g packet Commonly known as: MIRALAX / GLYCOLAX Take 17 g by mouth daily as needed for mild constipation, moderate constipation or severe constipation.   potassium chloride SA 20 MEQ tablet Commonly known as: KLOR-CON Take 1 tablet (20 mEq total) by mouth daily.   pravastatin 10 MG tablet Commonly known as: PRAVACHOL Take 10 mg by mouth daily.   predniSONE 20 MG tablet Commonly known as: DELTASONE Take 1 tablet (20 mg total) by mouth daily for 5 days. Start taking on: Feb 02, 2021 What changed:   medication strength  how much to take  how to take this  when to take this  additional instructions   traMADol 50 MG tablet Commonly known as: ULTRAM Take 2 tablets (100 mg total) by mouth in the morning, at noon, and at bedtime for 5 days.       Time coordinating discharge: 35 minutes  The results of significant diagnostics from this hospitalization (including imaging, microbiology, ancillary and laboratory) are listed below for reference.    Procedures and Diagnostic Studies:   No results found.   Labs:   Basic Metabolic Panel: Recent Labs  Lab 01/28/21 1257 01/28/21 1956 01/29/21 0435 01/30/21 0432 01/31/21 0626 02/01/21 0520  NA 135  --  138 136 139 137  K 3.4*  --  3.7 3.5 4.1  4.4  CL 90*  --  96* 97* 101 100  CO2 32  --  GLUCOSE 298* 396* 187* 218* 180* 147*  BUN 16  --  24*  CREATININE 0.83  --  0.70 0.75 0.67 0.67  CALCIUM 8.7*  --  8.2* 8.0* 8.3* 8.3*  MG 1.5*  --  2.0  --   --   --   PHOS  --   --  3.6  --   --   --    GFR Estimated Creatinine Clearance: 60.1 mL/min (by C-G formula based on SCr of 0.67 mg/dL). Liver Function Tests: Recent Labs  Lab 01/29/21 0435 01/30/21 0432 01/31/21 0626 02/01/21 0520  AST 84* 46* 30 26  ALT 94* 66* 54* 50*  ALKPHOS 54 51 55 55  BILITOT 0.7 0.6 0.6 0.7  PROT 5.7* 5.4* 5.5* 5.4*  ALBUMIN 2.8* 2.6* 2.7* 2.6*   No results for input(s): LIPASE, AMYLASE in the last 168 hours. No results for input(s): AMMONIA in the last 168 hours. Coagulation profile No results for input(s): INR, PROTIME in the last 168 hours.  CBC: Recent Labs  Lab 01/28/21 1257  01/29/21 0435 01/30/21 0432 01/31/21 0626 02/01/21 0520  WBC 17.4* 14.8* 22.3* 23.1* 21.0*  NEUTROABS 9.8* 9.6* 16.7* 19.2* 16.2*  HGB 15.5* 14.4 13.6 14.2 14.5  HCT 49.2* 44.8 41.1 43.4 45.3  MCV 93.2 90.7 90.3 92.1 92.6  PLT 102* 71* 88* 109* 115*   Cardiac Enzymes: No results for input(s): CKTOTAL, CKMB, CKMBINDEX, TROPONINI in the last 168 hours. BNP: Invalid input(s): POCBNP CBG: Recent Labs  Lab 01/31/21 1201 01/31/21 1644 01/31/21 2134 02/01/21 0745 02/01/21 1148  GLUCAP 202* 221* 188* 117* 127*   D-Dimer Recent Labs    01/31/21 0626 02/01/21 0520  DDIMER 0.33 0.35   Hgb A1c No results for input(s): HGBA1C in the last 72 hours. Lipid Profile No results for input(s): CHOL, HDL, LDLCALC, TRIG, CHOLHDL, LDLDIRECT in the last 72 hours. Thyroid function studies No results for input(s): TSH, T4TOTAL, T3FREE, THYROIDAB in the last 72 hours.  Invalid input(s): FREET3 Anemia work up No results for input(s): VITAMINB12, FOLATE, FERRITIN, TIBC, IRON, RETICCTPCT in the last 72 hours. Microbiology Recent Results (from  the past 240 hour(s))  Resp Panel by RT-PCR (Flu A&B, Covid) Nasopharyngeal Swab     Status: Abnormal   Collection Time: 01/28/21 12:39 PM   Specimen: Nasopharyngeal Swab; Nasopharyngeal(NP) swabs in vial transport medium  Result Value Ref Range Status   SARS Coronavirus 2 by RT PCR POSITIVE (A) NEGATIVE Final    Comment: RESULT CALLED TO, READ BACK BY AND VERIFIED WITH: JESSICA COLTRANE AT 1356 01/28/21.PMF (NOTE) SARS-CoV-2 target nucleic acids are DETECTED.  The SARS-CoV-2 RNA is generally detectable in upper respiratory specimens during the acute phase of infection. Positive results are indicative of the presence of the identified virus, but do not rule out bacterial infection or co-infection with other pathogens not detected by the test. Clinical correlation with patient history and other diagnostic information is necessary to determine patient infection status. The expected result is Negative.  Fact Sheet for Patients: BloggerCourse.com  Fact Sheet for Healthcare Providers: SeriousBroker.it  This test is not yet approved or cleared by the [place] FDA and  has been authorized for detection and/or diagnosis of SARS-CoV-2 by FDA under an Emergency Use Authorization (EUA).  This EUA will remain in effect (meaning this test can  be used) for the duration of  the COVID-19 declaration under Section 564(b)(1) of the Act, 21 U.S.C. section 360bbb-3(b)(1), unless the authorization is terminated or revoked sooner.     Influenza A by PCR NEGATIVE NEGATIVE Final   Influenza B by PCR NEGATIVE NEGATIVE Final    Comment: (NOTE) The Xpert Xpress SARS-CoV-2/FLU/RSV plus assay is intended as an aid in the diagnosis of influenza from Nasopharyngeal swab specimens and should not be used as a sole basis for treatment. Nasal washings and aspirates are unacceptable for Xpert Xpress SARS-CoV-2/FLU/RSV testing.  Fact Sheet for  Patients: BloggerCourse.com  Fact Sheet for Healthcare Providers: SeriousBroker.it  This test is not yet approved or cleared by the [place] FDA and has been authorized for detection and/or diagnosis of SARS-CoV-2 by FDA under an Emergency Use Authorization (EUA). This EUA will remain in effect (meaning this test can be used) for the duration of the COVID-19 declaration under Section 564(b)(1) of the Act, 21 U.S.C. section 360bbb-3(b)(1), unless the authorization is terminated or revoked.  Performed at [facility], [address] [place] [place], [place] [address]      Signed: Lorin Glass  [facility] 02/01/2021, 12:19 PM

## 2021-02-01 NOTE — Care Management Important Message (Signed)
Important Message  Patient Details  Name: Elizabeth Rowland MRN: [id number] Date of Birth: [birth year]   Medicare Important Message Given:  Yes - Important Message mailed due to current National Emergency  Reviewed Medicare IM with patient via room phone due to isolation status.  Requested copy of Medicare IM be mailed to her home address on file.    [name] 02/01/2021, 1:50 PM

## 2021-02-01 NOTE — Progress Notes (Addendum)
Patient discharged to [facility] Room 701 via [facility] with Cala Bradford and Baldo Ash.  Patient VSS, on 3 L of )2 via [place].  Son aware that patient is on her way to facility.  Son will come to hospital to receive patient belongings - arranged with RN.  PIV x1 removed, no bleeding, intact.  Report called to [facility] and given to [place].  All questions answered. Patient care transferred.

## 2021-02-01 NOTE — Plan of Care (Signed)

## 2021-02-01 NOTE — Plan of Care (Signed)

## 2021-10-30 ENCOUNTER — Other Ambulatory Visit: Payer: Self-pay

## 2021-10-30 ENCOUNTER — Ambulatory Visit (INDEPENDENT_AMBULATORY_CARE_PROVIDER_SITE_OTHER): Payer: Medicare Other | Admitting: Vascular Surgery

## 2021-10-30 VITALS — BP 151/80 | HR 120 | Ht 67.0 in | Wt 200.0 lb

## 2021-10-30 DIAGNOSIS — M7989 Other specified soft tissue disorders: Secondary | ICD-10-CM

## 2021-10-30 DIAGNOSIS — I10 Essential (primary) hypertension: Secondary | ICD-10-CM

## 2021-10-30 DIAGNOSIS — I824Y1 Acute embolism and thrombosis of unspecified deep veins of right proximal lower extremity: Secondary | ICD-10-CM

## 2021-10-30 DIAGNOSIS — E1169 Type 2 diabetes mellitus with other specified complication: Secondary | ICD-10-CM

## 2021-10-30 DIAGNOSIS — I2694 Multiple subsegmental pulmonary emboli without acute cor pulmonale: Secondary | ICD-10-CM | POA: Diagnosis not present

## 2021-10-30 DIAGNOSIS — E785 Hyperlipidemia, unspecified: Secondary | ICD-10-CM

## 2021-10-30 DIAGNOSIS — E782 Mixed hyperlipidemia: Secondary | ICD-10-CM

## 2021-10-30 NOTE — Patient Instructions (Signed)
Lymphedema Lymphedema is swelling that is caused by the abnormal collection of lymph in the tissues under the skin. Lymph is excess fluid from the tissues in your body that is removed through the lymphatic system. This system is part of your body's defense system (immune system) and includes lymph nodes and lymph vessels. The lymph vessels collect and carry the excess fluid, fats, proteins, and waste from the tissues of the body to the bloodstream. This system also works to clean and remove bacteria and waste products from the body. Lymphedema occurs when the lymphatic system is blocked. When the lymph vessels or lymph nodes are blocked or damaged, lymph does not drain properly. This causes an abnormal buildup of lymph, which leads to swelling in the affected area. This may include the trunk area, or an arm or leg. Lymphedema cannot be cured by medicines, but various methods can be used to help reduce the swelling. What are the causes? The cause of this condition depends on the type of lymphedema that you have. Primary lymphedema is caused by the absence of lymph vessels or having abnormal lymph vessels at birth. Secondary lymphedema occurs when lymph vessels are blocked or damaged. Secondary lymphedema is more common. Common causes of lymph vessel blockage include: Skin infection, such as cellulitis. Infection by parasites (filariasis). Injury. Radiation therapy. Cancer. Formation of scar tissue. Surgery. What are the signs or symptoms? Symptoms of this condition include: Swelling of the arm or leg. A heavy or tight feeling in the arm or leg. Swelling of the feet, toes, or fingers. Shoes or rings may fit more tightly than before. Redness of the skin over the affected area. Limited movement of the affected limb. Sensitivity to touch or discomfort in the affected limb. How is this diagnosed? This condition may be diagnosed based on: Your symptoms and medical history. A physical  exam. Bioimpedance spectroscopy. In this test, painless electrical currents are used to measure fluid levels in your body. Imaging tests, such as: MRI. CT scan. Duplex ultrasound. This test uses sound waves to produce images of the vessels and the blood flow on a screen. Lymphoscintigraphy. In this test, a low dose of a radioactive substance is injected to trace the flow of lymph through your lymph vessels. Lymphangiography. In this test, a contrast dye is injected into the lymph vessel to help show blockages. How is this treated? If an underlying condition is causing the lymphedema, that condition will be treated. For example, antibiotic medicines may be used to treat an infection. Treatment for this condition will depend on the cause of your lymphedema. Treatment may include: Complete decongestive therapy (CDT). This is done by a certified lymphedema therapist to reduce fluid congestion. This therapy includes: Skin care. Compression wrapping of the affected area. Manual lymph drainage. This is a special massage technique that promotes lymph drainage out of a limb. Specific exercises. Certain exercises can help fluid move out of the affected limb. Compression. Various methods may be used to apply pressure to the affected limb to reduce the swelling. They include: Wearing compression stockings or sleeves on the affected limb. Wrapping the affected limb with special bandages. Surgery. This is usually done for severe cases only. For example, surgery may be done if you have trouble moving the limb or if the swelling does not get better with other treatments. Follow these instructions at home: Self-care The affected area is more likely to become injured or infected. Take these steps to help prevent infection: Keep the affected area clean   and dry. Use approved creams or lotions to keep the skin moisturized. Protect your skin from cuts: Use gloves while cooking or gardening. Do not walk  barefoot. If you shave the affected area, use an electric razor. Do not wear tight clothes, shoes, or jewelry. Eat a healthy diet that includes a lot of fruits and vegetables. Activity Do exercises as told by your health care provider. Do not sit with your legs crossed. When possible, keep the affected limb raised (elevated) above the level of your heart. Avoid carrying things with an arm that is affected by lymphedema. General instructions Wear compression stockings or sleeves as told by your health care provider. Note any changes in size of the affected limb. You may be instructed to take regular measurements and keep track of them. Take over-the-counter and prescription medicines only as told by your health care provider. If you were prescribed an antibiotic medicine, take or apply it as told by your health care provider. Do not stop using the antibiotic even if you start to feel better or if your condition improves. Do not use heating pads or ice packs on the affected area. Avoid having blood draws, IV insertions, or blood pressure checks on the affected limb. Keep all follow-up visits. This is important. Contact a health care provider if you: Continue to have swelling in your limb. Have fluid leaking from the skin of your swollen limb. Have a cut that does not heal. Have redness or pain in the affected area. Develop purplish spots, rash, blisters, or sores (lesions) on your affected limb. Get help right away if you: Have new swelling in your limb that starts suddenly. Have shortness of breath or chest pain. Have a fever or chills. These symptoms may represent a serious problem that is an emergency. Do not wait to see if the symptoms will go away. Get medical help right away. Call your local emergency services (911 in the [place] Do not drive yourself to the hospital. Summary Lymphedema is swelling that is caused by the abnormal collection of lymph in the tissues under the  skin. Lymph is fluid from the tissues in your body that is removed through the lymphatic system. This system collects and carries excess fluid, fats, proteins, and wastes from the tissues of the body to the bloodstream. Lymphedema causes swelling, pain, and redness in the affected area. This may include the trunk area, or an arm or leg. Treatment for this condition may depend on the cause of your lymphedema. Treatment may include treating the underlying cause, complete decongestive therapy (CDT), compression methods, or surgery. This information is not intended to replace advice given to you by your health care provider. Make sure you discuss any questions you have with your health care provider. Document Revised: 06/28/2020 Document Reviewed: 06/28/2020 Elsevier Patient Education  2022 [employer] Inc.  

## 2021-10-30 NOTE — Assessment & Plan Note (Signed)
blood pressure control important in reducing the progression of atherosclerotic disease. On appropriate oral medications.  

## 2021-10-30 NOTE — Assessment & Plan Note (Signed)
lipid control important in reducing the progression of atherosclerotic disease. Continue statin therapy  

## 2021-10-30 NOTE — Assessment & Plan Note (Signed)
The patient has been on anticoagulation now for several months for right lower extremity DVT.  She will continue this.  She clearly has some postphlebitic symptoms and has developed marked lymphedema as well.  A venous reflux study will be done in the near future at her convenience.

## 2021-10-30 NOTE — Assessment & Plan Note (Signed)
blood glucose control important in reducing the progression of atherosclerotic disease. Also, involved in wound healing. On appropriate medications.  

## 2021-10-30 NOTE — Assessment & Plan Note (Signed)

## 2021-10-30 NOTE — Progress Notes (Signed)
Patient ID: Elizabeth Rowland, female   DOB: 01-Feb-1941, 81 y.o.   MRN: [id number]  Chief Complaint  Patient presents with   New Patient (Initial Visit)    New pt consult    HPI Elizabeth Rowland is a 81 y.o. female.  I am asked to see the patient by Nelson Chimes for evaluation of marked leg swelling and skin changes.  The patient's son provides pretty much the entirety of the history and she barely spoke.  It sounds like over the past several months, she has been in and out of the hospital with 2 bouts of COVID and been in nursing facilities for the last several months.  Her activity level has dropped dramatically.  She is now on continuous oxygen therapy.  She has tachycardia and poor cardiac and pulmonary function it would appear at her baseline.  Both lower extremities have progressively swollen and become more thickened and discolored.  There has been some blistering of the skin and dry cracking skin.  This has resulted in weeping intermittently but no weeping currently.  No fevers or chills.  Her son reports that she did have a blood clot in the right leg which is the more swollen of the 2 legs a few months ago.  She continues on anticoagulation for this.  She has not had any bleeding or issues with anticoagulation.    Past Medical History COPD Diabetes DVT Hyperlipidemia Hypertension   Family History No bleeding disorders, clotting disorders, autoimmune diseases, or aneurysms   Social History   Tobacco Use   Smoking status: Never   Smokeless tobacco: Never  Substance Use Topics   Alcohol use: Never   Drug use: Never     Not on File  Current Outpatient Medications  Medication Sig Dispense Refill   apixaban (ELIQUIS) 5 MG TABS tablet Take 1 tablet (5 mg total) by mouth 2 (two) times daily. 60 tablet 3   furosemide (LASIX) 40 MG tablet Take 1 tablet (40 mg total) by mouth daily. 30 tablet 11   gabapentin (NEURONTIN) 300 MG capsule Take 1 capsule by mouth 3 (three)  times daily.     insulin glargine (LANTUS) 100 UNIT/ML injection Inject 0.1 mLs (10 Units total) into the skin 2 (two) times daily. 10 mL 11   pravastatin (PRAVACHOL) 10 MG tablet Take 10 mg by mouth daily.     traMADol (ULTRAM) 50 MG tablet Take 50 mg by mouth every 4 (four) hours as needed.     docusate sodium (COLACE) 100 MG capsule Take 100 mg by mouth 2 (two) times daily. (Patient not taking: Reported on 10/30/2021)     guaiFENesin-dextromethorphan (ROBITUSSIN DM) 100-10 MG/5ML syrup Take 10 mLs by mouth every 4 (four) hours as needed for cough. (Patient not taking: Reported on 10/30/2021) 118 mL 0   insulin aspart (NOVOLOG) 100 UNIT/ML injection Inject 0-20 Units into the skin 3 (three) times daily with meals. (Patient not taking: Reported on 10/30/2021) 10 mL 11   insulin aspart (NOVOLOG) 100 UNIT/ML injection Inject 0-5 Units into the skin at bedtime. (Patient not taking: Reported on 10/30/2021) 10 mL 11   Multiple Vitamin (MULTIVITAMIN WITH MINERALS) TABS tablet Take 1 tablet by mouth daily. (Patient not taking: Reported on 10/30/2021) 30 tablet 3   nystatin (MYCOSTATIN) 100000 UNIT/ML suspension Take 5 mLs (500,000 Units total) by mouth 4 (four) times daily. X 10 days (Patient not taking: Reported on 10/30/2021) 60 mL 0   polyethylene glycol (MIRALAX / GLYCOLAX)  17 g packet Take 17 g by mouth daily as needed for mild constipation, moderate constipation or severe constipation. (Patient not taking: Reported on 10/30/2021)     potassium chloride SA (KLOR-CON) 20 MEQ tablet Take 1 tablet (20 mEq total) by mouth daily. (Patient not taking: Reported on 10/30/2021)  0   No current facility-administered medications for this visit.      REVIEW OF SYSTEMS (Negative unless checked)  Constitutional: [] Weight loss  [] Fever  [] Chills Cardiac: [] Chest pain   [] Chest pressure   [] Palpitations   [] Shortness of breath when laying flat   [] Shortness of breath at rest   [] Shortness of breath with  exertion. 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   [] Asthma Neurologic:  [] Dizziness  [] Blackouts   [] Seizures   [] History of stroke   [] History of TIA  [] Aphasia   [] Temporary blindness   [] Dysphagia   [] Weakness or numbness in arms   [] Weakness or numbness in legs Musculoskeletal:  [x] Arthritis   [] Joint swelling   [x] Joint pain   [] Low back pain Hematologic:  [] Easy bruising  [] Easy bleeding   [] Hypercoagulable state   [] Anemic  [] Hepatitis Gastrointestinal:  [] Blood in stool   [] Vomiting blood  [] Gastroesophageal reflux/heartburn   [] Abdominal pain Genitourinary:  [] Chronic kidney disease   [] Difficult urination  [] Frequent urination  [] Burning with urination   [] Hematuria Skin:  [] Rashes   [] Ulcers   [] Wounds Psychological:  [] History of anxiety   []  History of major depression.    Physical Exam BP (!) 151/80    Pulse (!) 120    Ht 5\' 7"  (1.702 m)    Wt 200 lb (90.7 kg)    LMP  (LMP Unknown)    SpO2 (!) 82% Comment: kept going up and down   BMI 31.32 kg/m  Gen:  debilitated, elderly WF Head: [place]/AT, No temporalis wasting.  Ear/Nose/Throat: Hearing grossly intact, nares w/o erythema or drainage, oropharynx w/o Erythema/Exudate Eyes: Conjunctiva clear, sclera non-icteric  Neck: trachea midline.  No JVD.  Pulmonary: On supplemental oxygen with somewhat labored respirations at rest and diminished lung sounds Cardiac: Tachycardic and irregular Vascular:  Vessel Right Left  Radial Palpable Palpable                          DP Not palpable Not palpable  PT Not palpable Not palpable   Gastrointestinal:. No masses, surgical incisions, or  scars. Musculoskeletal: M/S 5/5 throughout.  Extremities without ischemic changes.  Walks with a walker.  3+ right lower extremity edema, 2-3+ left lower extremity edema.  Marked stasis dermatitis changes present bilaterally Neurologic: Sensation grossly intact in extremities.  Symmetrical.   Psychiatric: Patient is a poor historian and the son provides the entirety of the history.  Difficult to assess. Dermatologic: No rashes or ulcers noted.  No cellulitis or open wounds.    Radiology No results found.  Labs No results found for this or any previous visit (from the past 2160 hour(s)).  Assessment/Plan:  Right leg DVT [facility]) The patient has  been on anticoagulation now for several months for right lower extremity DVT.  She will continue this.  She clearly has some postphlebitic symptoms and has developed marked lymphedema as well.  A venous reflux study will be done in the near future at her convenience.  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  On oxygen therapy.  Essential hypertension blood pressure control important in reducing the progression of atherosclerotic disease. On appropriate oral medications.   Hyperlipidemia lipid control important in reducing the progression of atherosclerotic disease. Continue statin therapy   Type 2 diabetes mellitus with hyperlipidemia (HCC) blood glucose control important in reducing the progression of atherosclerotic disease. Also, involved in wound healing. On appropriate medications.   Swelling of limb I have had a long discussion with the patient regarding swelling and why it  causes symptoms.  Patient will begin wearing graduated compression stockings class 1 (20-30 mmHg) on a daily basis a prescription was given. The patient will  beginning wearing the stockings first thing in the morning and removing them in the evening. The patient is instructed specifically not to sleep in the stockings.   In addition,  behavioral modification will be initiated.  This will include frequent elevation, use of over the counter pain medications and exercise such as walking.  I have reviewed systemic causes for chronic edema such as liver, kidney and cardiac etiologies.  The patient denies problems with these organ systems.    Consideration for a lymph pump will also be made based upon the effectiveness of conservative therapy.  This would help to improve the edema control and prevent sequela such as ulcers and infections   Patient should undergo duplex ultrasound of the venous system to ensure that DVT or reflux is not present.  The patient will follow-up with me after the ultrasound.       Leotis Pain 10/30/2021, 12:26 PM   This note was created with Dragon medical transcription system.  Any errors from dictation are unintentional.

## 2021-10-30 NOTE — Assessment & Plan Note (Signed)
On anticoagulation.  Chronic at this point.  On oxygen therapy.

## 2021-11-09 ENCOUNTER — Other Ambulatory Visit
Admission: RE | Admit: 2021-11-09 | Discharge: 2021-11-09 | Disposition: A | Discharge status: Home / Self Care | Payer: Medicare Other | Source: Ambulatory Visit | Attending: Pulmonary Disease | Admitting: Pulmonary Disease

## 2021-11-09 DIAGNOSIS — R0602 Shortness of breath: Secondary | ICD-10-CM | POA: Diagnosis present

## 2021-11-09 LAB — D-DIMER, QUANTITATIVE: D-Dimer, Quant: 0.61 ug/mL-FEU — ABNORMAL HIGH (ref 0.00–0.50)

## 2021-11-28 ENCOUNTER — Other Ambulatory Visit: Payer: Self-pay | Admitting: Pulmonary Disease

## 2021-11-28 DIAGNOSIS — I2699 Other pulmonary embolism without acute cor pulmonale: Secondary | ICD-10-CM

## 2021-11-29 ENCOUNTER — Ambulatory Visit
Admission: RE | Admit: 2021-11-29 | Discharge: 2021-11-29 | Disposition: A | Discharge status: Home / Self Care | Payer: Medicare Other | Source: Ambulatory Visit | Attending: Pulmonary Disease | Admitting: Pulmonary Disease

## 2021-11-29 ENCOUNTER — Other Ambulatory Visit: Payer: Self-pay

## 2021-11-29 ENCOUNTER — Telehealth (INDEPENDENT_AMBULATORY_CARE_PROVIDER_SITE_OTHER): Payer: Self-pay | Admitting: Vascular Surgery

## 2021-11-29 DIAGNOSIS — I2699 Other pulmonary embolism without acute cor pulmonale: Secondary | ICD-10-CM | POA: Diagnosis not present

## 2021-11-29 HISTORY — DX: Type 2 diabetes mellitus without complications: E11.9

## 2021-11-29 LAB — POCT I-STAT CREATININE: Creatinine, Ser: 0.9 mg/dL (ref 0.44–1.00)

## 2021-11-29 MED ORDER — IOHEXOL 350 MG/ML SOLN
75.0000 mL | Freq: Once | INTRAVENOUS | Status: AC | PRN
  Administered 2021-11-29: 75 mL via INTRAVENOUS

## 2021-11-29 NOTE — Telephone Encounter (Signed)
Spoke with the patient and let her know that going to a medical supply store like [facility] to be fitted may be an option although it may be more expensive. Patient stated she would let her son know. I did call to speak with the son and the mother answered. ?

## 2021-11-29 NOTE — Telephone Encounter (Signed)
Pt. Son stated he call every place the Dr. Wyn Quaker gave him for some compression socks and no one has any big enough to fit her leg.  He also stated that her leg went down and looks a lot better.  Please advise. ?

## 2021-12-21 ENCOUNTER — Ambulatory Visit (INDEPENDENT_AMBULATORY_CARE_PROVIDER_SITE_OTHER): Payer: Medicare Other | Admitting: Vascular Surgery

## 2021-12-21 ENCOUNTER — Encounter (INDEPENDENT_AMBULATORY_CARE_PROVIDER_SITE_OTHER): Payer: Medicare Other

## 2022-01-11 ENCOUNTER — Encounter (INDEPENDENT_AMBULATORY_CARE_PROVIDER_SITE_OTHER): Payer: Medicare Other

## 2022-01-11 ENCOUNTER — Ambulatory Visit (INDEPENDENT_AMBULATORY_CARE_PROVIDER_SITE_OTHER): Payer: Medicare Other | Admitting: Vascular Surgery

## 2022-04-12 IMAGING — CT CT ANGIO CHEST
2 of 7 series · 17 of 46 positions shown · IV contrast (APPLIED)
Comparison: Chest CTA 01/06/2021 and left lower extremity Doppler
ultrasound 01/06/2021. Chest radiographs 01/28/2021, 01/08/2021 and
07/06/2014.

CLINICAL DATA: Shortness of breath. On home oxygen. History of
pulmonary embolism.

EXAM:
CT ANGIOGRAPHY CHEST WITH CONTRAST
TECHNIQUE: Multidetector CT imaging of the chest was performed using the
standard protocol during bolus administration of intravenous
contrast. Multiplanar CT image reconstructions and MIPs were
obtained to evaluate the vascular anatomy.

[Series 5: thins · axial · 0.69mm/px · z∈[-192,+29]mm · 14 of 309 slices shown]
[im 16/309  lung]
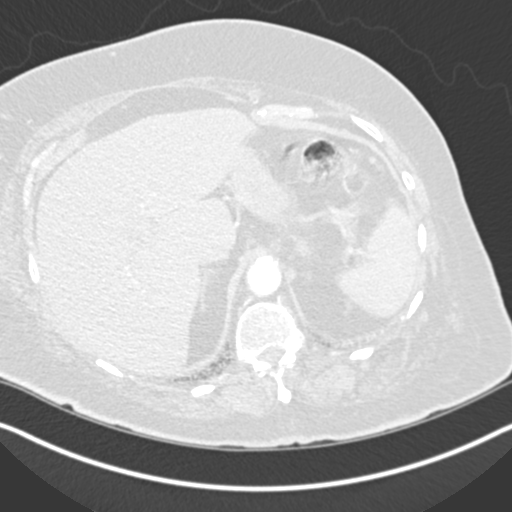
[im 47/309  soft-tissue]
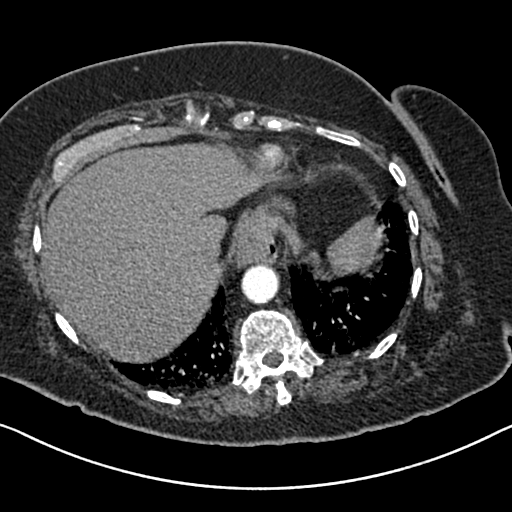
[im 62/309  lung]
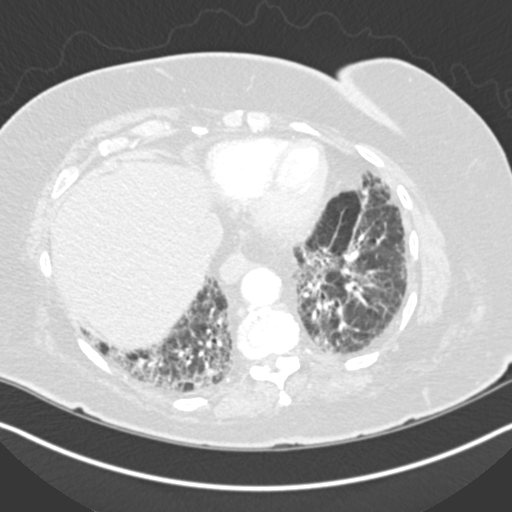
[im 78/309  soft-tissue]
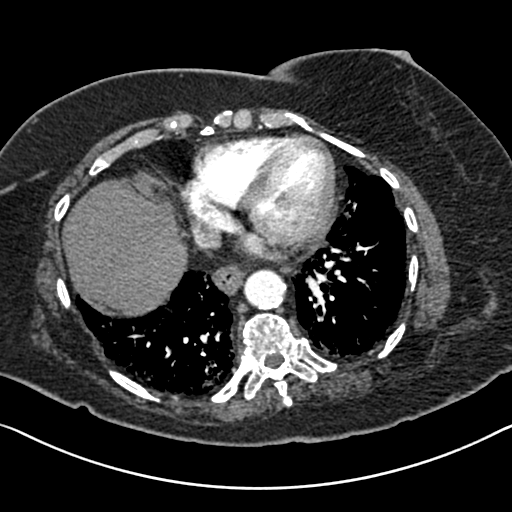
[im 108/309  lung]
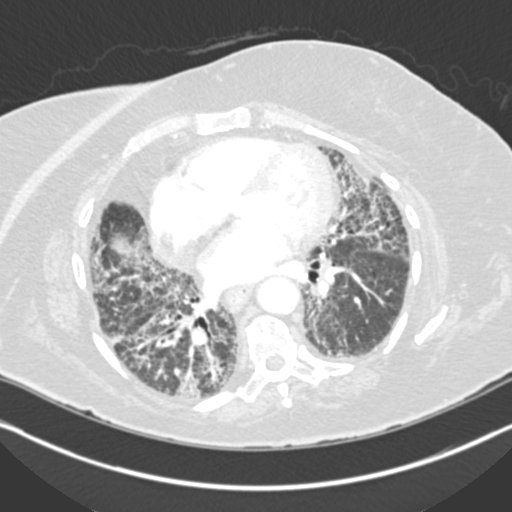
[im 124/309  soft-tissue]
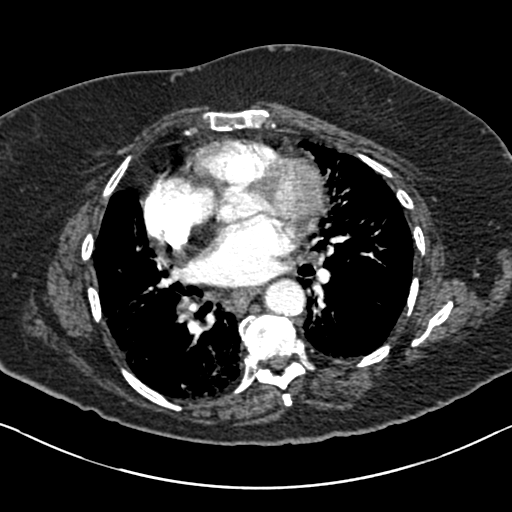
[im 139/309  lung]
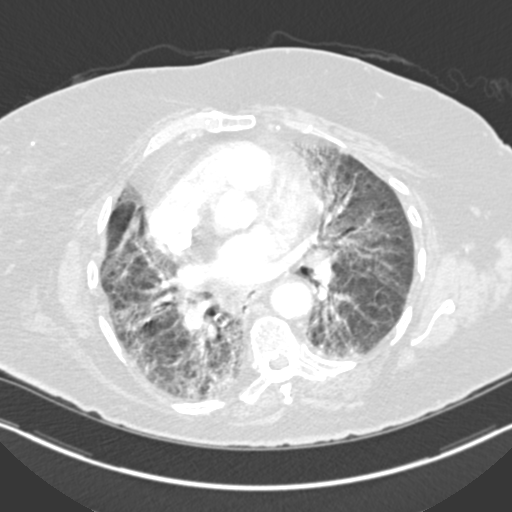
[im 170/309  soft-tissue]
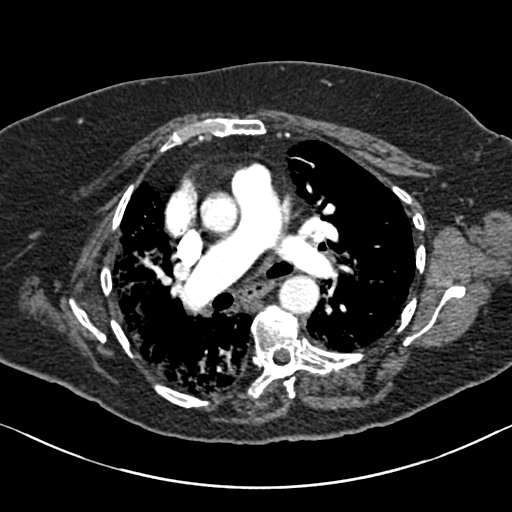
[im 185/309  lung]
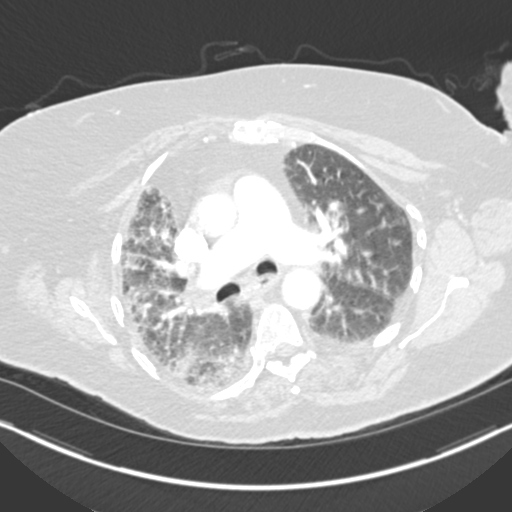
[im 201/309  soft-tissue]
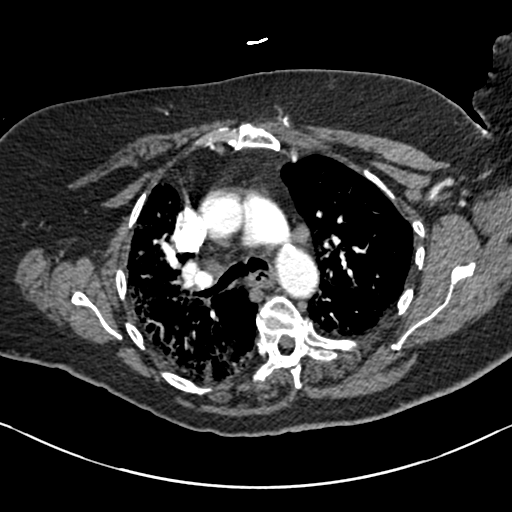
[im 232/309  lung]
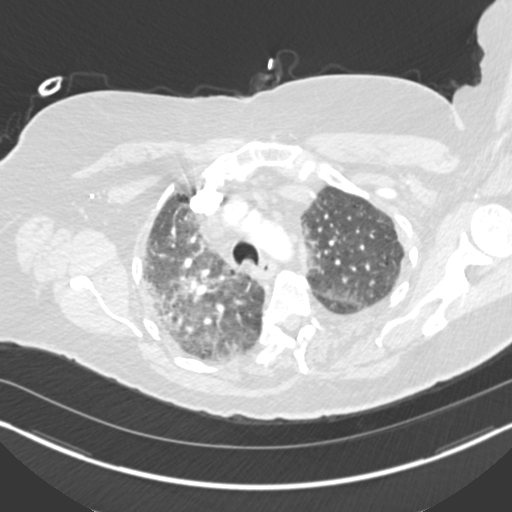
[im 247/309  soft-tissue]
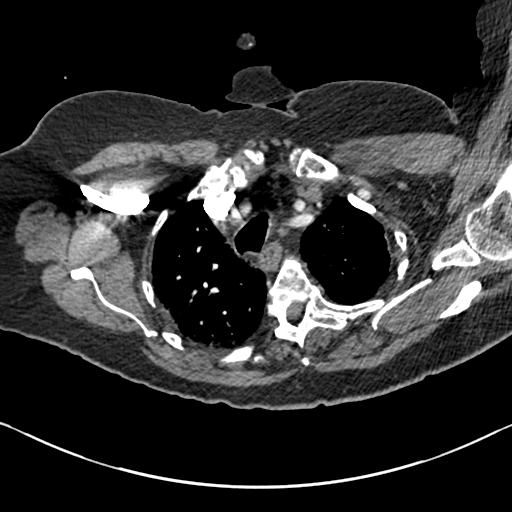
[im 262/309  lung]
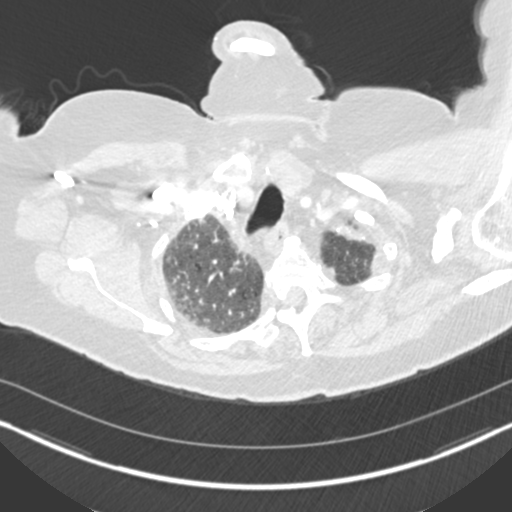
[im 293/309  soft-tissue]
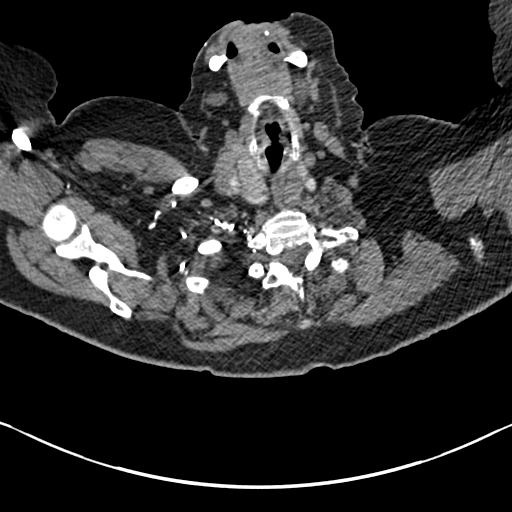

[Series 7: coronal mpr · coronal · 0.50mm/px · 3 of 90 slices shown]
[im 23/90  soft-tissue]
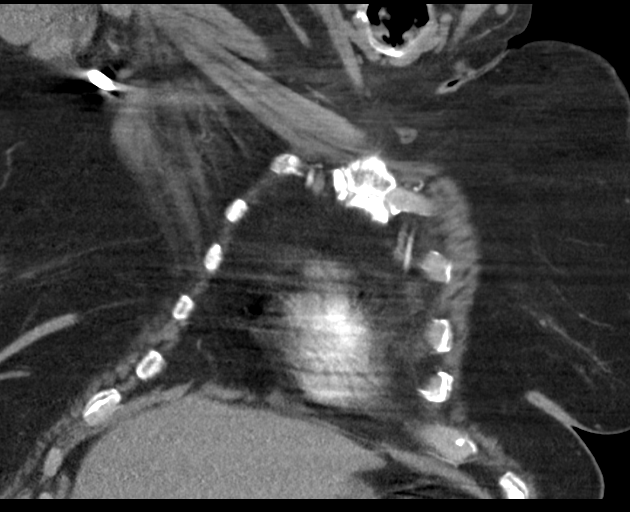
[im 45/90  soft-tissue]
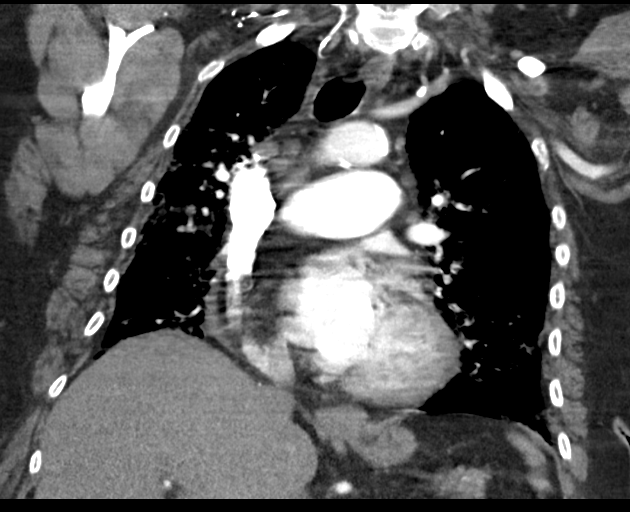
[im 67/90  soft-tissue]
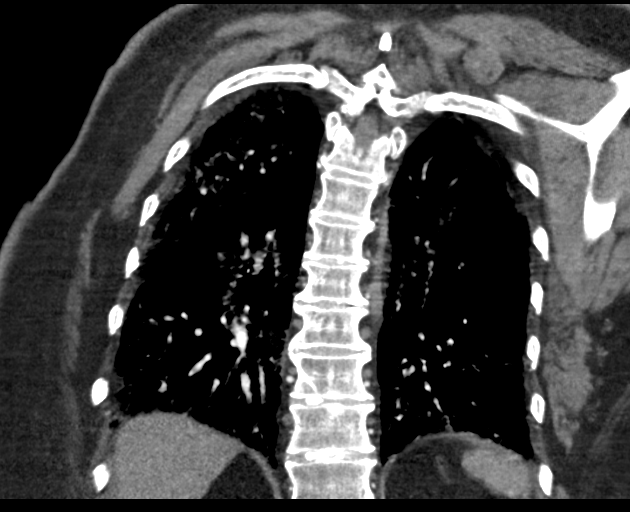

[17 of 46 positions shown; findings below may reference images not displayed]

RADIATION DOSE REDUCTION: This exam was performed according to the
departmental dose-optimization program which includes automated
exposure control, adjustment of the mA and/or kV according to
patient size and/or use of iterative reconstruction technique.

CONTRAST:  75mL OMNIPAQUE IOHEXOL 350 MG/ML SOLN
FINDINGS: Cardiovascular: The pulmonary arteries are well opacified with
contrast to the level of the subsegmental branches. There is no
evidence of acute pulmonary embolism. Stable mild central
enlargement of the pulmonary arteries. There is atherosclerosis of
the aorta, great vessels and coronary arteries. The heart size is
normal. There is no pericardial effusion.

Mediastinum/Nodes: Multiple small mediastinal and hilar lymph nodes
bilaterally are unchanged, likely reactive. No axillary adenopathy.
There is mild distal esophageal wall thickening associated with a
small hiatal hernia. Unchanged 2.9 x 1.4 cm left thyroid nodule on
image [DATE].

Lungs/Pleura: There is no pleural effusion or pneumothorax. The
extensive ground-glass opacities seen on the prior study have
significantly improved, although there are residual increased
ground-glass and interstitial opacities bilaterally, right greater
than left. Subpleural reticulation is present at both lung bases
without definite honeycomb formation or bronchiectasis. No
suspicious pulmonary nodularity.

Upper abdomen: The visualized upper abdomen appears stable without
suspicious findings.

Musculoskeletal/Chest wall: There is no chest wall mass or
suspicious osseous finding. Multilevel thoracic spondylosis with
prominent posterior osteophytes at T7-8.

Review of the MIP images confirms the above findings.
IMPRESSION: 1. No evidence of recurrent acute pulmonary embolism or other acute
vascular findings in the chest.
2. Compared with previous CT of 11 months ago, there are improved
but persistent ground-glass and interstitial opacities in both lungs
suspicious for fibrosis with superimposed edema or atypical
infection previously. On today's CT, the lungs may be slightly worse
than on most recent radiographs of 01/28/2021. Continued
radiographic follow-up recommended. Patient may benefit from
high-resolution chest CT follow-up.
3. 2.9 cm left thyroid nodule again noted. If not previously
performed, recommend thyroid ultrasound. (Ref: [HOSPITAL].
[DATE]): 143-50).
4. Coronary and Aortic Atherosclerosis (METD9-M8S.S).

## 2022-05-24 ENCOUNTER — Ambulatory Visit (INDEPENDENT_AMBULATORY_CARE_PROVIDER_SITE_OTHER): Payer: Medicare Other | Admitting: Vascular Surgery

## 2022-05-28 ENCOUNTER — Encounter (INDEPENDENT_AMBULATORY_CARE_PROVIDER_SITE_OTHER): Payer: Self-pay | Admitting: Vascular Surgery

## 2022-05-28 ENCOUNTER — Ambulatory Visit (INDEPENDENT_AMBULATORY_CARE_PROVIDER_SITE_OTHER): Payer: Medicare Other | Admitting: Vascular Surgery

## 2022-05-28 VITALS — BP 157/74 | HR 96 | Resp 16 | Wt 231.6 lb

## 2022-05-28 DIAGNOSIS — I824Y1 Acute embolism and thrombosis of unspecified deep veins of right proximal lower extremity: Secondary | ICD-10-CM

## 2022-05-28 DIAGNOSIS — I10 Essential (primary) hypertension: Secondary | ICD-10-CM | POA: Diagnosis not present

## 2022-05-28 DIAGNOSIS — E1169 Type 2 diabetes mellitus with other specified complication: Secondary | ICD-10-CM | POA: Diagnosis not present

## 2022-05-28 DIAGNOSIS — M7989 Other specified soft tissue disorders: Secondary | ICD-10-CM

## 2022-05-28 DIAGNOSIS — E785 Hyperlipidemia, unspecified: Secondary | ICD-10-CM

## 2022-05-28 DIAGNOSIS — E782 Mixed hyperlipidemia: Secondary | ICD-10-CM

## 2022-05-28 DIAGNOSIS — I2694 Multiple subsegmental pulmonary emboli without acute cor pulmonale: Secondary | ICD-10-CM

## 2022-05-28 DIAGNOSIS — I890 Lymphedema, not elsewhere classified: Secondary | ICD-10-CM

## 2022-05-28 NOTE — Progress Notes (Signed)
Patient ID: Elizabeth Rowland, female   DOB: 07/16/1941, 81 y.o.   MRN: [id number]  Chief Complaint  Patient presents with   Follow-up    Ref [facility] consult lle cellulitis,lymphedema,venous stasis    HPI Elizabeth Rowland is a 81 y.o. female.  I am asked to see the patient by Dr. Sampson Goon for evaluation of her severe lymphedema of both lower extremities.  Patient was seen earlier this year and prescribed compression socks which they were unable to obtain that would fit her.  Her leg swelling has become progressively worse.  Dr. Sampson Goon had her and what sounds like [place]na boots 4 weeks and she came out about a month ago.  She has severe dermal thickening bilaterally has had recurrent cellulitic episodes.  She has a previous history of DVT and there is a known postphlebitic component to this as well.  The patient has severe cardiopulmonary disease and is on chronic oxygen and very mobile at this point.  Her legs are dependent much of the time.  There is no current skin weeping or open wounds but her skin is very thickened and swollen.  Both legs are quite swollen and pronounced.  They are heavy and tired.  No fevers or chills.  She remains on anticoagulation.  She is also had 2 COVID infections that have been very debilitating for her as well.     Past Medical History:  Diagnosis Date   Diabetes mellitus without complication (HCC)   Covid 19 infections Hyperlipidemia DVT PE  Past Surgical History:  Procedure Laterality Date   NO PAST SURGERIES       Family History No bleeding disorders, clotting disorders, autoimmune diseases, or aneurysms   Social History   Tobacco Use   Smoking status: Never   Smokeless tobacco: Never  Substance Use Topics   Alcohol use: Never   Drug use: Never     Not on File  Current Outpatient Medications  Medication Sig Dispense Refill   apixaban (ELIQUIS) 5 MG TABS tablet Take 1 tablet (5 mg total) by mouth 2 (two) times daily. 60  tablet 3   gabapentin (NEURONTIN) 300 MG capsule Take 1 capsule by mouth 3 (three) times daily.     insulin glargine (LANTUS) 100 UNIT/ML injection Inject 0.1 mLs (10 Units total) into the skin 2 (two) times daily. 10 mL 11   potassium chloride SA (KLOR-CON) 20 MEQ tablet Take 1 tablet (20 mEq total) by mouth daily.  0   pravastatin (PRAVACHOL) 10 MG tablet Take 10 mg by mouth daily.     traMADol (ULTRAM) 50 MG tablet Take 50 mg by mouth every 4 (four) hours as needed.     docusate sodium (COLACE) 100 MG capsule Take 100 mg by mouth 2 (two) times daily. (Patient not taking: Reported on 10/30/2021)     furosemide (LASIX) 40 MG tablet Take 1 tablet (40 mg total) by mouth daily. 30 tablet 11   guaiFENesin-dextromethorphan (ROBITUSSIN DM) 100-10 MG/5ML syrup Take 10 mLs by mouth every 4 (four) hours as needed for cough. (Patient not taking: Reported on 10/30/2021) 118 mL 0   insulin aspart (NOVOLOG) 100 UNIT/ML injection Inject 0-20 Units into the skin 3 (three) times daily with meals. (Patient not taking: Reported on 10/30/2021) 10 mL 11   insulin aspart (NOVOLOG) 100 UNIT/ML injection Inject 0-5 Units into the skin at bedtime. (Patient not taking: Reported on 10/30/2021) 10 mL 11   Multiple Vitamin (MULTIVITAMIN WITH MINERALS) TABS tablet Take 1  tablet by mouth daily. (Patient not taking: Reported on 10/30/2021) 30 tablet 3   nystatin (MYCOSTATIN) 100000 UNIT/ML suspension Take 5 mLs (500,000 Units total) by mouth 4 (four) times daily. X 10 days (Patient not taking: Reported on 10/30/2021) 60 mL 0   polyethylene glycol (MIRALAX / GLYCOLAX) 17 g packet Take 17 g by mouth daily as needed for mild constipation, moderate constipation or severe constipation. (Patient not taking: Reported on 10/30/2021)     No current facility-administered medications for this visit.     REVIEW OF SYSTEMS (Negative unless checked)   Constitutional: [] Weight loss  [] Fever  [] Chills Cardiac: [] Chest pain   [] Chest pressure    [] Palpitations   [] Shortness of breath when laying flat   [] Shortness of breath at rest   [] Shortness of breath with exertion. 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   [] Asthma Neurologic:  [] Dizziness  [] Blackouts   [] Seizures   [] History of stroke   [] History of TIA  [] Aphasia   [] Temporary blindness   [] Dysphagia   [] Weakness or numbness in arms   [] Weakness or numbness in legs Musculoskeletal:  [x] Arthritis   [] Joint swelling   [x] Joint pain   [] Low back pain Hematologic:  [] Easy bruising  [] Easy bleeding   [] Hypercoagulable state   [] Anemic  [] Hepatitis Gastrointestinal:  [] Blood in stool   [] Vomiting blood  [] Gastroesophageal reflux/heartburn   [] Abdominal pain Genitourinary:  [] Chronic kidney disease   [] Difficult urination  [] Frequent urination  [] Burning with urination   [] Hematuria Skin:  [] Rashes   [] Ulcers   [] Wounds Psychological:  [] History of anxiety   []  History of major depression.    Physical Exam BP (!) 157/74 (BP Location: Right Arm)   Pulse 96   Resp 16   Wt 231 lb 9.6 oz (105.1 kg)   LMP  (LMP Unknown)   BMI 36.27 kg/m  Gen:  WD/WN, NAD but debilitated appearing Head: [place]/AT, No temporalis wasting.  Ear/Nose/Throat: Hearing grossly intact, nares w/o erythema or drainage, oropharynx w/o Erythema/Exudate Eyes: Conjunctiva clear, sclera non-icteric  Neck: trachea midline.  No JVD.  Pulmonary:  Good air movement, respirations not labored, on supplemental oxygen Cardiac: Tachycardic and somewhat irregular Vascular:  Vessel Right Left  Radial Palpable Palpable                          DP NP NP  PT the NP NP    Gastrointestinal:. No masses, surgical incisions, or scars. Musculoskeletal: M/S 5/5 throughout.  Extremities without ischemic changes.  No deformity or atrophy.  Marked fibrosis and dermal thickening bilaterally with severe stasis dermatitis changes.  3+ nonpitting bilateral lower extremity edema. Neurologic: Sensation grossly intact in extremities.  Symmetrical.  Speech is fluent. Motor exam as listed above. Psychiatric: Judgment intact, Mood & affect appropriate for pt's clinical situation. Dermatologic: Skin thickening of both lower extremities as above    Radiology No results found.  Labs No results found for this or any previous visit (from the past 2160 hour(s)).  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  On oxygen therapy.   Essential hypertension blood pressure control important in reducing the progression of atherosclerotic disease. On appropriate oral medications.     Hyperlipidemia lipid control important in reducing the progression of atherosclerotic disease. Continue statin therapy     Type 2 diabetes mellitus with hyperlipidemia (HCC) blood glucose control important in reducing the progression of atherosclerotic disease. Also, involved in wound healing. On appropriate medications.  Swelling of limb Significant and poorly controlled  Lymphedema The patient has stage III lymphedema with marked skin hardening and nonpitting edema with progressive fibrosis of the skin.  She has had poorly controlled swelling for many months.  She did not return in follow-up after her visit earlier this year and has not gotten her venous reflux study which we can still do.  There is likely a significant postphlebitic component with her previous DVT.  Her severe medical comorbidities with oxygen dependent lung disease and poor cardiopulmonary performance are likely major contributing factors as well.  Her immobility is markedly worsening the  situation.  I have given them a prescription for the juxta lite compression system to use some sort of compression garment more regularly.  They have not been able to get compression socks on and off.  I have put in a referral for the lymphedema clinic as I think she would benefit from manual massage at their expertise.  I have also recommended a lymphedema pump that she can use at home nightly.  We will see her back after her ultrasound in the near future at her convenience.      Festus Barren 05/28/2022, 12:25 PM   This note was created with Dragon medical transcription system.  Any errors from dictation are unintentional.

## 2022-05-28 NOTE — Assessment & Plan Note (Signed)
The patient has stage III lymphedema with marked skin hardening and nonpitting edema with progressive fibrosis of the skin.  She has had poorly controlled swelling for many months.  She did not return in follow-up after her visit earlier this year and has not gotten her venous reflux study which we can still do.  There is likely a significant postphlebitic component with her previous DVT.  Her severe medical comorbidities with oxygen dependent lung disease and poor cardiopulmonary performance are likely major contributing factors as well.  Her immobility is markedly worsening the situation.  I have given them a prescription for the juxta lite compression system to use some sort of compression garment more regularly.  They have not been able to get compression socks on and off.  I have put in a referral for the lymphedema clinic as I think she would benefit from manual massage at their expertise.  I have also recommended a lymphedema pump that she can use at home nightly.  We will see her back after her ultrasound in the near future at her convenience.

## 2022-05-28 NOTE — Assessment & Plan Note (Signed)
Significant and poorly controlled

## 2022-06-25 ENCOUNTER — Ambulatory Visit: Payer: Medicare Other | Attending: Vascular Surgery | Admitting: Occupational Therapy

## 2022-06-25 DIAGNOSIS — I890 Lymphedema, not elsewhere classified: Secondary | ICD-10-CM | POA: Insufficient documentation

## 2022-06-26 ENCOUNTER — Encounter: Payer: Self-pay | Admitting: Occupational Therapy

## 2022-06-26 NOTE — Therapy (Signed)
OUTPATIENT OCCUPATIONAL THERAPY EVALUATION: BILATERAL LOWER EXTREMITY LYMPHEDEMA   Patient Name: Elizabeth Rowland MRN: [id number] DOB:1941/01/12, 81 y.o., female Today's Date: 06/26/2022    Past Medical History:  Diagnosis Date   Diabetes mellitus without complication ([place])    Past Surgical History:  Procedure Laterality Date   NO PAST SURGERIES     Patient Active Problem List   Diagnosis Date Noted   Lymphedema 05/28/2022   Swelling of limb 10/30/2021   Type 2 diabetes mellitus with hyperlipidemia ([place]) 02/01/2021   Pneumonia due to COVID-19 virus 01/28/2021   Hypomagnesemia 01/28/2021   Hypokalemia 01/28/2021   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[place]) 01/28/2021   Community acquired pneumonia    Lung mass    Acute respiratory failure with hypoxia ([place]) 01/07/2021   Acute respiratory distress syndrome (ARDS) due to COVID-19 virus ([place]) 01/07/2021   Pulmonary emboli ([place]) 01/07/2021   Severe sepsis with acute organ dysfunction ([place]) 01/06/2021   Hyperlipidemia 01/06/2021   Fibromyalgia 01/06/2021   Essential hypertension 01/06/2021    PCP: Marguerita Merles, MD  REFERRING PROVIDER: Algernon Huxley, MD  REFERRING DIAG: I89.0  THERAPY DIAG:  Lymphedema, not elsewhere classified  Rationale for Evaluation and Treatment Rehabilitation  ONSET DATE: 06/25/22  SUBJECTIVE                                                                                                                                                                                           SUBJECTIVE STATEMENT:   PERTINENT HISTORY:  Hx recurrent cellulitis , Urinary incontinence, Severe BLE venous stasis, pulmonary fibrosis' on 02,  Hyperlipidemia Fibromyalgia Essential hypertension 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HCC) Severe sepsis with acute organ dysfunction ([place]) Acute respiratory failure with hypoxia ([place]) Acute respiratory distress syndrome (ARDS) due to COVID-19 virus ([place]) Pulmonary emboli ([place]) Community acquired  pneumonia Lung mass Pneumonia due to COVID-19 virus Hypomagnesemia Hypokalemia Type 2 diabetes mellitus with hyperlipidemia (HCC) Swelling of limb Lymphedema  PAIN:  Are you having pain? Yes: NPRS scale: not rated numerically/10 Pain location: legs Pain description: tight,  Heavy , squeezing, burning Aggravating factors standing, dependent sitting Relieving factors: elevation   IMPAIRMENTS: difficulty walking, impaired transfers, increase risk of falls and recurrent infections, impaired endurance, decreased balance, generalized weakness, impaired cognition,   FUNCTIONAL LIMITATIONS: limited functional ambulation and mobility,increase risk of falls and recurrent infections, impaired basic and instrumental ADLs performance (needs assistance with all basic and instrumental ADLs) , impaired performance of productive activities and leisure pursuits, limited social participation  PRECAUTIONS: Fall and Other: 02, Lymphedema Precautions: PULMONARY , DIABETES  WEIGHT BEARING RESTRICTIONS No  FALLS:  Has patient fallen in last 6 months?  Unknown  LIVING ENVIRONMENT: Lives with: lives with their son Lives in: House/apartment Has following equipment at home: Wheelchair (manual)  OCCUPATION: Retired  LEISURE: sedentary  HAND DOMINANCE : R   PRIOR LEVEL OF FUNCTION: Needs assistance with ADLs, Needs assistance with homemaking, Needs assistance with gait, Needs assistance with transfers, and Vocation/Vocational requirements:    PATIENT GOALS reduce swelling and limit progression per son, [name]   OBJECTIVE  COGNITION:  Overall cognitive status: Difficulty to assess due to: limited participation in interview    OBSERVATIONS / OTHER ASSESSMENTS:   Skin  Description Hyper-Keratosis Peau' de Orange Shiny Tight Fibrotic/ Indurated Fatty Doughy Spongy/ boggy   x   x Dense, rigid  indurated skin peppered with oozing lymphatic cysts.  Skin is thickened   thickening, hard and reddened  with cobblestone-like texture. Skin excursion (essential for MLD and AROM) below the knees is poor.       Skin dry Flaky WNL Macerated   X Crusty, flaking  x    Color Redness Varicosities Blanching Hemosiderin Stain Mottled   x x Discolored yellowish-brown, skin. Red, scaly w thick patches. Tender/painful areas  Difficult to assess x   Odor Malodorous Yeast Fungal infection  WNL      x   Temperature Warm Cool wnl    x     Pitting Edema   1+ 2+ 3+ 4+ Non-pitting         x   Girth Symmetrical Asymmetrical                   Distribution    R>L     Stemmer Sign Positive Negative   Strong positives bilaterally    Lymphorrhea History Of:  Present Absent   x x     Wounds History Of Present Absent Venous Arterial Pressure Sheer     x        Signs of Infection Redness Warmth Erythema Acute Swelling Drainage Borders                    Sensation Light Touch Deep pressure Hypersensitivty   Present Impaired Present Impaired Absent Impaired   x  x  x     Nails WNL   Fungus nail dystrophy    x x   Hair Growth Symmetrical Asymmetrical       Skin Creases Base of toes  Ankles   Base of Fingers knees       Abdominal pannus Thigh Lobules  Face/neck   x x         POSTURE: head forward , shoulders rounded , cervical kyphosis  LE ROM: knee and ankle joint AROM moderately limited by skin tightness and girth (skin approximation)  MUSCLE STRENGTH: generalized debility  LYMPHEDEMA ASSESSMENTS: Non-cancer related  FOTO OUTCOME SCORE: Intake 06/25/22 39/100  COMPARATIVE LIMB VOLUMETRICS: tba  OT Rx visit 1  LANDMARK RIGHT  //  R LEG (A-D) N/A  R THIGH (E-G) ml  R FULL LIMB (A-G) ml  Limb Volume differential (LVD)  %  Volume change since initial %  Volume change overall V  (Blank rows = not tested)  LANDMARK LEFT   R LEG (A-D) N/A  R THIGH (E-G) ml  R FULL LIMB (A-G) ml  Limb Volume differential (LVD)  %  Volume change since initial %  Volume change  overall %  (Blank rows = not tested)  INFECTIONS: Recurrent cellulities  GAIT: Distance walked: 2 feet Assistive device utilized: Max physical assist  from son to complete stand pivot transfer  by report Level of assistance: Max A Comments: Pt does not bring walker to clinic. Pt not wearing street shoes, but bedroom slippers instead. These are NOT non slip and increase fall risk. Urged son to fit Pt with velcro style "cast shoe" for lymphedema care.  TODAY'S TREATMENT  Occupational Therapy Lymphedema Evaluation Pt and family education for chronic, progressive, incurrable Lymphedema, including etiology, progression, treatment and long term self management with support. Discussed contribution of co-morbidities, including CVI, and lifestyle contributions. Provided Lymphedema Workbook for reference.  PATIENT EDUCATION:  Education details: See above under Today's Treatment. Person educated: Patient and Child(ren) Education method: Explanation, Demonstration, Tactile cues, Verbal cues, and Handouts Education comprehension: verbalized understanding, returned demonstration, and needs further education   lYMPHEDEMA SELF CARE HOME PROGRAM: Multilayer gradient compression wraps from base of toes to below the knee  23/7.  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 Dependent sitting most of the day and evening significantly contributes to venous and lymph stasis. Below the knees bilaterally skin is dry, cracked and flaking. Tissue is densely fibrotic and indurated with minimal excursion essential for lymphatic function. Pt is unable to reach her feet for more than a few seconds to  inspect skin, perform skin care, bathe skin and don lower body clothing and shoes. Broken skin and urinary incontinence contributes to recurrent bacterial cellulitis, further advancing lymphedema over time.  Lymphedema-related skin changes are irreversible at this stage, but Pt will benefit from  OT for Intensive and Self Management Phases of CDT to limit progression and risk of already recurrent infections.   Phlebo-lymphedema impacts Elizabeth Rowland's life in all occupational domains, including functional ambulation and transfers, basic and instrumental ADLs, productive activities, leisure pursuits and social participation and quality of life. Lymphedema management has a high burden of self-care and a high burden of caregiver involvement. Maximum caregiver assistance is essential in this case to complete intensive and self-management phases of Complete Decongestive Therapy (CDT),  Because Pt is unable to reach her feet she is unable to apply compression wraps or compression garments, to perform skin and nail care, to perform daily skin inspection, and to perform simple self-MLD. Caregiver verbalized understanding than without maximum assistance with all lymphedema home program components her prognosis is poor and she will not retain clinical gains. He verbalized understanding that he needs to accompany his mom to treatment sessions to assist her with self-care PRN.   With consistent, diligent and ongoing caregiver assistance throughout treatment Pt's prognosis for improvement is fair-good. Without ongoing assistance with daily lymphedema self-management, Pt will not retain clinical gains , lymphedema will progress and Pt will experience further functional decline.   OBJECTIVE IMPAIRMENTS Abnormal gait, cardiopulmonary status limiting activity, decreased activity tolerance, decreased balance, decreased cognition, decreased endurance, decreased knowledge of condition, decreased knowledge of use of DME, decreased  mobility, difficulty walking, decreased ROM, decreased strength, increased edema, impaired flexibility, postural dysfunction, obesity, and pain.   ACTIVITY LIMITATIONS standing, stairs, transfers, bed mobility, continence, bathing, toileting, dressing, and hygiene/grooming  PARTICIPATION LIMITATIONS: meal prep, cleaning, laundry, medication management, driving, shopping, community activity, occupation, yard work, school, and church  PERSONAL FACTORS Age, Past/current experiences, Time since  onset of injury/illness/exacerbation, 1-2 comorbidities: Level of dependence on others for ADLs , including self care also affect patient's functional outcome.   REHAB POTENTIAL: Fair fair with daily consistent Max assistance with all lymphedema self-care home program components- during Intensive Phase CDT and ongoing.   EVALUATION COMPLEXITY: High   GOALS: Goals reviewed with patient/ caregiver? Yes  SHORT TERM GOALS: Target date: 07/10/2022    Pt will verbalize understanding of lymphedema precautions and prevention strategies and signs and symptoms of cellulitis infection with maximum assistance from caregiver  (using printed reference to reduce risk of progression and to limit infection risk. Baseline:Dependent  Goal status: INITIAL  2.  With max caregiver assistance Pt will be able to don and doff multilayer, knee length, compression wraps using gradient techniques from base of toes to popliteal fossa ( one limb at a time) to decrease limb volume, to limit infection risk, and to limit lymphedema progression.  Baseline: Dependent Goal status: INITIAL   LONG TERM GOALS: Target date: 09/18/2022   Given this patient's Intake score of 39/100% on the functional outcomes FOTO tool, patient will experience an increase in function of 3 points  to improve basic and instrumental ADLs performance, including lymphedema self-care. Baseline: 39/100 Goal status: INITIAL  2.  With Max CG assistance for all home  program  components throughout Intensive Phase CDT  Pt will achieve at least 10% limb volume reductions bilaterally below the knees to return limb to more normal size and shape,  to increase body symmetry for increased balance, to limit infection risk, to decrease pain, and to limit lymphedema progression. Baseline: Dependent Goal status: INITIAL  3.  With Maximum caregiver assistance Pt will be able to don and doff appropriate daytime compression garments to limit re-accumulation of lymphatic congestion and to limit infection risk and lymphedema progression Baseline: Dependent Goal status: INITIAL  4.  With Max caregiver assistance Pt will achieve and sustain no less than 85% compliance with all LE self-care home program components throughout CDT, including modified simple self-MLD, daily skin care and inspection, lymphatic pumping the ex and appropriate compression to limit lymphedema progression and to limit further functional decline. Baseline: Dependent Goal status: INITIAL   PLAN: PT FREQUENCY: 3x/week  PT DURATION: 12 weeks  PLANNED INTERVENTIONS: Therapeutic exercises, Therapeutic activity, Patient/Family education, Self Care, DME instructions, Manual lymph drainage, Compression bandaging, Manual therapy, and compression garment/ device fitting, caregiver training,   PLAN FOR NEXT SESSION: OT Rx visit 1:  BLE comparative limb volumetrics RLE Multilayer compression bandaging using gradient techniques below the knee to base of toes  Pt and caregiver LE self-care education/training   Andrey Spearman, MS, OTR/L, CLT-LANA 06/26/22 2:50 PM

## 2022-06-26 NOTE — Addendum Note (Signed)
Addended by: Ansel Bong on: 06/26/2022 02:56 PM   Modules accepted: Orders

## 2022-06-27 ENCOUNTER — Ambulatory Visit: Payer: Medicare Other | Admitting: Occupational Therapy

## 2022-06-27 DIAGNOSIS — I890 Lymphedema, not elsewhere classified: Secondary | ICD-10-CM | POA: Diagnosis not present

## 2022-06-27 NOTE — Therapy (Signed)
OUTPATIENT OCCUPATIONAL THERAPY EVALUATION: BILATERAL LOWER EXTREMITY LYMPHEDEMA   Patient Name: Elizabeth Rowland MRN: [id number] DOB:1941/06/29, 81 y.o., female Today's Date: 06/27/2022    OT End of Session - 06/27/22 1510     Visit Number 2    Number of Visits 36    Date for OT Re-Evaluation 09/23/22    OT Start Time 0200    OT Stop Time 0310    OT Time Calculation (min) 70 min    Activity Tolerance Patient tolerated treatment well;No increased pain    Behavior During Therapy Flat affect             Past Medical History:  Diagnosis Date   Diabetes mellitus without complication [facility])    Past Surgical History:  Procedure Laterality Date   NO PAST SURGERIES     Patient Active Problem List   Diagnosis Date Noted   Lymphedema 05/28/2022   Swelling of limb 10/30/2021   Type 2 diabetes mellitus with hyperlipidemia ([place]) 02/01/2021   Pneumonia due to COVID-19 virus 01/28/2021   Hypomagnesemia 01/28/2021   Hypokalemia 01/28/2021   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[place]) 01/28/2021   Community acquired pneumonia    Lung mass    Acute respiratory failure with hypoxia ([place]) 01/07/2021   Acute respiratory distress syndrome (ARDS) due to COVID-19 virus ([place]) 01/07/2021   Pulmonary emboli ([place]) 01/07/2021   Severe sepsis with acute organ dysfunction ([place]) 01/06/2021   Hyperlipidemia 01/06/2021   Fibromyalgia 01/06/2021   Essential hypertension 01/06/2021    PCP: Marguerita Merles, MD  REFERRING PROVIDER: Algernon Huxley, MD  REFERRING DIAG: I89.0  THERAPY DIAG:  Lymphedema, not elsewhere classified  Rationale for Evaluation and Treatment Rehabilitation  ONSET DATE: 06/25/22  Chacra presents to OT for initial session if Intensive Phase CDT.  She is accompanied  by her son, [name]. Pt arrives in manual transport wc wearing socks and crocks on her feet, because she is unable to fit preferred street shoes.Pt endorses pain in legs bilaterally, but does not rate it numerically.   PERTINENT HISTORY:  Hx recurrent cellulitis , Urinary incontinence, Severe BLE venous stasis, pulmonary fibrosis' on 02,  Hyperlipidemia Fibromyalgia Essential hypertension 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[facility]) Severe sepsis with acute organ dysfunction ([facility]) Acute respiratory failure with hypoxia ([facility]) Acute respiratory distress syndrome (ARDS) due to COVID-19 virus [facility]) Pulmonary emboli ([place]) Community acquired pneumonia Lung mass Pneumonia due to COVID-19  virus Hypomagnesemia Hypokalemia Type 2 diabetes mellitus with hyperlipidemia ([facility]) Swelling of limb Lymphedema  PAIN:  Are you having pain? Yes: NPRS scale: not rated numerically/10 Pain location: legs Pain description: tight,  Heavy , squeezing, burning Aggravating factors standing, dependent sitting Relieving factors: elevation   IMPAIRMENTS: difficulty walking, impaired transfers, increase risk of falls and recurrent infections, impaired endurance, decreased balance, generalized weakness, impaired cognition,   FUNCTIONAL LIMITATIONS: limited functional ambulation and mobility,increase risk of falls and recurrent infections, impaired basic and instrumental ADLs performance (needs assistance with all basic and instrumental ADLs) , impaired performance of productive activities and leisure pursuits, limited social participation  PRECAUTIONS: Fall and Other: 02, Lymphedema Precautions: PULMONARY , DIABETES  WEIGHT BEARING RESTRICTIONS No  PRIOR LEVEL OF FUNCTION: Needs assistance with ADLs, Needs assistance with homemaking, Needs assistance with gait, Needs assistance with transfers, and Vocation/Vocational requirements:    PATIENT GOALS reduce swelling and limit progression per son, [name]   OBJECTIVE  OBSERVATIONS / OTHER  ASSESSMENTS:   LYMPHEDEMA ASSESSMENTS: Non-cancer related  FOTO OUTCOME SCORE: Intake 06/25/22 39/100  COMPARATIVE LIMB VOLUMETRICS: tba  OT Rx visit 1  LANDMARK Initial RIGHT  06/27/22  R LEG (A-D) 3737.5 ml  R THIGH (E-G)   R FULL LIMB (A-G)   Limb Volume differential (LVD)  7.4 %, R>L  Volume change since initial %  Volume change overall %  (Blank rows = not tested)  LANDMARK Initial LEFT 06/27/22  R LEG (A-D) 3461.5 ml  R THIGH (E-G)   R FULL LIMB (A-G)   Limb Volume differential (LVD)  %  Volume change since initial %  Volume change overall %  (Blank rows = not tested)  INFECTIONS:Hx Recurrent cellulitis  TODAY'S TREATMENT  Initial BLE comparative limb volumetrics RLE short stretch compression bandaging using 1 each 8, 10 and 12 cm wide x 5 meters long short stretch compression bandages over single layer of 4 mm thick x 5 meters long Rosidal Soft foam and cotton stockinett.  Pt and family education for how volumetrics assist [place] with measuring progress towards goals for volume reduction. Intro level bandaging edu- short stretch technology vs long stretch  PATIENT EDUCATION:  Education details: See above under Today's Treatment. Person educated: Patient and Child(ren) Education method: Explanation, Demonstration, Tactile cues, Verbal cues, and Handouts Education comprehension: verbalized understanding, returned demonstration, and needs further education   LYMPHEDEMA SELF CARE HOME PROGRAM: Multilayer gradient compression wraps from base of toes to below the knee  23/7.  BLE Lymphatic Pumping Therex 10 reps each element, hold for 5 seconds, in order, 1 x daily, while in compression Daily skin care, bathing with soap and water and hydrating with low ph Eucerin cream Simple self-MLD daily  ASSESSMENT:  CLINICAL IMPRESSION: Initial comparative limb volumetrics reveal a surprisingly low 7.4% limb volume differential (LVD), R (dominant) > L leg. Applied R leg knee length  multilayer compression wraps using gradient technique from base of toes to tibial tuberosity. Pt and son instructed to leave wraps in place for 24 hours if possible. Pt and son instructed to remove wraps if she has pain or atypical SOB. Pt and son instructed on bandage wear and laundry regimes. Pt and son instructed to purchase 2" wide masking tape for wrapping at home between sessions, and to obtain Eucerin thick cream in a tub for daily skin care. Pt instructed to bring clean wraps to each session using a tote designated just for compression kit.  All questions answered. Good return.Cont as per POC.  OBJECTIVE IMPAIRMENTS Abnormal gait, cardiopulmonary status limiting activity, decreased activity tolerance, decreased balance, decreased cognition, decreased endurance, decreased knowledge of condition, decreased knowledge of use of DME, decreased mobility, difficulty walking, decreased ROM, decreased strength, increased edema, impaired flexibility, postural dysfunction, obesity, and pain.   ACTIVITY LIMITATIONS standing, stairs, transfers, bed mobility, continence, bathing, toileting, dressing, and hygiene/grooming  PARTICIPATION LIMITATIONS: meal prep, cleaning, laundry, medication management, driving, shopping, community activity, occupation, yard work, school, and church  PERSONAL FACTORS Age, Past/current experiences, Time since onset of injury/illness/exacerbation, 1-2 co morbidities: Level of dependence on others for ADLs , including self care also affect patient's functional outcome.   REHAB POTENTIAL: Fair  with daily consistent Max assistance with all lymphedema self-care home program components- during Intensive Phase CDT and ongoing.   EVALUATION COMPLEXITY: High   GOALS: Goals reviewed with patient/ caregiver? Yes  SHORT TERM GOALS: Target date: 07/10/2022    Pt will verbalize understanding of lymphedema precautions and prevention strategies and signs and symptoms of cellulitis  infection with maximum assistance from caregiver  (using printed reference to reduce risk of progression and to limit infection risk. Baseline:Dependent  Goal status: INITIAL  2.  With max caregiver assistance Pt will be able to don and doff multilayer, knee length, compression wraps using gradient techniques from base of toes to popliteal fossa ( one limb at a time) to decrease limb volume, to limit infection risk, and to limit lymphedema progression.  Baseline: Dependent Goal status: INITIAL   LONG TERM GOALS: Target date: 09/18/2022   Given this patient's Intake score of 39/100% on the functional outcomes FOTO tool, patient will experience an increase in function of 3 points  to improve basic and instrumental ADLs performance, including lymphedema self-care. Baseline: 39/100 Goal status: INITIAL  2.  With Max CG assistance for all home program  components throughout Intensive Phase CDT  Pt will achieve at least 10% limb volume reductions bilaterally below the knees to return limb to more normal size and shape,  to increase body symmetry for increased balance, to limit infection risk, to decrease pain, and to limit lymphedema progression. Baseline: Dependent Goal status: INITIAL  3.  With Maximum caregiver assistance Pt will be able to don and doff appropriate daytime compression garments to limit re-accumulation of lymphatic congestion and to limit infection risk and lymphedema progression Baseline: Dependent Goal status: INITIAL  4.  With Max caregiver assistance Pt will achieve and sustain no less than 85% compliance with all LE self-care home program components throughout CDT, including modified simple self-MLD, daily skin care and inspection, lymphatic pumping the ex and appropriate compression to limit lymphedema progression and to limit further functional decline. Baseline: Dependent Goal status: INITIAL   PLAN: OT FREQUENCY: 3x/week  OT DURATION: 12 weeks  PLANNED  INTERVENTIONS: Therapeutic exercises, Therapeutic activity, Patient/Family education, Self Care, DME instructions, Manual lymph drainage, Compression bandaging, Manual therapy, and compression garment/ device fitting, caregiver training,   PLAN FOR NEXT SESSION: OT Rx visit 1:  Pt and caregiver LE self-care education/training for multilayer gradient compression bandaging.   Andrey Spearman, MS, OTR/L, CLT-LANA 06/27/22 3:12 PM

## 2022-07-01 ENCOUNTER — Ambulatory Visit: Payer: Medicare Other | Admitting: Occupational Therapy

## 2022-07-04 ENCOUNTER — Ambulatory Visit: Payer: Medicare Other | Admitting: Occupational Therapy

## 2022-07-04 ENCOUNTER — Encounter (INDEPENDENT_AMBULATORY_CARE_PROVIDER_SITE_OTHER): Payer: Medicare Other

## 2022-07-04 ENCOUNTER — Ambulatory Visit (INDEPENDENT_AMBULATORY_CARE_PROVIDER_SITE_OTHER): Payer: Medicare Other | Admitting: Nurse Practitioner

## 2022-07-04 DIAGNOSIS — I890 Lymphedema, not elsewhere classified: Secondary | ICD-10-CM

## 2022-07-04 NOTE — Therapy (Signed)
OUTPATIENT OCCUPATIONAL THERAPY EVALUATION: BILATERAL LOWER EXTREMITY LYMPHEDEMA   Patient Name: Elizabeth Rowland MRN: [id number] DOB:Feb 16, 1941, 81 y.o., female Today's Date: 07/04/2022    OT End of Session - 07/04/22 1454     Visit Number 3    Number of Visits 36    Date for OT Re-Evaluation 09/23/22    OT Start Time 0200    OT Stop Time 0300    OT Time Calculation (min) 60 min    Activity Tolerance Patient tolerated treatment well;No increased pain    Behavior During Therapy WNL            Past Medical History:  Diagnosis Date   Diabetes mellitus without complication [facility])    Past Surgical History:  Procedure Laterality Date   NO PAST SURGERIES     Patient Active Problem List   Diagnosis Date Noted   Lymphedema 05/28/2022   Swelling of limb 10/30/2021   Type 2 diabetes mellitus with hyperlipidemia (HCC) 02/01/2021   Pneumonia due to COVID-19 virus 01/28/2021   Hypomagnesemia 01/28/2021   Hypokalemia 01/28/2021   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HCC) 01/28/2021   Community acquired pneumonia    Lung mass    Acute respiratory failure with hypoxia (HCC) 01/07/2021   Acute respiratory distress syndrome (ARDS) due to COVID-19 virus (HCC) 01/07/2021   Pulmonary emboli (HCC) 01/07/2021   Severe sepsis with acute organ dysfunction (HCC) 01/06/2021   Hyperlipidemia 01/06/2021   Fibromyalgia 01/06/2021   Essential hypertension 01/06/2021    PCP: Leanna Sato, MD  REFERRING PROVIDER: Annice Needy, MD  REFERRING DIAG: I89.0  THERAPY DIAG:  Lymphedema, not elsewhere classified  Rationale for Evaluation and Treatment Rehabilitation  ONSET DATE: 06/25/22  SUBJECTIVE                                                                                                                                                                                           SUBJECTIVE STATEMENT:Elizabeth Rowland presents to OT for Intensive Phase CDT to RLE.  She is accompanied by her son, [name].  Pt arrives in manual transport wc wearing socks and crocks. Pt has not obtained recommended post op shoe or lace up tennis shoes to ensure safety. Pt endorses pain in legs bilaterally, but does not rate it numerically. Pt brings compression wraps, washed and rolled, to clinic.  PERTINENT HISTORY:  Hx recurrent cellulitis , Urinary incontinence, Severe BLE venous stasis, pulmonary fibrosis' on 02,  Hyperlipidemia Fibromyalgia Essential hypertension 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HCC) Severe sepsis with acute organ dysfunction (HCC) Acute respiratory failure with hypoxia (HCC) Acute respiratory distress syndrome (ARDS) due to COVID-19 virus [facility]) Pulmonary emboli (  [place]) Community acquired pneumonia Lung mass Pneumonia due to COVID-19 virus Hypomagnesemia Hypokalemia Type 2 diabetes mellitus with hyperlipidemia (HCC) Swelling of limb Lymphedema  PAIN:  Are you having pain? Yes: NPRS scale: not rated numerically/10 Pain location: legs Pain description: tight,  Heavy , squeezing, burning Aggravating factors standing, dependent sitting Relieving factors: elevation   IMPAIRMENTS: difficulty walking, impaired transfers, increase risk of falls and recurrent infections, impaired endurance, decreased balance, generalized weakness, impaired cognition,   FUNCTIONAL LIMITATIONS: limited functional ambulation and mobility,increase risk of falls and recurrent infections, impaired basic and instrumental ADLs performance (needs assistance with all basic and instrumental ADLs) , impaired performance of productive activities and leisure pursuits, limited social participation  PRECAUTIONS: Fall and Other: 02, Lymphedema Precautions: PULMONARY , DIABETES  WEIGHT BEARING RESTRICTIONS No  PRIOR LEVEL OF FUNCTION: Needs assistance with ADLs, Needs assistance with homemaking, Needs assistance with gait, Needs assistance with transfers, and Vocation/Vocational requirements:    PATIENT GOALS reduce swelling and limit  progression per son, [name]   OBJECTIVE  OBSERVATIONS / OTHER ASSESSMENTS:   LYMPHEDEMA ASSESSMENTS: Non-cancer related  FOTO OUTCOME SCORE: Intake 06/25/22 39/100  COMPARATIVE LIMB VOLUMETRICS: tba  OT Rx visit 1  LANDMARK Initial RIGHT  06/27/22  R LEG (A-D) 3737.5 ml  R THIGH (E-G)   R FULL LIMB (A-G)   Limb Volume differential (LVD)  7.4 %, R>L  Volume change since initial %  Volume change overall %  (Blank rows = not tested)  LANDMARK Initial LEFT 06/27/22  R LEG (A-D) 3461.5 ml  R THIGH (E-G)   R FULL LIMB (A-G)   Limb Volume differential (LVD)  %  Volume change since initial %  Volume change overall %  (Blank rows = not tested)  INFECTIONS:Hx Recurrent cellulitis  TODAY'S TREATMENT  Pt and CG trainingfor multilayer compression bandaging using 1 each 8, 10 and 12 cm wide x 5 meters long short stretch compression bandages over single layer of 4 mm thick x 5 meters long Rosidal Soft foam and cotton stockinett.   PATIENT EDUCATION:  Education details: See above under Today's Treatment. Person educated: Patient and Child(ren) Education method: Explanation, Demonstration, Tactile cues, Verbal cues, and Handouts Education comprehension: verbalized understanding, returned demonstration, and needs further education   LYMPHEDEMA SELF CARE HOME PROGRAM: Multilayer gradient compression wraps from base of toes to below the knee  23/7.  BLE Lymphatic Pumping Therex 10 reps each element, hold for 5 seconds, in order, 1 x daily, while in compression Daily skin care, bathing with soap and water and hydrating with low ph Eucerin cream Simple self-MLD daily  ASSESSMENT:  CLINICAL IMPRESSION: Pt and son fully participated in self-care training for le home program. By end of session Pt able to apply knee length compression wraps using gradient techniques with min A. Pt good at spontaneously lifting leg to reposition to help son apply wraps. Good teamwork. Issued 2nd set of wraps.  Next session commence RLE MLD and continue compression wrapping. Cont as per POC.  OBJECTIVE IMPAIRMENTS Abnormal gait, cardiopulmonary status limiting activity, decreased activity tolerance, decreased balance, decreased cognition, decreased endurance, decreased knowledge of condition, decreased knowledge of use of DME, decreased mobility, difficulty walking, decreased ROM, decreased strength, increased edema, impaired flexibility, postural dysfunction, obesity, and pain.   ACTIVITY LIMITATIONS standing, stairs, transfers, bed mobility, continence, bathing, toileting, dressing, and hygiene/grooming  PARTICIPATION LIMITATIONS: meal prep, cleaning, laundry, medication management, driving, shopping, community activity, occupation, yard work, school, and church  PERSONAL FACTORS Age, Past/current experiences, Time  since onset of injury/illness/exacerbation, 1-2 co morbidities: Level of dependence on others for ADLs , including self care also affect patient's functional outcome.   REHAB POTENTIAL: Fair  with daily consistent Max assistance with all lymphedema self-care home program components- during Intensive Phase CDT and ongoing.    GOALS: Goals reviewed with patient/ caregiver? Yes  SHORT TERM GOALS: Target date: 07/10/2022    Pt will verbalize understanding of lymphedema precautions and prevention strategies and signs and symptoms of cellulitis infection with maximum assistance from caregiver  (using printed reference to reduce risk of progression and to limit infection risk. Baseline:Dependent  Goal status: INITIAL  2.  With max caregiver assistance Pt will be able to don and doff multilayer, knee length, compression wraps using gradient techniques from base of toes to popliteal fossa ( one limb at a time) to decrease limb volume, to limit infection risk, and to limit lymphedema progression.  Baseline: Dependent Goal status: INITIAL   LONG TERM GOALS: Target date: 09/18/2022   Given this  patient's Intake score of 39/100% on the functional outcomes FOTO tool, patient will experience an increase in function of 3 points  to improve basic and instrumental ADLs performance, including lymphedema self-care. Baseline: 39/100 Goal status: INITIAL  2.  With Max CG assistance for all home program  components throughout Intensive Phase CDT  Pt will achieve at least 10% limb volume reductions bilaterally below the knees to return limb to more normal size and shape,  to increase body symmetry for increased balance, to limit infection risk, to decrease pain, and to limit lymphedema progression. Baseline: Dependent Goal status: INITIAL  3.  With Maximum caregiver assistance Pt will be able to don and doff appropriate daytime compression garments to limit re-accumulation of lymphatic congestion and to limit infection risk and lymphedema progression Baseline: Dependent Goal status: INITIAL  4.  With Max caregiver assistance Pt will achieve and sustain no less than 85% compliance with all LE self-care home program components throughout CDT, including modified simple self-MLD, daily skin care and inspection, lymphatic pumping the ex and appropriate compression to limit lymphedema progression and to limit further functional decline. Baseline: Dependent Goal status: INITIAL   PLAN: OT FREQUENCY: 3x/week  OT DURATION: 12 weeks  PLANNED INTERVENTIONS: Therapeutic exercises, Therapeutic activity, Patient/Family education, Self Care, DME instructions, Manual lymph drainage, Compression bandaging, Manual therapy, and compression garment/ device fitting, caregiver training,   PLAN FOR NEXT SESSION: OT Rx visit 1:  Commence RLW MLD. Apply RLE knee length compression wraps. Cont Pt and CG edu for LE self care home program..   Loel Dubonnet, MS, OTR/L, CLT-LANA 07/04/22 3:48 PM

## 2022-07-09 ENCOUNTER — Ambulatory Visit: Payer: Medicare Other | Admitting: Occupational Therapy

## 2022-07-09 DIAGNOSIS — I890 Lymphedema, not elsewhere classified: Secondary | ICD-10-CM

## 2022-07-09 NOTE — Therapy (Signed)
OUTPATIENT OCCUPATIONAL THERAPY EVALUATION: BILATERAL LOWER EXTREMITY LYMPHEDEMA   Patient Name: Elizabeth Rowland MRN: [id number] DOB:15-Dec-1940, 81 y.o., female Today's Date: 07/09/2022    OT End of Session - 07/09/22 1315     Visit Number 4    Number of Visits 36    Date for OT Re-Evaluation 09/23/22    OT Start Time 0105    OT Stop Time 0215    OT Time Calculation (min) 70 min    Activity Tolerance Patient tolerated treatment well;No increased pain    Behavior During Therapy Flat affect               Past Medical History:  Diagnosis Date   Diabetes mellitus without complication [facility])    Past Surgical History:  Procedure Laterality Date   NO PAST SURGERIES     Patient Active Problem List   Diagnosis Date Noted   Lymphedema 05/28/2022   Swelling of limb 10/30/2021   Type 2 diabetes mellitus with hyperlipidemia ([place]) 02/01/2021   Pneumonia due to COVID-19 virus 01/28/2021   Hypomagnesemia 01/28/2021   Hypokalemia 01/28/2021   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[place]) 01/28/2021   Community acquired pneumonia    Lung mass    Acute respiratory failure with hypoxia ([place]) 01/07/2021   Acute respiratory distress syndrome (ARDS) due to COVID-19 virus ([place]) 01/07/2021   Pulmonary emboli ([place]) 01/07/2021   Severe sepsis with acute organ dysfunction ([place]) 01/06/2021   Hyperlipidemia 01/06/2021   Fibromyalgia 01/06/2021   Essential hypertension 01/06/2021    PCP: Marguerita Merles, MD  REFERRING PROVIDER: Algernon Huxley, MD  REFERRING DIAG: I89.0  THERAPY DIAG:  Lymphedema, not elsewhere classified  Rationale for Evaluation and Treatment Rehabilitation  ONSET DATE: 06/25/22  Ranlo presents to OT for Intensive Phase CDT to RLE.  She is accompanied by her  son, [name]. Pt arrives in manual transport wc wearing socks and crocks. Pt has not obtained recommended post op shoe or lace up tennis shoes to ensure safety. Pt reports B leg pain 5/10, described as soreness and stinging.  PERTINENT HISTORY:  Hx recurrent cellulitis , Urinary incontinence, Severe BLE venous stasis, pulmonary fibrosis' on 02,  Hyperlipidemia Fibromyalgia Essential hypertension 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HCC) Severe sepsis with acute organ dysfunction (HCC) Acute respiratory failure with hypoxia (HCC) Acute respiratory distress syndrome (ARDS) due to COVID-19 virus [facility]) Pulmonary emboli ([place]) Community acquired pneumonia Lung mass  Pneumonia due to COVID-19 virus Hypomagnesemia Hypokalemia Type 2 diabetes mellitus with hyperlipidemia (HCC) Swelling of limb Lymphedema  PAIN:  Are you having pain? Yes: NPRS scale: 510 Pain location: legs Pain description: tight,  Heavy , squeezing, burning Aggravating factors standing, dependent sitting Relieving factors: elevation   IMPAIRMENTS: difficulty walking, impaired transfers, increase risk of falls and recurrent infections, impaired endurance, decreased balance, generalized weakness, impaired cognition,   FUNCTIONAL LIMITATIONS: limited functional ambulation and mobility,increase risk of falls and recurrent infections, impaired basic and instrumental ADLs performance (needs assistance with all basic and instrumental ADLs) , impaired performance of productive activities and leisure pursuits, limited social participation  PRECAUTIONS: Fall and Other: 02, Lymphedema Precautions: PULMONARY , DIABETES  WEIGHT BEARING RESTRICTIONS No  PRIOR LEVEL OF FUNCTION: Needs assistance with ADLs, Needs assistance with homemaking, Needs assistance with gait, Needs assistance with transfers, and Vocation/Vocational requirements:    PATIENT GOALS reduce swelling and limit progression per son, [name]   OBJECTIVE  OBSERVATIONS / OTHER ASSESSMENTS:    LYMPHEDEMA ASSESSMENTS: Non-cancer related  FOTO OUTCOME SCORE: Intake 06/25/22 39/100  COMPARATIVE LIMB VOLUMETRICS: tba  OT Rx visit 1  LANDMARK Initial RIGHT  06/27/22  R LEG (A-D) 3737.5 ml  R THIGH (E-G)   R FULL LIMB (A-G)   Limb Volume differential (LVD)  7.4 %, R>L  Volume change since initial %  Volume change overall %  (Blank rows = not tested)  LANDMARK Initial LEFT 06/27/22  R LEG (A-D) 3461.5 ml  R THIGH (E-G)   R FULL LIMB (A-G)   Limb Volume differential (LVD)  %  Volume change since initial %  Volume change overall %  (Blank rows = not tested)  INFECTIONS:Hx Recurrent cellulitis  TODAY'S TREATMENT  Pt and CG training for multilayer compression bandaging using 1 each 8, 10 and 12 cm wide x 5 meters long short stretch compression bandages over single layer of 4 mm thick x 5 meters long Rosidal Soft foam and cotton stockinett.  Pt/family edu re post op shoe needed to reduce fall risk/ safety and reduce risk of foot injury MLD with simultaneous skin care to RLE.  PATIENT EDUCATION:  Education details: Continued Pt/ CG edu for lymphedema self care home program throughout session. Topics include outcome of comparative limb volumetrics- starting limb volume differentials (LVDs), technology and gradient techniques used for short stretch, multilayer compression wrapping, simple self-MLD, therapeutic lymphatic pumping exercises, skin/nail care, LE precautions,. compression garment recommendations and specifications, wear and care schedule and compression garment donning / doffing w assistive devices. Discussed progress towards all OT goals since commencing CDT. All questions answered to the Pt's satisfaction. Good return. Person educated: Patient and Child(ren) Education method: Explanation, Demonstration, Tactile cues, Verbal cues, and Handouts Education comprehension: verbalized understanding, returned demonstration, and needs further education   LYMPHEDEMA SELF CARE  HOME PROGRAM: Multilayer gradient compression wraps from base of toes to below the knee  23/7.  BLE Lymphatic Pumping Therex 10 reps each element, hold for 5 seconds, in order, 1 x daily, while in compression Daily skin care, bathing with soap and water and hydrating with low ph Eucerin cream Simple self-MLD daily  ASSESSMENT:  CLINICAL IMPRESSION: Reiterated importance of obtaining adjustable post op shoe, or using lace up tennis shoe during Intensive Phase CDT to limit fall risk and to limit risk of  injury to Pt's diabetic foot. To date Pt has not obtained recommended footwear. Provided resources for obtaining shoe  and showed photos on line for reference. Strongly  encouraged Pt's son to assist her with obtaining recommended footwear  before next visit. Commenced MLD and simultaneous skin care to RLE. Pt tolerated well without increased pain. Reviewed gradient compression bandaging with Pt and her son. All questions answered. Cont as per POC.  OBJECTIVE IMPAIRMENTS Abnormal gait, cardiopulmonary status limiting activity, decreased activity tolerance, decreased balance, decreased cognition, decreased endurance, decreased knowledge of condition, decreased knowledge of use of DME, decreased mobility, difficulty walking, decreased ROM, decreased strength, increased edema, impaired flexibility, postural dysfunction, obesity, and pain.   ACTIVITY LIMITATIONS standing, stairs, transfers, bed mobility, continence, bathing, toileting, dressing, and hygiene/grooming  PARTICIPATION LIMITATIONS: meal prep, cleaning, laundry, medication management, driving, shopping, community activity, occupation, yard work, school, and church  PERSONAL FACTORS Age, Past/current experiences, Time since onset of injury/illness/exacerbation, 1-2 co morbidities: Level of dependence on others for ADLs , including self care also affect patient's functional outcome.   REHAB POTENTIAL: Fair  with daily consistent Max assistance  with all lymphedema self-care home program components- during Intensive Phase CDT and ongoing.    GOALS: Goals reviewed with patient/ caregiver? Yes  SHORT TERM GOALS: Target date: 07/10/2022    Pt will verbalize understanding of lymphedema precautions and prevention strategies and signs and symptoms of cellulitis infection with maximum assistance from caregiver  (using printed reference to reduce risk of progression and to limit infection risk. Baseline:Dependent  Goal status: INITIAL  2.  With max caregiver assistance Pt will be able to don and doff multilayer, knee length, compression wraps using gradient techniques from base of toes to popliteal fossa ( one limb at a time) to decrease limb volume, to limit infection risk, and to limit lymphedema progression.  Baseline: Dependent Goal status: INITIAL   LONG TERM GOALS: Target date: 09/18/2022   Given this patient's Intake score of 39/100% on the functional outcomes FOTO tool, patient will experience an increase in function of 3 points  to improve basic and instrumental ADLs performance, including lymphedema self-care. Baseline: 39/100 Goal status: INITIAL  2.  With Max CG assistance for all home program  components throughout Intensive Phase CDT  Pt will achieve at least 10% limb volume reductions bilaterally below the knees to return limb to more normal size and shape,  to increase body symmetry for increased balance, to limit infection risk, to decrease pain, and to limit lymphedema progression. Baseline: Dependent Goal status: INITIAL  3.  With Maximum caregiver assistance Pt will be able to don and doff appropriate daytime compression garments to limit re-accumulation of lymphatic congestion and to limit infection risk and lymphedema progression Baseline: Dependent Goal status: INITIAL  4.  With Max caregiver assistance Pt will achieve and sustain no less than 85% compliance with all LE self-care home program components throughout  CDT, including modified simple self-MLD, daily skin care and inspection, lymphatic pumping the ex and appropriate compression to limit lymphedema progression and to limit further functional decline. Baseline: Dependent Goal status: INITIAL   PLAN: OT FREQUENCY: 3x/week  OT DURATION: 12 weeks  PLANNED INTERVENTIONS: Therapeutic exercises, Therapeutic activity, Patient/Family education, Self Care, DME instructions, Manual lymph drainage, Compression bandaging, Manual therapy, and compression garment/ device fitting, caregiver training,   PLAN FOR NEXT SESSION: OT Rx visit 1:  Commence RLW MLD. Apply RLE knee length compression wraps. Cont Pt and CG edu for LE self care home program..   Andrey Spearman, MS, OTR/L, CLT-LANA 07/09/22 2:12 PM

## 2022-07-11 ENCOUNTER — Ambulatory Visit: Payer: Medicare Other | Admitting: Occupational Therapy

## 2022-07-11 DIAGNOSIS — I890 Lymphedema, not elsewhere classified: Secondary | ICD-10-CM | POA: Diagnosis not present

## 2022-07-11 NOTE — Therapy (Signed)
OUTPATIENT OCCUPATIONAL THERAPY EVALUATION: BILATERAL LOWER EXTREMITY LYMPHEDEMA   Patient Name: Elizabeth Rowland MRN: [id number] DOB:October 19, 1940, 81 y.o., female Today's Date: 07/11/2022    OT End of Session - 07/11/22 1511     Visit Number 5    Number of Visits 36    Date for OT Re-Evaluation 09/23/22    OT Start Time 0300    OT Stop Time 0405    OT Time Calculation (min) 65 min    Activity Tolerance Patient tolerated treatment well;No increased pain    Behavior During Therapy Flat affect               Past Medical History:  Diagnosis Date   Diabetes mellitus without complication [facility])    Past Surgical History:  Procedure Laterality Date   NO PAST SURGERIES     Patient Active Problem List   Diagnosis Date Noted   Lymphedema 05/28/2022   Swelling of limb 10/30/2021   Type 2 diabetes mellitus with hyperlipidemia (HCC) 02/01/2021   Pneumonia due to COVID-19 virus 01/28/2021   Hypomagnesemia 01/28/2021   Hypokalemia 01/28/2021   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HCC) 01/28/2021   Community acquired pneumonia    Lung mass    Acute respiratory failure with hypoxia (HCC) 01/07/2021   Acute respiratory distress syndrome (ARDS) due to COVID-19 virus (HCC) 01/07/2021   Pulmonary emboli (HCC) 01/07/2021   Severe sepsis with acute organ dysfunction (HCC) 01/06/2021   Hyperlipidemia 01/06/2021   Fibromyalgia 01/06/2021   Essential hypertension 01/06/2021    PCP: Leanna Sato, MD  REFERRING PROVIDER: Annice Needy, MD  REFERRING DIAG: I89.0  THERAPY DIAG:  Lymphedema, not elsewhere classified  Rationale for Evaluation and Treatment Rehabilitation  ONSET DATE: 06/25/22  SUBJECTIVE                                                                                                                                                                                           SUBJECTIVE STATEMENT:Elizabeth Rowland presents to OT for Intensive Phase CDT to RLE.  She is accompanied by her  son, Elizabeth Rowland. Pt arrives in manual transport with bandages in place and recommended post op shoe on R foot. Pt has no new complaints. Pain is unchanged. She states she tolerated compression wraps without increased pain between sessions.  PERTINENT HISTORY:  Hx recurrent cellulitis , Urinary incontinence, Severe BLE venous stasis, pulmonary fibrosis' on 02,  Hyperlipidemia Fibromyalgia Essential hypertension 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HCC) Severe sepsis with acute organ dysfunction (HCC) Acute respiratory failure with hypoxia (HCC) Acute respiratory distress syndrome (ARDS) due to COVID-19 virus [facility]) Pulmonary emboli (HCC) Community acquired pneumonia Lung mass Pneumonia  due to COVID-19 virus Hypomagnesemia Hypokalemia Type 2 diabetes mellitus with hyperlipidemia (HCC) Swelling of limb Lymphedema  PAIN:  Are you having pain? Yes: NPRS scale: 510 Pain location: legs Pain description: tight,  Heavy , squeezing, burning Aggravating factors standing, dependent sitting Relieving factors: elevation   IMPAIRMENTS: difficulty walking, impaired transfers, increase risk of falls and recurrent infections, impaired endurance, decreased balance, generalized weakness, impaired cognition,   FUNCTIONAL LIMITATIONS: limited functional ambulation and mobility,increase risk of falls and recurrent infections, impaired basic and instrumental ADLs performance (needs assistance with all basic and instrumental ADLs) , impaired performance of productive activities and leisure pursuits, limited social participation  PRECAUTIONS: Fall and Other: 02, Lymphedema Precautions: PULMONARY , DIABETES  PRIOR LEVEL OF FUNCTION: Needs assistance with ADLs, Needs assistance with homemaking, Needs assistance with gait, Needs assistance with transfers, and Vocation/Vocational requirements:    PATIENT GOALS reduce swelling and limit progression per son, [name]   OBJECTIVE  OBSERVATIONS / OTHER ASSESSMENTS:   LYMPHEDEMA  ASSESSMENTS: Non-cancer related  FOTO OUTCOME SCORE: Intake 06/25/22 39/100  COMPARATIVE LIMB VOLUMETRICS: tba  OT Rx visit 1  LANDMARK Initial RIGHT  06/27/22  R LEG (A-D) 3737.5 ml  R THIGH (E-G)   R FULL LIMB (A-G)   Limb Volume differential (LVD)  7.4 %, R>L  Volume change since initial %  Volume change overall %  (Blank rows = not tested)  LANDMARK Initial LEFT 06/27/22  R LEG (A-D) 3461.5 ml  R THIGH (E-G)   R FULL LIMB (A-G)   Limb Volume differential (LVD)  %  Volume change since initial %  Volume change overall %  (Blank rows = not tested)  INFECTIONS:Hx Recurrent cellulitis  TODAY'S TREATMENT  Pt and CG training for multilayer compression bandaging . Applied RLE multilayer compression wraps using 1 each 8, 10 and 12 cm wide x 5 meters long short stretch compression bandages over single layer of 4 mm thick x 5 meters long Rosidal Soft foam and cotton stockinett.  MLD to RLE/RLQ utilizing functional inguinal watershed.  PATIENT EDUCATION:  Education details: Continued Pt/ CG edu for lymphedema self care home program throughout session. Topics include outcome of comparative limb volumetrics- starting limb volume differentials (LVDs), technology and gradient techniques used for short stretch, multilayer compression wrapping, simple self-MLD, therapeutic lymphatic pumping exercises, skin/nail care, LE precautions,. compression garment recommendations and specifications, wear and care schedule and compression garment donning / doffing w assistive devices. Discussed progress towards all OT goals since commencing CDT. All questions answered to the Pt's satisfaction. Good return. Person educated: Patient and Child(ren) Education method: Explanation, Demonstration, Tactile cues, Verbal cues, and Handouts Education comprehension: verbalized understanding, returned demonstration, and needs further education   LYMPHEDEMA SELF CARE HOME PROGRAM: Multilayer gradient compression  wraps from base of toes to below the knee  23/7.  BLE Lymphatic Pumping Therex 10 reps each element, hold for 5 seconds, in order, 1 x daily, while in compression Daily skin care, bathing with soap and water and hydrating with low ph Eucerin cream Simple self-MLD daily  ASSESSMENT:  CLINICAL IMPRESSION:Pt presenting today with excellent limb volume reduction below the knee. Stasis dermatitis and induration is unchanged. Son did a nice job with wraps application despite a couple of very minor mistakes. Pt attentive throughout Pt edu and performance of RLE/RLQ MLD. All questions answered. Reviewed wrapping techniques with Pt and family while reapplying wraps. Productive session. Cont as per POC.   OBJECTIVE IMPAIRMENTS Abnormal gait, cardiopulmonary status limiting activity, decreased activity tolerance,  decreased balance, decreased cognition, decreased endurance, decreased knowledge of condition, decreased knowledge of use of DME, decreased mobility, difficulty walking, decreased ROM, decreased strength, increased edema, impaired flexibility, postural dysfunction, obesity, and pain.   ACTIVITY LIMITATIONS standing, stairs, transfers, bed mobility, continence, bathing, toileting, dressing, and hygiene/grooming  PARTICIPATION LIMITATIONS: meal prep, cleaning, laundry, medication management, driving, shopping, community activity, occupation, yard work, school, and church  PERSONAL FACTORS Age, Past/current experiences, Time since onset of injury/illness/exacerbation, 1-2 co morbidities: Level of dependence on others for ADLs , including self care also affect patient's functional outcome.   REHAB POTENTIAL: Fair  with daily consistent Max assistance with all lymphedema self-care home program components- during Intensive Phase CDT and ongoing.    GOALS: Goals reviewed with patient/ caregiver? Yes  SHORT TERM GOALS: Target date: 07/10/2022    Pt will verbalize understanding of lymphedema  precautions and prevention strategies and signs and symptoms of cellulitis infection with maximum assistance from caregiver  (using printed reference to reduce risk of progression and to limit infection risk. Baseline:Dependent  Goal status: INITIAL  2.  With max caregiver assistance Pt will be able to don and doff multilayer, knee length, compression wraps using gradient techniques from base of toes to popliteal fossa ( one limb at a time) to decrease limb volume, to limit infection risk, and to limit lymphedema progression.  Baseline: Dependent Goal status: INITIAL   LONG TERM GOALS: Target date: 09/18/2022   Given this patient's Intake score of 39/100% on the functional outcomes FOTO tool, patient will experience an increase in function of 3 points  to improve basic and instrumental ADLs performance, including lymphedema self-care. Baseline: 39/100 Goal status: INITIAL  2.  With Max CG assistance for all home program  components throughout Intensive Phase CDT  Pt will achieve at least 10% limb volume reductions bilaterally below the knees to return limb to more normal size and shape,  to increase body symmetry for increased balance, to limit infection risk, to decrease pain, and to limit lymphedema progression. Baseline: Dependent Goal status: INITIAL  3.  With Maximum caregiver assistance Pt will be able to don and doff appropriate daytime compression garments to limit re-accumulation of lymphatic congestion and to limit infection risk and lymphedema progression Baseline: Dependent Goal status: INITIAL  4.  With Max caregiver assistance Pt will achieve and sustain no less than 85% compliance with all LE self-care home program components throughout CDT, including modified simple self-MLD, daily skin care and inspection, lymphatic pumping the ex and appropriate compression to limit lymphedema progression and to limit further functional decline. Baseline: Dependent Goal status:  INITIAL   PLAN: OT FREQUENCY: 3x/week  OT DURATION: 12 weeks  PLANNED INTERVENTIONS: Therapeutic exercises, Therapeutic activity, Patient/Family education, Self Care, DME instructions, Manual lymph drainage, Compression bandaging, Manual therapy, and compression garment/ device fitting, caregiver training,   PLAN FOR NEXT SESSION: OT Rx visit 1:  Commence RLW MLD. Apply RLE knee length compression wraps. Cont Pt and CG edu for LE self care home program..   Loel Dubonnet, MS, OTR/L, CLT-LANA 07/11/22 4:07 PM

## 2022-07-16 ENCOUNTER — Ambulatory Visit: Payer: Medicare Other | Admitting: Occupational Therapy

## 2022-07-16 DIAGNOSIS — I890 Lymphedema, not elsewhere classified: Secondary | ICD-10-CM | POA: Diagnosis not present

## 2022-07-17 NOTE — Therapy (Signed)
OUTPATIENT OCCUPATIONAL THERAPY EVALUATION: BILATERAL LOWER EXTREMITY LYMPHEDEMA   Patient Name: Elizabeth Rowland MRN: [id number] DOB:Feb 11, 1941, 81 y.o., female Today's Date: 07/17/2022    OT End of Session - 07/16/22 1242     Visit Number 6    Number of Visits 36    Date for OT Re-Evaluation 09/23/22    OT Start Time 0200    OT Stop Time 0315    OT Time Calculation (min) 75 min    Activity Tolerance Patient tolerated treatment well;No increased pain    Behavior During Therapy [facility] for tasks assessed/performed               Past Medical History:  Diagnosis Date   Diabetes mellitus without complication [facility])    Past Surgical History:  Procedure Laterality Date   NO PAST SURGERIES     Patient Active Problem List   Diagnosis Date Noted   Lymphedema 05/28/2022   Swelling of limb 10/30/2021   Type 2 diabetes mellitus with hyperlipidemia (HCC) 02/01/2021   Pneumonia due to COVID-19 virus 01/28/2021   Hypomagnesemia 01/28/2021   Hypokalemia 01/28/2021   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HCC) 01/28/2021   Community acquired pneumonia    Lung mass    Acute respiratory failure with hypoxia (HCC) 01/07/2021   Acute respiratory distress syndrome (ARDS) due to COVID-19 virus (HCC) 01/07/2021   Pulmonary emboli (HCC) 01/07/2021   Severe sepsis with acute organ dysfunction (HCC) 01/06/2021   Hyperlipidemia 01/06/2021   Fibromyalgia 01/06/2021   Essential hypertension 01/06/2021    PCP: Leanna Sato, MD  REFERRING PROVIDER: Annice Needy, MD  REFERRING DIAG: I89.0  THERAPY DIAG:  Lymphedema, not elsewhere classified  Rationale for Evaluation and Treatment Rehabilitation  ONSET DATE: 06/25/22  SUBJECTIVE                                                                                                                                                                                           SUBJECTIVE STATEMENT:Elizabeth Rowland presents to OT for Intensive Phase CDT to RLE.  She is  accompanied by her son, Elizabeth Rowland. Pt arrives in manual transport with bandages in place and recommended post op shoe on R foot. Pt has no new complaints. Pain is unchanged.   PERTINENT HISTORY:  Hx recurrent cellulitis , Urinary incontinence, Severe BLE venous stasis, pulmonary fibrosis' on 02,  Hyperlipidemia Fibromyalgia Essential hypertension 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HCC) Severe sepsis with acute organ dysfunction (HCC) Acute respiratory failure with hypoxia (HCC) Acute respiratory distress syndrome (ARDS) due to COVID-19 virus [facility]) Pulmonary emboli (HCC) Community acquired pneumonia Lung mass Pneumonia due to COVID-19 virus Hypomagnesemia Hypokalemia Type 2  diabetes mellitus with hyperlipidemia (HCC) Swelling of limb Lymphedema  PAIN:  Are you having pain? Yes: NPRS scale: 5/10 Pain location: legs Pain description: tight,  Heavy , squeezing, burning Aggravating factors standing, dependent sitting Relieving factors: elevation   IMPAIRMENTS: difficulty walking, impaired transfers, increase risk of falls and recurrent infections, impaired endurance, decreased balance, generalized weakness, impaired cognition,   FUNCTIONAL LIMITATIONS: limited functional ambulation and mobility,increase risk of falls and recurrent infections, impaired basic and instrumental ADLs performance (needs assistance with all basic and instrumental ADLs) , impaired performance of productive activities and leisure pursuits, limited social participation  PRECAUTIONS: Fall and Other: 02, Lymphedema Precautions: PULMONARY , DIABETES  PRIOR LEVEL OF FUNCTION: Needs assistance with ADLs, Needs assistance with homemaking, Needs assistance with gait, Needs assistance with transfers, and Vocation/Vocational requirements:    PATIENT GOALS reduce swelling and limit progression per son, [name]   OBJECTIVE  OBSERVATIONS / OTHER ASSESSMENTS:   LYMPHEDEMA ASSESSMENTS: Non-cancer related  FOTO OUTCOME SCORE: Intake 06/25/22  39/100  COMPARATIVE LIMB VOLUMETRICS: tba  OT Rx visit 1  LANDMARK Initial RIGHT  06/27/22  R LEG (A-D) 3737.5 ml  R THIGH (E-G)   R FULL LIMB (A-G)   Limb Volume differential (LVD)  7.4 %, R>L  Volume change since initial %  Volume change overall %  (Blank rows = not tested)  LANDMARK Initial LEFT 06/27/22  R LEG (A-D) 3461.5 ml  R THIGH (E-G)   R FULL LIMB (A-G)   Limb Volume differential (LVD)  %  Volume change since initial %  Volume change overall %  (Blank rows = not tested)  INFECTIONS:Hx Recurrent cellulitis  TODAY'S TREATMENT  oNGOING Pt and CG training for multilayer compression bandaging . Applied RLE multilayer compression wraps using 1 each 8, 10 and 12 cm wide x 5 meters long short stretch compression bandages over single layer of 4 mm thick x 5 meters long Rosidal Soft foam and cotton stockinett.  MLD to RLE/RLQ utilizing functional inguinal watershed.  PATIENT EDUCATION:  Education details: Continued Pt/ CG edu for lymphedema self care home program throughout session. Topics include outcome of comparative limb volumetrics- starting limb volume differentials (LVDs), technology and gradient techniques used for short stretch, multilayer compression wrapping, simple self-MLD, therapeutic lymphatic pumping exercises, skin/nail care, LE precautions,. compression garment recommendations and specifications, wear and care schedule and compression garment donning / doffing w assistive devices. Discussed progress towards all OT goals since commencing CDT. All questions answered to the Pt's satisfaction. Good return. Person educated: Patient and Child(ren) Education method: Explanation, Demonstration, Tactile cues, Verbal cues, and Handouts Education comprehension: verbalized understanding, returned demonstration, and needs further education   LYMPHEDEMA SELF CARE HOME PROGRAM: Multilayer gradient compression wraps from base of toes to below the knee  23/7.  BLE Lymphatic  Pumping Therex 10 reps each element, hold for 5 seconds, in order, 1 x daily, while in compression Daily skin care, bathing with soap and water and hydrating with low ph Eucerin cream Simple self-MLD daily  ASSESSMENT:  CLINICAL IMPRESSION: Pt continues to demonstrate progress towards OT goals. Son is diligently assisting with skin care and compression bandaging between visits. Limb volume is reducing and skin condition of RLE continues to improve. Pebbling continues to flatten out. Redness persists. Pt tolerated RLE/RLQ MLD, skin care and gradient wraps without increased pain today. Pt engaged more during session than has been typical. She asked questions re why skin condition is like it is. Cont as per POC.  OBJECTIVE IMPAIRMENTS Abnormal  gait, cardiopulmonary status limiting activity, decreased activity tolerance, decreased balance, decreased cognition, decreased endurance, decreased knowledge of condition, decreased knowledge of use of DME, decreased mobility, difficulty walking, decreased ROM, decreased strength, increased edema, impaired flexibility, postural dysfunction, obesity, and pain.   ACTIVITY LIMITATIONS standing, stairs, transfers, bed mobility, continence, bathing, toileting, dressing, and hygiene/grooming  PARTICIPATION LIMITATIONS: meal prep, cleaning, laundry, medication management, driving, shopping, community activity, occupation, yard work, school, and church  PERSONAL FACTORS Age, Past/current experiences, Time since onset of injury/illness/exacerbation, 1-2 co morbidities: Level of dependence on others for ADLs , including self care also affect patient's functional outcome.   REHAB POTENTIAL: Fair  with daily consistent Max assistance with all lymphedema self-care home program components- during Intensive Phase CDT and ongoing.    GOALS: Goals reviewed with patient/ caregiver? Yes  SHORT TERM GOALS: Target date: 07/10/2022    Pt will verbalize understanding of  lymphedema precautions and prevention strategies and signs and symptoms of cellulitis infection with maximum assistance from caregiver  (using printed reference to reduce risk of progression and to limit infection risk. Baseline:Dependent  Goal status: INITIAL  2.  With max caregiver assistance Pt will be able to don and doff multilayer, knee length, compression wraps using gradient techniques from base of toes to popliteal fossa ( one limb at a time) to decrease limb volume, to limit infection risk, and to limit lymphedema progression.  Baseline: Dependent Goal status: INITIAL   LONG TERM GOALS: Target date: 09/18/2022   Given this patient's Intake score of 39/100% on the functional outcomes FOTO tool, patient will experience an increase in function of 3 points  to improve basic and instrumental ADLs performance, including lymphedema self-care. Baseline: 39/100 Goal status: INITIAL  2.  With Max CG assistance for all home program  components throughout Intensive Phase CDT  Pt will achieve at least 10% limb volume reductions bilaterally below the knees to return limb to more normal size and shape,  to increase body symmetry for increased balance, to limit infection risk, to decrease pain, and to limit lymphedema progression. Baseline: Dependent Goal status: INITIAL  3.  With Maximum caregiver assistance Pt will be able to don and doff appropriate daytime compression garments to limit re-accumulation of lymphatic congestion and to limit infection risk and lymphedema progression Baseline: Dependent Goal status: INITIAL  4.  With Max caregiver assistance Pt will achieve and sustain no less than 85% compliance with all LE self-care home program components throughout CDT, including modified simple self-MLD, daily skin care and inspection, lymphatic pumping the ex and appropriate compression to limit lymphedema progression and to limit further functional decline. Baseline: Dependent Goal status:  INITIAL   PLAN: OT FREQUENCY: 3x/week  OT DURATION: 12 weeks  PLANNED INTERVENTIONS: Therapeutic exercises, Therapeutic activity, Patient/Family education, Self Care, DME instructions, Manual lymph drainage, Compression bandaging, Manual therapy, and compression garment/ device fitting, caregiver training,   PLAN FOR NEXT SESSION: OT Rx visit 1:  Commence RLW MLD. Apply RLE knee length compression wraps. Cont Pt and CG edu for LE self care home program..   Loel Dubonnet, MS, OTR/L, CLT-LANA 07/17/22 12:51 PM

## 2022-07-18 ENCOUNTER — Ambulatory Visit (INDEPENDENT_AMBULATORY_CARE_PROVIDER_SITE_OTHER): Payer: Medicare Other | Admitting: Nurse Practitioner

## 2022-07-18 ENCOUNTER — Encounter (INDEPENDENT_AMBULATORY_CARE_PROVIDER_SITE_OTHER): Payer: Medicare Other

## 2022-07-18 ENCOUNTER — Encounter (INDEPENDENT_AMBULATORY_CARE_PROVIDER_SITE_OTHER): Payer: Self-pay | Admitting: Nurse Practitioner

## 2022-07-18 ENCOUNTER — Ambulatory Visit (INDEPENDENT_AMBULATORY_CARE_PROVIDER_SITE_OTHER): Payer: Medicare Other

## 2022-07-18 VITALS — BP 187/81 | HR 103 | Resp 18

## 2022-07-18 DIAGNOSIS — I890 Lymphedema, not elsewhere classified: Secondary | ICD-10-CM

## 2022-07-18 DIAGNOSIS — I10 Essential (primary) hypertension: Secondary | ICD-10-CM

## 2022-07-18 DIAGNOSIS — M7989 Other specified soft tissue disorders: Secondary | ICD-10-CM

## 2022-07-18 DIAGNOSIS — E785 Hyperlipidemia, unspecified: Secondary | ICD-10-CM | POA: Diagnosis not present

## 2022-07-18 DIAGNOSIS — E1169 Type 2 diabetes mellitus with other specified complication: Secondary | ICD-10-CM

## 2022-07-23 ENCOUNTER — Ambulatory Visit: Payer: Medicare Other | Attending: Vascular Surgery | Admitting: Occupational Therapy

## 2022-07-23 DIAGNOSIS — I890 Lymphedema, not elsewhere classified: Secondary | ICD-10-CM | POA: Insufficient documentation

## 2022-07-23 NOTE — Therapy (Signed)
OUTPATIENT OCCUPATIONAL THERAPY EVALUATION: BILATERAL LOWER EXTREMITY LYMPHEDEMA   Patient Name: Elizabeth Rowland MRN: [id number] DOB:15-Feb-1941, 81 y.o., female Today's Date: 07/23/2022    OT End of Session - 07/23/22 1422     Visit Number 7    Number of Visits 36    Date for OT Re-Evaluation 09/23/22    OT Start Time 0215    OT Stop Time 0315    OT Time Calculation (min) 60 min    Activity Tolerance Patient tolerated treatment well;No increased pain    Behavior During Therapy [facility] for tasks assessed/performed               Past Medical History:  Diagnosis Date   Diabetes mellitus without complication [facility])    Past Surgical History:  Procedure Laterality Date   NO PAST SURGERIES     Patient Active Problem List   Diagnosis Date Noted   Lymphedema 05/28/2022   Swelling of limb 10/30/2021   Type 2 diabetes mellitus with hyperlipidemia ([place]) 02/01/2021   Pneumonia due to COVID-19 virus 01/28/2021   Hypomagnesemia 01/28/2021   Hypokalemia 01/28/2021   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[place]) 01/28/2021   Community acquired pneumonia    Lung mass    Acute respiratory failure with hypoxia ([place]) 01/07/2021   Acute respiratory distress syndrome (ARDS) due to COVID-19 virus ([place]) 01/07/2021   Pulmonary emboli ([place]) 01/07/2021   Severe sepsis with acute organ dysfunction ([place]) 01/06/2021   Hyperlipidemia 01/06/2021   Fibromyalgia 01/06/2021   Essential hypertension 01/06/2021    PCP: Elizabeth Merles, MD  REFERRING PROVIDER: Algernon Huxley, MD  REFERRING DIAG: I89.0  THERAPY DIAG:  Lymphedema, not elsewhere classified  Rationale for Evaluation and Treatment Rehabilitation  ONSET DATE: 06/25/22  [place] presents to OT for Intensive Phase CDT to RLE.  She is  accompanied by her son, Elizabeth Rowland. Pt arrives in manual transport with bandages in place and recommended post op shoe on R foot. Pt has no new complaints. Pain is 4/10 bilaterally.  PERTINENT HISTORY:  Hx recurrent cellulitis , Urinary incontinence, Severe BLE venous stasis, pulmonary fibrosis' on 02,  Hyperlipidemia Fibromyalgia Essential hypertension 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HCC) Severe sepsis with acute organ dysfunction (HCC) Acute respiratory failure with hypoxia (HCC) Acute respiratory distress syndrome (ARDS) due to COVID-19 virus [facility]) Pulmonary emboli ([place]) Community acquired pneumonia Lung mass Pneumonia due to COVID-19 virus Hypomagnesemia Hypokalemia Type 2  diabetes mellitus with hyperlipidemia (HCC) Swelling of limb Lymphedema  PAIN:  Are you having pain? Yes: NPRS scale: 4/10 Pain location: legs Pain description: tight,  Heavy , squeezing, burning Aggravating factors standing, dependent sitting Relieving factors: elevation   IMPAIRMENTS: difficulty walking, impaired transfers, increase risk of falls and recurrent infections, impaired endurance, decreased balance, generalized weakness, impaired cognition,   FUNCTIONAL LIMITATIONS: limited functional ambulation and mobility,increase risk of falls and recurrent infections, impaired basic and instrumental ADLs performance (needs assistance with all basic and instrumental ADLs) , impaired performance of productive activities and leisure pursuits, limited social participation  PRECAUTIONS: Fall and Other: 02, Lymphedema Precautions: PULMONARY , DIABETES  PRIOR LEVEL OF FUNCTION: Needs assistance with ADLs, Needs assistance with homemaking, Needs assistance with gait, Needs assistance with transfers, and Vocation/Vocational requirements:    PATIENT GOALS reduce swelling and limit progression per son, Elizabeth Rowland   OBJECTIVE  OBSERVATIONS / OTHER ASSESSMENTS:   LYMPHEDEMA ASSESSMENTS: Non-cancer related  FOTO OUTCOME SCORE: Intake  06/25/22 39/100  COMPARATIVE LIMB VOLUMETRICS: tba  OT Rx visit 1  LANDMARK Initial RIGHT  06/27/22  R LEG (A-D) 3737.5 ml  R THIGH (E-G)   R FULL LIMB (A-G)   Limb Volume differential (LVD)  7.4 %, R>L  Volume change since initial %  Volume change overall %  (Blank rows = not tested)  LANDMARK Initial LEFT 06/27/22  R LEG (A-D) 3461.5 ml  R THIGH (E-G)   R FULL LIMB (A-G)   Limb Volume differential (LVD)  %  Volume change since initial %  Volume change overall %  (Blank rows = not tested)  INFECTIONS:Hx Recurrent cellulitis  TODAY'S TREATMENT . Applied RLE multilayer compression wraps using 1 each 8, 10 and 12 cm wide x 5 meters long short stretch compression bandages over single layer of 4 mm thick x 5 meters long Rosidal Soft foam and cotton stockinett.  Skin care with low ph castor oil simultaneously with MLD to address stasis dermatitis and limit infection risk. MLD to RLE/RLQ utilizing functional inguinal watershed and proximal to distal J strokes.  PATIENT EDUCATION:  Education details: Continued Pt/ CG edu for lymphedema self care home program throughout session. Topics include outcome of comparative limb volumetrics- starting limb volume differentials (LVDs), technology and gradient techniques used for short stretch, multilayer compression wrapping, simple self-MLD, therapeutic lymphatic pumping exercises, skin/nail care, LE precautions,. compression garment recommendations and specifications, wear and care schedule and compression garment donning / doffing w assistive devices. Discussed progress towards all OT goals since commencing CDT. All questions answered to the Pt's satisfaction. Good return. Person educated: Patient and Child(ren) Education method: Explanation, Demonstration, Tactile cues, Verbal cues, and Handouts Education comprehension: verbalized understanding, returned demonstration, and needs further education   LYMPHEDEMA SELF CARE HOME PROGRAM: Multilayer  gradient compression wraps from base of toes to below the knee  23/7.  BLE Lymphatic Pumping Therex 10 reps each element, hold for 5 seconds, in order, 1 x daily, while in compression Daily skin care, bathing with soap and water and hydrating with low ph Eucerin cream Simple self-MLD daily  ASSESSMENT:  CLINICAL IMPRESSION: RLE swelling mildly increased today, as is redness and cobblestone texture and decreased skin mobility, compared with last visit. Pt states she was out and about in her wheelchair quite a bit yesterday (dependent positioning). She states she wore compression all day.  Cont as per POC.  OBJECTIVE IMPAIRMENTS Abnormal gait, cardiopulmonary status limiting activity, decreased activity tolerance, decreased balance, decreased cognition, decreased endurance, decreased  knowledge of condition, decreased knowledge of use of DME, decreased mobility, difficulty walking, decreased ROM, decreased strength, increased edema, impaired flexibility, postural dysfunction, obesity, and pain.   ACTIVITY LIMITATIONS standing, stairs, transfers, bed mobility, continence, bathing, toileting, dressing, and hygiene/grooming  PARTICIPATION LIMITATIONS: meal prep, cleaning, laundry, medication management, driving, shopping, community activity, occupation, yard work, school, and church  PERSONAL FACTORS Age, Past/current experiences, Time since onset of injury/illness/exacerbation, 1-2 co morbidities: Level of dependence on others for ADLs , including self care also affect patient's functional outcome.   REHAB POTENTIAL: Fair  with daily consistent Max assistance with all lymphedema self-care home program components- during Intensive Phase CDT and ongoing.    GOALS: Goals reviewed with patient/ caregiver? Yes  SHORT TERM GOALS: Target date: 07/10/2022    Pt will verbalize understanding of lymphedema precautions and prevention strategies and signs and symptoms of cellulitis infection with maximum  assistance from caregiver  (using printed reference to reduce risk of progression and to limit infection risk. Baseline:Dependent  Goal status: INITIAL  2.  With max caregiver assistance Pt will be able to don and doff multilayer, knee length, compression wraps using gradient techniques from base of toes to popliteal fossa ( one limb at a time) to decrease limb volume, to limit infection risk, and to limit lymphedema progression.  Baseline: Dependent Goal status: INITIAL   LONG TERM GOALS: Target date: 09/18/2022   Given this patient's Intake score of 39/100% on the functional outcomes FOTO tool, patient will experience an increase in function of 3 points  to improve basic and instrumental ADLs performance, including lymphedema self-care. Baseline: 39/100 Goal status: INITIAL  2.  With Max CG assistance for all home program  components throughout Intensive Phase CDT  Pt will achieve at least 10% limb volume reductions bilaterally below the knees to return limb to more normal size and shape,  to increase body symmetry for increased balance, to limit infection risk, to decrease pain, and to limit lymphedema progression. Baseline: Dependent Goal status: INITIAL  3.  With Maximum caregiver assistance Pt will be able to don and doff appropriate daytime compression garments to limit re-accumulation of lymphatic congestion and to limit infection risk and lymphedema progression Baseline: Dependent Goal status: INITIAL  4.  With Max caregiver assistance Pt will achieve and sustain no less than 85% compliance with all LE self-care home program components throughout CDT, including modified simple self-MLD, daily skin care and inspection, lymphatic pumping the ex and appropriate compression to limit lymphedema progression and to limit further functional decline. Baseline: Dependent Goal status: INITIAL   PLAN: OT FREQUENCY: 3x/week  OT DURATION: 12 weeks  PLANNED INTERVENTIONS: Therapeutic  exercises, Therapeutic activity, Patient/Family education, Self Care, DME instructions, Manual lymph drainage, Compression bandaging, Manual therapy, and compression garment/ device fitting, caregiver training,   PLAN FOR NEXT SESSION: OT Rx visit 1:  Commence RLW MLD. Apply RLE knee length compression wraps. Cont Pt and CG edu for LE self care home program..  Loel Dubonnet, MS, OTR/L, CLT-LANA 07/24/22 12:54 PM

## 2022-07-25 ENCOUNTER — Ambulatory Visit: Payer: Medicare Other | Admitting: Occupational Therapy

## 2022-07-25 DIAGNOSIS — I890 Lymphedema, not elsewhere classified: Secondary | ICD-10-CM

## 2022-07-25 NOTE — Therapy (Signed)
OUTPATIENT OCCUPATIONAL THERAPY TREATMENT NOTE BILATERAL LOWER EXTREMITY LYMPHEDEMA   Patient Name: Elizabeth Rowland MRN: [id number] DOB:02/22/1941, 81 y.o., female Today's Date: 07/25/2022    OT End of Session - 07/25/22 1413     Visit Number 8    Number of Visits 36    Date for OT Re-Evaluation 09/23/22    OT Start Time 0200    OT Stop Time 0300    OT Time Calculation (min) 60 min    Activity Tolerance Patient tolerated treatment well;No increased pain    Behavior During Therapy [facility] for tasks assessed/performed               Past Medical History:  Diagnosis Date   Diabetes mellitus without complication [facility]    Past Surgical History:  Procedure Laterality Date   NO PAST SURGERIES     Patient Active Problem List   Diagnosis Date Noted   Lymphedema 05/28/2022   Swelling of limb 10/30/2021   Type 2 diabetes mellitus with hyperlipidemia ([place]) 02/01/2021   Pneumonia due to COVID-19 virus 01/28/2021   Hypomagnesemia 01/28/2021   Hypokalemia 01/28/2021   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[place]) 01/28/2021   Community acquired pneumonia    Lung mass    Acute respiratory failure with hypoxia ([employer]) 01/07/2021   Acute respiratory distress syndrome (ARDS) due to COVID-19 virus ([place]) 01/07/2021   Pulmonary emboli ([place]) 01/07/2021   Severe sepsis with acute organ dysfunction ([place]) 01/06/2021   Hyperlipidemia 01/06/2021   Fibromyalgia 01/06/2021   Essential hypertension 01/06/2021    PCP: Marguerita Merles, MD  REFERRING PROVIDER: Algernon Huxley, MD  REFERRING DIAG: I89.0  THERAPY DIAG:  Lymphedema, not elsewhere classified  Rationale for Evaluation and Treatment Rehabilitation  ONSET DATE: 06/25/22  [place] presents to OT for Intensive Phase CDT to RLE.  She  is accompanied by her son, [name]. Pt arrives in manual transport with bandages in place and recommended post op shoe on R foot. Pt has no new complaints. Pain is 4/10 bilaterally.  PERTINENT HISTORY:  Hx recurrent cellulitis , Urinary incontinence, Severe BLE venous stasis, pulmonary fibrosis' on 02,  Hyperlipidemia Fibromyalgia Essential hypertension 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HCC) Severe sepsis with acute organ dysfunction (HCC) Acute respiratory failure with hypoxia (HCC) Acute respiratory distress syndrome (ARDS) due to COVID-19 virus [facility]) Pulmonary emboli ([place]) Community acquired pneumonia Lung mass Pneumonia due to COVID-19 virus Hypomagnesemia Hypokalemia Type  2 diabetes mellitus with hyperlipidemia (HCC) Swelling of limb Lymphedema  PAIN:  Are you having pain? Yes: NPRS scale: 4/10 Pain location: legs Pain description: tight,  Heavy , squeezing, burning Aggravating factors standing, dependent sitting Relieving factors: elevation   IMPAIRMENTS: difficulty walking, impaired transfers, increase risk of falls and recurrent infections, impaired endurance, decreased balance, generalized weakness, impaired cognition,   FUNCTIONAL LIMITATIONS: limited functional ambulation and mobility,increase risk of falls and recurrent infections, impaired basic and instrumental ADLs performance (needs assistance with all basic and instrumental ADLs) , impaired performance of productive activities and leisure pursuits, limited social participation  PRECAUTIONS: Fall and Other: 02, Lymphedema Precautions: PULMONARY , DIABETES  PRIOR LEVEL OF FUNCTION: Needs assistance with ADLs, Needs assistance with homemaking, Needs assistance with gait, Needs assistance with transfers, and Vocation/Vocational requirements:    PATIENT GOALS reduce swelling and limit progression per son, [name]   OBJECTIVE  OBSERVATIONS / OTHER ASSESSMENTS:   LYMPHEDEMA ASSESSMENTS: Non-cancer related  FOTO OUTCOME SCORE: Intake  06/25/22 39/100  COMPARATIVE LIMB VOLUMETRICS: tba  OT Rx visit 1  LANDMARK Initial RIGHT  06/27/22  R LEG (A-D) 3737.5 ml  R THIGH (E-G)   R FULL LIMB (A-G)   Limb Volume differential (LVD)  7.4 %, R>L  Volume change since initial %  Volume change overall %  (Blank rows = not tested)  LANDMARK Initial LEFT 06/27/22  R LEG (A-D) 3461.5 ml  R THIGH (E-G)   R FULL LIMB (A-G)   Limb Volume differential (LVD)  %  Volume change since initial %  Volume change overall %  (Blank rows = not tested)  INFECTIONS:Hx Recurrent cellulitis  TODAY'S TREATMENT . Applied RLE multilayer compression wraps using 1 each 8, 10 and 12 cm wide x 5 meters long short stretch compression bandages over single layer of 4 mm thick x 5 meters long Rosidal Soft foam and cotton stockinett.  Skin care with low ph castor oil simultaneously with MLD to address stasis dermatitis and limit infection risk. MLD to RLE/RLQ utilizing functional inguinal watershed and proximal to distal J strokes.  PATIENT EDUCATION:  Education details: Continued Pt/ CG edu for lymphedema self care home program throughout session. Topics include outcome of comparative limb volumetrics- starting limb volume differentials (LVDs), technology and gradient techniques used for short stretch, multilayer compression wrapping, simple self-MLD, therapeutic lymphatic pumping exercises, skin/nail care, LE precautions,. compression garment recommendations and specifications, wear and care schedule and compression garment donning / doffing w assistive devices. Discussed progress towards all OT goals since commencing CDT. All questions answered to the Pt's satisfaction. Good return. Person educated: Patient and Child(ren) Education method: Explanation, Demonstration, Tactile cues, Verbal cues, and Handouts Education comprehension: verbalized understanding, returned demonstration, and needs further education   LYMPHEDEMA SELF CARE HOME PROGRAM: Multilayer  gradient compression wraps from base of toes to below the knee  23/7.  BLE Lymphatic Pumping Therex 10 reps each element, hold for 5 seconds, in order, 1 x daily, while in compression Daily skin care, bathing with soap and water and hydrating with low ph Eucerin cream Simple self-MLD daily  ASSESSMENT:  CLINICAL IMPRESSION: Pt's RLE is well decongested and nearly ready to fit with a garment. Skin condition is mildly improved, but indurration necessary for optimal lymphatic function remains severe. Pt tolerated MLD, skin carde and compression wraps to RLE today without pain. Her son continues to diligently assist her with wrapping and skin care between sessions. Cont as per POC.  OBJECTIVE IMPAIRMENTS Abnormal gait, cardiopulmonary status  limiting activity, decreased activity tolerance, decreased balance, decreased cognition, decreased endurance, decreased knowledge of condition, decreased knowledge of use of DME, decreased mobility, difficulty walking, decreased ROM, decreased strength, increased edema, impaired flexibility, postural dysfunction, obesity, and pain.   ACTIVITY LIMITATIONS standing, stairs, transfers, bed mobility, continence, bathing, toileting, dressing, and hygiene/grooming  PARTICIPATION LIMITATIONS: meal prep, cleaning, laundry, medication management, driving, shopping, community activity, occupation, yard work, school, and church  PERSONAL FACTORS Age, Past/current experiences, Time since onset of injury/illness/exacerbation, 1-2 co morbidities: Level of dependence on others for ADLs , including self care also affect patient's functional outcome.   REHAB POTENTIAL: Fair  with daily consistent Max assistance with all lymphedema self-care home program components- during Intensive Phase CDT and ongoing.    GOALS: Goals reviewed with patient/ caregiver? Yes  SHORT TERM GOALS: Target date: 07/10/2022    Pt will verbalize understanding of lymphedema precautions and prevention  strategies and signs and symptoms of cellulitis infection with maximum assistance from caregiver  (using printed reference to reduce risk of progression and to limit infection risk. Baseline:Dependent  Goal status: 07/23/22 GOAL MET  2.  With max caregiver assistance Pt will be able to don and doff multilayer, knee length, compression wraps using gradient techniques from base of toes to popliteal fossa ( one limb at a time) to decrease limb volume, to limit infection risk, and to limit lymphedema progression.  Baseline: Dependent Goal status: 07/23/22 GOAL MET   LONG TERM GOALS: Target date: 09/18/2022   Given this patient's Intake score of 39/100% on the functional outcomes FOTO tool, patient will experience an increase in function of 3 points  to improve basic and instrumental ADLs performance, including lymphedema self-care. Baseline: 39/100 Goal status: INITIAL  2.  With Max CG assistance for all home program  components throughout Intensive Phase CDT  Pt will achieve at least 10% limb volume reductions bilaterally below the knees to return limb to more normal size and shape,  to increase body symmetry for increased balance, to limit infection risk, to decrease pain, and to limit lymphedema progression. Baseline: Dependent Goal status: INITIAL  3.  With Maximum caregiver assistance Pt will be able to don and doff appropriate daytime compression garments to limit re-accumulation of lymphatic congestion and to limit infection risk and lymphedema progression Baseline: Dependent Goal status: 07/23/22 Ongoing  4.  With Max caregiver assistance Pt will achieve and sustain no less than 85% compliance with all LE self-care home program components throughout CDT, including modified simple self-MLD, daily skin care and inspection, lymphatic pumping the ex and appropriate compression to limit lymphedema progression and to limit further functional decline. Baseline: Dependent Goal status:  INITIAL   PLAN: OT FREQUENCY: 3x/week  OT DURATION: 12 weeks  PLANNED INTERVENTIONS: Therapeutic exercises, Therapeutic activity, Patient/Family education, Self Care, DME instructions, Manual lymph drainage, Compression bandaging, Manual therapy, and compression garment/ device fitting, caregiver training,   PLAN FOR NEXT SESSION: OT Rx visit 1:  Commence RLW MLD. Apply RLE knee length compression wraps. Cont Pt and CG edu for LE self care home program..  Andrey Spearman, MS, OTR/L, CLT-LANA 07/25/22 4:07 PM

## 2022-07-29 ENCOUNTER — Encounter (INDEPENDENT_AMBULATORY_CARE_PROVIDER_SITE_OTHER): Payer: Self-pay | Admitting: Nurse Practitioner

## 2022-07-29 NOTE — Progress Notes (Signed)
Subjective:    Patient ID: Elizabeth Rowland, female    DOB: 1940/12/15, 81 y.o.   MRN: [id number] Chief Complaint  Patient presents with   Follow-up    Ultrasound follow up    Elizabeth Rowland is a 81 y.o. female.  I am asked to see the patient by Dr. Sampson Goon for evaluation of her severe lymphedema of both lower extremities.  Patient was seen earlier this year and prescribed compression socks which they were unable to obtain that would fit her.  Her leg swelling has become progressively worse.  Dr. Sampson Goon had her and what sounds like Unna boots 4 weeks and she came out about a month ago.  She has severe dermal thickening bilaterally has had recurrent cellulitic episodes.  She has a previous history of DVT and there is a known postphlebitic component to this as well.  The patient has severe cardiopulmonary disease and is on chronic oxygen and very mobile at this point.  Her legs are dependent much of the time.  There is no current skin weeping or open wounds but her skin is very thickened and swollen.  Both legs are quite swollen and pronounced.  They are heavy and tired.  No fevers or chills.  She remains on anticoagulation.  She is also had 2 COVID infections that have been very debilitating for her as well.      Review of Systems  Cardiovascular:  Positive for leg swelling.  All other systems reviewed and are negative.      Objective:   Physical Exam Vitals reviewed.  HENT:     Head: Normocephalic.  Pulmonary:     Effort: Pulmonary effort is normal.  Musculoskeletal:     Right lower leg: Edema present.     Left lower leg: Edema present.  Skin:    General: Skin is warm and dry.  Neurological:     Mental Status: [name] and oriented to person, place, and time.     Motor: Weakness present.     Gait: Gait abnormal.  Psychiatric:        Mood and Affect: Mood normal.        Behavior: Behavior normal.        Thought Content: Thought content normal.         Judgment: Judgment normal.     BP (!) 187/81 (BP Location: Left Arm)   Pulse (!) 103   Resp 18   LMP  (LMP Unknown)   Past Medical History:  Diagnosis Date   Diabetes mellitus without complication (HCC)     Social History   Socioeconomic History   Marital status: Married    Spouse name: Not on file   Number of children: Not on file   Years of education: Not on file   Highest education level: Not on file  Occupational History   Not on file  Tobacco Use   Smoking status: Never   Smokeless tobacco: Never  Substance and Sexual Activity   Alcohol use: Never   Drug use: Never   Sexual activity: Not Currently  Other Topics Concern   Not on file  Social History Narrative   Not on file   Social Determinants of Health   Financial Resource Strain: Not on file  Food Insecurity: Not on file  Transportation Needs: Not on file  Physical Activity: Not on file  Stress: Not on file  Social Connections: Not on file  Intimate Partner Violence: Not on file  Past Surgical History:  Procedure Laterality Date   NO PAST SURGERIES      Family History  Problem Relation Age of Onset   Stroke Mother     Not on File     Latest Ref Rng & Units 02/01/2021    5:20 AM 01/31/2021    6:26 AM 01/30/2021    4:32 AM  CBC  WBC 4.0 - 10.5 K/uL 21.0  23.1  22.3   Hemoglobin 12.0 - 15.0 g/dL 66.0  63.0  16.0   Hematocrit 36.0 - 46.0 % 45.3  43.4  41.1   Platelets 150 - 400 K/uL 115  109  88       CMP     Component Value Date/Time   NA 137 02/01/2021 0520   K 4.4 02/01/2021 0520   CL 100 02/01/2021 0520   CO2 29 02/01/2021 0520   GLUCOSE 147 (H) 02/01/2021 0520   BUN 24 (H) 02/01/2021 0520   CREATININE 0.90 11/29/2021 1012   CALCIUM 8.3 (L) 02/01/2021 0520   PROT 5.4 (L) 02/01/2021 0520   ALBUMIN 2.6 (L) 02/01/2021 0520   AST 26 02/01/2021 0520   ALT 50 (H) 02/01/2021 0520   ALKPHOS 55 02/01/2021 0520   BILITOT 0.7 02/01/2021 0520   GFRNONAA >60 02/01/2021 0520     No  results found.     Assessment & Plan:   1. Lymphedema The patient has been working with lymphedema clinic and making excellent progress.  The patient will continue with the lymphedema clinic at this time.  We will have her return in 6 weeks to continue to follow closely with her progress.  2. Essential hypertension Continue antihypertensive medications as already ordered, these medications have been reviewed and there are no changes at this time.  3. Type 2 diabetes mellitus with hyperlipidemia (HCC) Continue hypoglycemic medications as already ordered, these medications have been reviewed and there are no changes at this time.  Hgb A1C to be monitored as already arranged by primary service   Current Outpatient Medications on File Prior to Visit  Medication Sig Dispense Refill   apixaban (ELIQUIS) 5 MG TABS tablet Take 1 tablet (5 mg total) by mouth 2 (two) times daily. 60 tablet 3   gabapentin (NEURONTIN) 300 MG capsule Take 1 capsule by mouth 3 (three) times daily.     insulin glargine (LANTUS) 100 UNIT/ML injection Inject 0.1 mLs (10 Units total) into the skin 2 (two) times daily. 10 mL 11   potassium chloride SA (KLOR-CON) 20 MEQ tablet Take 1 tablet (20 mEq total) by mouth daily.  0   pravastatin (PRAVACHOL) 10 MG tablet Take 10 mg by mouth daily.     traMADol (ULTRAM) 50 MG tablet Take 50 mg by mouth every 4 (four) hours as needed.     docusate sodium (COLACE) 100 MG capsule Take 100 mg by mouth 2 (two) times daily. (Patient not taking: Reported on 10/30/2021)     furosemide (LASIX) 40 MG tablet Take 1 tablet (40 mg total) by mouth daily. 30 tablet 11   guaiFENesin-dextromethorphan (ROBITUSSIN DM) 100-10 MG/5ML syrup Take 10 mLs by mouth every 4 (four) hours as needed for cough. (Patient not taking: Reported on 10/30/2021) 118 mL 0   insulin aspart (NOVOLOG) 100 UNIT/ML injection Inject 0-20 Units into the skin 3 (three) times daily with meals. (Patient not taking: Reported on  10/30/2021) 10 mL 11   insulin aspart (NOVOLOG) 100 UNIT/ML injection Inject 0-5 Units into the skin at bedtime. (  Patient not taking: Reported on 10/30/2021) 10 mL 11   Multiple Vitamin (MULTIVITAMIN WITH MINERALS) TABS tablet Take 1 tablet by mouth daily. (Patient not taking: Reported on 10/30/2021) 30 tablet 3   nystatin (MYCOSTATIN) 100000 UNIT/ML suspension Take 5 mLs (500,000 Units total) by mouth 4 (four) times daily. X 10 days (Patient not taking: Reported on 10/30/2021) 60 mL 0   polyethylene glycol (MIRALAX / GLYCOLAX) 17 g packet Take 17 g by mouth daily as needed for mild constipation, moderate constipation or severe constipation. (Patient not taking: Reported on 10/30/2021)     No current facility-administered medications on file prior to visit.    There are no Patient Instructions on file for this visit. No follow-ups on file.   Georgiana Spinner, NP

## 2022-07-30 ENCOUNTER — Ambulatory Visit: Payer: Medicare Other | Admitting: Occupational Therapy

## 2022-08-01 ENCOUNTER — Ambulatory Visit: Payer: Medicare Other | Admitting: Occupational Therapy

## 2022-08-05 ENCOUNTER — Ambulatory Visit: Payer: Medicare Other | Admitting: Occupational Therapy

## 2022-08-06 ENCOUNTER — Ambulatory Visit: Payer: Medicare Other | Admitting: Occupational Therapy

## 2022-08-12 ENCOUNTER — Ambulatory Visit: Payer: Medicare Other | Admitting: Occupational Therapy

## 2022-08-12 DIAGNOSIS — I890 Lymphedema, not elsewhere classified: Secondary | ICD-10-CM | POA: Diagnosis not present

## 2022-08-12 NOTE — Therapy (Signed)
OUTPATIENT OCCUPATIONAL THERAPY TREATMENT NOTE BILATERAL LOWER EXTREMITY LYMPHEDEMA   Patient Name: ELKY FUNCHES MRN: [id number] DOB:[birth year], 81 y.o., female Today's Date: 08/12/2022    OT End of Session - 08/12/22 1434     Visit Number 9    Number of Visits 5    Date for OT Re-Evaluation 09/23/22    OT Start Time 0235    OT Stop Time 0405    OT Time Calculation (min) 90 min    Activity Tolerance Other (comment)   SOB with transfers   Behavior During Therapy [facility] for tasks assessed/performed               Past Medical History:  Diagnosis Date   Diabetes mellitus without complication ([place])    Past Surgical History:  Procedure Laterality Date   NO PAST SURGERIES     Patient Active Problem List   Diagnosis Date Noted   Lymphedema 05/28/2022   Swelling of limb 10/30/2021   Type 2 diabetes mellitus with hyperlipidemia ([place]) 02/01/2021   Pneumonia due to COVID-19 virus 01/28/2021   Hypomagnesemia 01/28/2021   Hypokalemia 01/28/2021   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[place]) 01/28/2021   Community acquired pneumonia    Lung mass    Acute respiratory failure with hypoxia ([place]) 01/07/2021   Acute respiratory distress syndrome (ARDS) due to COVID-19 virus ([place]) 01/07/2021   Pulmonary emboli ([place]) 01/07/2021   Severe sepsis with acute organ dysfunction ([place]) 01/06/2021   Hyperlipidemia 01/06/2021   Fibromyalgia 01/06/2021   Essential hypertension 01/06/2021    PCP: Marguerita Merles, MD  REFERRING PROVIDER: Algernon Huxley, MD  REFERRING DIAG: I89.0  THERAPY DIAG:  Lymphedema, not elsewhere classified  Rationale for Evaluation and Treatment Rehabilitation  ONSET DATE: 06/25/22  Madison presents to OT for Intensive Phase CDT to RLE.  She is accompanied  by her son, [name]. She was last seen on 11/9. She missed 2 x weekly scheduled appointments due to first, a bad cold and, second, a pressure wound on her sacrum. Pt arrives in manual transport with bandages in place and recommended post op shoe on R foot. Pt has no new complaints. Pain is 4/10 bilaterally.  PERTINENT HISTORY:  Hx recurrent cellulitis , Urinary incontinence, Severe BLE venous stasis, pulmonary fibrosis' on 02,  Hyperlipidemia Fibromyalgia Essential hypertension 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HCC) Severe sepsis with acute organ dysfunction (HCC) Acute respiratory failure with hypoxia (  [place]) Acute respiratory distress syndrome (ARDS) due to COVID-19 virus [facility]) Pulmonary emboli ([place]) Community acquired pneumonia Lung mass Pneumonia due to COVID-19 virus Hypomagnesemia Hypokalemia Type 2 diabetes mellitus with hyperlipidemia (HCC) Swelling of limb Lymphedema  PAIN:  Are you having pain? Yes: NPRS scale: 4/10 Pain location: legs Pain description: tight,  Heavy , squeezing, burning Aggravating factors standing, dependent sitting Relieving factors: elevation   IMPAIRMENTS: difficulty walking, impaired transfers, increase risk of falls and recurrent infections, impaired endurance, decreased balance, generalized weakness, impaired cognition,   FUNCTIONAL LIMITATIONS: limited functional ambulation and mobility,increase risk of falls and recurrent infections, impaired basic and instrumental ADLs performance (needs assistance with all basic and instrumental ADLs) , impaired performance of productive activities and leisure pursuits, limited social participation  PRECAUTIONS: Fall and Other: 02, Lymphedema Precautions: PULMONARY , DIABETES  PRIOR LEVEL OF FUNCTION: Needs assistance with ADLs, Needs assistance with homemaking, Needs assistance with gait, Needs assistance with transfers, and Vocation/Vocational requirements:    PATIENT GOALS reduce swelling and limit progression per son,  [name]   OBJECTIVE  OBSERVATIONS / OTHER ASSESSMENTS:   LYMPHEDEMA ASSESSMENTS: Non-cancer related  FOTO OUTCOME SCORE: Intake 06/25/22 39/100  COMPARATIVE LIMB VOLUMETRICS:    LANDMARK RIGHT 9th visit 08/12/22  R LEG (A-D) 3357.1 ml  R THIGH (E-G)   R FULL LIMB (A-G)   Limb Volume differential (LVD)  %  Volume change since initial DECREASED % 10.18% since initial  Volume change overall %    LANDMARK Initial RIGHT  06/27/22  R LEG (A-D) 3737.5 ml  R THIGH (E-G)   R FULL LIMB (A-G)   Limb Volume differential (LVD)  7.4 %, R>L  Volume change since initial %  Volume change overall %  (Blank rows = not tested)  LANDMARK Initial LEFT 06/27/22  R LEG (A-D) 3461.5 ml  R THIGH (E-G)   R FULL LIMB (A-G)   Limb Volume differential (LVD)  %  Volume change since initial %  Volume change overall %  (Blank rows = not tested)  INFECTIONS:Hx Recurrent cellulitis  TODAY'S TREATMENT . RLE Compartive limb voumetrics Anatomical measurements for custom RLE compression garment and HOS device Applied RLE multilayer compression wraps using 1 each 8, 10 and 12 cm wide x 5 meters long short stretch compression bandages over single layer of 4 mm thick x 5 meters long Rosidal Soft foam and cotton stockinett.   PATIENT EDUCATION:  Education details: Continued Pt/ CG edu for lymphedema self care home program throughout session. Topics include outcome of comparative limb volumetrics- starting limb volume differentials (LVDs), technology and gradient techniques used for short stretch, multilayer compression wrapping, simple self-MLD, therapeutic lymphatic pumping exercises, skin/nail care, LE precautions,. compression garment recommendations and specifications, wear and care schedule and compression garment donning / doffing w assistive devices. Discussed progress towards all OT goals since commencing CDT. All questions answered to the Pt's satisfaction. Good return. Person educated: Patient and  Child(ren) Education method: Explanation, Demonstration, Tactile cues, Verbal cues, and Handouts Education comprehension: verbalized understanding, returned demonstration, and needs further education   LYMPHEDEMA SELF CARE HOME PROGRAM: Multilayer gradient compression wraps from base of toes to below the knee  23/7.  BLE Lymphatic Pumping Therex 10 reps each element, hold for 5 seconds, in order, 1 x daily, while in compression Daily skin care, bathing with soap and water and hydrating with low ph Eucerin cream Simple self-MLD daily  ASSESSMENT:  CLINICAL IMPRESSION: Pt's RLE is well decongested and ready to fit with a garment.  Skin condition is mildly improved, but indurration necessary for optimal lymphatic function remains severe. Pt tolerated MLD, skin carde and compression wraps to RLE today without pain. Her son continues to diligently assist her with wrapping and skin care between sessions. Limb volumetrics done in prep for progress note reveal 10.18% limb volume reduction below the knee since initially measured on 06/27/22. GOAL MET. Completed measurements for RLE Elvarex cassic knee high, mccl 2, open toe, slat open toe, oblique top edge, and Jobst RELAX HOS device. Fit asap then commence LLE CDT. Cont as per POC.  OBJECTIVE IMPAIRMENTS Abnormal gait, cardiopulmonary status limiting activity, decreased activity tolerance, decreased balance, decreased cognition, decreased endurance, decreased knowledge of condition, decreased knowledge of use of DME, decreased mobility, difficulty walking, decreased ROM, decreased strength, increased edema, impaired flexibility, postural dysfunction, obesity, and pain.   ACTIVITY LIMITATIONS standing, stairs, transfers, bed mobility, continence, bathing, toileting, dressing, and hygiene/grooming  PARTICIPATION LIMITATIONS: meal prep, cleaning, laundry, medication management, driving, shopping, community activity, occupation, yard work, school, and  church  PERSONAL FACTORS Age, Past/current experiences, Time since onset of injury/illness/exacerbation, 1-2 co morbidities: Level of dependence on others for ADLs , including self care also affect patient's functional outcome.   REHAB POTENTIAL: Fair  with daily consistent Max assistance with all lymphedema self-care home program components- during Intensive Phase CDT and ongoing.    GOALS: Goals reviewed with patient/ caregiver? Yes  SHORT TERM GOALS: Target date: 07/10/2022    Pt will verbalize understanding of lymphedema precautions and prevention strategies and signs and symptoms of cellulitis infection with maximum assistance from caregiver  (using printed reference to reduce risk of progression and to limit infection risk. Baseline:Dependent  Goal status: 07/23/22 GOAL MET  2.  With max caregiver assistance Pt will be able to don and doff multilayer, knee length, compression wraps using gradient techniques from base of toes to popliteal fossa ( one limb at a time) to decrease limb volume, to limit infection risk, and to limit lymphedema progression.  Baseline: Dependent Goal status: 07/23/22 GOAL MET   LONG TERM GOALS: Target date: 09/18/2022   Given this patient's Intake score of 39/100% on the functional outcomes FOTO tool, patient will experience an increase in function of 3 points  to improve basic and instrumental ADLs performance, including lymphedema self-care. Baseline: 39/100 Goal status: INITIAL  2.  With Max CG assistance for all home program  components throughout Intensive Phase CDT  Pt will achieve at least 10% limb volume reductions bilaterally below the knees to return limb to more normal size and shape,  to increase body symmetry for increased balance, to limit infection risk, to decrease pain, and to limit lymphedema progression. Baseline: Dependent Goal status: INITIAL  3.  With Maximum caregiver assistance Pt will be able to don and doff appropriate daytime  compression garments to limit re-accumulation of lymphatic congestion and to limit infection risk and lymphedema progression Baseline: Dependent Goal status: 07/23/22 Ongoing  4.  With Max caregiver assistance Pt will achieve and sustain no less than 85% compliance with all LE self-care home program components throughout CDT, including modified simple self-MLD, daily skin care and inspection, lymphatic pumping the ex and appropriate compression to limit lymphedema progression and to limit further functional decline. Baseline: Dependent Goal status: INITIAL   PLAN: OT FREQUENCY: 3x/week  OT DURATION: 12 weeks  PLANNED INTERVENTIONS: Therapeutic exercises, Therapeutic activity, Patient/Family education, Self Care, DME instructions, Manual lymph drainage, Compression bandaging, Manual therapy, and compression garment/ device fitting, caregiver training,  PLAN FOR NEXT SESSION: OT Rx visit 1:  Commence RLW MLD. Apply RLE knee length compression wraps. Cont Pt and CG edu for LE self care home program.. Andrey Spearman, MS, OTR/L, CLT-LANA 08/12/22 4:13 PM

## 2022-08-13 ENCOUNTER — Ambulatory Visit: Payer: Medicare Other | Admitting: Occupational Therapy

## 2022-08-15 ENCOUNTER — Ambulatory Visit: Payer: Medicare Other | Admitting: Occupational Therapy

## 2022-08-19 ENCOUNTER — Ambulatory Visit: Payer: Medicare Other | Admitting: Occupational Therapy

## 2022-08-20 ENCOUNTER — Ambulatory Visit: Payer: Medicare Other | Attending: Vascular Surgery | Admitting: Occupational Therapy

## 2022-08-20 DIAGNOSIS — I890 Lymphedema, not elsewhere classified: Secondary | ICD-10-CM | POA: Diagnosis present

## 2022-08-20 NOTE — Therapy (Signed)
OUTPATIENT OCCUPATIONAL THERAPY TREATMENT NOTE & PROGRESS REPORT BILATERAL LOWER EXTREMITY LYMPHEDEMA   Patient Name: Elizabeth Rowland MRN: [id number] DOB:1941-06-23, 81 y.o., female Today's Date: 08/20/2022  Reporting Period: 06/25/22 - 08/20/22    OT End of Session - 08/20/22 1421     Visit Number 10    Number of Visits 51    Date for OT Re-Evaluation 09/23/22    Activity Tolerance Other (comment)   SOB with transfers   Behavior During Therapy [facility] for tasks assessed/performed               Past Medical History:  Diagnosis Date   Diabetes mellitus without complication ([place])    Past Surgical History:  Procedure Laterality Date   NO PAST SURGERIES     Patient Active Problem List   Diagnosis Date Noted   Lymphedema 05/28/2022   Swelling of limb 10/30/2021   Type 2 diabetes mellitus with hyperlipidemia ([place]) 02/01/2021   Pneumonia due to COVID-19 virus 01/28/2021   Hypomagnesemia 01/28/2021   Hypokalemia 01/28/2021   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[place]) 01/28/2021   Community acquired pneumonia    Lung mass    Acute respiratory failure with hypoxia ([place]) 01/07/2021   Acute respiratory distress syndrome (ARDS) due to COVID-19 virus ([place]) 01/07/2021   Pulmonary emboli ([place]) 01/07/2021   Severe sepsis with acute organ dysfunction ([place]) 01/06/2021   Hyperlipidemia 01/06/2021   Fibromyalgia 01/06/2021   Essential hypertension 01/06/2021    PCP: Marguerita Merles, MD  REFERRING PROVIDER: Algernon Huxley, MD  REFERRING DIAG: I89.0  THERAPY DIAG:  Lymphedema, not elsewhere classified  Rationale for Evaluation and Treatment Rehabilitation  ONSET DATE: 06/25/22  Raywick presents to OT for Intensive Phase CDT to RLE.  She is accompanied by her son, [name]. Pt  arrives in manual transport with bandages in place and recommended post op shoe on R foot. Pt rates LE-related pain in her legs as 6/10 bilaterally.   PERTINENT HISTORY:  Hx recurrent cellulitis , Urinary incontinence, Severe BLE venous stasis, pulmonary fibrosis' on 02,  Hyperlipidemia Fibromyalgia Essential hypertension 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HCC) Severe sepsis with acute organ dysfunction (HCC) Acute respiratory failure with hypoxia (HCC) Acute respiratory distress syndrome (ARDS) due to COVID-19 virus ([place]) Pulmonary emboli ([place]) Community acquired pneumonia Lung mass Pneumonia due to COVID-19 virus Hypomagnesemia Hypokalemia Type 2 diabetes mellitus with hyperlipidemia (HCC) Swelling of limb Lymphedema  PAIN:  Are you having pain? Yes: NPRS scale: 6/10 Pain location: legs Pain description: tight,  Heavy , squeezing, burning Aggravating factors standing, dependent sitting Relieving factors: elevation   IMPAIRMENTS: difficulty walking, impaired transfers, increase risk of falls and recurrent infections, impaired endurance, decreased balance, generalized weakness, impaired cognition,   FUNCTIONAL LIMITATIONS: limited functional ambulation and mobility,increase risk of falls and recurrent infections, impaired basic and instrumental ADLs performance (needs assistance with all basic and instrumental ADLs) , impaired performance of productive activities and leisure pursuits, limited social participation  PRECAUTIONS: Fall and Other: 02, Lymphedema Precautions: PULMONARY , DIABETES  PRIOR LEVEL OF FUNCTION: Needs assistance with ADLs, Needs assistance with homemaking, Needs assistance with gait, Needs assistance with transfers, and Vocation/Vocational requirements:    PATIENT GOALS reduce swelling and limit progression per son, [name]   OBJECTIVE  OBSERVATIONS / OTHER ASSESSMENTS:   LYMPHEDEMA ASSESSMENTS: Non-cancer related  FOTO OUTCOME SCORE: Intake 06/25/22 39/100  COMPARATIVE  LIMB VOLUMETRICS:    LANDMARK RIGHT 9th visit 08/12/22  R LEG (A-D) 3357.1 ml  R THIGH (E-G)   R FULL LIMB (A-G)   Limb Volume differential (LVD)  %  Volume change since initial DECREASED % 10.18% since initial GOAL MET  Volume change overall %    LANDMARK Initial RIGHT  06/27/22  R LEG (A-D) 3737.5 ml  R THIGH (E-G)   R FULL LIMB (A-G)   Limb Volume differential (LVD)  7.4 %, R>L  Volume change since initial %  Volume change overall %  (Blank rows = not tested)  LANDMARK Initial LEFT 06/27/22  R LEG (A-D) 3461.5 ml  R THIGH (E-G)   R FULL LIMB (A-G)   Limb Volume differential (LVD)  %  Volume change since initial %  Volume change overall %  (Blank rows = not tested)  INFECTIONS:Hx Recurrent cellulitis  TODAY'S TREATMENT . Pt EDU and reviewed progress towards all OT goals. Applied RLE multilayer compression wraps using two 10 cm wide short stretch compression bandages over single layer of 4 mm thick x 5 meters long Rosidal Soft foam and cotton stockinett.  MLD and simultaneous skin care to RLE as established.  PATIENT EDUCATION:  Education details: Continued Pt/ CG edu for lymphedema self care home program throughout session. Topics include outcome of comparative limb volumetrics- starting limb volume differentials (LVDs), technology and gradient techniques used for short stretch, multilayer compression wrapping, simple self-MLD, therapeutic lymphatic pumping exercises, skin/nail care, LE precautions,. compression garment recommendations and specifications, wear and care schedule and compression garment donning / doffing w assistive devices. Discussed progress towards all OT goals since commencing CDT. All questions answered to the Pt's satisfaction. Good return. Person educated: Patient and Child(ren) Education method: Explanation, Demonstration, Tactile cues, Verbal cues, and Handouts Education comprehension: verbalized understanding, returned demonstration, and needs  further education   LYMPHEDEMA SELF CARE HOME PROGRAM: Multilayer gradient compression wraps from base of toes to below the knee  23/7.  BLE Lymphatic Pumping Therex 10 reps each element, hold for 5 seconds, in order, 1 x daily, while in compression Daily skin care, bathing with soap and water and hydrating with low ph Eucerin cream Simple self-MLD daily  ASSESSMENT:  CLINICAL IMPRESSION: Pls see LONG and SHORT TERM GOALS sections for additional details re  clinical gains to date. In summary, Pt has made steady progress towards all OT goals for lymphedema care. Limb volume in the R leg is  reduced within goal range. Her son has learned to bandage her leg using correct gradient  technique, and he is diligent with assisting her with skin care between visits. Skin condition on the R leg continues to improve, despite stubborn induration. Custom compression garments for RLE are ordered and awaiting delivery for fitting before moving on the LLE Rx. Pt tolerated MLD, skin care and multilayer compression wrapping today without increased pain. Cont as per POC.  CUSTOM COMPRESSION GARMENTS & DEVICES:   BLE Elvarex cassic knee high, mccl 2, open toe, slat open toe, oblique top edge, and Jobst RELAX, ccl 2, convoluted HOS device.   OBJECTIVE IMPAIRMENTS Abnormal gait, cardiopulmonary status limiting activity, decreased activity tolerance, decreased balance, decreased cognition, decreased endurance, decreased knowledge of condition, decreased knowledge of use of DME, decreased mobility, difficulty walking, decreased ROM, decreased strength, increased edema, impaired flexibility, postural dysfunction, obesity, and pain.   ACTIVITY LIMITATIONS standing, stairs, transfers, bed mobility, continence, bathing, toileting, dressing, and hygiene/grooming  PARTICIPATION LIMITATIONS: meal prep, cleaning, laundry, medication management, driving, shopping, community activity, occupation, yard work, school, and  church  PERSONAL FACTORS Age, Past/current experiences, Time since onset of injury/illness/exacerbation, 1-2 co morbidities: Level of dependence on others for ADLs , including self care also affect patient's functional outcome.   REHAB POTENTIAL: Fair  with daily consistent Max assistance with all lymphedema self-care home program components- during Intensive Phase CDT and ongoing.    GOALS: Goals reviewed with patient/ caregiver? Yes  SHORT TERM GOALS: Target date: 07/10/2022    Pt will verbalize understanding of lymphedema precautions and prevention strategies and signs and symptoms of cellulitis infection with maximum assistance from caregiver  (using printed reference to reduce risk of progression and to limit infection risk. Baseline:Dependent  Goal status: 07/23/22 GOAL MET  2.  With max caregiver assistance Pt will be able to don and doff multilayer, knee length, compression wraps using gradient techniques from base of toes to popliteal fossa ( one limb at a time) to decrease limb volume, to limit infection risk, and to limit lymphedema progression.  Baseline: Dependent Goal status: 07/23/22 GOAL MET   LONG TERM GOALS: Target date: 09/18/2022   Given this patient's Intake score of 39/100% on the functional outcomes FOTO tool, patient will experience an increase in function of 3 points  to improve basic and instrumental ADLs performance, including lymphedema self-care. Baseline: 39/100 Goal status: ONGOING  2.  With Max CG assistance for all home program  components throughout Intensive Phase CDT  Pt will achieve at least 10% limb volume reductions bilaterally below the knees to return limb to more normal size and shape,  to increase body symmetry for increased balance, to limit infection risk, to decrease pain, and to limit lymphedema progression. Baseline: Dependent Goal status: 08/20/22 PARTIALLY MET- 10.18% Reduction for R leg  3.  With Maximum caregiver assistance Pt will be able  to don and doff appropriate daytime compression garments to limit re-accumulation of lymphatic congestion and to limit infection risk and lymphedema progression Baseline: Dependent Goal status: 07/23/22 Ongoing  4.  With Max caregiver assistance Pt will achieve and sustain no less than 85% compliance with all LE self-care home program components throughout CDT, including modified simple self-MLD, daily skin care and inspection, lymphatic pumping the ex and appropriate compression to limit lymphedema progression and to limit further functional decline. Baseline: Dependent Goal status: 08/20/22 GOAL MET   PLAN:GOAL MET OT FREQUENCY: 3x/week  OT DURATION: 12 weeks  PLANNED INTERVENTIONS: Therapeutic exercises, Therapeutic activity, Patient/Family education, Self Care, DME instructions, Manual lymph drainage, Compression bandaging, Manual therapy, and compression  garment/ device fitting, caregiver training,   PLAN FOR NEXT SESSION: OT Rx visit 1:  Continue RLE CDT Apply RLE knee length compression wraps. Cont Pt and CG edu for LE self care home program.. Transition RLE to self-management phase and commence LLE CDT as soon as RL:E garment fitting is complete.  Andrey Spearman, MS, OTR/L, [facility] 08/20/22 3:47 PM

## 2022-08-22 ENCOUNTER — Ambulatory Visit: Payer: Medicare Other | Admitting: Occupational Therapy

## 2022-08-26 ENCOUNTER — Ambulatory Visit: Payer: Medicare Other | Admitting: Occupational Therapy

## 2022-08-27 ENCOUNTER — Ambulatory Visit: Payer: Medicare Other | Admitting: Occupational Therapy

## 2022-08-29 ENCOUNTER — Ambulatory Visit: Payer: Medicare Other | Admitting: Occupational Therapy

## 2022-09-02 ENCOUNTER — Emergency Department: Payer: Medicare Other

## 2022-09-02 ENCOUNTER — Inpatient Hospital Stay
Admission: EM | Admit: 2022-09-02 | Discharge: 2022-09-13 | DRG: 193 | Disposition: A | Discharge status: Home Health | Payer: Medicare Other | Attending: Internal Medicine | Admitting: Internal Medicine

## 2022-09-02 ENCOUNTER — Other Ambulatory Visit: Payer: Self-pay

## 2022-09-02 DIAGNOSIS — Z79899 Other long term (current) drug therapy: Secondary | ICD-10-CM

## 2022-09-02 DIAGNOSIS — E785 Hyperlipidemia, unspecified: Secondary | ICD-10-CM | POA: Diagnosis present

## 2022-09-02 DIAGNOSIS — Z6841 Body Mass Index (BMI) 40.0 and over, adult: Secondary | ICD-10-CM

## 2022-09-02 DIAGNOSIS — Z79891 Long term (current) use of opiate analgesic: Secondary | ICD-10-CM

## 2022-09-02 DIAGNOSIS — J1100 Influenza due to unidentified influenza virus with unspecified type of pneumonia: Secondary | ICD-10-CM

## 2022-09-02 DIAGNOSIS — G8929 Other chronic pain: Secondary | ICD-10-CM | POA: Diagnosis present

## 2022-09-02 DIAGNOSIS — J9601 Acute respiratory failure with hypoxia: Principal | ICD-10-CM

## 2022-09-02 DIAGNOSIS — E1142 Type 2 diabetes mellitus with diabetic polyneuropathy: Secondary | ICD-10-CM | POA: Diagnosis present

## 2022-09-02 DIAGNOSIS — J1008 Influenza due to other identified influenza virus with other specified pneumonia: Principal | ICD-10-CM | POA: Diagnosis present

## 2022-09-02 DIAGNOSIS — Z8616 Personal history of COVID-19: Secondary | ICD-10-CM

## 2022-09-02 DIAGNOSIS — E876 Hypokalemia: Secondary | ICD-10-CM | POA: Diagnosis present

## 2022-09-02 DIAGNOSIS — D696 Thrombocytopenia, unspecified: Secondary | ICD-10-CM | POA: Diagnosis present

## 2022-09-02 DIAGNOSIS — J9621 Acute and chronic respiratory failure with hypoxia: Secondary | ICD-10-CM | POA: Diagnosis present

## 2022-09-02 DIAGNOSIS — J101 Influenza due to other identified influenza virus with other respiratory manifestations: Secondary | ICD-10-CM

## 2022-09-02 DIAGNOSIS — R0602 Shortness of breath: Secondary | ICD-10-CM | POA: Diagnosis not present

## 2022-09-02 DIAGNOSIS — Z794 Long term (current) use of insulin: Secondary | ICD-10-CM

## 2022-09-02 DIAGNOSIS — J159 Unspecified bacterial pneumonia: Secondary | ICD-10-CM | POA: Diagnosis present

## 2022-09-02 DIAGNOSIS — I10 Essential (primary) hypertension: Secondary | ICD-10-CM | POA: Diagnosis present

## 2022-09-02 DIAGNOSIS — Z86718 Personal history of other venous thrombosis and embolism: Secondary | ICD-10-CM

## 2022-09-02 DIAGNOSIS — I119 Hypertensive heart disease without heart failure: Secondary | ICD-10-CM | POA: Diagnosis present

## 2022-09-02 DIAGNOSIS — Z86711 Personal history of pulmonary embolism: Secondary | ICD-10-CM

## 2022-09-02 DIAGNOSIS — Z20822 Contact with and (suspected) exposure to covid-19: Secondary | ICD-10-CM | POA: Diagnosis present

## 2022-09-02 DIAGNOSIS — Z7901 Long term (current) use of anticoagulants: Secondary | ICD-10-CM

## 2022-09-02 LAB — RESP PANEL BY RT-PCR (RSV, FLU A&B, COVID)  RVPGX2
Influenza A by PCR: POSITIVE — AB
Influenza B by PCR: NEGATIVE
Resp Syncytial Virus by PCR: NEGATIVE
SARS Coronavirus 2 by RT PCR: NEGATIVE

## 2022-09-02 NOTE — ED Provider Triage Note (Signed)
Emergency Medicine Provider Triage Evaluation Note  Elizabeth Rowland , a 81 y.o. female  was evaluated in triage.  Pt complains of shortness of breath. Per EMS, 77% on home O2. They placed her on nonrebreather and reached 96%. Decreased to 6l [place] in triage and is 90-92%.  Physical Exam  BP (!) 151/79 (BP Location: Left Arm)   Pulse 99   Temp 97.9 F (36.6 C) (Oral)   Resp 20   LMP  (LMP Unknown)   SpO2 95%  Gen:   Awake, no distress   Resp:  Normal effort  MSK:   Moves extremities without difficulty  Other:    Medical Decision Making  Medically screening exam initiated at 12:55 PM.  Appropriate orders placed.  Elizabeth Rowland was informed that the remainder of the evaluation will be completed by another provider, this initial triage assessment does not replace that evaluation, and the importance of remaining in the ED until their evaluation is complete.    Elizabeth Pester, FNP 09/02/22 1257

## 2022-09-02 NOTE — ED Triage Notes (Signed)
Arrived via EMS for SOB with unable to manage o2 sat. Per EMS pt has had a cough also.  BP 168/88 O2 98 15l/min via NR[facility]    79% on RA when EMS made contact with pt BGL 240  HR 103 T 99.9

## 2022-09-02 NOTE — ED Provider Notes (Signed)
[facility] Provider Note    Event Date/Time   First MD Initiated Contact with Patient 09/02/22 2258     (approximate)   History   Shortness of Breath   HPI  Elizabeth Rowland is a 81 y.o. female who is on 4 L of oxygen at baseline who comes in with concern for shortness of breath and cough.  Patient was initially on 15 L nonrebreather.  Here on 6 L she is 90 to 92%.  According to patient's son she has been sick since Friday.  They have noticed a cough and bodyaches and increasing shortness of breath.  Patient has been compliant with her Eliquis.  They deny any falls or hitting her head.  Patient denies any chest pain or abdominal pain.  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  Physical Exam   Triage Vital Signs: ED Triage Vitals  Enc Vitals Group     BP 09/02/22 1247 (!) 151/79     Pulse Rate 09/02/22 1247 99     Resp 09/02/22 1247 20     Temp 09/02/22 1247 97.9 F (36.6 C)     Temp Source 09/02/22 1247 Oral     SpO2 09/02/22 1247 95 %     Weight --      Height --      Head Circumference --      Peak Flow --      Pain Score 09/02/22 1248 0     Pain Loc --      Pain Edu? --      Excl. in [place]? --     Most recent vital signs: Vitals:   09/02/22 1247 09/02/22 1645  BP: (!) 151/79 (!) 169/65  Pulse: 99 93  Resp: 20 18  Temp: 97.9 F (36.6 C) 98.2 F (36.8 C)  SpO2: 95% 99%     General: Awake, no distress.  CV:  Good peripheral perfusion.  Resp:  Normal effort.  Abd:  No distention.  Soft and nontender Other:  Edema noted bilateral legs.   ED Results / Procedures / Treatments   Labs (all labs ordered are listed, but only abnormal results are displayed) Labs Reviewed  RESP PANEL BY RT-PCR (RSV, FLU A&B, COVID)  [id number] - Abnormal; Notable for the following components:      Result Value    Influenza A by PCR POSITIVE (*)    All other components within normal limits  COMPREHENSIVE METABOLIC PANEL  CBC WITH DIFFERENTIAL/PLATELET  BRAIN NATRIURETIC PEPTIDE  TROPONIN I (HIGH SENSITIVITY)  TROPONIN I (HIGH SENSITIVITY)     EKG  My interpretation of EKG:  Normal sinus rate of 99 without any ST elevation or T wave inversions, normal intervals  RADIOLOGY I have reviewed the xray personally and interpreted patient has diffuse opacities concerning for edema or atypical infection   PROCEDURES:  Critical Care performed: Yes, see critical care procedure note(s)  .1-3 Lead EKG Interpretation  Performed by: Vanessa [place], MD Authorized by: Vanessa [place], MD     Interpretation: normal     ECG rate:  90   ECG rate assessment: normal     Rhythm: sinus rhythm     Ectopy: none     Conduction: normal   .Critical Care  Performed by: Vanessa , MD Authorized by: Vanessa , MD  Critical care provider statement:    Critical care time (minutes):  30   Critical care was necessary to treat or prevent imminent or life-threatening deterioration of the following conditions:  Respiratory failure   Critical care was time spent personally by me on the following activities:  Development of treatment plan with patient or surrogate, discussions with consultants, evaluation of patient's response to treatment, examination of patient, ordering and review of laboratory studies, ordering and review of radiographic studies, ordering and performing treatments and interventions, pulse oximetry, re-evaluation of patient's condition and review of old charts    MEDICATIONS ORDERED IN ED: Medications - No data to display   IMPRESSION / MDM / ASSESSMENT AND PLAN / ED COURSE  I reviewed the triage vital signs and the nursing notes.   Patient's presentation is most consistent with acute presentation with potential threat to life or bodily function.   Patient was increased from baseline 4 L  up to 6 L.  Chest x-ray ordered evaluate for any pneumonia, edema, pneumothorax and does look concerning for either infectious process versus CHF.  Labs ordered to evaluate for ACS, heart failure, bacterial infection, COVID, flu.  Patient is flu positive.  Patient will require admission due to increasing oxygen demands.  Will start patient on Tamiflu.  Troponin around baseline.  CMP shows potassium slightly low.  CBC reassuring    The patient is on the cardiac monitor to evaluate for evidence of arrhythmia and/or significant heart rate changes.      FINAL CLINICAL IMPRESSION(S) / ED DIAGNOSES   Final diagnoses:  Acute respiratory failure with hypoxia (HCC)  Influenza A     Rx / DC Orders   ED Discharge Orders     None        Note:  This document was prepared using Dragon voice recognition software and may include unintentional dictation errors.   Concha Se, MD 09/03/22 [phone]

## 2022-09-02 NOTE — ED Triage Notes (Signed)
Pt presents to ED due to SOB. Pt is coming from home. Pt c/o having SOB that started this morning. Pt wears 4L [place] at home at baseline. Pt states she is also having a cough. Pt denies N/V/D. Pt A&Ox4.

## 2022-09-03 ENCOUNTER — Other Ambulatory Visit: Payer: Self-pay

## 2022-09-03 ENCOUNTER — Ambulatory Visit (INDEPENDENT_AMBULATORY_CARE_PROVIDER_SITE_OTHER): Payer: Medicare Other | Admitting: Vascular Surgery

## 2022-09-03 ENCOUNTER — Ambulatory Visit: Payer: Medicare Other | Admitting: Occupational Therapy

## 2022-09-03 ENCOUNTER — Encounter: Payer: Self-pay | Admitting: Family Medicine

## 2022-09-03 DIAGNOSIS — E876 Hypokalemia: Secondary | ICD-10-CM

## 2022-09-03 DIAGNOSIS — J9621 Acute and chronic respiratory failure with hypoxia: Secondary | ICD-10-CM | POA: Diagnosis present

## 2022-09-03 DIAGNOSIS — Z79891 Long term (current) use of opiate analgesic: Secondary | ICD-10-CM | POA: Diagnosis not present

## 2022-09-03 DIAGNOSIS — D696 Thrombocytopenia, unspecified: Secondary | ICD-10-CM | POA: Diagnosis present

## 2022-09-03 DIAGNOSIS — G8929 Other chronic pain: Secondary | ICD-10-CM | POA: Diagnosis present

## 2022-09-03 DIAGNOSIS — E785 Hyperlipidemia, unspecified: Secondary | ICD-10-CM | POA: Diagnosis present

## 2022-09-03 DIAGNOSIS — Z79899 Other long term (current) drug therapy: Secondary | ICD-10-CM | POA: Diagnosis not present

## 2022-09-03 DIAGNOSIS — R0602 Shortness of breath: Secondary | ICD-10-CM | POA: Diagnosis present

## 2022-09-03 DIAGNOSIS — E1142 Type 2 diabetes mellitus with diabetic polyneuropathy: Secondary | ICD-10-CM | POA: Insufficient documentation

## 2022-09-03 DIAGNOSIS — I10 Essential (primary) hypertension: Secondary | ICD-10-CM

## 2022-09-03 DIAGNOSIS — Z86718 Personal history of other venous thrombosis and embolism: Secondary | ICD-10-CM | POA: Diagnosis not present

## 2022-09-03 DIAGNOSIS — Z794 Long term (current) use of insulin: Secondary | ICD-10-CM | POA: Diagnosis not present

## 2022-09-03 DIAGNOSIS — Z86711 Personal history of pulmonary embolism: Secondary | ICD-10-CM | POA: Diagnosis not present

## 2022-09-03 DIAGNOSIS — J159 Unspecified bacterial pneumonia: Secondary | ICD-10-CM | POA: Diagnosis present

## 2022-09-03 DIAGNOSIS — Z6841 Body Mass Index (BMI) 40.0 and over, adult: Secondary | ICD-10-CM | POA: Diagnosis not present

## 2022-09-03 DIAGNOSIS — J1100 Influenza due to unidentified influenza virus with unspecified type of pneumonia: Secondary | ICD-10-CM

## 2022-09-03 DIAGNOSIS — I119 Hypertensive heart disease without heart failure: Secondary | ICD-10-CM | POA: Diagnosis present

## 2022-09-03 DIAGNOSIS — Z7901 Long term (current) use of anticoagulants: Secondary | ICD-10-CM | POA: Diagnosis not present

## 2022-09-03 DIAGNOSIS — J1008 Influenza due to other identified influenza virus with other specified pneumonia: Secondary | ICD-10-CM | POA: Diagnosis present

## 2022-09-03 DIAGNOSIS — Z8616 Personal history of COVID-19: Secondary | ICD-10-CM | POA: Diagnosis not present

## 2022-09-03 DIAGNOSIS — Z20822 Contact with and (suspected) exposure to covid-19: Secondary | ICD-10-CM | POA: Diagnosis present

## 2022-09-03 LAB — CBC WITH DIFFERENTIAL/PLATELET
Abs Immature Granulocytes: 0.34 10*3/uL — ABNORMAL HIGH (ref 0.00–0.07)
Basophils Absolute: 0 10*3/uL (ref 0.0–0.1)
Basophils Relative: 0 %
Eosinophils Absolute: 0.1 10*3/uL (ref 0.0–0.5)
Eosinophils Relative: 1 %
HCT: 41.6 % (ref 36.0–46.0)
Hemoglobin: 13.4 g/dL (ref 12.0–15.0)
Immature Granulocytes: 4 %
Lymphocytes Relative: 15 %
Lymphs Abs: 1.4 10*3/uL (ref 0.7–4.0)
MCH: 28.9 pg (ref 26.0–34.0)
MCHC: 32.2 g/dL (ref 30.0–36.0)
MCV: 89.7 fL (ref 80.0–100.0)
Monocytes Absolute: 3.7 10*3/uL — ABNORMAL HIGH (ref 0.1–1.0)
Monocytes Relative: 39 %
Neutro Abs: 3.7 10*3/uL (ref 1.7–7.7)
Neutrophils Relative %: 41 %
Platelets: 60 10*3/uL — ABNORMAL LOW (ref 150–400)
RBC: 4.64 MIL/uL (ref 3.87–5.11)
RDW: 14.7 % (ref 11.5–15.5)
Smear Review: NORMAL
WBC: 9.3 10*3/uL (ref 4.0–10.5)
nRBC: 0 % (ref 0.0–0.2)

## 2022-09-03 LAB — COMPREHENSIVE METABOLIC PANEL
ALT: 40 U/L (ref 0–44)
AST: 83 U/L — ABNORMAL HIGH (ref 15–41)
Albumin: 3.3 g/dL — ABNORMAL LOW (ref 3.5–5.0)
Alkaline Phosphatase: 40 U/L (ref 38–126)
Anion gap: 10 (ref 5–15)
BUN: 17 mg/dL (ref 8–23)
CO2: 27 mmol/L (ref 22–32)
Calcium: 8.3 mg/dL — ABNORMAL LOW (ref 8.9–10.3)
Chloride: 101 mmol/L (ref 98–111)
Creatinine, Ser: 0.76 mg/dL (ref 0.44–1.00)
GFR, Estimated: >60 mL/min (ref >60)
Glucose, Bld: 137 mg/dL — ABNORMAL HIGH (ref 70–99)
Potassium: 3.2 mmol/L — ABNORMAL LOW (ref 3.5–5.1)
Sodium: 138 mmol/L (ref 135–145)
Total Bilirubin: 1 mg/dL (ref 0.3–1.2)
Total Protein: 7.3 g/dL (ref 6.5–8.1)

## 2022-09-03 LAB — CBC
HCT: 38.6 % (ref 36.0–46.0)
Hemoglobin: 12.4 g/dL (ref 12.0–15.0)
MCH: 28.9 pg (ref 26.0–34.0)
MCHC: 32.1 g/dL (ref 30.0–36.0)
MCV: 90 fL (ref 80.0–100.0)
Platelets: 60 10*3/uL — ABNORMAL LOW (ref 150–400)
RBC: 4.29 MIL/uL (ref 3.87–5.11)
RDW: 14.9 % (ref 11.5–15.5)
WBC: 9.3 10*3/uL (ref 4.0–10.5)
nRBC: 0 % (ref 0.0–0.2)

## 2022-09-03 LAB — BASIC METABOLIC PANEL
Anion gap: 5 (ref 5–15)
BUN: 16 mg/dL (ref 8–23)
CO2: 29 mmol/L (ref 22–32)
Calcium: 8 mg/dL — ABNORMAL LOW (ref 8.9–10.3)
Chloride: 103 mmol/L (ref 98–111)
Creatinine, Ser: 0.68 mg/dL (ref 0.44–1.00)
GFR, Estimated: >60 mL/min (ref >60)
Glucose, Bld: 132 mg/dL — ABNORMAL HIGH (ref 70–99)
Potassium: 2.9 mmol/L — ABNORMAL LOW (ref 3.5–5.1)
Sodium: 137 mmol/L (ref 135–145)

## 2022-09-03 LAB — PROCALCITONIN: Procalcitonin: <0.1 ng/mL

## 2022-09-03 LAB — PATHOLOGIST SMEAR REVIEW

## 2022-09-03 LAB — GLUCOSE, CAPILLARY
Glucose-Capillary: 183 mg/dL — ABNORMAL HIGH (ref 70–99)
Glucose-Capillary: 105 mg/dL — ABNORMAL HIGH (ref 70–99)

## 2022-09-03 LAB — TROPONIN I (HIGH SENSITIVITY)
Troponin I (High Sensitivity): 24 ng/L — ABNORMAL HIGH (ref <18)
Troponin I (High Sensitivity): 26 ng/L — ABNORMAL HIGH (ref <18)

## 2022-09-03 LAB — BRAIN NATRIURETIC PEPTIDE: B Natriuretic Peptide: 52.2 pg/mL (ref 0.0–100.0)

## 2022-09-03 MED ORDER — INSULIN GLARGINE-YFGN 100 UNIT/ML ~~LOC~~ SOLN
10.0000 [IU] | Freq: Every day | SUBCUTANEOUS | Status: DC
  Administered 2022-09-04 – 2022-09-06 (×3): 10 [IU] via SUBCUTANEOUS
  Filled 2022-09-03 (×3): qty 0.1

## 2022-09-03 MED ORDER — ONDANSETRON HCL 4 MG/2ML IJ SOLN: 4.0000 mg | Freq: Four times a day (QID) | INTRAMUSCULAR | Status: DC | PRN

## 2022-09-03 MED ORDER — TRAMADOL HCL 50 MG PO TABS
100.0000 mg | ORAL_TABLET | Freq: Three times a day (TID) | ORAL | Status: DC | PRN
  Administered 2022-09-03 (×2): 100 mg via ORAL
  Administered 2022-09-04 – 2022-09-08 (×7): 150 mg via ORAL
  Administered 2022-09-08: 100 mg via ORAL
  Administered 2022-09-09 – 2022-09-13 (×9): 150 mg via ORAL
  Filled 2022-09-03 (×2): qty 3
  Filled 2022-09-03 (×2): qty 2
  Filled 2022-09-03 (×14): qty 3
  Filled 2022-09-03: qty 2

## 2022-09-03 MED ORDER — POTASSIUM CHLORIDE IN NACL 20-0.9 MEQ/L-% IV SOLN
INTRAVENOUS | Status: DC
  Filled 2022-09-03 (×2): qty 1000

## 2022-09-03 MED ORDER — FUROSEMIDE 40 MG PO TABS
40.0000 mg | ORAL_TABLET | Freq: Every day | ORAL | Status: DC
  Administered 2022-09-03 – 2022-09-05 (×3): 40 mg via ORAL
  Filled 2022-09-03 (×4): qty 1

## 2022-09-03 MED ORDER — ONDANSETRON HCL 4 MG PO TABS: 4.0000 mg | ORAL_TABLET | Freq: Four times a day (QID) | ORAL | Status: DC | PRN

## 2022-09-03 MED ORDER — INSULIN ASPART 100 UNIT/ML IJ SOLN
0.0000 [IU] | Freq: Three times a day (TID) | INTRAMUSCULAR | Status: DC
  Administered 2022-09-03 – 2022-09-04 (×2): 2 [IU] via SUBCUTANEOUS
  Administered 2022-09-04: 3 [IU] via SUBCUTANEOUS
  Administered 2022-09-05: 2 [IU] via SUBCUTANEOUS
  Administered 2022-09-05: 1 [IU] via SUBCUTANEOUS
  Administered 2022-09-05 – 2022-09-06 (×2): 2 [IU] via SUBCUTANEOUS
  Administered 2022-09-06: 7 [IU] via SUBCUTANEOUS
  Administered 2022-09-06 – 2022-09-09 (×9): 2 [IU] via SUBCUTANEOUS
  Administered 2022-09-09: 3 [IU] via SUBCUTANEOUS
  Administered 2022-09-10: 5 [IU] via SUBCUTANEOUS
  Administered 2022-09-10: 1 [IU] via SUBCUTANEOUS
  Administered 2022-09-10: 2 [IU] via SUBCUTANEOUS
  Administered 2022-09-11 (×2): 3 [IU] via SUBCUTANEOUS
  Administered 2022-09-11: 2 [IU] via SUBCUTANEOUS
  Administered 2022-09-12 (×2): 1 [IU] via SUBCUTANEOUS
  Administered 2022-09-12: 3 [IU] via SUBCUTANEOUS
  Administered 2022-09-13: 2 [IU] via SUBCUTANEOUS
  Administered 2022-09-13: 1 [IU] via SUBCUTANEOUS
  Filled 2022-09-03 (×29): qty 1

## 2022-09-03 MED ORDER — PRAVASTATIN SODIUM 20 MG PO TABS
10.0000 mg | ORAL_TABLET | Freq: Every day | ORAL | Status: DC
  Administered 2022-09-03 – 2022-09-13 (×11): 10 mg via ORAL
  Filled 2022-09-03 (×11): qty 1

## 2022-09-03 MED ORDER — OSELTAMIVIR PHOSPHATE 75 MG PO CAPS: 75.0000 mg | ORAL_CAPSULE | Freq: Once | ORAL | Status: DC

## 2022-09-03 MED ORDER — ACETAMINOPHEN 325 MG PO TABS
650.0000 mg | ORAL_TABLET | Freq: Four times a day (QID) | ORAL | Status: DC | PRN
  Administered 2022-09-04 – 2022-09-12 (×7): 650 mg via ORAL
  Filled 2022-09-03 (×7): qty 2

## 2022-09-03 MED ORDER — POTASSIUM CHLORIDE 20 MEQ PO PACK
40.0000 meq | PACK | Freq: Once | ORAL | Status: AC
  Administered 2022-09-03: 40 meq via ORAL
  Filled 2022-09-03: qty 2

## 2022-09-03 MED ORDER — ENOXAPARIN SODIUM 40 MG/0.4ML IJ SOSY: 40.0000 mg | PREFILLED_SYRINGE | INTRAMUSCULAR | Status: DC

## 2022-09-03 MED ORDER — POTASSIUM CHLORIDE CRYS ER 20 MEQ PO TBCR
40.0000 meq | EXTENDED_RELEASE_TABLET | Freq: Once | ORAL | Status: AC
  Administered 2022-09-03: 40 meq via ORAL
  Filled 2022-09-03: qty 2

## 2022-09-03 MED ORDER — POTASSIUM CHLORIDE CRYS ER 20 MEQ PO TBCR: 20.0000 meq | EXTENDED_RELEASE_TABLET | Freq: Every day | ORAL | Status: DC

## 2022-09-03 MED ORDER — SPIRONOLACTONE 12.5 MG HALF TABLET
12.5000 mg | ORAL_TABLET | Freq: Every day | ORAL | Status: DC
  Administered 2022-09-03 – 2022-09-11 (×9): 12.5 mg via ORAL
  Filled 2022-09-03 (×12): qty 1

## 2022-09-03 MED ORDER — ACETAMINOPHEN 650 MG RE SUPP: 650.0000 mg | Freq: Four times a day (QID) | RECTAL | Status: DC | PRN

## 2022-09-03 MED ORDER — MAGNESIUM HYDROXIDE 400 MG/5ML PO SUSP
30.0000 mL | Freq: Every day | ORAL | Status: DC | PRN
  Administered 2022-09-11: 30 mL via ORAL
  Filled 2022-09-03 (×2): qty 30

## 2022-09-03 MED ORDER — MENTHOL 3 MG MT LOZG
1.0000 | LOZENGE | OROMUCOSAL | Status: DC | PRN
  Filled 2022-09-03: qty 9

## 2022-09-03 MED ORDER — GABAPENTIN 300 MG PO CAPS
300.0000 mg | ORAL_CAPSULE | Freq: Three times a day (TID) | ORAL | Status: DC
  Administered 2022-09-03 – 2022-09-13 (×31): 300 mg via ORAL
  Filled 2022-09-03 (×31): qty 1

## 2022-09-03 MED ORDER — APIXABAN 5 MG PO TABS
5.0000 mg | ORAL_TABLET | Freq: Two times a day (BID) | ORAL | Status: DC
  Administered 2022-09-03 – 2022-09-13 (×22): 5 mg via ORAL
  Filled 2022-09-03 (×22): qty 1

## 2022-09-03 MED ORDER — INSULIN GLARGINE-YFGN 100 UNIT/ML ~~LOC~~ SOLN
10.0000 [IU] | Freq: Two times a day (BID) | SUBCUTANEOUS | Status: DC
  Administered 2022-09-03: 10 [IU] via SUBCUTANEOUS
  Filled 2022-09-03 (×3): qty 0.1

## 2022-09-03 MED ORDER — OSELTAMIVIR PHOSPHATE 75 MG PO CAPS
75.0000 mg | ORAL_CAPSULE | Freq: Two times a day (BID) | ORAL | Status: AC
  Administered 2022-09-03 – 2022-09-07 (×9): 75 mg via ORAL
  Filled 2022-09-03 (×10): qty 1

## 2022-09-03 MED ORDER — TRAMADOL HCL 50 MG PO TABS
50.0000 mg | ORAL_TABLET | ORAL | Status: DC | PRN
  Administered 2022-09-03 (×2): 50 mg via ORAL
  Filled 2022-09-03 (×2): qty 1

## 2022-09-03 MED ORDER — TRAZODONE HCL 50 MG PO TABS
25.0000 mg | ORAL_TABLET | Freq: Every evening | ORAL | Status: DC | PRN
  Administered 2022-09-03 – 2022-09-08 (×4): 25 mg via ORAL
  Filled 2022-09-03 (×5): qty 1

## 2022-09-03 NOTE — ED Notes (Addendum)
Messaged Mansy, MD regarding pts SpO2 of 89% on 5L [place]. Pt had previously been maintaining at 98% on her chronic 4L [place]. Denies increase in SOB. Denies CP/lightheadedness/dizziness at this time. Mansy, MD is room to see pt at this time.

## 2022-09-03 NOTE — Assessment & Plan Note (Signed)
-   The patient will be placed on supplement coverage with NovoLog. - Will continue basal coverage. - We will continue Neurontin.

## 2022-09-03 NOTE — Assessment & Plan Note (Signed)
-  We will replace potassium and check magnesium level. 

## 2022-09-03 NOTE — Assessment & Plan Note (Signed)
-   The patient will be placed on p.o. Tamiflu. - We will hydrate with IV normal saline. - Mucolytic therapy and bronchodilator therapy will be provided as needed.

## 2022-09-03 NOTE — Assessment & Plan Note (Signed)
-   We will continue her Eliquis. 

## 2022-09-03 NOTE — Assessment & Plan Note (Signed)
-   We will continue statin therapy. 

## 2022-09-03 NOTE — H&P (Signed)
[place]   PATIENT NAME: Elizabeth Rowland    MR#:  [id number]  DATE OF BIRTH:  04-15-1941  DATE OF ADMISSION:  09/02/2022  PRIMARY CARE PHYSICIAN: Leanna Sato, MD   Patient is coming from: Home  REQUESTING/REFERRING PHYSICIAN: Artis Delay, MD  CHIEF COMPLAINT:   Chief Complaint  Patient presents with   Shortness of Breath    HISTORY OF PRESENT ILLNESS:  Elizabeth Rowland is a 81 y.o. female with medical history significant for PE on Eliquis, lymphedema, type 2 diabetes mellitus, who presented to the ER with acute onset of worsening dyspnea with associated cough productive of gray sputum as well as wheezing and fever and chills over the last couple of days.  She admits to mild abdominal discomfort but denies any nausea or vomiting or diarrhea or melena or bright red bleeding per rectum.  No chest pain or palpitations.  She has been on home O2 at 4 L/min after having COVID-19 infection last year.  She needed more oxygen today and initially was placed on BiPAP in the ER due to significant respiratory distress.  ED Course: When she came to the ER BP was 151/79 with pulse oximetry of 95% on 4 L and later 6 L of O2 a by nasal cannula.  Labs revealed hypokalemia 3.2 with albumin 3.3 and AST 83 with high sensitive troponin I 24 and later 26 and procalcitonin less than 0.1.  CBC showed thrombocytopenia.  Influenza A antigen came back positive and influenza B was negative as well as RSV and COVID-19 PCR's. EKG as reviewed by me : Sinus rhythm with a rate of 99 with minimal voltage criteria for LVH. Imaging: Two-view chest x-ray showed cardiomegaly with bilateral diffuse interstitial pulmonary opacities consistent with edema or atypical/viral infection with no focal airspace opacity.  The patient was given p.o. Tamiflu and 40 mill equivalent p.o. potassium chloride.  She will be admitted to a medical telemetry bed for further evaluation and management. PAST MEDICAL HISTORY:    Past Medical History:  Diagnosis Date   Diabetes mellitus without complication (HCC)   -PE on Eliquis - Lymphedema   PAST SURGICAL HISTORY:   Past Surgical History:  Procedure Laterality Date   NO PAST SURGERIES      SOCIAL HISTORY:   Social History   Tobacco Use   Smoking status: Never   Smokeless tobacco: Never  Substance Use Topics   Alcohol use: Never  No history of tobacco or EtOH abuse or illicit drug use.  FAMILY HISTORY:   Family History  Problem Relation Age of Onset   Stroke Mother     DRUG ALLERGIES:  No Known Allergies  REVIEW OF SYSTEMS:   ROS As per history of present illness. All pertinent systems were reviewed above. Constitutional, HEENT, cardiovascular, respiratory, GI, GU, musculoskeletal, neuro, psychiatric, endocrine, integumentary and hematologic systems were reviewed and are otherwise negative/unremarkable except for positive findings mentioned above in the HPI.   MEDICATIONS AT HOME:   Prior to Admission medications   Medication Sig Start Date End Date Taking? Authorizing Provider  apixaban (ELIQUIS) 5 MG TABS tablet Take 1 tablet (5 mg total) by mouth 2 (two) times daily. 01/18/21  Yes Rai, Ripudeep K, MD  fluconazole (DIFLUCAN) 200 MG tablet Take 200 mg by mouth once a week. 08/06/22  Yes [provider]  furosemide (LASIX) 40 MG tablet Take 1 tablet (40 mg total) by mouth daily. 01/19/21 09/02/22 Yes Rai, Delene Ruffini, MD  gabapentin (  NEURONTIN) 300 MG capsule Take 1 capsule by mouth 3 (three) times daily. 11/24/20  Yes [provider]  insulin glargine (LANTUS) 100 UNIT/ML injection Inject 0.1 mLs (10 Units total) into the skin 2 (two) times daily. 02/01/21  Yes Dahal, Melina Schools, MD  potassium chloride SA (KLOR-CON) 20 MEQ tablet Take 1 tablet (20 mEq total) by mouth daily. 01/22/21  Yes Rai, Ripudeep K, MD  pravastatin (PRAVACHOL) 10 MG tablet Take 10 mg by mouth daily. 03/26/14  Yes [provider]  spironolactone  (ALDACTONE) 25 MG tablet Take 12.5 mg by mouth daily. 08/02/22 10/31/22 Yes [provider]  traMADol (ULTRAM) 50 MG tablet Take 50 mg by mouth every 4 (four) hours as needed. 10/04/21  Yes [provider]  docusate sodium (COLACE) 100 MG capsule Take 100 mg by mouth 2 (two) times daily. Patient not taking: Reported on 10/30/2021    [provider]  insulin aspart (NOVOLOG) 100 UNIT/ML injection Inject 0-20 Units into the skin 3 (three) times daily with meals. Patient not taking: Reported on 10/30/2021 02/01/21   Lorin Glass, MD  insulin aspart (NOVOLOG) 100 UNIT/ML injection Inject 0-5 Units into the skin at bedtime. Patient not taking: Reported on 10/30/2021 02/01/21   Lorin Glass, MD  Multiple Vitamin (MULTIVITAMIN WITH MINERALS) TABS tablet Take 1 tablet by mouth daily. Patient not taking: Reported on 10/30/2021 01/19/21   Rai, Delene Ruffini, MD  nystatin (MYCOSTATIN) 100000 UNIT/ML suspension Take 5 mLs (500,000 Units total) by mouth 4 (four) times daily. X 10 days Patient not taking: Reported on 10/30/2021 01/18/21   Rai, Delene Ruffini, MD  polyethylene glycol (MIRALAX / GLYCOLAX) 17 g packet Take 17 g by mouth daily as needed for mild constipation, moderate constipation or severe constipation. Patient not taking: Reported on 10/30/2021    [provider]      VITAL SIGNS:  Blood pressure (!) 147/68, pulse 96, temperature 98.9 F (37.2 C), temperature source Oral, resp. rate (!) 27, height 5\' 3"  (1.6 m), weight 105 kg, SpO2 95 %.  PHYSICAL EXAMINATION:  Physical Exam  GENERAL:  81 y.o.-year-old Caucasian female patient lying in the bed with mild respiratory distress with conversational dyspnea. EYES: Pupils equal, round, reactive to light and accommodation. No scleral icterus. Extraocular muscles intact.  HEENT: Head atraumatic, normocephalic. Oropharynx and nasopharynx clear.  NECK:  Supple, no jugular venous distention. No thyroid enlargement, no tenderness.   LUNGS: Diminished bibasilar breath sounds with bibasal crackles.. No use of accessory muscles of respiration.  CARDIOVASCULAR: Regular rate and rhythm, S1, S2 normal. No murmurs, rubs, or gallops.  ABDOMEN: Soft, nondistended, nontender. Bowel sounds present. No organomegaly or mass.  EXTREMITIES: No pedal edema, cyanosis, or clubbing.  NEUROLOGIC: Cranial nerves II through XII are intact. Muscle strength 5/5 in all extremities. Sensation intact. Gait not checked.  PSYCHIATRIC: The patient is alert and oriented x 3.  Normal affect and good eye contact. SKIN: No obvious rash, lesion, or ulcer.   LABORATORY PANEL:   CBC Recent Labs  Lab 09/03/22 0425  WBC 9.3  HGB 12.4  HCT 38.6  PLT 60*   ------------------------------------------------------------------------------------------------------------------  Chemistries  Recent Labs  Lab 09/03/22 0122 09/03/22 0425  NA 138 137  K 3.2* 2.9*  CL 101 103  CO2 27 29  GLUCOSE 137* 132*  BUN 17 16  CREATININE 0.76 0.68  CALCIUM 8.3* 8.0*  AST 83*  --   ALT 40  --   ALKPHOS 40  --   BILITOT 1.0  --    ------------------------------------------------------------------------------------------------------------------  Cardiac Enzymes No results for input(s): "TROPONINI" in the last 168 hours. ------------------------------------------------------------------------------------------------------------------  RADIOLOGY:  DG Chest 2 View  Result Date: 09/02/2022 CLINICAL DATA:  Shortness of breath EXAM: CHEST - 2 VIEW COMPARISON:  01/28/2021 FINDINGS: Cardiomegaly. Diffuse bilateral interstitial pulmonary opacity. Osseous structures unremarkable. IMPRESSION: Cardiomegaly with diffuse bilateral interstitial pulmonary opacity, consistent with edema or atypical/viral infection. No focal airspace opacity. Electronically Signed   By: Jearld Lesch M.D.   On: 09/02/2022 13:25      IMPRESSION AND PLAN:  Assessment and Plan: * Acute on  chronic respiratory failure with hypoxia (HCC) - This is secondary to influenza A pneumonia. - The patient will be admitted to a medical telemetry bed. - O2 protocol will be followed. - Management otherwise as below.  Influenza and pneumonia - The patient will be placed on p.o. Tamiflu. - We will hydrate with IV normal saline. - Mucolytic therapy and bronchodilator therapy will be provided as needed.  Hypokalemia - We will replace potassium and check magnesium level.  Dyslipidemia - We will continue statin therapy.  History of pulmonary embolism - We will continue her Eliquis.  Type 2 diabetes mellitus with peripheral neuropathy (HCC) - The patient will be placed on supplement coverage with NovoLog. - Will continue basal coverage. - We will continue Neurontin.  Essential hypertension - We will place him on as needed IV labetalol.     DVT prophylaxis: Eliquis. Advanced Care Planning:  Code Status: full code.  Family Communication:  The plan of care was discussed in details with the patient (and family). I answered all questions. The patient agreed to proceed with the above mentioned plan. Further management will depend upon hospital course. Disposition Plan: Back to previous home environment Consults called: none.  All the records are reviewed and case discussed with ED provider.  Status is: Inpatient   At the time of the admission, it appears that the appropriate admission status for this patient is inpatient.  This is judged to be reasonable and necessary in order to provide the required intensity of service to ensure the patient's safety given the presenting symptoms, physical exam findings and initial radiographic and laboratory data in the context of comorbid conditions.  The patient requires inpatient status due to high intensity of service, high risk of further deterioration and high frequency of surveillance required.  I certify that at the time of admission, it is  my clinical judgment that the patient will require inpatient hospital care extending more than 2 midnights.                            Dispo: The patient is from: Home              Anticipated d/c is to: Home              Patient currently is not medically stable to d/c.              Difficult to place patient: No  Hannah Beat M.D on 09/03/2022 at 6:51 AM  [facility]   From 7 PM-7 AM, contact night-coverage [email or web address]  CC: Primary care physician; Leanna Sato, MD

## 2022-09-03 NOTE — Assessment & Plan Note (Signed)
-   This is secondary to influenza A pneumonia. - The patient will be admitted to a medical telemetry bed. - O2 protocol will be followed. - Management otherwise as below.

## 2022-09-03 NOTE — Assessment & Plan Note (Signed)
-   We will place him on as needed IV labetalol.

## 2022-09-03 NOTE — Progress Notes (Signed)
  PROGRESS NOTE    Elizabeth Rowland  JTT:[id number] DOB: 07/29/1941 DOA: 09/02/2022 PCP: Leanna Sato, MD  [id number]/[id number]-AA  LOS: 0 days   Brief hospital course:   Assessment & Plan: Elizabeth Rowland is a 81 y.o. female with medical history significant for PE on Eliquis, lymphedema, type 2 diabetes mellitus, who presented to the ER with acute onset of worsening dyspnea with associated cough productive of gray sputum as well as wheezing and fever and chills over the last couple of days.  She admits to mild abdominal discomfort but denies any nausea or vomiting or diarrhea or melena or bright red bleeding per rectum.  No chest pain or palpitations.  She has been on home O2 at 4 L/min after having COVID-19 infection last year.  She needed more oxygen today and initially was placed on BiPAP in the ER due to significant respiratory distress.    * Acute on chronic respiratory failure with hypoxia (HCC) - This is secondary to influenza A pneumonia.  On home 4L O2 PTA. --currently on 8L O2 Plan: --Continue supplemental O2 to keep sats >=90%, wean as tolerated  Influenza and pneumonia --cont Tamiflu  Acute on chronic thrombocytopenia  --plt 60.  Had thrombocytopenia back in May 2022, from 71-115.  Possibly due to acute illness. --monitor for now  Hypokalemia --monitor and replete PRN  Dyslipidemia - continue statin therapy.  History of pulmonary embolism - continue her Eliquis.  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   Objective: Vitals:   09/03/22 1100 09/03/22 1200 09/03/22 1409 09/03/22 1518  BP: (!) 114/95 120/74 (!) 133/56 123/70  Pulse: 100 (!) 105 (!) 104 (!) 103  Resp: (!) 26 (!) 24 (!) 24 16  Temp:   99.3 F (37.4 C) 98.6 F (37 C)  TempSrc:   Oral Oral  SpO2: 93% 96% 95% 94%  Weight:      Height:        Intake/Output Summary (Last 24 hours) at 09/03/2022 1712 Last data filed at 09/03/2022 1155 Gross per 24 hour  Intake --  Output 1200 ml  Net -1200 ml   Filed Weights   09/03/22 0303  Weight: 105 kg    Examination:   Constitutional: NAD, AAOx3 HEENT: conjunctivae and lids normal, EOMI CV: No cyanosis.   RESP: normal respiratory effort, on 8L Extremities: chronic lymphedema in BLE with thickened skin changes SKIN: warm, dry Neuro: II - XII grossly intact.     Data Reviewed: I have personally reviewed labs and imaging studies  Time spent: 50 minutes  Darlin Priestly, MD [facility] If 7PM-7AM, please contact night-coverage 09/03/2022, 5:12 PM

## 2022-09-03 NOTE — ED Notes (Signed)
Patient c/o being cold. Room temperature increased and warm blankets given. Patient asking for medication for sore throat. Message sent to MD at this time.

## 2022-09-03 NOTE — ED Notes (Signed)
Pt A&Ox4. Family at bedside. Pt morning meds given and tolerated well. Pt vitals stable. Pt has breakfast at bedside and warmed it up.

## 2022-09-04 DIAGNOSIS — J9621 Acute and chronic respiratory failure with hypoxia: Secondary | ICD-10-CM | POA: Diagnosis not present

## 2022-09-04 LAB — BASIC METABOLIC PANEL
Anion gap: 5 (ref 5–15)
BUN: 18 mg/dL (ref 8–23)
CO2: 27 mmol/L (ref 22–32)
Calcium: 8.2 mg/dL — ABNORMAL LOW (ref 8.9–10.3)
Chloride: 104 mmol/L (ref 98–111)
Creatinine, Ser: 0.83 mg/dL (ref 0.44–1.00)
GFR, Estimated: >60 mL/min (ref >60)
Glucose, Bld: 114 mg/dL — ABNORMAL HIGH (ref 70–99)
Potassium: 3.4 mmol/L — ABNORMAL LOW (ref 3.5–5.1)
Sodium: 136 mmol/L (ref 135–145)

## 2022-09-04 LAB — GLUCOSE, CAPILLARY
Glucose-Capillary: 121 mg/dL — ABNORMAL HIGH (ref 70–99)
Glucose-Capillary: 204 mg/dL — ABNORMAL HIGH (ref 70–99)
Glucose-Capillary: 156 mg/dL — ABNORMAL HIGH (ref 70–99)
Glucose-Capillary: 157 mg/dL — ABNORMAL HIGH (ref 70–99)

## 2022-09-04 LAB — MAGNESIUM: Magnesium: 2 mg/dL (ref 1.7–2.4)

## 2022-09-04 LAB — CBC
HCT: 40.4 % (ref 36.0–46.0)
Hemoglobin: 13 g/dL (ref 12.0–15.0)
MCH: 29.1 pg (ref 26.0–34.0)
MCHC: 32.2 g/dL (ref 30.0–36.0)
MCV: 90.4 fL (ref 80.0–100.0)
Platelets: 81 10*3/uL — ABNORMAL LOW (ref 150–400)
RBC: 4.47 MIL/uL (ref 3.87–5.11)
RDW: 15.1 % (ref 11.5–15.5)
WBC: 12.9 10*3/uL — ABNORMAL HIGH (ref 4.0–10.5)
nRBC: 0.2 % (ref 0.0–0.2)

## 2022-09-04 LAB — HEMOGLOBIN A1C
Hgb A1c MFr Bld: 8.1 % — ABNORMAL HIGH (ref 4.8–5.6)
Mean Plasma Glucose: 186 mg/dL

## 2022-09-04 MED ORDER — POTASSIUM CHLORIDE CRYS ER 20 MEQ PO TBCR
40.0000 meq | EXTENDED_RELEASE_TABLET | Freq: Once | ORAL | Status: AC
  Administered 2022-09-04: 40 meq via ORAL
  Filled 2022-09-04: qty 2

## 2022-09-04 MED ORDER — GUAIFENESIN-DM 100-10 MG/5ML PO SYRP
5.0000 mL | ORAL_SOLUTION | ORAL | Status: DC | PRN
  Administered 2022-09-04 – 2022-09-11 (×4): 5 mL via ORAL
  Filled 2022-09-04 (×5): qty 10

## 2022-09-04 MED ORDER — FLUTICASONE PROPIONATE 50 MCG/ACT NA SUSP
1.0000 | Freq: Every day | NASAL | Status: DC
  Administered 2022-09-04 – 2022-09-13 (×10): 1 via NASAL
  Filled 2022-09-04: qty 16

## 2022-09-04 NOTE — Progress Notes (Signed)
  PROGRESS NOTE    LANGSTON Rowland  CNO:[id number] DOB: Jan 18, 1941 DOA: 09/02/2022 PCP: Leanna Sato, MD  202A/[id number]-AA  LOS: 1 day   Brief hospital course:   Assessment & Plan: Elizabeth Rowland is a 81 y.o. female with medical history significant for PE on Eliquis, lymphedema, type 2 diabetes mellitus, who presented to the ER with acute onset of worsening dyspnea with associated cough productive of gray sputum as well as wheezing and fever and chills over the last couple of days.  She admits to mild abdominal discomfort but denies any nausea or vomiting or diarrhea or melena or bright red bleeding per rectum.  No chest pain or palpitations.  She has been on home O2 at 4 L/min after having COVID-19 infection last year.  She needed more oxygen today and initially was placed on BiPAP in the ER due to significant respiratory distress.    * Acute on chronic respiratory failure with hypoxia (HCC) - This is secondary to influenza A pneumonia.  On home 4L O2 PTA. --currently on 8L O2 Plan: --Continue supplemental O2 to keep sats >=90%, wean as tolerated  Influenza and pneumonia --cont Tamiflu  Acute on chronic thrombocytopenia  --plt 60.  Had thrombocytopenia back in May 2022, from 71-115.  Possibly due to acute illness. --monitor for now  Hypokalemia --monitor and replete PRN  Dyslipidemia - continue statin therapy.  History of pulmonary embolism - continue her Eliquis.  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   Objective: Vitals:   09/03/22 1941 09/04/22 0449 09/04/22 0814 09/04/22 1555  BP: (!) 117/91 129/67 136/66 (!) 131/59  Pulse: 99 (!) 101 (!) 102 100  Resp: (!) 22 18 16 16   Temp: 98.3 F (36.8 C) 99.3 F (37.4 C) 99.4 F (37.4 C) 99.2 F (37.3 C)  TempSrc: Oral Oral Oral Oral  SpO2: 93% 90% 92% 94%  Weight:      Height:        Intake/Output Summary (Last 24 hours) at 09/04/2022 1836 Last data filed at 09/04/2022 1700 Gross per 24 hour  Intake --  Output 1200 ml  Net -1200 ml   Filed Weights   09/03/22 0303  Weight: 105 kg    Examination:   Constitutional: NAD, alert, oriented to person and place HEENT: conjunctivae and lids normal, EOMI CV: No cyanosis.   RESP: normal respiratory effort Neuro: II - XII grossly intact.     Data Reviewed: I have personally reviewed labs and imaging studies  Time spent: 35 minutes  09/05/22, MD [facility] If 7PM-7AM, please contact night-coverage 09/04/2022, 6:36 PM

## 2022-09-04 NOTE — TOC Initial Note (Addendum)
Transition of Care [facility]) - Initial/Assessment Note    Patient Details  Name: Elizabeth Rowland MRN: [id number] Date of Birth: [birth year]  Transition of [facility]) CM/SW Contact:    Chapman Fitch, RN Phone Number: 09/04/2022, 9:36 AM  Clinical Narrative:                    Patient has Home O2 through [employer] and normally wears 4 L at home.  Patient currently requiring higher liter flow.  Message sent to [facility] with [employer] to determine if patient's home concentrator is a 5L or a 10 L   Update. Per [facility] concentrator is a Product manager. If patient needs a higher liter flow will require new sats and orders at discharge in order for her home concentrator to be switched out   Patient Goals and CMS Choice        Expected Discharge Plan and Services                                                Prior Living Arrangements/Services                       Activities of Daily Living Home Assistive Devices/Equipment: Dan Humphreys (specify type) ADL Screening (condition at time of admission) Patient's cognitive ability adequate to safely complete daily activities?: Yes Is the patient deaf or have difficulty hearing?: No Does the patient have difficulty seeing, even when wearing glasses/contacts?: No Does the patient have difficulty concentrating, remembering, or making decisions?: No Patient able to express need for assistance with ADLs?: Yes Does the patient have difficulty dressing or bathing?: Yes Independently performs ADLs?: No Communication: Independent Dressing (OT): Needs assistance Is this a change from baseline?: Change from baseline, expected to last <3days Grooming: Needs assistance Is this a change from baseline?: Change from baseline, expected to last <3 days Feeding: Independent Bathing: Needs assistance Is this a change from baseline?: Change from baseline, expected to last <3 days Toileting: Needs assistance Is this a change from  baseline?: Change from baseline, expected to last <3 days In/Out Bed: Needs assistance Is this a change from baseline?: Change from baseline, expected to last <3 days Walks in Home: Appropriate for developmental age Does the patient have difficulty walking or climbing stairs?: Yes Weakness of Legs: Both Weakness of Arms/Hands: None  Permission Sought/Granted                  Emotional Assessment              Admission diagnosis:  Influenza A [J10.1] Acute respiratory failure with hypoxia (HCC) [J96.01] Acute on chronic respiratory failure with hypoxia (HCC) [J96.21] Patient Active Problem List   Diagnosis Date Noted   Acute on chronic respiratory failure with hypoxia (HCC) 09/03/2022   Influenza and pneumonia 09/03/2022   Type 2 diabetes mellitus with peripheral neuropathy (HCC) 09/03/2022   History of pulmonary embolism 09/03/2022   Dyslipidemia 09/03/2022   Lymphedema 05/28/2022   Swelling of limb 10/30/2021   Type 2 diabetes mellitus with hyperlipidemia (HCC) 02/01/2021   Pneumonia due to COVID-19 virus 01/28/2021   Hypomagnesemia 01/28/2021   Hypokalemia 01/28/2021   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HCC) 01/28/2021   Community acquired pneumonia    Lung mass    Acute respiratory failure with hypoxia (HCC) 01/07/2021   Acute respiratory  distress syndrome (ARDS) due to COVID-19 virus ([place]) 01/07/2021   Pulmonary emboli ([place]) 01/07/2021   Severe sepsis with acute organ dysfunction ([place]) 01/06/2021   Hyperlipidemia 01/06/2021   Fibromyalgia 01/06/2021   Essential hypertension 01/06/2021   PCP:  Marguerita Merles, MD Pharmacy:   [facility] [id number] [place] [address] Phone: [phone] Fax: [phone]  [place], [place] - [address] [place] [address] Phone: [phone] Fax: [phone]     Social Determinants of Health  (SDOH) Interventions    Readmission Risk Interventions     No data to display

## 2022-09-04 NOTE — Evaluation (Signed)
Physical Therapy Evaluation Patient Details Name: Elizabeth Rowland MRN: [id number] DOB: 11-19-1940 Today's Date: 09/04/2022  History of Present Illness  Elizabeth Rowland is an 81yoF who comes to [facility] on 12/18 c acute onset SOB. Pt requiring HFNC for adequate saturations. PMH: PE on Eliquis, lymphedema, type 2 diabetes mellitus.  Clinical Impression  Pt admitted with above Dx. Pt has functional limitations due to deficits below (see "PT Problem List"). Pt able to provide details on baseline functional status. Received on 8L HFNC sats at 89% in bed, then 80% at EOB- pt reports inability to perform any inhalation nasally, which pt reports is typically with other viral respiratory episodes. Author moves HFNC to mouth and rate to 10L, saturations improve to 95%. Pt requires heavy assist to EOB, then modA to stand twice. She is able to stay up briefly for straightening bed out. Pt appears very exerted with these minimal efforts. Today pt requires considerable physical assistance to perform bed mobility and transfers, unable to do any short distance walking. Son reports he cannot provide heavy physical assistance at home.  Patient's performance this date reveals decreased ability, independence, and tolerance in performing all basic mobility required for performance of activities of daily living. Pt requires additional DME, close physical assistance, and cues for safe participate in mobility. Pt will benefit from skilled PT intervention to increase independence and safety with basic mobility in preparation for discharge to the venue listed below.        Recommendations for follow up therapy are one component of a multi-disciplinary discharge planning process, led by the attending physician.  Recommendations may be updated based on patient status, additional functional criteria and insurance authorization.  Follow Up Recommendations Skilled nursing-short term rehab (<3 hours/day) Can patient physically  be transported by private vehicle: No    Assistance Recommended at Discharge Set up Supervision/Assistance  Patient can return home with the following  Two people to help with bathing/dressing/bathroom;Two people to help with walking and/or transfers;Help with stairs or ramp for entrance;Assist for transportation;Direct supervision/assist for financial management    Equipment Recommendations None recommended by PT  Recommendations for Other Services       Functional Status Assessment Patient has had a recent decline in their functional status and demonstrates the ability to make significant improvements in function in a reasonable and predictable amount of time.     Precautions / Restrictions Precautions Precautions: Fall Restrictions Weight Bearing Restrictions: No      Mobility  Bed Mobility Overal bed mobility: Needs Assistance Bed Mobility: Supine to Sit, Sit to Supine     Supine to sit: Total assist Sit to supine: Total assist   General bed mobility comments: very weak, labored    Transfers Overall transfer level: Needs assistance   Transfers: Sit to/from Stand Sit to Stand: Mod assist           General transfer comment: unsteady while up, very fearful of falling    Ambulation/Gait Ambulation/Gait assistance:  (unable due to exertional hypoxia and severe weakness)                Stairs            Wheelchair Mobility    Modified Rankin (Stroke Patients Only)       Balance  Pertinent Vitals/Pain Pain Assessment Pain Assessment:  (lots of rib and trunk pain 2/2 on going coughing)    Home Living Family/patient expects to be discharged to:: Private residence Living Arrangements: Children Available Help at Discharge: Family Type of Home: House Home Access: Level entry       Home Layout: One level Home Equipment: Cane - single Librarian, academic (2 wheels)       Prior Function               Mobility Comments: short distance AMB in household, RW needed out of house to MD, on O2 at home ADLs Comments: needs help with dressing, bathing at times     Hand Dominance        Extremity/Trunk Assessment   Upper Extremity Assessment Upper Extremity Assessment: Generalized weakness    Lower Extremity Assessment Lower Extremity Assessment: Generalized weakness    Cervical / Trunk Assessment Cervical / Trunk Assessment:  (large habitus)  Communication      Cognition Arousal/Alertness: Awake/alert Behavior During Therapy: WFL for tasks assessed/performed Overall Cognitive Status: Within Functional Limits for tasks assessed                                          General Comments      Exercises Other Exercises Other Exercises: STS from EOB with hug; STS from EOB pelvis lift, supports self in standing with chairback   Assessment/Plan    PT Assessment Patient needs continued PT services  PT Problem List Decreased strength;Decreased range of motion;Decreased activity tolerance;Decreased balance;Decreased mobility;Cardiopulmonary status limiting activity       PT Treatment Interventions DME instruction;Balance training;Gait training;Stair training;Functional mobility training;Therapeutic activities;Therapeutic exercise;Wheelchair mobility training;Patient/family education    PT Goals (Current goals can be found in the Care Plan section)  Acute Rehab PT Goals Patient Stated Goal: regain strength fro DC to home PT Goal Formulation: With patient Time For Goal Achievement: 09/18/22 Potential to Achieve Goals: Fair    Frequency Min 2X/week     Co-evaluation               AM-PAC PT "6 Clicks" Mobility  Outcome Measure Help needed turning from your back to your side while in a flat bed without using bedrails?: Total Help needed moving from lying on your back to sitting on the side of a flat bed without using  bedrails?: Total Help needed moving to and from a bed to a chair (including a wheelchair)?: Total Help needed standing up from a chair using your arms (e.g., wheelchair or bedside chair)?: Total Help needed to walk in hospital room?: Total Help needed climbing 3-5 steps with a railing? : Total 6 Click Score: 6    End of Session Equipment Utilized During Treatment: Oxygen Activity Tolerance: Patient tolerated treatment well;No increased pain Patient left: in bed;with family/visitor present;with call bell/phone within reach   PT Visit Diagnosis: Difficulty in walking, not elsewhere classified (R26.2);Other abnormalities of gait and mobility (R26.89)    Time: 3354-5625 PT Time Calculation (min) (ACUTE ONLY): 19 min   Charges:   PT Evaluation $PT Eval Moderate Complexity: 1 Mod         4:25 PM, 09/04/22 Rosamaria Lints, PT, DPT Physical Therapist - [facility]  [phone] (ASCOM)    Lea Walbert C 09/04/2022, 4:18 PM

## 2022-09-05 ENCOUNTER — Ambulatory Visit: Payer: Medicare Other | Admitting: Occupational Therapy

## 2022-09-05 DIAGNOSIS — J9621 Acute and chronic respiratory failure with hypoxia: Secondary | ICD-10-CM | POA: Diagnosis not present

## 2022-09-05 LAB — BASIC METABOLIC PANEL
Anion gap: 10 (ref 5–15)
BUN: 18 mg/dL (ref 8–23)
CO2: 26 mmol/L (ref 22–32)
Calcium: 8.2 mg/dL — ABNORMAL LOW (ref 8.9–10.3)
Chloride: 100 mmol/L (ref 98–111)
Creatinine, Ser: 0.86 mg/dL (ref 0.44–1.00)
GFR, Estimated: >60 mL/min (ref >60)
Glucose, Bld: 135 mg/dL — ABNORMAL HIGH (ref 70–99)
Potassium: 3.6 mmol/L (ref 3.5–5.1)
Sodium: 136 mmol/L (ref 135–145)

## 2022-09-05 LAB — GLUCOSE, CAPILLARY
Glucose-Capillary: 149 mg/dL — ABNORMAL HIGH (ref 70–99)
Glucose-Capillary: 155 mg/dL — ABNORMAL HIGH (ref 70–99)
Glucose-Capillary: 160 mg/dL — ABNORMAL HIGH (ref 70–99)
Glucose-Capillary: 157 mg/dL — ABNORMAL HIGH (ref 70–99)

## 2022-09-05 LAB — CBC
HCT: 39.4 % (ref 36.0–46.0)
Hemoglobin: 12.5 g/dL (ref 12.0–15.0)
MCH: 28.7 pg (ref 26.0–34.0)
MCHC: 31.7 g/dL (ref 30.0–36.0)
MCV: 90.4 fL (ref 80.0–100.0)
Platelets: 79 10*3/uL — ABNORMAL LOW (ref 150–400)
RBC: 4.36 MIL/uL (ref 3.87–5.11)
RDW: 15.5 % (ref 11.5–15.5)
WBC: 13.2 10*3/uL — ABNORMAL HIGH (ref 4.0–10.5)
nRBC: 0 % (ref 0.0–0.2)

## 2022-09-05 LAB — MAGNESIUM: Magnesium: 1.9 mg/dL (ref 1.7–2.4)

## 2022-09-05 NOTE — TOC Progression Note (Signed)
Transition of Care [facility]) - Progression Note    Patient Details  Name: Elizabeth Rowland MRN: [id number] Date of Birth: [birth year]  [facility] CM/SW Contact  Chapman Fitch, RN Phone Number: 09/05/2022, 11:39 AM  Clinical Narrative:     Admitted for: flu Admitted from: from home with son  PCP: [facility] Current home health/prior home health/DME: cane and RW  Therapy currently recommending SNF.  Patient states she is not interested and declines bed search.  Patient is agreeable to home health. States she doesn't have a preference of home health agency.  Cory with [facility] has accepted referral with tentative start date next week   MD has spoken with son and he was interested in bed search. VM left for son to discuss disposition.   Patient tested flu positive 12/18.  Notified MD that some SNFs are requiring 7 day quarantine.  Son and patient will need to discuss her wishes, and if she changes her mind and is in agreement to bed search one will be initiated.   It patient returns home her home O2 concentrator only goes up to 5L  Planned Disposition: Home with Health Care Svc    Expected Discharge Plan and Services       Living arrangements for the past 2 months: Single Family Home                                       Social Determinants of Health (SDOH) Interventions SDOH Screenings   Food Insecurity: No Food Insecurity (09/03/2022)  Housing: Low Risk  (09/03/2022)  Transportation Needs: No Transportation Needs (09/03/2022)  Utilities: Not At Risk (09/03/2022)  Tobacco Use: Low Risk  (09/03/2022)    Readmission Risk Interventions     No data to display

## 2022-09-05 NOTE — Evaluation (Signed)
Occupational Therapy Evaluation Patient Details Name: Elizabeth Rowland MRN: [id number] DOB: [birth year] Today's Date: 09/05/2022   History of Present Illness Elizabeth Rowland is an 81yoF who comes to [facility] on 12/18 c acute onset SOB. Pt requiring HFNC for adequate saturations. PMH: PE on Eliquis, lymphedema, type 2 diabetes mellitus.   Clinical Impression   Elizabeth Rowland presents with generalized weakness, limited endurance, pain, and fatigue. She lives with her son and daughter-in-law, has been largely IND in ADL performance, dressing and toileting INDly, mostly taking sponge baths, ambulating without AD and with no falls, using 4L O2. Family assists with IADLs. During today's evaluation, pt is far from her norm. She has a flat affect, appears disengaged and fatigued, reports "lots of pain" in her legs and chest, and is using 8L O2 via HFNC. Pt requires Mod A for rolling in bed, can move her legs laterally in supine only a few inches, requires Total A for bed mobility. Pt engages in limited therex in supine, needing rest breaks after 2 or 3 repeats. Pt's O2 sats in low 90s. Recommend ongoing O2 during hospitalization, with DC to SNF to support return to PLOF.    Recommendations for follow up therapy are one component of a multi-disciplinary discharge planning process, led by the attending physician.  Recommendations may be updated based on patient status, additional functional criteria and insurance authorization.   Follow Up Recommendations  Skilled nursing-short term rehab (<3 hours/day)     Assistance Recommended at Discharge    Patient can return home with the following A lot of help with walking and/or transfers;A lot of help with bathing/dressing/bathroom;Assistance with cooking/housework    Functional Status Assessment  Patient has had a recent decline in their functional status and demonstrates the ability to make significant improvements in function in a reasonable and predictable  amount of time.  Equipment Recommendations  None recommended by OT    Recommendations for Other Services       Precautions / Restrictions Precautions Precautions: Fall Restrictions Weight Bearing Restrictions: No      Mobility Bed Mobility Overal bed mobility: Needs Assistance Bed Mobility: Supine to Sit, Sit to Supine, Rolling Rolling: Mod assist   Supine to sit: Total assist Sit to supine: Total assist   General bed mobility comments: very weak, labored    Transfers                   General transfer comment: pt unable to come up into standing w/ Max A      Balance Overall balance assessment: Needs assistance Sitting-balance support: Feet unsupported, Bilateral upper extremity supported Sitting balance-Leahy Scale: Poor       Standing balance-Leahy Scale: Zero                             ADL either performed or assessed with clinical judgement   ADL                                               Vision         Perception     Praxis      Pertinent Vitals/Pain Pain Assessment Pain Assessment: Faces Faces Pain Scale: Hurts even more Pain Location: ribs, chest Pain Descriptors / Indicators: Aching Pain Intervention(s): Limited activity within patient's tolerance, Repositioned  Hand Dominance     Extremity/Trunk Assessment Upper Extremity Assessment Upper Extremity Assessment: Generalized weakness   Lower Extremity Assessment Lower Extremity Assessment: Generalized weakness       Communication     Cognition Arousal/Alertness: Lethargic Behavior During Therapy: Flat affect, WFL for tasks assessed/performed Overall Cognitive Status: Within Functional Limits for tasks assessed                                 General Comments: Pt flat, speaking slowly and in short sentences, no eye contact     General Comments       Exercises Other Exercises Other Exercises: Bed-level UE and LE  therex   Shoulder Instructions      Home Living Family/patient expects to be discharged to:: Private residence Living Arrangements: Children Available Help at Discharge: Family Type of Home: House Home Access: Level entry     Home Layout: One level     Bathroom Shower/Tub: Chief Strategy Officer: Standard     Home Equipment: Cane - single Librarian, academic (2 wheels)          Prior Functioning/Environment Prior Level of Function : Needs assist             Mobility Comments: short distance AMB in household, RW needed out of house to MD, on O2 at home ADLs Comments: needs help with dressing, bathing at times. Takes sponge baths.        OT Problem List: Cardiopulmonary status limiting activity;Impaired balance (sitting and/or standing);Decreased activity tolerance;Decreased range of motion;Decreased strength;Decreased coordination;Pain      OT Treatment/Interventions: Self-care/ADL training;Therapeutic exercise;Patient/family education;Balance training;Energy conservation;Therapeutic activities    OT Goals(Current goals can be found in the care plan section) Acute Rehab OT Goals Patient Stated Goal: Pt unable to state goal OT Goal Formulation: With patient Time For Goal Achievement: 09/19/22 Potential to Achieve Goals: Fair ADL Goals Pt Will Perform Grooming: with supervision;sitting;standing Pt Will Perform Upper Body Dressing: with supervision;sitting Pt Will Transfer to Toilet: with min assist;ambulating;regular height toilet  OT Frequency: Min 2X/week    Co-evaluation              AM-PAC OT "6 Clicks" Daily Activity     Outcome Measure Help from another person eating meals?: A Little Help from another person taking care of personal grooming?: A Lot Help from another person toileting, which includes using toliet, bedpan, or urinal?: A Lot Help from another person bathing (including washing, rinsing, drying)?: A Lot Help from another  person to put on and taking off regular upper body clothing?: A Lot Help from another person to put on and taking off regular lower body clothing?: A Lot 6 Click Score: 13   End of Session Equipment Utilized During Treatment: Oxygen  Activity Tolerance: Patient limited by lethargy;Other (comment);Patient limited by pain (pt limited by SOB) Patient left: in bed;with call bell/phone within reach;with bed alarm set  OT Visit Diagnosis: Muscle weakness (generalized) (M62.81);Unsteadiness on feet (R26.81);Other abnormalities of gait and mobility (R26.89)                Time: 9528-4132 OT Time Calculation (min): 17 min Charges:  OT General Charges $OT Visit: 1 Visit OT Evaluation $OT Eval Moderate Complexity: 1 Mod OT Treatments $Self Care/Home Management : 8-22 mins Latina Craver, PhD, MS, OTR/L 09/05/22, 12:14 PM

## 2022-09-05 NOTE — Progress Notes (Addendum)
  PROGRESS NOTE    Elizabeth Rowland  OVZ:[id number] DOB: [birth year] DOA: 09/02/2022 PCP: Leanna Sato, MD  [id number]/[id number]-AA  LOS: 2 days   Brief hospital course:   Assessment & Plan: Elizabeth Rowland is a 81 y.o. female with medical history significant for PE on Eliquis, lymphedema, type 2 diabetes mellitus, who presented to the ER with acute onset of worsening dyspnea with associated cough productive of gray sputum as well as wheezing and fever and chills over the last couple of days.  She admits to mild abdominal discomfort but denies any nausea or vomiting or diarrhea or melena or bright red bleeding per rectum.  No chest pain or palpitations.  She has been on home O2 at 4 L/min after having COVID-19 infection last year.  She needed more oxygen today and initially was placed on BiPAP in the ER due to significant respiratory distress.    * Acute on chronic respiratory failure with hypoxia (HCC) - This is secondary to influenza A pneumonia.  On home 4L O2 PTA. --currently on 8L O2 Plan: --Continue supplemental O2 to keep sats >=90%, wean as tolerated  Influenza and pneumonia --cont tamiflu  Acute on chronic thrombocytopenia  --plt 60.  Had thrombocytopenia back in May 2022, from 71-115.  Possibly due to acute illness. --monitor for now  Hypokalemia --monitor and replete PRN  Dyslipidemia --cont statin  History of pulmonary embolism --cont Eliquis  Type 2 diabetes mellitus with peripheral neuropathy (HCC) --cont glargine as 10u daily --ACHS and SSI  Essential hypertension - cont lasix and spironolactone  Chronic pain on chronic opioids --cont home tramadol PRN  Morbid obesity, BMI: 41.01    DVT prophylaxis: AJ:[id number] Code Status: Full code  Family Communication: son at bedside today Level of care: Telemetry Medical Dispo:   The patient is from: home Anticipated d/c is to: home Anticipated d/c date is: 2-3 days   Subjective and Interval History:  Pt  reported doing ok.  Son reported pt had cough.  Pt declined SNF rehab.   Objective: Vitals:   09/04/22 1925 09/05/22 0342 09/05/22 0735 09/05/22 1554  BP: (!) 124/52 127/65 (!) 111/51 (!) 119/95  Pulse: 100 99 92 99  Resp: 20 20 20 20   Temp: (!) 97.5 F (36.4 C) 99.2 F (37.3 C) 99.1 F (37.3 C) 98.1 F (36.7 C)  TempSrc: Oral Oral Oral Oral  SpO2: 94% 93% 97% 90%  Weight:      Height:        Intake/Output Summary (Last 24 hours) at 09/05/2022 1834 Last data filed at 09/05/2022 1148 Gross per 24 hour  Intake 0 ml  Output 800 ml  Net -800 ml   Filed Weights   09/03/22 0303  Weight: 105 kg    Examination:   Constitutional: NAD, AAOx3 HEENT: conjunctivae and lids normal, EOMI CV: No cyanosis.   RESP: normal respiratory effort, on 8L Neuro: II - XII grossly intact.     Data Reviewed: I have personally reviewed labs and imaging studies  Time spent: 35 minutes  09/05/22, MD [facility] If 7PM-7AM, please contact night-coverage 09/05/2022, 6:34 PM

## 2022-09-06 DIAGNOSIS — J9621 Acute and chronic respiratory failure with hypoxia: Secondary | ICD-10-CM | POA: Diagnosis not present

## 2022-09-06 LAB — MAGNESIUM: Magnesium: 2 mg/dL (ref 1.7–2.4)

## 2022-09-06 LAB — CBC
HCT: 40 % (ref 36.0–46.0)
Hemoglobin: 12.6 g/dL (ref 12.0–15.0)
MCH: 28.5 pg (ref 26.0–34.0)
MCHC: 31.5 g/dL (ref 30.0–36.0)
MCV: 90.5 fL (ref 80.0–100.0)
Platelets: 99 10*3/uL — ABNORMAL LOW (ref 150–400)
RBC: 4.42 MIL/uL (ref 3.87–5.11)
RDW: 15.3 % (ref 11.5–15.5)
WBC: 10.3 10*3/uL (ref 4.0–10.5)
nRBC: 0.2 % (ref 0.0–0.2)

## 2022-09-06 LAB — BASIC METABOLIC PANEL
Anion gap: 11 (ref 5–15)
BUN: 18 mg/dL (ref 8–23)
CO2: 29 mmol/L (ref 22–32)
Calcium: 8.4 mg/dL — ABNORMAL LOW (ref 8.9–10.3)
Chloride: 99 mmol/L (ref 98–111)
Creatinine, Ser: 0.83 mg/dL (ref 0.44–1.00)
GFR, Estimated: >60 mL/min (ref >60)
Glucose, Bld: 199 mg/dL — ABNORMAL HIGH (ref 70–99)
Potassium: 3.4 mmol/L — ABNORMAL LOW (ref 3.5–5.1)
Sodium: 139 mmol/L (ref 135–145)

## 2022-09-06 LAB — GLUCOSE, CAPILLARY
Glucose-Capillary: 181 mg/dL — ABNORMAL HIGH (ref 70–99)
Glucose-Capillary: 311 mg/dL — ABNORMAL HIGH (ref 70–99)
Glucose-Capillary: 169 mg/dL — ABNORMAL HIGH (ref 70–99)
Glucose-Capillary: 175 mg/dL — ABNORMAL HIGH (ref 70–99)

## 2022-09-06 MED ORDER — FUROSEMIDE 10 MG/ML IJ SOLN
40.0000 mg | Freq: Once | INTRAMUSCULAR | Status: AC
  Administered 2022-09-06: 40 mg via INTRAVENOUS
  Filled 2022-09-06: qty 4

## 2022-09-06 MED ORDER — ORAL CARE MOUTH RINSE: 15.0000 mL | OROMUCOSAL | Status: DC | PRN

## 2022-09-06 MED ORDER — IPRATROPIUM-ALBUTEROL 0.5-2.5 (3) MG/3ML IN SOLN
3.0000 mL | Freq: Four times a day (QID) | RESPIRATORY_TRACT | Status: DC
  Administered 2022-09-06 – 2022-09-08 (×7): 3 mL via RESPIRATORY_TRACT
  Filled 2022-09-06 (×6): qty 3

## 2022-09-06 MED ORDER — INSULIN GLARGINE-YFGN 100 UNIT/ML ~~LOC~~ SOLN
8.0000 [IU] | Freq: Two times a day (BID) | SUBCUTANEOUS | Status: DC
  Administered 2022-09-06 – 2022-09-07 (×2): 8 [IU] via SUBCUTANEOUS
  Filled 2022-09-06 (×3): qty 0.08

## 2022-09-06 MED ORDER — POTASSIUM CHLORIDE CRYS ER 20 MEQ PO TBCR
40.0000 meq | EXTENDED_RELEASE_TABLET | Freq: Once | ORAL | Status: AC
  Administered 2022-09-06: 40 meq via ORAL
  Filled 2022-09-06: qty 2

## 2022-09-06 NOTE — Progress Notes (Signed)
  PROGRESS NOTE    Elizabeth Rowland  EPP:[id number] DOB: [birth year] DOA: 09/02/2022 PCP: Leanna Sato, MD  [id number]  LOS: 3 days   Brief hospital course:   Assessment & Plan: Elizabeth Rowland is a 81 y.o. female with medical history significant for PE on Eliquis, lymphedema, type 2 diabetes mellitus, who presented to the ER with acute onset of worsening dyspnea with associated cough productive of gray sputum as well as wheezing and fever and chills over the last couple of days.  She admits to mild abdominal discomfort but denies any nausea or vomiting or diarrhea or melena or bright red bleeding per rectum.  No chest pain or palpitations.  She has been on home O2 at 4 L/min after having COVID-19 infection last year.  She needed more oxygen today and initially was placed on BiPAP in the ER due to significant respiratory distress.    * Acute on chronic respiratory failure with hypoxia (HCC) - This is secondary to influenza A pneumonia.  On home 4L O2 PTA. --CXR also showed Cardiomegaly with diffuse bilateral interstitial pulmonary opacity, so possibly with pulm edema. --currently on 8L O2 Plan: --Continue supplemental O2 to keep sats >=90%, wean as tolerated --trial IV lasix 40  Influenza and pneumonia --cont tamiflu --schedule DuoNeb  Acute on chronic thrombocytopenia  --plt 60.  Had thrombocytopenia back in May 2022, from 71-115.  Possibly due to acute illness. --monitor for now  Hypokalemia --monitor and replete PRN  Dyslipidemia --cont statin  History of pulmonary embolism --cont Eliquis  Type 2 diabetes mellitus with peripheral neuropathy (HCC) --glargine 8u BID --ACHS and SSI  Essential hypertension - cont lasix and spironolactone  Chronic pain on chronic opioids --cont home tramadol PRN  Morbid obesity, BMI: 41.01    DVT prophylaxis: [id number] Code Status: Full code  Family Communication:  Level of care: Telemetry Medical Dispo:   The patient  is from: home Anticipated d/c is to: home Anticipated d/c date is: 2-3 days   Subjective and Interval History:  Pt continued to need 7L O2 at rest, and up to 9L with exertion.     Objective: Vitals:   09/06/22 1211 09/06/22 1355 09/06/22 1400 09/06/22 1451  BP:   (!) 142/61   Pulse:   99   Resp:   20   Temp:   98.2 F (36.8 C)   TempSrc:      SpO2: 97% (!) 82% 97% 97%  Weight:      Height:        Intake/Output Summary (Last 24 hours) at 09/06/2022 1708 Last data filed at 09/06/2022 1424 Gross per 24 hour  Intake 598 ml  Output 1750 ml  Net -1152 ml   Filed Weights   09/03/22 0303  Weight: 105 kg    Examination:   Constitutional: NAD, AAOx3 HEENT: conjunctivae and lids normal, EOMI CV: No cyanosis.   RESP: normal respiratory effort, on 9L Neuro: II - XII grossly intact.     Data Reviewed: I have personally reviewed labs and imaging studies  Time spent: 35 minutes  Darlin Priestly, MD [facility] If 7PM-7AM, please contact night-coverage 09/06/2022, 5:08 PM

## 2022-09-06 NOTE — Progress Notes (Signed)
Physical Therapy Treatment Patient Details Name: Elizabeth Rowland MRN: [id number] DOB: 07-Mar-1941 Today's Date: 09/06/2022   History of Present Illness Pt is an 81 yo F who comes to [facility] on 12/18 c acute onset SOB.  MD assessment includes acute on chronic respiratory failure with hypoxia, influenza, pneumonia, acute on chronic thrombocytopenia, and hypokalemia. PMH includes: PE on Eliquis, lymphedema, type 2 diabetes mellitus.    PT Comments    Pt was pleasant and put forth fair effort during the session.  Pt required physical assistance with all functional tasks along with frequent therapeutic rest breaks and cues for PLB.  Pt found on 7L with SpO2 in the low 90s at rest.  With effort of bed mobility the pt's SpO2 dropped to a low of 81% and increased back to baseline with PLB.  MD contacted and gave ok to proceed with therapy with O2 increased to 9L.  Pt performed transfers and limited gait on 9L with SpO2 dropping to a low of 84% and increasing back to 90% once in chair at end of session, MD and nurse notified of results and that pt left on 9L at end of session.  Pt will benefit from PT services in a SNF setting upon discharge to safely address deficits listed in patient problem list for decreased caregiver assistance and eventual return to PLOF.     Recommendations for follow up therapy are one component of a multi-disciplinary discharge planning process, led by the attending physician.  Recommendations may be updated based on patient status, additional functional criteria and insurance authorization.  Follow Up Recommendations  Skilled nursing-short term rehab (<3 hours/day) Can patient physically be transported by private vehicle: No   Assistance Recommended at Discharge Set up Supervision/Assistance  Patient can return home with the following Two people to help with bathing/dressing/bathroom;Two people to help with walking and/or transfers;Help with stairs or ramp for entrance;Assist  for transportation;Direct supervision/assist for financial management   Equipment Recommendations  None recommended by PT    Recommendations for Other Services       Precautions / Restrictions Precautions Precautions: Fall Restrictions Weight Bearing Restrictions: No     Mobility  Bed Mobility Overal bed mobility: Needs Assistance Bed Mobility: Supine to Sit     Supine to sit: Mod assist, +2 for physical assistance     General bed mobility comments: +2 assist for BLE and trunk control with cues for sequencing    Transfers Overall transfer level: Needs assistance Equipment used: Rolling walker (2 wheels) Transfers: Sit to/from Stand Sit to Stand: +2 physical assistance, Min assist           General transfer comment: Min verbal cues for hand placement and increased trunk flexion    Ambulation/Gait Ambulation/Gait assistance: Min guard, +2 safety/equipment Gait Distance (Feet): 3 Feet Assistive device: Rolling walker (2 wheels) Gait Pattern/deviations: Step-to pattern, Trunk flexed, Decreased step length - right, Decreased step length - left, Shuffle Gait velocity: decreased     General Gait Details: Pt able to take several very small shuffling steps at the EOB and then from bed to chair with close CGA but pt presented with no buckling or LOB   Stairs             Wheelchair Mobility    Modified Rankin (Stroke Patients Only)       Balance Overall balance assessment: Needs assistance Sitting-balance support: Feet supported, Bilateral upper extremity supported Sitting balance-Leahy Scale: Fair     Standing balance support: Bilateral upper  extremity supported, Reliant on assistive device for balance Standing balance-Leahy Scale: Fair                              Cognition Arousal/Alertness: Awake/alert Behavior During Therapy: Flat affect Overall Cognitive Status: Within Functional Limits for tasks assessed                                           Exercises Total Joint Exercises Ankle Circles/Pumps: AROM, Strengthening, Both, 5 reps, 10 reps Quad Sets: Strengthening, Both, 10 reps, 5 reps Hip ABduction/ADduction: AAROM, Strengthening, Both, 10 reps Long Arc Quad: Strengthening, Both, 5 reps, 10 reps Knee Flexion: Strengthening, Both, 5 reps, 10 reps Other Exercises Other Exercises: Pt education for PLB PRN during the session Other Exercises: Pt education on physiological benefits of activity and OOB to chair    General Comments        Pertinent Vitals/Pain Pain Assessment Pain Assessment: 0-10 Pain Score: 10-Worst pain ever Pain Location: Bilateral knees Pain Descriptors / Indicators: Aching, Grimacing, Guarding Pain Intervention(s): Repositioned, Premedicated before session, Monitored during session    Home Living                          Prior Function            PT Goals (current goals can now be found in the care plan section) Progress towards PT goals: Progressing toward goals    Frequency    Min 2X/week      PT Plan Current plan remains appropriate    Co-evaluation PT/OT/SLP Co-Evaluation/Treatment: Yes Reason for Co-Treatment: Complexity of the patient's impairments (multi-system involvement);For patient/therapist safety PT goals addressed during session: Mobility/safety with mobility;Balance;Proper use of DME;Strengthening/ROM OT goals addressed during session: Strengthening/ROM;Proper use of Adaptive equipment and DME;ADL's and self-care      AM-PAC PT "6 Clicks" Mobility   Outcome Measure  Help needed turning from your back to your side while in a flat bed without using bedrails?: A Lot Help needed moving from lying on your back to sitting on the side of a flat bed without using bedrails?: A Lot Help needed moving to and from a bed to a chair (including a wheelchair)?: A Lot Help needed standing up from a chair using your arms (e.g., wheelchair or  bedside chair)?: A Little Help needed to walk in hospital room?: Total Help needed climbing 3-5 steps with a railing? : Total 6 Click Score: 11    End of Session Equipment Utilized During Treatment: Gait belt;Oxygen Activity Tolerance: Patient tolerated treatment well Patient left: in chair;with call bell/phone within reach;with chair alarm set Nurse Communication: Mobility status;Other (comment) (SpO2 with activity, pt left on 9L at end of session) PT Visit Diagnosis: Difficulty in walking, not elsewhere classified (R26.2);Other abnormalities of gait and mobility (R26.89)     Time: 2426-8341 PT Time Calculation (min) (ACUTE ONLY): 33 min  Charges:  $Therapeutic Exercise: 8-22 mins $Therapeutic Activity: 8-22 mins                    D. Scott Carleta Woodrow PT, DPT 09/06/22, 2:22 PM

## 2022-09-06 NOTE — Progress Notes (Signed)
Occupational Therapy Treatment Patient Details Name: Elizabeth Rowland MRN: [id number] DOB: Apr 27, 1941 Today's Date: 09/06/2022   History of present illness Elizabeth Rowland is an 81yoF who comes to [facility] on 12/18 c acute onset SOB. Pt requiring HFNC for adequate saturations. PMH: PE on Eliquis, lymphedema, type 2 diabetes mellitus.   OT comments  Given level of assistance pt requires for mobility, this session was conducted jointly by OT and PT. Elizabeth Rowland endorses 10/10 b/l LE pain, although reports that the rib/chest pain she was experiencing yesterday has resolved at present. Pt begins session with O2 sats at 92% on 7L O2 with pt in supine. In moving supine<sitting EOB, O2 sats drop to 81%. Provided educ re: slow, deep breaths in through nose, out through mouth. Pt takes a seated rest break and O2 adjusted to 8L. Pt requires Mod A for supine<sit, sit<stand with RW, and transfer to recliner, with slow, labored movements and frequent rest breaks. Pt endorses increased knee pain particularly with transition from standing to sitting. O2 sats upon sitting at 83%, O2 increased to 9L, again provided instructions on breathing techniques, with pt struggling to follow these directions. O2 sats returned to 90% within ~ 5 minutes of sitting. RN and MD notified. Pt left in recliner with NT in room.    Recommendations for follow up therapy are one component of a multi-disciplinary discharge planning process, led by the attending physician.  Recommendations may be updated based on patient status, additional functional criteria and insurance authorization.    Follow Up Recommendations  Skilled nursing-short term rehab (<3 hours/day)     Assistance Recommended at Discharge Frequent or constant Supervision/Assistance  Patient can return home with the following  A lot of help with walking and/or transfers;A lot of help with bathing/dressing/bathroom;Assistance with cooking/housework;Help with stairs or ramp  for entrance   Equipment Recommendations  None recommended by OT    Recommendations for Other Services      Precautions / Restrictions Precautions Precautions: Fall Restrictions Weight Bearing Restrictions: No       Mobility Bed Mobility Overal bed mobility: Needs Assistance Bed Mobility: Supine to Sit     Supine to sit: Mod assist, +2 for physical assistance          Transfers Overall transfer level: Needs assistance Equipment used: Rolling walker (2 wheels) Transfers: Sit to/from Stand, Bed to chair/wheelchair/BSC Sit to Stand: Mod assist, +2 physical assistance     Step pivot transfers: +2 physical assistance, Mod assist     General transfer comment: Pt had more difficulty/pain moving from standing to sitting than with sit>stand     Balance Overall balance assessment: Needs assistance Sitting-balance support: Feet supported, Bilateral upper extremity supported Sitting balance-Leahy Scale: Fair     Standing balance support: Bilateral upper extremity supported, Reliant on assistive device for balance Standing balance-Leahy Scale: Fair                             ADL either performed or assessed with clinical judgement   ADL                                              Extremity/Trunk Assessment Upper Extremity Assessment Upper Extremity Assessment: Generalized weakness   Lower Extremity Assessment Lower Extremity Assessment: Generalized weakness        Vision  Perception     Praxis      Cognition Arousal/Alertness: Lethargic Behavior During Therapy: Flat affect, WFL for tasks assessed/performed Overall Cognitive Status: Within Functional Limits for tasks assessed                                          Exercises Other Exercises Other Exercises: Educ re: breathing techniques, ECS    Shoulder Instructions       General Comments      Pertinent Vitals/ Pain       Pain  Assessment Pain Score: 10-Worst pain ever Pain Location: b/l LE Pain Descriptors / Indicators: Aching, Grimacing Pain Intervention(s): Limited activity within patient's tolerance, Repositioned  Home Living                                          Prior Functioning/Environment              Frequency  Min 2X/week        Progress Toward Goals  OT Goals(current goals can now be found in the care plan section)  Progress towards OT goals: Progressing toward goals  Acute Rehab OT Goals OT Goal Formulation: With patient Time For Goal Achievement: 09/19/22 Potential to Achieve Goals: Fair  Plan Discharge plan remains appropriate;Frequency remains appropriate    Co-evaluation    PT/OT/SLP Co-Evaluation/Treatment: Yes Reason for Co-Treatment: For patient/therapist safety;To address functional/ADL transfers;Complexity of the patient's impairments (multi-system involvement) PT goals addressed during session: Mobility/safety with mobility;Strengthening/ROM;Proper use of DME;Balance OT goals addressed during session: Strengthening/ROM;Proper use of Adaptive equipment and DME;ADL's and self-care      AM-PAC OT "6 Clicks" Daily Activity     Outcome Measure   Help from another person eating meals?: A Little Help from another person taking care of personal grooming?: A Lot Help from another person toileting, which includes using toliet, bedpan, or urinal?: A Lot Help from another person bathing (including washing, rinsing, drying)?: A Lot Help from another person to put on and taking off regular upper body clothing?: A Lot Help from another person to put on and taking off regular lower body clothing?: A Lot 6 Click Score: 13    End of Session Equipment Utilized During Treatment: Oxygen  OT Visit Diagnosis: Muscle weakness (generalized) (M62.81);Unsteadiness on feet (R26.81);Other abnormalities of gait and mobility (R26.89);Pain Pain - part of body: Leg    Activity Tolerance Patient limited by lethargy;Other (comment);Patient limited by pain (Pt limited by SOB)   Patient Left in chair;with call bell/phone within reach;with nursing/sitter in room   Nurse Communication          Time: 4166-0630 OT Time Calculation (min): 33 min  Charges: OT General Charges $OT Visit: 1 Visit OT Treatments $Self Care/Home Management : 8-22 mins  Latina Craver, PhD, MS, OTR/L 09/06/22, 1:05 PM

## 2022-09-07 DIAGNOSIS — J9621 Acute and chronic respiratory failure with hypoxia: Secondary | ICD-10-CM | POA: Diagnosis not present

## 2022-09-07 LAB — GLUCOSE, CAPILLARY
Glucose-Capillary: 174 mg/dL — ABNORMAL HIGH (ref 70–99)
Glucose-Capillary: 193 mg/dL — ABNORMAL HIGH (ref 70–99)
Glucose-Capillary: 194 mg/dL — ABNORMAL HIGH (ref 70–99)
Glucose-Capillary: 204 mg/dL — ABNORMAL HIGH (ref 70–99)

## 2022-09-07 LAB — BASIC METABOLIC PANEL
Anion gap: 7 (ref 5–15)
BUN: 16 mg/dL (ref 8–23)
CO2: 34 mmol/L — ABNORMAL HIGH (ref 22–32)
Calcium: 8.5 mg/dL — ABNORMAL LOW (ref 8.9–10.3)
Chloride: 98 mmol/L (ref 98–111)
Creatinine, Ser: 0.83 mg/dL (ref 0.44–1.00)
GFR, Estimated: >60 mL/min (ref >60)
Glucose, Bld: 147 mg/dL — ABNORMAL HIGH (ref 70–99)
Potassium: 3.8 mmol/L (ref 3.5–5.1)
Sodium: 139 mmol/L (ref 135–145)

## 2022-09-07 LAB — CBC
HCT: 40.9 % (ref 36.0–46.0)
Hemoglobin: 13 g/dL (ref 12.0–15.0)
MCH: 28.9 pg (ref 26.0–34.0)
MCHC: 31.8 g/dL (ref 30.0–36.0)
MCV: 90.9 fL (ref 80.0–100.0)
Platelets: 118 10*3/uL — ABNORMAL LOW (ref 150–400)
RBC: 4.5 MIL/uL (ref 3.87–5.11)
RDW: 15.2 % (ref 11.5–15.5)
WBC: 14 10*3/uL — ABNORMAL HIGH (ref 4.0–10.5)
nRBC: 0 % (ref 0.0–0.2)

## 2022-09-07 LAB — MAGNESIUM: Magnesium: 2.2 mg/dL (ref 1.7–2.4)

## 2022-09-07 MED ORDER — FUROSEMIDE 10 MG/ML IJ SOLN
40.0000 mg | Freq: Every day | INTRAMUSCULAR | Status: DC
  Administered 2022-09-08 – 2022-09-11 (×4): 40 mg via INTRAVENOUS
  Filled 2022-09-07 (×4): qty 4

## 2022-09-07 MED ORDER — FUROSEMIDE 10 MG/ML IJ SOLN
40.0000 mg | Freq: Once | INTRAMUSCULAR | Status: AC
  Administered 2022-09-07: 40 mg via INTRAVENOUS
  Filled 2022-09-07: qty 4

## 2022-09-07 MED ORDER — INSULIN GLARGINE-YFGN 100 UNIT/ML ~~LOC~~ SOLN
10.0000 [IU] | Freq: Two times a day (BID) | SUBCUTANEOUS | Status: DC
  Administered 2022-09-07 – 2022-09-13 (×12): 10 [IU] via SUBCUTANEOUS
  Filled 2022-09-07 (×13): qty 0.1

## 2022-09-07 NOTE — Progress Notes (Signed)
  PROGRESS NOTE    Elizabeth Rowland  [id number]:[id number] DOB: 04/27/1941 DOA: 09/02/2022 PCP: Leanna Sato, MD  [id number]  LOS: 4 days   Brief hospital course:   Assessment & Plan: Elizabeth Rowland is a 81 y.o. female with medical history significant for PE on Eliquis, lymphedema, type 2 diabetes mellitus, who presented to the ER with acute onset of worsening dyspnea with associated cough productive of gray sputum as well as wheezing and fever and chills over the last couple of days.  She admits to mild abdominal discomfort but denies any nausea or vomiting or diarrhea or melena or bright red bleeding per rectum.  No chest pain or palpitations.  She has been on home O2 at 4 L/min after having COVID-19 infection last year.  She needed more oxygen today and initially was placed on BiPAP in the ER due to significant respiratory distress.    * Acute on chronic respiratory failure with hypoxia (HCC) - This is secondary to influenza A pneumonia.  On home 4L O2 PTA. --CXR also showed Cardiomegaly with diffuse bilateral interstitial pulmonary opacity, so possibly with pulm edema. --currently on 7L O2 Plan: --Continue supplemental O2 to keep sats >=90%, wean as tolerated --cont IV lasix 40 daily  Influenza and pneumonia --cont tamiflu --schedule DuoNeb  Acute on chronic thrombocytopenia  --plt 60.  Had thrombocytopenia back in May 2022, from 71-115.  Possibly due to acute illness. --monitor for now  Hypokalemia --monitor and replete PRN  Dyslipidemia --cont statin  History of pulmonary embolism --cont Eliquis  Type 2 diabetes mellitus with peripheral neuropathy (HCC) --increase glargine to 10u BID --ACHS and SSI  Essential hypertension - cont spironolactone --IV lasix 40 daily  Chronic pain on chronic opioids --cont home tramadol PRN  Morbid obesity, BMI: 41.01    DVT prophylaxis: FY:[id number] Code Status: Full code  Family Communication:  Level of care: Telemetry  Medical Dispo:   The patient is from: home Anticipated d/c is to: home Anticipated d/c date is: 2-3 days   Subjective and Interval History:  No change today.  Still needs 6-7L O2.   Objective: Vitals:   09/07/22 0832 09/07/22 1125 09/07/22 1559 09/07/22 1704  BP: (!) 125/99   (!) 123/59  Pulse: (!) 103   (!) 110  Resp: 16   16  Temp: 97.9 F (36.6 C)   97.8 F (36.6 C)  TempSrc:    Oral  SpO2: 97% 95% 95% 91%  Weight:      Height:        Intake/Output Summary (Last 24 hours) at 09/07/2022 1724 Last data filed at 09/07/2022 0900 Gross per 24 hour  Intake 240 ml  Output 800 ml  Net -560 ml   Filed Weights   09/03/22 0303  Weight: 105 kg    Examination:   Constitutional: NAD CV: No cyanosis.   RESP: normal respiratory effort, on 6L Extremities: edema and erythema improved in both legs and feet   Data Reviewed: I have personally reviewed labs and imaging studies  Time spent: 35 minutes  Darlin Priestly, MD [facility] If 7PM-7AM, please contact night-coverage 09/07/2022, 5:24 PM

## 2022-09-08 DIAGNOSIS — J9621 Acute and chronic respiratory failure with hypoxia: Secondary | ICD-10-CM | POA: Diagnosis not present

## 2022-09-08 LAB — BASIC METABOLIC PANEL
Anion gap: 9 (ref 5–15)
BUN: 19 mg/dL (ref 8–23)
CO2: 31 mmol/L (ref 22–32)
Calcium: 8.2 mg/dL — ABNORMAL LOW (ref 8.9–10.3)
Chloride: 95 mmol/L — ABNORMAL LOW (ref 98–111)
Creatinine, Ser: 0.83 mg/dL (ref 0.44–1.00)
GFR, Estimated: >60 mL/min (ref >60)
Glucose, Bld: 163 mg/dL — ABNORMAL HIGH (ref 70–99)
Potassium: 3.4 mmol/L — ABNORMAL LOW (ref 3.5–5.1)
Sodium: 135 mmol/L (ref 135–145)

## 2022-09-08 LAB — CBC
HCT: 41.3 % (ref 36.0–46.0)
Hemoglobin: 13.1 g/dL (ref 12.0–15.0)
MCH: 28.7 pg (ref 26.0–34.0)
MCHC: 31.7 g/dL (ref 30.0–36.0)
MCV: 90.4 fL (ref 80.0–100.0)
Platelets: 176 10*3/uL (ref 150–400)
RBC: 4.57 MIL/uL (ref 3.87–5.11)
RDW: 15.4 % (ref 11.5–15.5)
WBC: 18.5 10*3/uL — ABNORMAL HIGH (ref 4.0–10.5)
nRBC: 0.1 % (ref 0.0–0.2)

## 2022-09-08 LAB — GLUCOSE, CAPILLARY
Glucose-Capillary: 163 mg/dL — ABNORMAL HIGH (ref 70–99)
Glucose-Capillary: 185 mg/dL — ABNORMAL HIGH (ref 70–99)
Glucose-Capillary: 181 mg/dL — ABNORMAL HIGH (ref 70–99)
Glucose-Capillary: 262 mg/dL — ABNORMAL HIGH (ref 70–99)

## 2022-09-08 LAB — MAGNESIUM: Magnesium: 2.3 mg/dL (ref 1.7–2.4)

## 2022-09-08 MED ORDER — IPRATROPIUM-ALBUTEROL 0.5-2.5 (3) MG/3ML IN SOLN
3.0000 mL | Freq: Four times a day (QID) | RESPIRATORY_TRACT | Status: DC
  Administered 2022-09-08: 3 mL via RESPIRATORY_TRACT
  Filled 2022-09-08: qty 3

## 2022-09-08 MED ORDER — IPRATROPIUM-ALBUTEROL 0.5-2.5 (3) MG/3ML IN SOLN
3.0000 mL | Freq: Three times a day (TID) | RESPIRATORY_TRACT | Status: DC
  Administered 2022-09-08 – 2022-09-10 (×5): 3 mL via RESPIRATORY_TRACT
  Filled 2022-09-08 (×5): qty 3

## 2022-09-08 NOTE — Progress Notes (Signed)
  PROGRESS NOTE    Elizabeth Rowland  LFY:[id number] DOB: 17-Oct-1940 DOA: 09/02/2022 PCP: Leanna Sato, MD  [id number]  LOS: 5 days   Brief hospital course:   Assessment & Plan: Elizabeth Rowland is a 81 y.o. female with medical history significant for PE on Eliquis, lymphedema, type 2 diabetes mellitus, who presented to the ER with acute onset of worsening dyspnea with associated cough productive of gray sputum as well as wheezing and fever and chills over the last couple of days.  She admits to mild abdominal discomfort but denies any nausea or vomiting or diarrhea or melena or bright red bleeding per rectum.  No chest pain or palpitations.  She has been on home O2 at 4 L/min after having COVID-19 infection last year.  She needed more oxygen today and initially was placed on BiPAP in the ER due to significant respiratory distress.    * Acute on chronic respiratory failure with hypoxia (HCC) - This is secondary to influenza A pneumonia.  On home 4L O2 PTA. --CXR also showed Cardiomegaly with diffuse bilateral interstitial pulmonary opacity, so possibly with pulm edema. --currently on 7L O2 Plan: --Continue supplemental O2 to keep sats >=90%, wean as tolerated --cont IV lasix 40 daily  Influenza and pneumonia --cont tamiflu --schedule DuoNeb  Acute on chronic thrombocytopenia  --plt 60.  Had thrombocytopenia back in May 2022, from 71-115.  Possibly due to acute illness. --monitor for now  Hypokalemia --monitor and replete PRN  Dyslipidemia --cont statin  History of pulmonary embolism --cont Eliquis  Type 2 diabetes mellitus with peripheral neuropathy (HCC) --cont glargine to 10u BID --ACHS and SSI  Essential hypertension - cont spironolactone --IV lasix 40 daily  Chronic pain on chronic opioids --cont home tramadol PRN  Morbid obesity, BMI: 41.01    DVT prophylaxis: EN:IDPOEUM Code Status: Full code  Family Communication:  Level of care: Telemetry  Medical Dispo:   The patient is from: home Anticipated d/c is to: home, pt refused SNF Anticipated d/c date is: undetermined   Subjective and Interval History:  Pt appeared in no respiratory distress, but O2 requirement could not be weaned down, remained at 7L.   Objective: Vitals:   09/08/22 0738 09/08/22 0900 09/08/22 1344 09/08/22 1545  BP:  (!) 121/59  (!) 140/73  Pulse:  87  99  Resp:    16  Temp:  98.5 F (36.9 C)  98 F (36.7 C)  TempSrc:    Oral  SpO2: 90% 94% 92% 92%  Weight:      Height:        Intake/Output Summary (Last 24 hours) at 09/08/2022 1721 Last data filed at 09/08/2022 3536 Gross per 24 hour  Intake --  Output 400 ml  Net -400 ml   Filed Weights   09/03/22 0303  Weight: 105 kg    Examination:   Constitutional: NAD, AAOx3 HEENT: conjunctivae and lids normal, EOMI CV: No cyanosis.   RESP: normal respiratory effort, on 7L Neuro: II - XII grossly intact.     Data Reviewed: I have personally reviewed labs and imaging studies  Time spent: 25 minutes  Darlin Priestly, MD [facility] If 7PM-7AM, please contact night-coverage 09/08/2022, 5:21 PM

## 2022-09-09 DIAGNOSIS — J9621 Acute and chronic respiratory failure with hypoxia: Secondary | ICD-10-CM | POA: Diagnosis not present

## 2022-09-09 LAB — BASIC METABOLIC PANEL
Anion gap: 9 (ref 5–15)
BUN: 22 mg/dL (ref 8–23)
CO2: 35 mmol/L — ABNORMAL HIGH (ref 22–32)
Calcium: 8.2 mg/dL — ABNORMAL LOW (ref 8.9–10.3)
Chloride: 95 mmol/L — ABNORMAL LOW (ref 98–111)
Creatinine, Ser: 0.88 mg/dL (ref 0.44–1.00)
GFR, Estimated: >60 mL/min (ref >60)
Glucose, Bld: 143 mg/dL — ABNORMAL HIGH (ref 70–99)
Potassium: 3.4 mmol/L — ABNORMAL LOW (ref 3.5–5.1)
Sodium: 139 mmol/L (ref 135–145)

## 2022-09-09 LAB — CBC
HCT: 41.4 % (ref 36.0–46.0)
Hemoglobin: 13 g/dL (ref 12.0–15.0)
MCH: 28.4 pg (ref 26.0–34.0)
MCHC: 31.4 g/dL (ref 30.0–36.0)
MCV: 90.6 fL (ref 80.0–100.0)
Platelets: 234 10*3/uL (ref 150–400)
RBC: 4.57 MIL/uL (ref 3.87–5.11)
RDW: 15.4 % (ref 11.5–15.5)
WBC: 19.5 10*3/uL — ABNORMAL HIGH (ref 4.0–10.5)
nRBC: 0.2 % (ref 0.0–0.2)

## 2022-09-09 LAB — GLUCOSE, CAPILLARY
Glucose-Capillary: 160 mg/dL — ABNORMAL HIGH (ref 70–99)
Glucose-Capillary: 219 mg/dL — ABNORMAL HIGH (ref 70–99)
Glucose-Capillary: 194 mg/dL — ABNORMAL HIGH (ref 70–99)
Glucose-Capillary: 232 mg/dL — ABNORMAL HIGH (ref 70–99)

## 2022-09-09 LAB — MAGNESIUM: Magnesium: 2.4 mg/dL (ref 1.7–2.4)

## 2022-09-09 MED ORDER — SODIUM CHLORIDE 0.9 % IV SOLN
1.0000 g | INTRAVENOUS | Status: DC
  Administered 2022-09-09 – 2022-09-12 (×4): 1 g via INTRAVENOUS
  Filled 2022-09-09 (×5): qty 10

## 2022-09-09 MED ORDER — POTASSIUM CHLORIDE CRYS ER 20 MEQ PO TBCR
40.0000 meq | EXTENDED_RELEASE_TABLET | Freq: Once | ORAL | Status: AC
  Administered 2022-09-09: 40 meq via ORAL
  Filled 2022-09-09: qty 2

## 2022-09-09 MED ORDER — AZITHROMYCIN 250 MG PO TABS
500.0000 mg | ORAL_TABLET | Freq: Every day | ORAL | Status: DC
  Administered 2022-09-09 – 2022-09-13 (×5): 500 mg via ORAL
  Filled 2022-09-09 (×5): qty 2

## 2022-09-09 NOTE — Progress Notes (Signed)
  PROGRESS NOTE    Elizabeth Rowland  [id number]:[id number] DOB: 01/09/1941 DOA: 09/02/2022 PCP: Leanna Sato, MD  [id number]  LOS: 6 days   Brief hospital course:   Assessment & Plan: Elizabeth Rowland is a 81 y.o. female with medical history significant for PE on Eliquis, lymphedema, type 2 diabetes mellitus, who presented to the ER with acute onset of worsening dyspnea with associated cough productive of gray sputum as well as wheezing and fever and chills over the last couple of days.  She admits to mild abdominal discomfort but denies any nausea or vomiting or diarrhea or melena or bright red bleeding per rectum.  No chest pain or palpitations.  She has been on home O2 at 4 L/min after having COVID-19 infection last year.  She needed more oxygen today and initially was placed on BiPAP in the ER due to significant respiratory distress.    * Acute on chronic respiratory failure with hypoxia (HCC) - This is secondary to influenza A.  On home 4L O2 PTA. --CXR also showed Cardiomegaly with diffuse bilateral interstitial pulmonary opacity, so possibly with pulm edema. --currently on 7L O2, can not wean down.  Due to increasing WBC, will start tx for PNA. Plan: --Continue supplemental O2 to keep sats >=90%, wean as tolerated --cont IV lasix 40 daily --start abx for empiric tx of PNA  Influenza and pneumonia --completed Tamiflu --DuoNeb scheduled  Possible PNA --WBC has been trending up, also unable to wean down supplemental O2. --start ceftriaxone and azithromycin  Acute on chronic thrombocytopenia  --plt 60.  Had thrombocytopenia back in May 2022, from 71-115.  Possibly due to acute illness. 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  O2 still at 7L.   Objective: Vitals:   09/09/22 0749 09/09/22 1435 09/09/22 1438 09/09/22 1630  BP: 125/67   120/70  Pulse: (!) 102   99  Resp: 18   18  Temp: 98 F (36.7 C)   98 F (36.7 C)  TempSrc: Oral   Oral  SpO2: 95% (!) 86% 91% 95%  Weight:      Height:        Intake/Output Summary (Last 24 hours) at 09/09/2022 1846 Last data filed at 09/09/2022 1100 Gross per 24 hour  Intake --  Output 1200 ml  Net -1200 ml   Filed Weights   09/03/22 0303  Weight: 105 kg    Examination:   Constitutional: NAD, AAOx3 HEENT: conjunctivae and lids normal, EOMI CV: No cyanosis.   RESP: normal respiratory effort, on 7L SKIN: warm, dry Neuro: II - XII grossly intact.     Data Reviewed: I have personally reviewed labs and imaging studies  Time spent: 35 minutes  Darlin Priestly, MD [facility] If 7PM-7AM, please contact night-coverage 09/09/2022, 6:46 PM

## 2022-09-10 DIAGNOSIS — J9621 Acute and chronic respiratory failure with hypoxia: Secondary | ICD-10-CM | POA: Diagnosis not present

## 2022-09-10 LAB — BASIC METABOLIC PANEL
Anion gap: 10 (ref 5–15)
BUN: 22 mg/dL (ref 8–23)
CO2: 34 mmol/L — ABNORMAL HIGH (ref 22–32)
Calcium: 8.3 mg/dL — ABNORMAL LOW (ref 8.9–10.3)
Chloride: 92 mmol/L — ABNORMAL LOW (ref 98–111)
Creatinine, Ser: 0.83 mg/dL (ref 0.44–1.00)
GFR, Estimated: >60 mL/min (ref >60)
Glucose, Bld: 120 mg/dL — ABNORMAL HIGH (ref 70–99)
Potassium: 3.2 mmol/L — ABNORMAL LOW (ref 3.5–5.1)
Sodium: 136 mmol/L (ref 135–145)

## 2022-09-10 LAB — CBC
HCT: 39.9 % (ref 36.0–46.0)
Hemoglobin: 12.9 g/dL (ref 12.0–15.0)
MCH: 29.1 pg (ref 26.0–34.0)
MCHC: 32.3 g/dL (ref 30.0–36.0)
MCV: 90.1 fL (ref 80.0–100.0)
Platelets: 266 10*3/uL (ref 150–400)
RBC: 4.43 MIL/uL (ref 3.87–5.11)
RDW: 15.2 % (ref 11.5–15.5)
WBC: 18.1 10*3/uL — ABNORMAL HIGH (ref 4.0–10.5)
nRBC: 0.2 % (ref 0.0–0.2)

## 2022-09-10 LAB — GLUCOSE, CAPILLARY
Glucose-Capillary: 150 mg/dL — ABNORMAL HIGH (ref 70–99)
Glucose-Capillary: 279 mg/dL — ABNORMAL HIGH (ref 70–99)
Glucose-Capillary: 180 mg/dL — ABNORMAL HIGH (ref 70–99)
Glucose-Capillary: 203 mg/dL — ABNORMAL HIGH (ref 70–99)

## 2022-09-10 LAB — MAGNESIUM: Magnesium: 2.3 mg/dL (ref 1.7–2.4)

## 2022-09-10 LAB — PROCALCITONIN: Procalcitonin: 0.13 ng/mL

## 2022-09-10 MED ORDER — POTASSIUM CHLORIDE CRYS ER 20 MEQ PO TBCR
40.0000 meq | EXTENDED_RELEASE_TABLET | Freq: Every day | ORAL | Status: DC
  Administered 2022-09-10 – 2022-09-11 (×2): 40 meq via ORAL
  Filled 2022-09-10 (×2): qty 2

## 2022-09-10 MED ORDER — INSULIN ASPART 100 UNIT/ML IJ SOLN
3.0000 [IU] | Freq: Three times a day (TID) | INTRAMUSCULAR | Status: DC
  Administered 2022-09-11 (×2): 3 [IU] via SUBCUTANEOUS

## 2022-09-10 MED ORDER — IPRATROPIUM-ALBUTEROL 0.5-2.5 (3) MG/3ML IN SOLN: 3.0000 mL | RESPIRATORY_TRACT | Status: DC | PRN

## 2022-09-10 NOTE — Inpatient Diabetes Management (Addendum)
Inpatient Diabetes Program Recommendations  AACE/ADA: New Consensus Statement on Inpatient Glycemic Control (2015)  Target Ranges:  Prepandial:   less than 140 mg/dL      Peak postprandial:   less than 180 mg/dL (1-2 hours)      Critically ill patients:  140 - 180 mg/dL    Latest Reference Range & Units 09/09/22 07:49 09/09/22 11:14 09/09/22 17:01 09/09/22 21:27  Glucose-Capillary 70 - 99 mg/dL 017 (H)  2 units Novolog  10 units Semglee  219 (H)  3 units Novolog  194 (H)  2 units Novolog  232 (H)     10 units Semglee  (H): Data is abnormally high   Latest Reference Range & Units 09/10/22 07:39 09/10/22 11:29  Glucose-Capillary 70 - 99 mg/dL 510 (H)  1 unit Novolog @0957   10 units Semglee @0957  279 (H)  5 units Novolog @1254   (H): Data is abnormally high     Home DM Meds: Lantus 10 units BID   Current Orders: Semglee 10 units BID      Novolog Sensitive Correction Scale/ SSI (0-9 units) TID AC   MD- If PO intake OK, please add Novolog Meal Coverage:  Novolog 3 units TID with meals HOLD if pt NPO HOLD if pt eats <50% meals   --Will follow patient during hospitalization--  RN, MSN, CDCES Diabetes Coordinator Inpatient Glycemic Control Team Team Pager: [phone] (8a-5p)

## 2022-09-10 NOTE — Progress Notes (Signed)
Physical Therapy Treatment Patient Details Name: Elizabeth Rowland MRN: [id number] DOB: 11/22/1940 Today's Date: 09/10/2022   History of Present Illness Pt is an 81 yo F who comes to [facility] on 12/18 c acute onset SOB.  MD assessment includes acute on chronic respiratory failure with hypoxia, influenza, pneumonia, acute on chronic thrombocytopenia, and hypokalemia. PMH includes: PE on Eliquis, lymphedema, type 2 diabetes mellitus.    PT Comments    Pt was pleasant and motivated to participate during the session and put forth good effort throughout.  Pt found on 8.5LO2/min with SpO2 >/= 88% and HR WNL throughout the session measured frequently.  Pt required extensive physical assistance with bed mobility tasks but only min A with cues for sequencing during sit to/from stand transfers.  Pt was able to amb 2 x 2 feet at the EOB with very slow, effortful cadence and reported not feeling safe to attempt to get to chair secondary to weakness.  Pt will benefit from PT services in a SNF setting upon discharge to safely address deficits listed in patient problem list for decreased caregiver assistance and eventual return to PLOF.     Recommendations for follow up therapy are one component of a multi-disciplinary discharge planning process, led by the attending physician.  Recommendations may be updated based on patient status, additional functional criteria and insurance authorization.  Follow Up Recommendations  Skilled nursing-short term rehab (<3 hours/day) Can patient physically be transported by private vehicle: No   Assistance Recommended at Discharge Frequent or constant Supervision/Assistance  Patient can return home with the following Two people to help with bathing/dressing/bathroom;Two people to help with walking and/or transfers;Help with stairs or ramp for entrance;Assist for transportation;Direct supervision/assist for financial management   Equipment Recommendations  None recommended by  PT    Recommendations for Other Services       Precautions / Restrictions Precautions Precautions: Fall Restrictions Weight Bearing Restrictions: No     Mobility  Bed Mobility Overal bed mobility: Needs Assistance Bed Mobility: Supine to Sit     Supine to sit: Mod assist, +2 for physical assistance Sit to supine: +2 for physical assistance, Max assist   General bed mobility comments: +2 assist for BLE and trunk control with cues for sequencing    Transfers Overall transfer level: Needs assistance Equipment used: Rolling walker (2 wheels) Transfers: Sit to/from Stand Sit to Stand: +2 physical assistance, Min assist, From elevated surface           General transfer comment: Mod verbal cues for hand placement and increased trunk flexion    Ambulation/Gait Ambulation/Gait assistance: Min guard, +2 safety/equipment Gait Distance (Feet): 2 Feet Assistive device: Rolling walker (2 wheels) Gait Pattern/deviations: Step-to pattern, Trunk flexed, Decreased step length - right, Decreased step length - left, Shuffle Gait velocity: decreased     General Gait Details: Pt able to take several very small, slow, effortful, shuffling steps at the EOB with close CGA with no buckling or LOB   Stairs             Wheelchair Mobility    Modified Rankin (Stroke Patients Only)       Balance Overall balance assessment: Needs assistance Sitting-balance support: Feet supported, Bilateral upper extremity supported Sitting balance-Leahy Scale: Fair     Standing balance support: Bilateral upper extremity supported, Reliant on assistive device for balance Standing balance-Leahy Scale: Fair  Cognition Arousal/Alertness: Awake/alert Behavior During Therapy: Flat affect Overall Cognitive Status: Within Functional Limits for tasks assessed                                          Exercises Total Joint  Exercises Ankle Circles/Pumps: AROM, Strengthening, Both, 10 reps Hip ABduction/ADduction: AAROM, Strengthening, Both, 10 reps Long Arc Quad: Strengthening, Both, 10 reps Knee Flexion: Strengthening, Both, 10 reps Other Exercises Other Exercises: Pt education for PLB PRN during the session    General Comments        Pertinent Vitals/Pain Pain Assessment Pain Assessment: No/denies pain    Home Living                          Prior Function            PT Goals (current goals can now be found in the care plan section) Progress towards PT goals: Progressing toward goals    Frequency    Min 2X/week      PT Plan Current plan remains appropriate    Co-evaluation              AM-PAC PT "6 Clicks" Mobility   Outcome Measure  Help needed turning from your back to your side while in a flat bed without using bedrails?: A Lot Help needed moving from lying on your back to sitting on the side of a flat bed without using bedrails?: A Lot Help needed moving to and from a bed to a chair (including a wheelchair)?: A Lot Help needed standing up from a chair using your arms (e.g., wheelchair or bedside chair)?: A Little Help needed to walk in hospital room?: Total Help needed climbing 3-5 steps with a railing? : Total 6 Click Score: 11    End of Session Equipment Utilized During Treatment: Gait belt;Oxygen Activity Tolerance: Patient tolerated treatment well Patient left: in bed;with call bell/phone within reach;with bed alarm set;with family/visitor present Nurse Communication: Mobility status PT Visit Diagnosis: Difficulty in walking, not elsewhere classified (R26.2);Other abnormalities of gait and mobility (R26.89)     Time: 4656-8127 PT Time Calculation (min) (ACUTE ONLY): 27 min  Charges:  $Therapeutic Exercise: 8-22 mins $Therapeutic Activity: 8-22 mins                    D. Scott Syrena Burges PT, DPT 09/10/22, 5:14 PM

## 2022-09-10 NOTE — Progress Notes (Signed)
  PROGRESS NOTE    Elizabeth Rowland  LTJ:[id number] DOB: 21-Apr-1941 DOA: 09/02/2022 PCP: Leanna Sato, MD  [id number]  LOS: 7 days   Brief hospital course:   Assessment & Plan: Elizabeth Rowland is a 81 y.o. female with medical history significant for PE on Eliquis, lymphedema, type 2 diabetes mellitus, who presented to the ER with acute onset of worsening dyspnea with associated cough productive of gray sputum as well as wheezing and fever and chills over the last couple of days.    She has been on home O2 at 4 L/min after having COVID-19 infection last year.  Initially was placed on BiPAP in the ER due to significant respiratory distress.    * Acute on chronic respiratory failure with hypoxia (HCC) - This is secondary to influenza A.  On home 4L O2 PTA. --CXR also showed Cardiomegaly with diffuse bilateral interstitial pulmonary opacity, so possibly with pulm edema. --persistently on 7-8L O2, can not wean down.  Due to increasing WBC, started tx for PNA. Plan: --Continue supplemental O2 to keep sats >=90%, wean as tolerated --cont IV lasix 40 daily  Influenza and pneumonia --completed Tamiflu --DuoNeb scheduled  Possible PNA --WBC has been trending up, also unable to wean down supplemental O2. --started ceftriaxone and azithromycin on 12/25 --cont ceftriaxone and azithromycin  Acute on chronic thrombocytopenia  --plt 60.  Had thrombocytopenia back in May 2022, from 71-115.  Possibly due to acute illness. 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   Objective: Vitals:   09/10/22 0726 09/10/22 0740 09/10/22 1600 09/10/22 2144  BP:  137/69 (!) 108/50 137/73  Pulse: 83 99 62 94  Resp: 18 18 18 18   Temp:  (!) 97.3 F (36.3 C) (!) 97.5 F (36.4 C) 97.8 F (36.6 C)  TempSrc:  Oral Oral Oral  SpO2: 93% (!) 89% 94%   Weight:      Height:        Intake/Output Summary (Last 24 hours) at 09/10/2022 2159 Last data filed at 09/10/2022 1500 Gross per 24 hour  Intake 100 ml  Output 1550 ml  Net -1450 ml   Filed Weights   09/03/22 0303  Weight: 105 kg    Examination:   Constitutional: NAD, AAOx3 HEENT: conjunctivae and lids normal, EOMI CV: No cyanosis.   RESP: normal respiratory effort, [place] off SKIN: warm, dry Neuro: II - XII grossly intact.     Data Reviewed: I have personally reviewed labs and imaging studies  Time spent: 35 minutes  09/05/22, MD [facility] If 7PM-7AM, please contact night-coverage 09/10/2022, 9:59 PM

## 2022-09-11 DIAGNOSIS — E1142 Type 2 diabetes mellitus with diabetic polyneuropathy: Secondary | ICD-10-CM

## 2022-09-11 DIAGNOSIS — J1100 Influenza due to unidentified influenza virus with unspecified type of pneumonia: Secondary | ICD-10-CM | POA: Diagnosis not present

## 2022-09-11 DIAGNOSIS — J9621 Acute and chronic respiratory failure with hypoxia: Secondary | ICD-10-CM | POA: Diagnosis not present

## 2022-09-11 LAB — BASIC METABOLIC PANEL
Anion gap: 8 (ref 5–15)
BUN: 21 mg/dL (ref 8–23)
CO2: 35 mmol/L — ABNORMAL HIGH (ref 22–32)
Calcium: 8.4 mg/dL — ABNORMAL LOW (ref 8.9–10.3)
Chloride: 95 mmol/L — ABNORMAL LOW (ref 98–111)
Creatinine, Ser: 0.88 mg/dL (ref 0.44–1.00)
GFR, Estimated: >60 mL/min (ref >60)
Glucose, Bld: 124 mg/dL — ABNORMAL HIGH (ref 70–99)
Potassium: 3.7 mmol/L (ref 3.5–5.1)
Sodium: 138 mmol/L (ref 135–145)

## 2022-09-11 LAB — CBC
HCT: 41.9 % (ref 36.0–46.0)
Hemoglobin: 13.2 g/dL (ref 12.0–15.0)
MCH: 28.8 pg (ref 26.0–34.0)
MCHC: 31.5 g/dL (ref 30.0–36.0)
MCV: 91.5 fL (ref 80.0–100.0)
Platelets: 288 10*3/uL (ref 150–400)
RBC: 4.58 MIL/uL (ref 3.87–5.11)
RDW: 15.2 % (ref 11.5–15.5)
WBC: 18.3 10*3/uL — ABNORMAL HIGH (ref 4.0–10.5)
nRBC: 0 % (ref 0.0–0.2)

## 2022-09-11 LAB — GLUCOSE, CAPILLARY
Glucose-Capillary: 203 mg/dL — ABNORMAL HIGH (ref 70–99)
Glucose-Capillary: 197 mg/dL — ABNORMAL HIGH (ref 70–99)
Glucose-Capillary: 221 mg/dL — ABNORMAL HIGH (ref 70–99)
Glucose-Capillary: 207 mg/dL — ABNORMAL HIGH (ref 70–99)

## 2022-09-11 LAB — MAGNESIUM: Magnesium: 2.3 mg/dL (ref 1.7–2.4)

## 2022-09-11 LAB — BRAIN NATRIURETIC PEPTIDE: B Natriuretic Peptide: 35.9 pg/mL (ref 0.0–100.0)

## 2022-09-11 MED ORDER — INSULIN ASPART 100 UNIT/ML IJ SOLN
4.0000 [IU] | Freq: Three times a day (TID) | INTRAMUSCULAR | Status: DC
  Administered 2022-09-11 – 2022-09-13 (×6): 4 [IU] via SUBCUTANEOUS
  Filled 2022-09-11 (×5): qty 1

## 2022-09-11 MED ORDER — IPRATROPIUM-ALBUTEROL 0.5-2.5 (3) MG/3ML IN SOLN: 3.0000 mL | Freq: Four times a day (QID) | RESPIRATORY_TRACT | Status: DC

## 2022-09-11 MED ORDER — IPRATROPIUM-ALBUTEROL 0.5-2.5 (3) MG/3ML IN SOLN
3.0000 mL | Freq: Four times a day (QID) | RESPIRATORY_TRACT | Status: DC
  Administered 2022-09-11 – 2022-09-13 (×8): 3 mL via RESPIRATORY_TRACT
  Filled 2022-09-11 (×9): qty 3

## 2022-09-11 NOTE — Progress Notes (Signed)
Physical Therapy Treatment Patient Details Name: Elizabeth Rowland MRN: [id number] DOB: 1940/10/08 Today's Date: 09/11/2022   History of Present Illness Pt is an 81 yo F who comes to [facility] on 12/18 c acute onset SOB.  MD assessment includes acute on chronic respiratory failure with hypoxia, influenza, pneumonia, acute on chronic thrombocytopenia, and hypokalemia. PMH includes: PE on Eliquis, lymphedema, type 2 diabetes mellitus.    PT Comments    Pt pleasant and put forth good effort during the session but limited by very poor activity tolerance.  Pt found on 8LO2/min with SpO2 generally in the low 90s and dropping to a low of 86-87% after ambulation, quickly increasing back to the low 90s at rest.  Pt required significant physical assistance during the session with all functional tasks and was only able to amb several very small, shuffling steps before needing to return to sitting secondary to fatigue.  Pt will benefit from PT services in a SNF setting upon discharge to safely address deficits listed in patient problem list for decreased caregiver assistance and eventual return to PLOF.    Recommendations for follow up therapy are one component of a multi-disciplinary discharge planning process, led by the attending physician.  Recommendations may be updated based on patient status, additional functional criteria and insurance authorization.  Follow Up Recommendations  Skilled nursing-short term rehab (<3 hours/day) Can patient physically be transported by private vehicle: No   Assistance Recommended at Discharge Frequent or constant Supervision/Assistance  Patient can return home with the following Two people to help with bathing/dressing/bathroom;Two people to help with walking and/or transfers;Help with stairs or ramp for entrance;Assist for transportation;Direct supervision/assist for financial management   Equipment Recommendations  None recommended by PT    Recommendations for Other  Services       Precautions / Restrictions Precautions Precautions: Fall Restrictions Weight Bearing Restrictions: No     Mobility  Bed Mobility Overal bed mobility: Needs Assistance Bed Mobility: Supine to Sit, Sit to Supine     Supine to sit: Mod assist, +2 for physical assistance Sit to supine: +2 for physical assistance, Max assist   General bed mobility comments: +2 assist for BLE and trunk control with cues for sequencing and use of bed rail    Transfers Overall transfer level: Needs assistance Equipment used: Rolling walker (2 wheels) Transfers: Sit to/from Stand Sit to Stand: Mod assist, From elevated surface           General transfer comment: Mod verbal cues for hand placement and increased trunk flexion    Ambulation/Gait Ambulation/Gait assistance: Min assist Gait Distance (Feet): 1 Feet Assistive device: Rolling walker (2 wheels) Gait Pattern/deviations: Step-to pattern, Trunk flexed, Decreased step length - right, Decreased step length - left, Shuffle Gait velocity: decreased     General Gait Details: Pt only able to take 2-3 very small, shuffling steps at the EOB before needing to return to sitting secondary to fatigue   Stairs             Wheelchair Mobility    Modified Rankin (Stroke Patients Only)       Balance Overall balance assessment: Needs assistance Sitting-balance support: Feet supported, Bilateral upper extremity supported Sitting balance-Leahy Scale: Fair     Standing balance support: Bilateral upper extremity supported, Reliant on assistive device for balance, During functional activity Standing balance-Leahy Scale: Fair  Cognition Arousal/Alertness: Awake/alert Behavior During Therapy: Flat affect Overall Cognitive Status: Within Functional Limits for tasks assessed                                          Exercises Total Joint Exercises Ankle  Circles/Pumps: AROM, Strengthening, Both, 10 reps Quad Sets: Strengthening, Both, 10 reps Gluteal Sets: Strengthening, Both, 10 reps Hip ABduction/ADduction: AAROM, Strengthening, Both, 10 reps Long Arc Quad: Strengthening, Both, 10 reps Knee Flexion: Strengthening, Both, 10 reps Other Exercises Other Exercises: Static sitting at EOB x 8 min for core therex and increased activity tolerance    General Comments        Pertinent Vitals/Pain Pain Assessment Pain Assessment: 0-10 Pain Score: 6  Pain Location: Bilateral knees Pain Descriptors / Indicators: Sore Pain Intervention(s): Premedicated before session, Repositioned, Monitored during session    Home Living                          Prior Function            PT Goals (current goals can now be found in the care plan section) Progress towards PT goals: Not progressing toward goals - comment (limited by functional weakness and very poor activity tolerance)    Frequency    Min 2X/week      PT Plan Current plan remains appropriate    Co-evaluation              AM-PAC PT "6 Clicks" Mobility   Outcome Measure  Help needed turning from your back to your side while in a flat bed without using bedrails?: A Lot Help needed moving from lying on your back to sitting on the side of a flat bed without using bedrails?: A Lot Help needed moving to and from a bed to a chair (including a wheelchair)?: A Lot Help needed standing up from a chair using your arms (e.g., wheelchair or bedside chair)?: A Lot Help needed to walk in hospital room?: Total Help needed climbing 3-5 steps with a railing? : Total 6 Click Score: 10    End of Session Equipment Utilized During Treatment: Gait belt;Oxygen Activity Tolerance: Patient tolerated treatment well Patient left: in bed;with call bell/phone within reach;with bed alarm set Nurse Communication: Mobility status PT Visit Diagnosis: Difficulty in walking, not elsewhere  classified (R26.2);Other abnormalities of gait and mobility (R26.89)     Time: 8159-4707 PT Time Calculation (min) (ACUTE ONLY): 24 min  Charges:  $Therapeutic Exercise: 8-22 mins $Therapeutic Activity: 8-22 mins                     D. Scott Anye Brose PT, DPT 09/11/22, 3:41 PM

## 2022-09-11 NOTE — Progress Notes (Signed)
  Progress Note   Patient: Elizabeth Rowland KVQ:[id number] DOB: [birth year] DOA: 09/02/2022     8 DOS: the patient was seen and examined on 09/11/2022   Brief hospital course: Elizabeth Rowland is a 81 y.o. female with medical history significant for PE on Eliquis, lymphedema, type 2 diabetes mellitus, who presented to the ER with acute onset of worsening dyspnea with associated cough productive of gray sputum as well as wheezing and fever and chills over the last couple of days.     She has been on home O2 at 4 L/min after having COVID-19 infection last year.  Initially was placed on BiPAP in the ER due to significant respiratory distress.   Patient was requiring 8 L oxygen after arriving the hospital.  She completed Tamiflu.  She was also started on antibiotics for the last 3 days for possible bacterial pneumonia.  She is improving very slowly.  Assessment and Plan: * Acute on chronic respiratory failure with hypoxia (HCC) Influenza and pneumonia Secondary bacterial pneumonia. Patient does not have volume overload, BNP normal, will discontinue IV Lasix. Patient is a bar slowly recovering, still requiring 8 L oxygen. She still has crackles in the base, more on the right side.  But she does not have any dysphagia or choking episodes. For now, I will continue antibiotics, wean down oxygen. Add incentive spirometer and flutter valve.  Schedule bronchodilator. Increase ambulation.   Hypokalemia Improved  Dyslipidemia Statin.   History of pulmonary embolism On Eliquis  Type 2 diabetes mellitus with peripheral neuropathy (HCC) Glucose still running high at daytime, not high early morning.  Slightly increase NovoLog dose.  Essential hypertension - We will place him on as needed IV labetalol.       Subjective:  Patient still on 8 L oxygen, short of breath with exertion.  No cough.  No wheezing.  Physical Exam: Vitals:   09/10/22 1600 09/10/22 2144 09/11/22 0454 09/11/22  0749  BP: (!) 108/50 137/73 (!) 165/60 131/64  Pulse: 62 94 90 94  Resp: 18 18 20 18   Temp: (!) 97.5 F (36.4 C) 97.8 F (36.6 C) 98.4 F (36.9 C) 97.8 F (36.6 C)  TempSrc: Oral Oral Oral Oral  SpO2: 94% 93% 93% 100%  Weight:      Height:       General exam: Appears calm and comfortable  Respiratory system: Decreased breathing sounds. Respiratory effort normal. Cardiovascular system: S1 & S2 heard, RRR. No JVD, murmurs, rubs, gallops or clicks. No pedal edema. Gastrointestinal system: Abdomen is nondistended, soft and nontender. No organomegaly or masses felt. Normal bowel sounds heard. Central nervous system: Alert and oriented. No focal neurological deficits. Extremities: Symmetric 5 x 5 power. Skin: No rashes, lesions or ulcers Psychiatry:  Mood & affect appropriate.   Data Reviewed:  Lab results and chest x-ray reviewed.  Family Communication: son updated over the phone.   Disposition: Status is: Inpatient Remains inpatient appropriate because: son updated  Planned Discharge Destination: Skilled nursing facility    Time spent: 35 minutes  Author: , MD 09/11/2022 12:39 PM  For on call review [email or web address].

## 2022-09-11 NOTE — Hospital Course (Signed)
Elizabeth Rowland is a 81 y.o. female with medical history significant for PE on Eliquis, lymphedema, type 2 diabetes mellitus, who presented to the ER with acute onset of worsening dyspnea with associated cough productive of gray sputum as well as wheezing and fever and chills over the last couple of days.     She has been on home O2 at 4 L/min after having COVID-19 infection last year.  Initially was placed on BiPAP in the ER due to significant respiratory distress.   Patient was requiring 8 L oxygen after arriving the hospital.  She completed Tamiflu.  She was also started on antibiotics for the last 3 days for possible bacterial pneumonia.  She is improving very slowly.

## 2022-09-11 NOTE — Progress Notes (Signed)
Occupational Therapy Treatment Patient Details Name: Elizabeth Rowland MRN: [id number] DOB: June 30, 1941 Today's Date: 09/11/2022   History of present illness Pt is an 81 yo F who comes [facility] on 12/18 c acute onset SOB.  MD assessment includes acute on chronic respiratory failure with hypoxia, influenza, pneumonia, acute on chronic thrombocytopenia, and hypokalemia. PMH includes: PE on Eliquis, lymphedema, type 2 diabetes mellitus.   OT comments  Upon entering session, pt semi-reclined in bed with son present. Both agreeable to OT. Pt required Mod A +2 to come to sitting EOB, Mod A to stand from EOB, and Min guard +2 to transfer from EOB>recliner using RW. She required Max A for LB dressing and Max A for posterior hygiene after incontinent BM. Pt was on 8L HFNC throughout session. SpO2 desatting to 88% with activity, improved with time and pursed lip breathing. Pt left sitting in recliner with all needs in reach. Pt is making progress toward goal completion. D/C recommendation remains appropriate. OT will continue to follow acutely.    Recommendations for follow up therapy are one component of a multi-disciplinary discharge planning process, led by the attending physician.  Recommendations may be updated based on patient status, additional functional criteria and insurance authorization.    Follow Up Recommendations  Skilled nursing-short term rehab (<3 hours/day)     Assistance Recommended at Discharge Frequent or constant Supervision/Assistance  Patient can return home with the following  A lot of help with walking and/or transfers;A lot of help with bathing/dressing/bathroom;Assistance with cooking/housework;Help with stairs or ramp for entrance;Assist for transportation   Equipment Recommendations  None recommended by OT    Recommendations for Other Services      Precautions / Restrictions Precautions Precautions: Fall Restrictions Weight Bearing Restrictions: No        Mobility Bed Mobility Overal bed mobility: Needs Assistance Bed Mobility: Supine to Sit     Supine to sit: Mod assist, +2 for physical assistance     General bed mobility comments: +2 assist for BLE and trunk control with cues for sequencing and use of bed rail    Transfers Overall transfer level: Needs assistance Equipment used: Rolling walker (2 wheels) Transfers: Sit to/from Stand, Bed to chair/wheelchair/BSC Sit to Stand: Mod assist, From elevated surface     Step pivot transfers: +2 physical assistance, Min guard     General transfer comment: VC for hand placement and increased trunk flexion     Balance Overall balance assessment: Needs assistance Sitting-balance support: Feet supported, Bilateral upper extremity supported Sitting balance-Leahy Scale: Fair     Standing balance support: Bilateral upper extremity supported, Reliant on assistive device for balance, During functional activity Standing balance-Leahy Scale: Fair                             ADL either performed or assessed with clinical judgement   ADL Overall ADL's : Needs assistance/impaired                     Lower Body Dressing: Maximal assistance;Sitting/lateral leans Lower Body Dressing Details (indicate cue type and reason): socks     Toileting- Clothing Manipulation and Hygiene: Maximal assistance;Sit to/from stand Toileting - Clothing Manipulation Details (indicate cue type and reason): for posterior hygiene after incontinent BM            Extremity/Trunk Assessment Upper Extremity Assessment Upper Extremity Assessment: Generalized weakness   Lower Extremity Assessment Lower Extremity Assessment: Generalized  weakness        Vision Patient Visual Report: No change from baseline     Perception     Praxis      Cognition Arousal/Alertness: Awake/alert Behavior During Therapy: WFL for tasks assessed/performed Overall Cognitive Status: Within Functional  Limits for tasks assessed                                 General Comments: Pt/son discussing pt's fear of falling        Exercises      Shoulder Instructions       General Comments      Pertinent Vitals/ Pain       Pain Assessment Pain Assessment: Faces Faces Pain Scale: Hurts a little bit Pain Location: Bilateral knees Pain Descriptors / Indicators: Sore Pain Intervention(s): Monitored during session, Repositioned  Home Living                                          Prior Functioning/Environment              Frequency  Min 2X/week        Progress Toward Goals  OT Goals(current goals can now be found in the care plan section)  Progress towards OT goals: Progressing toward goals  Acute Rehab OT Goals Patient Stated Goal: return home OT Goal Formulation: With patient Time For Goal Achievement: 09/19/22 Potential to Achieve Goals: Fair  Plan Discharge plan remains appropriate;Frequency remains appropriate    Co-evaluation                 AM-PAC OT "6 Clicks" Daily Activity     Outcome Measure   Help from another person eating meals?: A Little Help from another person taking care of personal grooming?: A Little Help from another person toileting, which includes using toliet, bedpan, or urinal?: A Lot Help from another person bathing (including washing, rinsing, drying)?: A Lot Help from another person to put on and taking off regular upper body clothing?: A Little Help from another person to put on and taking off regular lower body clothing?: A Lot 6 Click Score: 15    End of Session Equipment Utilized During Treatment: Oxygen;Gait belt;Rolling walker (2 wheels)  OT Visit Diagnosis: Muscle weakness (generalized) (M62.81);Unsteadiness on feet (R26.81);Other abnormalities of gait and mobility (R26.89);Pain   Activity Tolerance Patient tolerated treatment well   Patient Left in chair;with call bell/phone within  reach;with family/visitor present   Nurse Communication Mobility status        Time: 5427-0623 OT Time Calculation (min): 23 min  Charges: OT General Charges $OT Visit: 1 Visit OT Treatments $Self Care/Home Management : 23-37 mins  [facility] MS, OTR/L ascom [phone]  09/11/22, 6:40 PM

## 2022-09-11 NOTE — TOC Progression Note (Signed)
Transition of Care [facility]) - Progression Note    Patient Details  Name: Elizabeth Rowland MRN: [id number] Date of Birth: 1940-12-07  [facility]) CM/SW Contact  Chapman Fitch, RN Phone Number: 09/11/2022, 10:07 AM  Clinical Narrative:      Patient remains on 8L HFNC.  Spoke with son [name].  He is coming to visit patient today and is going to discuss SNF vs home, he is to call me back with their decision       Expected Discharge Plan and Services       Living arrangements for the past 2 months: Single Family Home                                       Social Determinants of Health (SDOH) Interventions SDOH Screenings   Food Insecurity: No Food Insecurity (09/03/2022)  Housing: Low Risk  (09/03/2022)  Transportation Needs: No Transportation Needs (09/03/2022)  Utilities: Not At Risk (09/03/2022)  Tobacco Use: Low Risk  (09/03/2022)    Readmission Risk Interventions     No data to display

## 2022-09-12 DIAGNOSIS — J9621 Acute and chronic respiratory failure with hypoxia: Secondary | ICD-10-CM | POA: Diagnosis not present

## 2022-09-12 DIAGNOSIS — E1142 Type 2 diabetes mellitus with diabetic polyneuropathy: Secondary | ICD-10-CM | POA: Diagnosis not present

## 2022-09-12 DIAGNOSIS — J1100 Influenza due to unidentified influenza virus with unspecified type of pneumonia: Secondary | ICD-10-CM | POA: Diagnosis not present

## 2022-09-12 LAB — BASIC METABOLIC PANEL
Anion gap: 11 (ref 5–15)
BUN: 21 mg/dL (ref 8–23)
CO2: 31 mmol/L (ref 22–32)
Calcium: 8.4 mg/dL — ABNORMAL LOW (ref 8.9–10.3)
Chloride: 94 mmol/L — ABNORMAL LOW (ref 98–111)
Creatinine, Ser: 0.94 mg/dL (ref 0.44–1.00)
GFR, Estimated: >60 mL/min (ref >60)
Glucose, Bld: 127 mg/dL — ABNORMAL HIGH (ref 70–99)
Potassium: 3.9 mmol/L (ref 3.5–5.1)
Sodium: 136 mmol/L (ref 135–145)

## 2022-09-12 LAB — GLUCOSE, CAPILLARY
Glucose-Capillary: 139 mg/dL — ABNORMAL HIGH (ref 70–99)
Glucose-Capillary: 148 mg/dL — ABNORMAL HIGH (ref 70–99)
Glucose-Capillary: 215 mg/dL — ABNORMAL HIGH (ref 70–99)
Glucose-Capillary: 260 mg/dL — ABNORMAL HIGH (ref 70–99)

## 2022-09-12 LAB — CBC
HCT: 39.5 % (ref 36.0–46.0)
Hemoglobin: 12.7 g/dL (ref 12.0–15.0)
MCH: 28.8 pg (ref 26.0–34.0)
MCHC: 32.2 g/dL (ref 30.0–36.0)
MCV: 89.6 fL (ref 80.0–100.0)
Platelets: 316 10*3/uL (ref 150–400)
RBC: 4.41 MIL/uL (ref 3.87–5.11)
RDW: 15.2 % (ref 11.5–15.5)
WBC: 16.3 10*3/uL — ABNORMAL HIGH (ref 4.0–10.5)
nRBC: 0 % (ref 0.0–0.2)

## 2022-09-12 LAB — MAGNESIUM: Magnesium: 2.4 mg/dL (ref 1.7–2.4)

## 2022-09-12 NOTE — Progress Notes (Signed)
Mobility Specialist - Progress Note   09/12/22 1200  Mobility  Activity Transferred from bed to chair;Stood at bedside  Level of Assistance Minimal assist, patient does 75% or more  Assistive Device Front wheel walker  Activity Response Tolerated well  Mobility Referral Yes  $Mobility charge 1 Mobility    Pre-mobility: HR 110, SpO2 89% During mobility: HR 108, SpO2 89-90% Post-mobility: SPO2 97%  Pt supine upon entry, utilizing HFNC 6L. Pt completed bed mob minA for trunk support while transferring EOB. Pt STS to RW MinA +2 with pt activity having bowel movement upon standing, total assist for peri care due to the need of BUE support. Pt returned seated EOB due to fatigue, scoot transfer +2 to the recliner, BUE and trunk supported throughout the transfer. Pt left sitting in recliner with alarm set and needs within reach.   Zetta Bills Mobility Specialist 09/12/22 12:54 PM

## 2022-09-12 NOTE — Progress Notes (Signed)
  Progress Note   Patient: Elizabeth Rowland:[id number] DOB: 1940/10/31 DOA: 09/02/2022     9 DOS: the patient was seen and examined on 09/12/2022   Brief hospital course: Elizabeth Rowland is a 81 y.o. female with medical history significant for PE on Eliquis, lymphedema, type 2 diabetes mellitus, who presented to the ER with acute onset of worsening dyspnea with associated cough productive of gray sputum as well as wheezing and fever and chills over the last couple of days.     She has been on home O2 at 4 L/min after having COVID-19 infection last year.  Initially was placed on BiPAP in the ER due to significant respiratory distress.   Patient was requiring 8 L oxygen after arriving the hospital.  She completed Tamiflu.  She was also started on antibiotics for the last 3 days for possible bacterial pneumonia.  She is improving very slowly.  Assessment and Plan:  Acute on chronic respiratory failure with hypoxia (HCC) Influenza and pneumonia Secondary bacterial pneumonia. Patient does not have volume overload, BNP normal, laisx discontinued Patient is very slow in recovering, we are able to wean down oxygen to 7 L. She still has crackles in the base, more on the right side.  But she does not have any dysphagia or choking episodes. For now, I will continue antibiotics, wean down oxygen. Added incentive spirometer and flutter valve.  Scheduled bronchodilator. Increase ambulation. She may be able to discharge to home health in 2 to 3 days.  Hypokalemia Improved   Dyslipidemia Statin.    History of pulmonary embolism On Eliquis   Type 2 diabetes mellitus with peripheral neuropathy (HCC) Continue current regimen.   Essential hypertension - We will place him on as needed IV labetalol.       Subjective:  Patient feels well, denies any short of breath.  Currently on 7 L oxygen.  Physical Exam: Vitals:   09/12/22 0230 09/12/22 0302 09/12/22 0749 09/12/22 0830  BP:   (!) 115/52  (!) 139/56  Pulse:  87 88 93  Resp:  16 20 16   Temp:  98.6 F (37 C)  97.6 F (36.4 C)  TempSrc:  Oral  Oral  SpO2: 97% 93% 92% 97%  Weight:      Height:       General exam: Appears calm and comfortable  Respiratory system: Rhonchi in the base. Respiratory effort normal. Cardiovascular system: S1 & S2 heard, RRR. No JVD, murmurs, rubs, gallops or clicks. No pedal edema. Gastrointestinal system: Abdomen is nondistended, soft and nontender. No organomegaly or masses felt. Normal bowel sounds heard. Central nervous system: Alert and oriented x3. No focal neurological deficits. Extremities: Symmetric 5 x 5 power. Skin: No rashes, lesions or ulcers Psychiatry: Judgement and insight appear normal. Mood & affect appropriate.   Data Reviewed:  Lab results reviewed.  Family Communication: None  Disposition: Status is: Inpatient Remains inpatient appropriate because: Severity of disease, hypoxemia.  Planned Discharge Destination: Home with Home Health    Time spent: 35 minutes  Author: , MD 09/12/2022 10:56 AM  For on call review [email or web address].

## 2022-09-12 NOTE — TOC Progression Note (Signed)
Transition of Care [facility]) - Progression Note    Patient Details  Name: Elizabeth Rowland MRN: [id number] Date of Birth: 1940/11/08  [facility]) CM/SW Contact  Beverly Sessions, RN Phone Number: 09/12/2022, 10:02 AM  Clinical Narrative:     Followed up with Noah Delaine.  He states that he met with patient yesterday and she confirmed she wants to return home with him health.  Patient has been set up with [facility].   Son confirms that patient has a WC at home       Expected Discharge Plan and Bells arrangements for the past 2 months: Single Family Home                                       Social Determinants of Health (SDOH) Interventions SDOH Screenings   Food Insecurity: No Food Insecurity (09/03/2022)  Housing: Low Risk  (09/03/2022)  Transportation Needs: No Transportation Needs (09/03/2022)  Utilities: Not At Risk (09/03/2022)  Tobacco Use: Low Risk  (09/03/2022)    Readmission Risk Interventions     No data to display

## 2022-09-13 DIAGNOSIS — J9621 Acute and chronic respiratory failure with hypoxia: Secondary | ICD-10-CM | POA: Diagnosis not present

## 2022-09-13 DIAGNOSIS — J1100 Influenza due to unidentified influenza virus with unspecified type of pneumonia: Secondary | ICD-10-CM | POA: Diagnosis not present

## 2022-09-13 DIAGNOSIS — E1142 Type 2 diabetes mellitus with diabetic polyneuropathy: Secondary | ICD-10-CM | POA: Diagnosis not present

## 2022-09-13 LAB — GLUCOSE, CAPILLARY
Glucose-Capillary: 129 mg/dL — ABNORMAL HIGH (ref 70–99)
Glucose-Capillary: 188 mg/dL — ABNORMAL HIGH (ref 70–99)

## 2022-09-13 LAB — CBC
HCT: 38.3 % (ref 36.0–46.0)
Hemoglobin: 11.9 g/dL — ABNORMAL LOW (ref 12.0–15.0)
MCH: 28.4 pg (ref 26.0–34.0)
MCHC: 31.1 g/dL (ref 30.0–36.0)
MCV: 91.4 fL (ref 80.0–100.0)
Platelets: 339 10*3/uL (ref 150–400)
RBC: 4.19 MIL/uL (ref 3.87–5.11)
RDW: 15.4 % (ref 11.5–15.5)
WBC: 16.5 10*3/uL — ABNORMAL HIGH (ref 4.0–10.5)
nRBC: 0 % (ref 0.0–0.2)

## 2022-09-13 LAB — BASIC METABOLIC PANEL
Anion gap: 8 (ref 5–15)
BUN: 17 mg/dL (ref 8–23)
CO2: 33 mmol/L — ABNORMAL HIGH (ref 22–32)
Calcium: 8.5 mg/dL — ABNORMAL LOW (ref 8.9–10.3)
Chloride: 97 mmol/L — ABNORMAL LOW (ref 98–111)
Creatinine, Ser: 1 mg/dL (ref 0.44–1.00)
GFR, Estimated: 56 mL/min — ABNORMAL LOW (ref >60)
Glucose, Bld: 112 mg/dL — ABNORMAL HIGH (ref 70–99)
Potassium: 4.2 mmol/L (ref 3.5–5.1)
Sodium: 138 mmol/L (ref 135–145)

## 2022-09-13 LAB — MAGNESIUM: Magnesium: 2.5 mg/dL — ABNORMAL HIGH (ref 1.7–2.4)

## 2022-09-13 MED ORDER — IPRATROPIUM-ALBUTEROL 0.5-2.5 (3) MG/3ML IN SOLN: 3.0000 mL | RESPIRATORY_TRACT | 0 refills | Status: AC | PRN

## 2022-09-13 MED ORDER — IPRATROPIUM-ALBUTEROL 20-100 MCG/ACT IN AERS: 1.0000 | INHALATION_SPRAY | Freq: Four times a day (QID) | RESPIRATORY_TRACT | 0 refills | Status: AC

## 2022-09-13 NOTE — TOC Transition Note (Signed)
Transition of Care [facility]) - CM/SW Discharge Note   Patient Details  Name: STEPAHNIE CAMPO MRN: [id number] Date of Birth: March 01, 1941  Transition of Care [facility]) CM/SW Contact:  Chapman Fitch, RN Phone Number: 09/13/2022, 11:39 AM   Clinical Narrative:      MD aware that patient sats 90 on 4L at rest, and confirms to proceed with discharge at current settings  Patient will DC to: Home with home health through [facility].  Cory with [facility] notified of discharge Anticipated DC date: 09/13/22  Family notified: Son Chief Operating Officer JA:SNKNL  Per MD patient ready for DC to . RN, patient, patient's family notified.  EMS packet printed to floor and bedside RN aware Ambulance transport requested for patient.   Nebulizer referral made to [facility] with [employer] and to be delivered to the home  TOC signing off.  Bevelyn Ngo [facility] [phone]        Patient Goals and CMS Choice      Discharge Placement                         Discharge Plan and Services Additional resources added to the After Visit Summary for                                       Social Determinants of Health (SDOH) Interventions SDOH Screenings   Food Insecurity: No Food Insecurity (09/03/2022)  Housing: Low Risk  (09/03/2022)  Transportation Needs: No Transportation Needs (09/03/2022)  Utilities: Not At Risk (09/03/2022)  Tobacco Use: Low Risk  (09/03/2022)     Readmission Risk Interventions     No data to display

## 2022-09-13 NOTE — Plan of Care (Signed)
IV removed, discharge instructions reviewed, son contacted and patient to be transported via EMS.

## 2022-09-13 NOTE — Discharge Summary (Signed)
Physician Discharge Summary   Patient: Elizabeth Rowland MRN: [id number] DOB: Jul 26, 1941  Admit date:     09/02/2022  Discharge date: 09/13/22  Discharge Physician: Marrion Coy   PCP: Leanna Sato, MD   Recommendations at discharge:   Follow-up with PCP in 1 week.  Discharge Diagnoses: Principal Problem:   Acute on chronic respiratory failure with hypoxia (HCC) Active Problems:   Hypokalemia   Influenza and pneumonia   Essential hypertension   Type 2 diabetes mellitus with peripheral neuropathy (HCC)   History of pulmonary embolism   Dyslipidemia  Resolved Problems:   * No resolved hospital problems. *  Hospital Course: Elizabeth Rowland is a 81 y.o. female with medical history significant for PE on Eliquis, lymphedema, type 2 diabetes mellitus, who presented to the ER with acute onset of worsening dyspnea with associated cough productive of gray sputum as well as wheezing and fever and chills over the last couple of days.     She has been on home O2 at 4 L/min after having COVID-19 infection last year.  Initially was placed on BiPAP in the ER due to significant respiratory distress.   Patient was requiring 8 L oxygen after arriving the hospital.  She completed Tamiflu.  She was also started on antibiotics for the last 3 days for possible bacterial pneumonia.  She is improving very slowly.  Assessment and Plan: Acute on chronic respiratory failure with hypoxia (HCC) Influenza and pneumonia Secondary bacterial pneumonia. Patient does not have volume overload, BNP normal, diuretics discontinued Patient is very slow in recovering, she was  treated for 4 days with antibiotics for possible bacterial pneumonia, she was also started on bronchodilator. Her condition finally improved, currently she is on 4 L oxygen, which is her baseline.  She is medically stable for discharge.  However, due to chronic respite failure, and old age, long-term prognosis is poor.    Hypokalemia Improved   Dyslipidemia Statin.    History of pulmonary embolism On Eliquis   Type 2 diabetes mellitus with peripheral neuropathy (HCC) Resume home regimen.   Essential hypertension Resume home treatment.       Consultants: None Procedures performed: None  Disposition: Home health Diet recommendation:  Discharge Diet Orders (From admission, onward)     Start     Ordered   09/13/22 0000  Diet - low sodium heart healthy        09/13/22 0956           Cardiac diet DISCHARGE MEDICATION: Allergies as of 09/13/2022   No Known Allergies      Medication List     STOP taking these medications    furosemide 40 MG tablet Commonly known as: Lasix   potassium chloride SA 20 MEQ tablet Commonly known as: KLOR-CON M   spironolactone 25 MG tablet Commonly known as: ALDACTONE       TAKE these medications    apixaban 5 MG Tabs tablet Commonly known as: ELIQUIS Take 1 tablet (5 mg total) by mouth 2 (two) times daily.   fluconazole 200 MG tablet Commonly known as: DIFLUCAN Take 200 mg by mouth once a week.   gabapentin 300 MG capsule Commonly known as: NEURONTIN Take 1 capsule by mouth 3 (three) times daily.   insulin glargine 100 UNIT/ML injection Commonly known as: LANTUS Inject 0.1 mLs (10 Units total) into the skin 2 (two) times daily.   Ipratropium-Albuterol 20-100 MCG/ACT Aers respimat Commonly known as: COMBIVENT Inhale 1 puff into the lungs  every 6 (six) hours.   pravastatin 10 MG tablet Commonly known as: PRAVACHOL Take 10 mg by mouth daily.   traMADol 50 MG tablet Commonly known as: ULTRAM Take 50 mg by mouth every 4 (four) hours as needed.               Discharge Care Instructions  (From admission, onward)           Start     Ordered   09/13/22 0000  Discharge wound care:       Comments: Follow with pcp   09/13/22 [id number]            Follow-up Information     Marguerita Merles, MD Follow up in 1 week(s).    Specialty: Family Medicine Contact information: [place] [phone]                Discharge Exam: Danley Danker Weights   09/03/22 0303  Weight: 105 kg   General exam: Appears calm and comfortable  Respiratory system: Decreased breathing sounds. Respiratory effort normal. Cardiovascular system: S1 & S2 heard, RRR. No JVD, murmurs, rubs, gallops or clicks. No pedal edema. Gastrointestinal system: Abdomen is nondistended, soft and nontender. No organomegaly or masses felt. Normal bowel sounds heard. Central nervous system: Alert and oriented. No focal neurological deficits. Extremities: Symmetric 5 x 5 power. Skin: No rashes, lesions or ulcers Psychiatry: Judgement and insight appear normal. Mood & affect appropriate.    Condition at discharge: fair  The results of significant diagnostics from this hospitalization (including imaging, microbiology, ancillary and laboratory) are listed below for reference.   Imaging Studies: DG Chest 2 View  Result Date: 09/02/2022 CLINICAL DATA:  Shortness of breath EXAM: CHEST - 2 VIEW COMPARISON:  01/28/2021 FINDINGS: Cardiomegaly. Diffuse bilateral interstitial pulmonary opacity. Osseous structures unremarkable. IMPRESSION: Cardiomegaly with diffuse bilateral interstitial pulmonary opacity, consistent with edema or atypical/viral infection. No focal airspace opacity. Electronically Signed   By: Delanna Ahmadi M.D.   On: 09/02/2022 13:25    Microbiology: Results for orders placed or performed during the hospital encounter of 09/02/22  Resp panel by RT-PCR (RSV, Flu A&B, Covid) Anterior Nasal Swab     Status: Abnormal   Collection Time: 09/02/22 12:54 PM   Specimen: Anterior Nasal Swab  Result Value Ref Range Status   SARS Coronavirus 2 by RT PCR NEGATIVE NEGATIVE Final    Comment: (NOTE) SARS-CoV-2 target nucleic acids are NOT DETECTED.  The SARS-CoV-2 RNA is generally detectable in upper  respiratory specimens during the acute phase of infection. The lowest concentration of SARS-CoV-2 viral copies this assay can detect is 138 copies/mL. A negative result does not preclude SARS-Cov-2 infection and should not be used as the sole basis for treatment or other patient management decisions. A negative result may occur with  improper specimen collection/handling, submission of specimen other than nasopharyngeal swab, presence of viral mutation(s) within the areas targeted by this assay, and inadequate number of viral copies(<138 copies/mL). A negative result must be combined with clinical observations, patient history, and epidemiological information. The expected result is Negative.  Fact Sheet for Patients:  EntrepreneurPulse.com.au  Fact Sheet for Healthcare Providers:  IncredibleEmployment.be  This test is no t yet approved or cleared by the [place] FDA and  has been authorized for detection and/or diagnosis of SARS-CoV-2 by FDA under an Emergency Use Authorization (EUA). This EUA will remain  in effect (meaning this test can be used) for the duration of the  COVID-19 declaration under Section 564(b)(1) of the Act, 21 U.S.C.section 360bbb-3(b)(1), unless the authorization is terminated  or revoked sooner.       Influenza A by PCR POSITIVE (A) NEGATIVE Final   Influenza B by PCR NEGATIVE NEGATIVE Final    Comment: (NOTE) The Xpert Xpress SARS-CoV-2/FLU/RSV plus assay is intended as an aid in the diagnosis of influenza from Nasopharyngeal swab specimens and should not be used as a sole basis for treatment. Nasal washings and aspirates are unacceptable for Xpert Xpress SARS-CoV-2/FLU/RSV testing.  Fact Sheet for Patients: EntrepreneurPulse.com.au  Fact Sheet for Healthcare Providers: IncredibleEmployment.be  This test is not yet approved or cleared by the [place] FDA and has been  authorized for detection and/or diagnosis of SARS-CoV-2 by FDA under an Emergency Use Authorization (EUA). This EUA will remain in effect (meaning this test can be used) for the duration of the COVID-19 declaration under Section 564(b)(1) of the Act, 21 U.S.C. section 360bbb-3(b)(1), unless the authorization is terminated or revoked.     Resp Syncytial Virus by PCR NEGATIVE NEGATIVE Final    Comment: (NOTE) Fact Sheet for Patients: EntrepreneurPulse.com.au  Fact Sheet for Healthcare Providers: IncredibleEmployment.be  This test is not yet approved or cleared by the [place] FDA and has been authorized for detection and/or diagnosis of SARS-CoV-2 by FDA under an Emergency Use Authorization (EUA). This EUA will remain in effect (meaning this test can be used) for the duration of the COVID-19 declaration under Section 564(b)(1) of the Act, 21 U.S.C. section 360bbb-3(b)(1), unless the authorization is terminated or revoked.  Performed at [facility], [address] [place], [place] [address]     Labs: CBC: Recent Labs  Lab 09/09/22 [phone] 09/10/22 0324 09/11/22 0420 09/12/22 0602 09/13/22 0504  WBC 19.5* 18.1* 18.3* 16.3* 16.5*  HGB 13.0 12.9 13.2 12.7 11.9*  HCT 41.4 39.9 41.9 39.5 38.3  MCV 90.6 90.1 91.5 89.6 91.4  PLT 234 266 288 316 [id number]   Basic Metabolic Panel: Recent Labs  Lab 09/09/22 0511 09/10/22 0324 09/11/22 0420 09/12/22 0602 09/13/22 0504  NA 139 136 138 136 138  K 3.4* 3.2* 3.7 3.9 4.2  CL 95* 92* 95* 94* 97*  CO2 35* 34* 35* 31 33*  GLUCOSE 143* 120* 124* 127* 112*  BUN 22 22 21 21 17   CREATININE 0.88 0.83 0.88 0.94 1.00  CALCIUM 8.2* 8.3* 8.4* 8.4* 8.5*  MG 2.4 2.3 2.3 2.4 2.5*   Liver Function Tests: No results for input(s): "AST", "ALT", "ALKPHOS", "BILITOT", "PROT", "ALBUMIN" in the last 168 hours. CBG: Recent Labs  Lab 09/12/22 0734 09/12/22 1143 09/12/22 1645 09/12/22 2126  09/13/22 0737  GLUCAP 139* 148* 215* 260* 129*    Discharge time spent: greater than 30 minutes.  Signed: Sharen Hones, MD [facility] 09/13/2022

## 2022-09-13 NOTE — Care Management Important Message (Signed)
Important Message  Patient Details  Name: Elizabeth Rowland MRN: [id number] Date of Birth: Jan 21, 1941   Medicare Important Message Given:  Yes  Reviewed Medicare IM with patient via room phone [phone] due to isolation status.  Copy of Medicare IM placed in mail to home address on file.   [name] 09/13/2022, 10:43 AM

## 2022-09-17 ENCOUNTER — Ambulatory Visit: Payer: Medicare Other | Admitting: Occupational Therapy

## 2022-09-18 ENCOUNTER — Encounter: Payer: Medicare Other | Admitting: Occupational Therapy

## 2022-09-19 ENCOUNTER — Ambulatory Visit: Payer: Medicare Other | Admitting: Occupational Therapy

## 2022-09-23 ENCOUNTER — Encounter: Payer: Medicare Other | Admitting: Occupational Therapy

## 2022-09-24 ENCOUNTER — Encounter: Payer: Medicare Other | Admitting: Occupational Therapy

## 2022-09-25 ENCOUNTER — Encounter: Payer: Medicare Other | Admitting: Occupational Therapy

## 2022-09-26 ENCOUNTER — Encounter: Payer: Medicare Other | Admitting: Occupational Therapy

## 2022-09-30 ENCOUNTER — Encounter: Payer: Medicare Other | Admitting: Occupational Therapy

## 2022-10-01 ENCOUNTER — Ambulatory Visit (INDEPENDENT_AMBULATORY_CARE_PROVIDER_SITE_OTHER): Payer: 59 | Admitting: Vascular Surgery

## 2022-10-01 ENCOUNTER — Encounter: Payer: Medicare Other | Admitting: Occupational Therapy

## 2022-10-03 ENCOUNTER — Encounter: Payer: Medicare Other | Admitting: Occupational Therapy

## 2022-10-07 ENCOUNTER — Encounter: Payer: Medicare Other | Admitting: Occupational Therapy

## 2022-10-08 ENCOUNTER — Encounter: Payer: Medicare Other | Admitting: Occupational Therapy

## 2022-10-10 ENCOUNTER — Encounter: Payer: Medicare Other | Admitting: Occupational Therapy

## 2022-10-14 ENCOUNTER — Encounter: Payer: Medicare Other | Admitting: Occupational Therapy

## 2022-10-15 ENCOUNTER — Encounter: Payer: Medicare Other | Admitting: Occupational Therapy

## 2022-10-17 ENCOUNTER — Encounter: Payer: Medicare Other | Admitting: Occupational Therapy

## 2022-10-21 ENCOUNTER — Encounter: Payer: Medicare Other | Admitting: Occupational Therapy

## 2022-10-22 ENCOUNTER — Encounter: Payer: Medicare Other | Admitting: Occupational Therapy

## 2022-10-24 ENCOUNTER — Encounter: Payer: Medicare Other | Admitting: Occupational Therapy

## 2022-10-29 ENCOUNTER — Encounter: Payer: Medicare Other | Admitting: Occupational Therapy

## 2022-11-05 ENCOUNTER — Encounter: Payer: Medicare Other | Admitting: Occupational Therapy

## 2022-11-12 ENCOUNTER — Encounter: Payer: Medicare Other | Admitting: Occupational Therapy

## 2022-11-19 ENCOUNTER — Encounter: Payer: Medicare Other | Admitting: Occupational Therapy

## 2022-11-25 ENCOUNTER — Encounter: Payer: Medicare Other | Admitting: Occupational Therapy

## 2022-11-28 ENCOUNTER — Encounter: Payer: Medicare Other | Admitting: Occupational Therapy

## 2022-12-03 ENCOUNTER — Encounter: Payer: Medicare Other | Admitting: Occupational Therapy

## 2022-12-05 ENCOUNTER — Encounter: Payer: Medicare Other | Admitting: Occupational Therapy

## 2022-12-06 ENCOUNTER — Encounter (INDEPENDENT_AMBULATORY_CARE_PROVIDER_SITE_OTHER): Payer: Self-pay | Admitting: Vascular Surgery

## 2022-12-06 ENCOUNTER — Encounter (INDEPENDENT_AMBULATORY_CARE_PROVIDER_SITE_OTHER): Payer: 59

## 2022-12-06 ENCOUNTER — Ambulatory Visit (INDEPENDENT_AMBULATORY_CARE_PROVIDER_SITE_OTHER): Payer: 59 | Admitting: Vascular Surgery

## 2022-12-06 VITALS — BP 167/86 | HR 89 | Resp 20 | Ht 66.0 in | Wt 215.0 lb

## 2022-12-06 DIAGNOSIS — E1169 Type 2 diabetes mellitus with other specified complication: Secondary | ICD-10-CM | POA: Diagnosis not present

## 2022-12-06 DIAGNOSIS — I890 Lymphedema, not elsewhere classified: Secondary | ICD-10-CM | POA: Diagnosis not present

## 2022-12-06 DIAGNOSIS — E782 Mixed hyperlipidemia: Secondary | ICD-10-CM

## 2022-12-06 DIAGNOSIS — E785 Hyperlipidemia, unspecified: Secondary | ICD-10-CM

## 2022-12-06 DIAGNOSIS — M7989 Other specified soft tissue disorders: Secondary | ICD-10-CM

## 2022-12-06 DIAGNOSIS — I10 Essential (primary) hypertension: Secondary | ICD-10-CM

## 2022-12-06 DIAGNOSIS — I2694 Multiple subsegmental pulmonary emboli without acute cor pulmonale: Secondary | ICD-10-CM

## 2022-12-06 NOTE — Progress Notes (Signed)
MRN : [id number]  Elizabeth Rowland is a 82 y.o. (Jul 31, 1941) female who presents with chief complaint of  Chief Complaint  Patient presents with   Follow-up    6 week follow up no studies  .  History of Present Illness: Patient returns today in follow up of her leg swelling. ***  Current Outpatient Medications  Medication Sig Dispense Refill   apixaban (ELIQUIS) 5 MG TABS tablet Take 1 tablet (5 mg total) by mouth 2 (two) times daily. 60 tablet 3   gabapentin (NEURONTIN) 300 MG capsule Take 1 capsule by mouth 3 (three) times daily.     hydrOXYzine (ATARAX) 25 MG tablet Take 25 mg by mouth 4 (four) times daily.     insulin glargine (LANTUS) 100 UNIT/ML injection Inject 0.1 mLs (10 Units total) into the skin 2 (two) times daily. 10 mL 11   Ipratropium-Albuterol (COMBIVENT) 20-100 MCG/ACT AERS respimat Inhale 1 puff into the lungs every 6 (six) hours. 4 g 0   ipratropium-albuterol (DUONEB) 0.5-2.5 (3) MG/3ML SOLN Take 3 mLs by nebulization every 4 (four) hours as needed. 360 mL 0   pravastatin (PRAVACHOL) 10 MG tablet Take 10 mg by mouth daily.     spironolactone (ALDACTONE) 25 MG tablet Take by mouth.     torsemide (DEMADEX) 20 MG tablet Take by mouth.     traMADol (ULTRAM) 50 MG tablet Take 50 mg by mouth every 4 (four) hours as needed.     TRUEPLUS 5-BEVEL PEN NEEDLES 32G X 4 MM MISC      fluconazole (DIFLUCAN) 200 MG tablet Take 200 mg by mouth once a week. (Patient not taking: Reported on 12/06/2022)     No current facility-administered medications for this visit.    Past Medical History:  Diagnosis Date   Diabetes mellitus without complication ([place])     Past Surgical History:  Procedure Laterality Date   NO PAST SURGERIES       Social History   Tobacco Use   Smoking status: Never   Smokeless tobacco: Never  Substance Use Topics   Alcohol use: Never   Drug use: Never      Family History  Problem Relation Age of Onset   Stroke Mother      No Known  Allergies  REVIEW OF SYSTEMS (Negative unless checked)   Constitutional: [] Weight loss  [] Fever  [] Chills Cardiac: [] Chest pain   [] Chest pressure   [] Palpitations   [] Shortness of breath when laying flat   [] Shortness of breath at rest   [] Shortness of breath with exertion. 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   [] Asthma Neurologic:  [] Dizziness  [] Blackouts   [] Seizures   [] History of stroke   [] History of TIA  [] Aphasia   [] Temporary blindness   [] Dysphagia   [] Weakness or numbness in arms   [] Weakness or numbness in legs Musculoskeletal:  [x] Arthritis   [] Joint swelling   [x] Joint pain   [] Low back pain Hematologic:  [] Easy bruising  [] Easy bleeding   [] Hypercoagulable state   [] Anemic  [] Hepatitis  Gastrointestinal:  [] Blood in stool   [] Vomiting blood  [] Gastroesophageal reflux/heartburn   [] Abdominal pain Genitourinary:  [] Chronic kidney disease   [] Difficult urination  [] Frequent urination  [] Burning with urination   [] Hematuria Skin:  [] Rashes   [] Ulcers   [] Wounds Psychological:  [] History of anxiety   []  History of major depression.  Physical Examination  BP (!) 167/86 (BP Location: Right Arm)   Pulse 89   Resp 20   Ht 5\' 6"  (1.676 m)   Wt 215 lb (97.5 kg)   LMP  (LMP Unknown)   BMI 34.70 kg/m  Gen:  WD/WN, NAD Head: [place]/AT, No temporalis wasting. Ear/Nose/Throat: Hearing grossly intact, nares w/o erythema or drainage Eyes: Conjunctiva clear. Sclera non-icteric Neck: Supple.  Trachea midline Pulmonary:  Good air movement, no use of accessory muscles.  Cardiac: RRR, no JVD Vascular:  Vessel Right Left  Radial Palpable Palpable                           PT Not Palpable Not Palpable  DP Not Palpable Not Palpable   Gastrointestinal: soft, non-tender/non-distended. No guarding/reflex.  Musculoskeletal: M/S 5/5 throughout.  No deformity or atrophy.  Severe hyperpigmentation with marked stasis dermatitis changes and blistering of the skin.  3+ bilateral lower extremity edema. Neurologic: Sensation grossly intact in extremities.  Symmetrical.  Speech is fluent.  Psychiatric: Judgment intact, Mood & affect appropriate for pt's clinical situation. Dermatologic: No rashes or ulcers noted.  No cellulitis or open wounds.      Labs Recent Results (from the past 2160 hour(s))  Glucose, capillary     Status: Abnormal   Collection Time: 09/07/22 11:08 AM  Result Value Ref Range   Glucose-Capillary 193 (H) 70 - 99 mg/dL    Comment: Glucose reference range applies only to samples taken after fasting for at least 8 hours.  Glucose, capillary     Status: Abnormal   Collection Time: 09/07/22  4:37 PM  Result Value Ref Range   Glucose-Capillary 194 (H) 70 - 99 mg/dL    Comment: Glucose reference range applies only to samples taken after fasting for at least 8 hours.  Glucose, capillary     Status: Abnormal   Collection Time: 09/07/22 10:15 PM  Result Value Ref Range   Glucose-Capillary 204 (H) 70 - 99 mg/dL    Comment: Glucose reference range applies only to samples taken after fasting for at least 8 hours.  CBC     Status: Abnormal   Collection Time: 09/08/22  6:17 AM  Result Value Ref Range   WBC 18.5 (H) 4.0 - 10.5 K/uL   RBC 4.57 3.87 - 5.11 MIL/uL   Hemoglobin 13.1 12.0 - 15.0 g/dL   HCT 41.3 36.0 - 46.0 %   MCV 90.4 80.0 - 100.0 fL   MCH 28.7 26.0 - 34.0 pg   MCHC 31.7 30.0 - 36.0 g/dL   RDW 15.4 11.5 - 15.5 %   Platelets 176 150 - 400 K/uL   nRBC 0.1 0.0 - 0.2 %    Comment: Performed at [facility], [address] [place], [place] [id number]  Basic metabolic panel     Status: Abnormal    Collection Time: 09/08/22  6:17 AM  Result Value Ref Range   Sodium 135 135 - 145 mmol/L   Potassium 3.4 (L) 3.5 - 5.1 mmol/L   Chloride 95 (L) 98 - 111 mmol/L   CO2 31 22 - 32 mmol/L  Glucose, Bld 163 (H) 70 - 99 mg/dL    Comment: Glucose reference range applies only to samples taken after fasting for at least 8 hours.   BUN 19 8 - 23 mg/dL   Creatinine, Ser 0.83 0.44 - 1.00 mg/dL   Calcium 8.2 (L) 8.9 - 10.3 mg/dL   GFR, Estimated >60 >60 mL/min    Comment: (NOTE) Calculated using the CKD-EPI Creatinine Equation (2021)    Anion gap 9 5 - 15    Comment: Performed at [facility], [address] [place], [place] [address]  Magnesium     Status: None   Collection Time: 09/08/22  6:17 AM  Result Value Ref Range   Magnesium 2.3 1.7 - 2.4 mg/dL    Comment: Performed at [facility], [address] [place], [place] [address]  Glucose, capillary     Status: Abnormal   Collection Time: 09/08/22  9:00 AM  Result Value Ref Range   Glucose-Capillary 163 (H) 70 - 99 mg/dL    Comment: Glucose reference range applies only to samples taken after fasting for at least 8 hours.  Glucose, capillary     Status: Abnormal   Collection Time: 09/08/22 12:12 PM  Result Value Ref Range   Glucose-Capillary 185 (H) 70 - 99 mg/dL    Comment: Glucose reference range applies only to samples taken after fasting for at least 8 hours.  Glucose, capillary     Status: Abnormal   Collection Time: 09/08/22  3:48 PM  Result Value Ref Range   Glucose-Capillary 181 (H) 70 - 99 mg/dL    Comment: Glucose reference range applies only to samples taken after fasting for at least 8 hours.  Glucose, capillary     Status: Abnormal   Collection Time: 09/08/22  9:04 PM  Result Value Ref Range   Glucose-Capillary 262 (H) 70 - 99 mg/dL    Comment: Glucose reference range applies only to samples taken after fasting for at least 8 hours.  Basic metabolic panel     Status: Abnormal   Collection Time:  09/09/22  5:11 AM  Result Value Ref Range   Sodium 139 135 - 145 mmol/L   Potassium 3.4 (L) 3.5 - 5.1 mmol/L   Chloride 95 (L) 98 - 111 mmol/L   CO2 35 (H) 22 - 32 mmol/L   Glucose, Bld 143 (H) 70 - 99 mg/dL    Comment: Glucose reference range applies only to samples taken after fasting for at least 8 hours.   BUN 22 8 - 23 mg/dL   Creatinine, Ser 0.88 0.44 - 1.00 mg/dL   Calcium 8.2 (L) 8.9 - 10.3 mg/dL   GFR, Estimated >60 >60 mL/min    Comment: (NOTE) Calculated using the CKD-EPI Creatinine Equation (2021)    Anion gap 9 5 - 15    Comment: Performed at [facility], [address] [place], [place] [address]  CBC     Status: Abnormal   Collection Time: 09/09/22  5:11 AM  Result Value Ref Range   WBC 19.5 (H) 4.0 - 10.5 K/uL   RBC 4.57 3.87 - 5.11 MIL/uL   Hemoglobin 13.0 12.0 - 15.0 g/dL   HCT 41.4 36.0 - 46.0 %   MCV 90.6 80.0 - 100.0 fL   MCH 28.4 26.0 - 34.0 pg   MCHC 31.4 30.0 - 36.0 g/dL   RDW 15.4 11.5 - 15.5 %   Platelets 234 150 - 400 K/uL   nRBC 0.2 0.0 - 0.2 %  Comment: Performed at [facility], [address] [place], [place] [address]  Magnesium     Status: None   Collection Time: 09/09/22  5:11 AM  Result Value Ref Range   Magnesium 2.4 1.7 - 2.4 mg/dL    Comment: Performed at [facility], [address] [place], [place] [address]  Glucose, capillary     Status: Abnormal   Collection Time: 09/09/22  7:49 AM  Result Value Ref Range   Glucose-Capillary 160 (H) 70 - 99 mg/dL    Comment: Glucose reference range applies only to samples taken after fasting for at least 8 hours.  Glucose, capillary     Status: Abnormal   Collection Time: 09/09/22 11:14 AM  Result Value Ref Range   Glucose-Capillary 219 (H) 70 - 99 mg/dL    Comment: Glucose reference range applies only to samples taken after fasting for at least 8 hours.  Glucose, capillary     Status: Abnormal   Collection Time: 09/09/22  5:01 PM  Result Value Ref Range    Glucose-Capillary 194 (H) 70 - 99 mg/dL    Comment: Glucose reference range applies only to samples taken after fasting for at least 8 hours.  Glucose, capillary     Status: Abnormal   Collection Time: 09/09/22  9:27 PM  Result Value Ref Range   Glucose-Capillary 232 (H) 70 - 99 mg/dL    Comment: Glucose reference range applies only to samples taken after fasting for at least 8 hours.  Procalcitonin - Baseline     Status: None   Collection Time: 09/09/22 11:08 PM  Result Value Ref Range   Procalcitonin 0.13 ng/mL    Comment:        Interpretation: PCT (Procalcitonin) <= 0.5 ng/mL: Systemic infection (sepsis) is not likely. Local bacterial infection is possible. (NOTE)       Sepsis PCT Algorithm           Lower Respiratory Tract                                      Infection PCT Algorithm    ----------------------------     ----------------------------         PCT < 0.25 ng/mL                PCT < 0.10 ng/mL          Strongly encourage             Strongly discourage   discontinuation of antibiotics    initiation of antibiotics    ----------------------------     -----------------------------       PCT 0.25 - 0.50 ng/mL            PCT 0.10 - 0.25 ng/mL               OR       >80% decrease in PCT            Discourage initiation of                                            antibiotics      Encourage discontinuation           of antibiotics    ----------------------------     -----------------------------  PCT >= 0.50 ng/mL              PCT 0.26 - 0.50 ng/mL               AND        <80% decrease in PCT             Encourage initiation of                                             antibiotics       Encourage continuation           of antibiotics    ----------------------------     -----------------------------        PCT >= 0.50 ng/mL                  PCT > 0.50 ng/mL               AND         increase in PCT                  Strongly encourage                                       initiation of antibiotics    Strongly encourage escalation           of antibiotics                                     -----------------------------                                           PCT <= 0.25 ng/mL                                                 OR                                        > 80% decrease in PCT                                      Discontinue / Do not initiate                                             antibiotics  Performed at [facility], [address] [place], [place] [id number]   Basic metabolic panel     Status: Abnormal   Collection Time: 09/10/22  3:24 AM  Result Value Ref Range   Sodium 136 135 - 145 mmol/L   Potassium 3.2 (L) 3.5 - 5.1 mmol/L   Chloride 92 (L) 98 - 111 mmol/L  CO2 34 (H) 22 - 32 mmol/L   Glucose, Bld 120 (H) 70 - 99 mg/dL    Comment: Glucose reference range applies only to samples taken after fasting for at least 8 hours.   BUN 22 8 - 23 mg/dL   Creatinine, Ser 0.83 0.44 - 1.00 mg/dL   Calcium 8.3 (L) 8.9 - 10.3 mg/dL   GFR, Estimated >60 >60 mL/min    Comment: (NOTE) Calculated using the CKD-EPI Creatinine Equation (2021)    Anion gap 10 5 - 15    Comment: Performed at [facility], [address] [place], [place] [address]  CBC     Status: Abnormal   Collection Time: 09/10/22  3:24 AM  Result Value Ref Range   WBC 18.1 (H) 4.0 - 10.5 K/uL   RBC 4.43 3.87 - 5.11 MIL/uL   Hemoglobin 12.9 12.0 - 15.0 g/dL   HCT 39.9 36.0 - 46.0 %   MCV 90.1 80.0 - 100.0 fL   MCH 29.1 26.0 - 34.0 pg   MCHC 32.3 30.0 - 36.0 g/dL   RDW 15.2 11.5 - 15.5 %   Platelets 266 150 - 400 K/uL   nRBC 0.2 0.0 - 0.2 %    Comment: Performed at [facility], [address] [place], [place] [address]  Magnesium     Status: None   Collection Time: 09/10/22  3:24 AM  Result Value Ref Range   Magnesium 2.3 1.7 - 2.4 mg/dL    Comment: Performed at [facility], [address] [place], [place]  [address]  Glucose, capillary     Status: Abnormal   Collection Time: 09/10/22  7:39 AM  Result Value Ref Range   Glucose-Capillary 150 (H) 70 - 99 mg/dL    Comment: Glucose reference range applies only to samples taken after fasting for at least 8 hours.  Glucose, capillary     Status: Abnormal   Collection Time: 09/10/22 11:29 AM  Result Value Ref Range   Glucose-Capillary 279 (H) 70 - 99 mg/dL    Comment: Glucose reference range applies only to samples taken after fasting for at least 8 hours.  Glucose, capillary     Status: Abnormal   Collection Time: 09/10/22  4:33 PM  Result Value Ref Range   Glucose-Capillary 180 (H) 70 - 99 mg/dL    Comment: Glucose reference range applies only to samples taken after fasting for at least 8 hours.  Glucose, capillary     Status: Abnormal   Collection Time: 09/10/22  9:23 PM  Result Value Ref Range   Glucose-Capillary 203 (H) 70 - 99 mg/dL    Comment: Glucose reference range applies only to samples taken after fasting for at least 8 hours.  Basic metabolic panel     Status: Abnormal   Collection Time: 09/11/22  4:20 AM  Result Value Ref Range   Sodium 138 135 - 145 mmol/L   Potassium 3.7 3.5 - 5.1 mmol/L   Chloride 95 (L) 98 - 111 mmol/L   CO2 35 (H) 22 - 32 mmol/L   Glucose, Bld 124 (H) 70 - 99 mg/dL    Comment: Glucose reference range applies only to samples taken after fasting for at least 8 hours.   BUN 21 8 - 23 mg/dL   Creatinine, Ser 0.88 0.44 - 1.00 mg/dL   Calcium 8.4 (L) 8.9 - 10.3 mg/dL   GFR, Estimated >60 >60 mL/min    Comment: (NOTE) Calculated using the CKD-EPI Creatinine Equation (2021)  Anion gap 8 5 - 15    Comment: Performed at [facility], [address] [place], [place] [address]  CBC     Status: Abnormal   Collection Time: 09/11/22  4:20 AM  Result Value Ref Range   WBC 18.3 (H) 4.0 - 10.5 K/uL   RBC 4.58 3.87 - 5.11 MIL/uL   Hemoglobin 13.2 12.0 - 15.0 g/dL   HCT 41.9 36.0 - 46.0 %   MCV 91.5 80.0 -  100.0 fL   MCH 28.8 26.0 - 34.0 pg   MCHC 31.5 30.0 - 36.0 g/dL   RDW 15.2 11.5 - 15.5 %   Platelets 288 150 - 400 K/uL   nRBC 0.0 0.0 - 0.2 %    Comment: Performed at [facility], [address] [place], [place] [address]  Magnesium     Status: None   Collection Time: 09/11/22  4:20 AM  Result Value Ref Range   Magnesium 2.3 1.7 - 2.4 mg/dL    Comment: Performed at [facility], [address] [place], [place] [address]  Brain natriuretic peptide     Status: None   Collection Time: 09/11/22  4:20 AM  Result Value Ref Range   B Natriuretic Peptide 35.9 0.0 - 100.0 pg/mL    Comment: Performed at [facility], [address] [place], [place] [address]  Glucose, capillary     Status: Abnormal   Collection Time: 09/11/22  7:49 AM  Result Value Ref Range   Glucose-Capillary 203 (H) 70 - 99 mg/dL    Comment: Glucose reference range applies only to samples taken after fasting for at least 8 hours.  Glucose, capillary     Status: Abnormal   Collection Time: 09/11/22 11:40 AM  Result Value Ref Range   Glucose-Capillary 197 (H) 70 - 99 mg/dL    Comment: Glucose reference range applies only to samples taken after fasting for at least 8 hours.  Glucose, capillary     Status: Abnormal   Collection Time: 09/11/22  4:45 PM  Result Value Ref Range   Glucose-Capillary 221 (H) 70 - 99 mg/dL    Comment: Glucose reference range applies only to samples taken after fasting for at least 8 hours.  Glucose, capillary     Status: Abnormal   Collection Time: 09/11/22  9:05 PM  Result Value Ref Range   Glucose-Capillary 207 (H) 70 - 99 mg/dL    Comment: Glucose reference range applies only to samples taken after fasting for at least 8 hours.  Basic metabolic panel     Status: Abnormal   Collection Time: 09/12/22  6:02 AM  Result Value Ref Range   Sodium 136 135 - 145 mmol/L   Potassium 3.9 3.5 - 5.1 mmol/L   Chloride 94 (L) 98 - 111 mmol/L   CO2 31 22 - 32 mmol/L    Glucose, Bld 127 (H) 70 - 99 mg/dL    Comment: Glucose reference range applies only to samples taken after fasting for at least 8 hours.   BUN 21 8 - 23 mg/dL   Creatinine, Ser 0.94 0.44 - 1.00 mg/dL   Calcium 8.4 (L) 8.9 - 10.3 mg/dL   GFR, Estimated >60 >60 mL/min    Comment: (NOTE) Calculated using the CKD-EPI Creatinine Equation (2021)    Anion gap 11 5 - 15    Comment: Performed at [facility], [address] [place], [place] [address]  CBC     Status: Abnormal  Collection Time: 09/12/22  6:02 AM  Result Value Ref Range   WBC 16.3 (H) 4.0 - 10.5 K/uL   RBC 4.41 3.87 - 5.11 MIL/uL   Hemoglobin 12.7 12.0 - 15.0 g/dL   HCT 39.5 36.0 - 46.0 %   MCV 89.6 80.0 - 100.0 fL   MCH 28.8 26.0 - 34.0 pg   MCHC 32.2 30.0 - 36.0 g/dL   RDW 15.2 11.5 - 15.5 %   Platelets 316 150 - 400 K/uL   nRBC 0.0 0.0 - 0.2 %    Comment: Performed at [facility], [address], [place], [place] [address]  Magnesium     Status: None   Collection Time: 09/12/22  6:02 AM  Result Value Ref Range   Magnesium 2.4 1.7 - 2.4 mg/dL    Comment: Performed at [facility], [address] [place], [place] [address]  Glucose, capillary     Status: Abnormal   Collection Time: 09/12/22  7:34 AM  Result Value Ref Range   Glucose-Capillary 139 (H) 70 - 99 mg/dL    Comment: Glucose reference range applies only to samples taken after fasting for at least 8 hours.  Glucose, capillary     Status: Abnormal   Collection Time: 09/12/22 11:43 AM  Result Value Ref Range   Glucose-Capillary 148 (H) 70 - 99 mg/dL    Comment: Glucose reference range applies only to samples taken after fasting for at least 8 hours.  Glucose, capillary     Status: Abnormal   Collection Time: 09/12/22  4:45 PM  Result Value Ref Range   Glucose-Capillary 215 (H) 70 - 99 mg/dL    Comment: Glucose reference range applies only to samples taken after fasting for at least 8 hours.  Glucose, capillary     Status:  Abnormal   Collection Time: 09/12/22  9:26 PM  Result Value Ref Range   Glucose-Capillary 260 (H) 70 - 99 mg/dL    Comment: Glucose reference range applies only to samples taken after fasting for at least 8 hours.  Basic metabolic panel     Status: Abnormal   Collection Time: 09/13/22  5:04 AM  Result Value Ref Range   Sodium 138 135 - 145 mmol/L   Potassium 4.2 3.5 - 5.1 mmol/L   Chloride 97 (L) 98 - 111 mmol/L   CO2 33 (H) 22 - 32 mmol/L   Glucose, Bld 112 (H) 70 - 99 mg/dL    Comment: Glucose reference range applies only to samples taken after fasting for at least 8 hours.   BUN 17 8 - 23 mg/dL   Creatinine, Ser 1.00 0.44 - 1.00 mg/dL   Calcium 8.5 (L) 8.9 - 10.3 mg/dL   GFR, Estimated 56 (L) >60 mL/min    Comment: (NOTE) Calculated using the CKD-EPI Creatinine Equation (2021)    Anion gap 8 5 - 15    Comment: Performed at [facility], [address] [place], [place] [address]  CBC     Status: Abnormal   Collection Time: 09/13/22  5:04 AM  Result Value Ref Range   WBC 16.5 (H) 4.0 - 10.5 K/uL   RBC 4.19 3.87 - 5.11 MIL/uL   Hemoglobin 11.9 (L) 12.0 - 15.0 g/dL   HCT 38.3 36.0 - 46.0 %   MCV 91.4 80.0 - 100.0 fL   MCH 28.4 26.0 - 34.0 pg   MCHC 31.1 30.0 - 36.0 g/dL   RDW 15.4 11.5 - 15.5 %   Platelets  339 150 - 400 K/uL   nRBC 0.0 0.0 - 0.2 %    Comment: Performed at [facility], [address] [place], [place] [address]  Magnesium     Status: Abnormal   Collection Time: 09/13/22  5:04 AM  Result Value Ref Range   Magnesium 2.5 (H) 1.7 - 2.4 mg/dL    Comment: Performed at [facility], [address] [place], [place] [address]  Glucose, capillary     Status: Abnormal   Collection Time: 09/13/22  7:37 AM  Result Value Ref Range   Glucose-Capillary 129 (H) 70 - 99 mg/dL    Comment: Glucose reference range applies only to samples taken after fasting for at least 8 hours.  Glucose, capillary     Status: Abnormal   Collection Time:  09/13/22 11:48 AM  Result Value Ref Range   Glucose-Capillary 188 (H) 70 - 99 mg/dL    Comment: Glucose reference range applies only to samples taken after fasting for at least 8 hours.    Radiology No results found.  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  On oxygen therapy.   Essential hypertension blood pressure control important in reducing the progression of atherosclerotic disease. On appropriate oral medications.     Hyperlipidemia lipid control important in reducing the progression of atherosclerotic disease. Continue statin therapy     Type 2 diabetes mellitus with hyperlipidemia (HCC) blood glucose control important in reducing the progression of atherosclerotic disease. Also, involved in wound healing. On appropriate medications.  No problem-specific Assessment & Plan notes found for this encounter.    Leotis Pain, MD  12/06/2022 12:02 PM    This note was created with Dragon medical transcription system.  Any errors from dictation are purely unintentional

## 2022-12-09 NOTE — Assessment & Plan Note (Signed)
Much worse today.

## 2022-12-09 NOTE — Assessment & Plan Note (Signed)
Swelling and skin changes are markedly worsened from last visit.  She has severe hyperpigmentation now with blistering of the skin.  She has stage III lymphedema and we are going to wrap her in Unna boots today and will change these weekly for 4 to 6 weeks to try to get her skin healed and her swelling under control.  She clearly needs a lymphedema pump.

## 2022-12-10 ENCOUNTER — Encounter: Payer: Medicare Other | Admitting: Occupational Therapy

## 2022-12-12 ENCOUNTER — Encounter (INDEPENDENT_AMBULATORY_CARE_PROVIDER_SITE_OTHER): Payer: Self-pay | Admitting: Nurse Practitioner

## 2022-12-12 ENCOUNTER — Encounter: Payer: Medicare Other | Admitting: Occupational Therapy

## 2022-12-12 ENCOUNTER — Ambulatory Visit (INDEPENDENT_AMBULATORY_CARE_PROVIDER_SITE_OTHER): Payer: 59 | Admitting: Nurse Practitioner

## 2022-12-12 VITALS — BP 180/86 | HR 95 | Resp 18

## 2022-12-12 DIAGNOSIS — I890 Lymphedema, not elsewhere classified: Secondary | ICD-10-CM

## 2022-12-12 NOTE — Progress Notes (Signed)
History of Present Illness  There is no documented history at this time  Assessments & Plan   There are no diagnoses linked to this encounter.    Additional instructions  Subjective:  Patient presents with venous ulcer of the Bilateral lower extremity.    Procedure:  3 layer unna wrap was placed Bilateral lower extremity.   Plan:   Follow up in one week.  

## 2022-12-13 ENCOUNTER — Encounter (INDEPENDENT_AMBULATORY_CARE_PROVIDER_SITE_OTHER): Payer: Self-pay | Admitting: Nurse Practitioner

## 2022-12-17 ENCOUNTER — Encounter: Payer: Medicare Other | Admitting: Occupational Therapy

## 2022-12-19 ENCOUNTER — Encounter: Payer: Medicare Other | Admitting: Occupational Therapy

## 2022-12-20 ENCOUNTER — Ambulatory Visit (INDEPENDENT_AMBULATORY_CARE_PROVIDER_SITE_OTHER): Payer: 59 | Admitting: Nurse Practitioner

## 2022-12-20 ENCOUNTER — Encounter (INDEPENDENT_AMBULATORY_CARE_PROVIDER_SITE_OTHER): Payer: Self-pay

## 2022-12-20 VITALS — BP 172/80 | HR 94 | Resp 18

## 2022-12-20 DIAGNOSIS — I739 Peripheral vascular disease, unspecified: Secondary | ICD-10-CM

## 2022-12-20 DIAGNOSIS — I890 Lymphedema, not elsewhere classified: Secondary | ICD-10-CM

## 2022-12-20 NOTE — Progress Notes (Signed)
History of Present Illness  There is no documented history at this time  Assessments & Plan   There are no diagnoses linked to this encounter.    Additional instructions  Subjective:  Patient presents with venous ulcer of the Bilateral lower extremity.    Procedure:  3 layer unna wrap was placed Bilateral lower extremity.   Plan:   Follow up in one week.  

## 2022-12-24 ENCOUNTER — Encounter: Payer: Medicare Other | Admitting: Occupational Therapy

## 2022-12-26 ENCOUNTER — Encounter: Payer: Medicare Other | Admitting: Occupational Therapy

## 2022-12-27 ENCOUNTER — Encounter (INDEPENDENT_AMBULATORY_CARE_PROVIDER_SITE_OTHER): Payer: Self-pay

## 2022-12-27 ENCOUNTER — Ambulatory Visit (INDEPENDENT_AMBULATORY_CARE_PROVIDER_SITE_OTHER): Payer: 59 | Admitting: Nurse Practitioner

## 2022-12-27 VITALS — BP 153/84 | HR 93 | Resp 18

## 2022-12-27 DIAGNOSIS — I890 Lymphedema, not elsewhere classified: Secondary | ICD-10-CM

## 2022-12-27 NOTE — Progress Notes (Signed)
History of Present Illness  There is no documented history at this time  Assessments & Plan   There are no diagnoses linked to this encounter.    Additional instructions  Subjective:  Patient presents with venous ulcer of the Bilateral lower extremity.    Procedure:  3 layer unna wrap was placed Bilateral lower extremity.   Plan:   Follow up in one week.  

## 2022-12-30 ENCOUNTER — Encounter (INDEPENDENT_AMBULATORY_CARE_PROVIDER_SITE_OTHER): Payer: Self-pay | Admitting: Nurse Practitioner

## 2022-12-31 ENCOUNTER — Encounter: Payer: Medicare Other | Admitting: Occupational Therapy

## 2023-01-01 ENCOUNTER — Telehealth (INDEPENDENT_AMBULATORY_CARE_PROVIDER_SITE_OTHER): Payer: Self-pay | Admitting: Nurse Practitioner

## 2023-01-01 NOTE — Telephone Encounter (Signed)
Call patient about unna wrap patient fell and he has concerns and doesn't know what he should do about her legs canceling her Friday appt    Please call and advise

## 2023-01-01 NOTE — Telephone Encounter (Signed)
Patient son was advise to remove wraps since she will not be seen until next week. I recommended for the patient elevate and wear compression. Patient son will use compression wraps that he has at home.

## 2023-01-02 ENCOUNTER — Encounter: Payer: Medicare Other | Admitting: Occupational Therapy

## 2023-01-03 ENCOUNTER — Encounter (INDEPENDENT_AMBULATORY_CARE_PROVIDER_SITE_OTHER): Payer: 59

## 2023-01-10 ENCOUNTER — Encounter (INDEPENDENT_AMBULATORY_CARE_PROVIDER_SITE_OTHER): Payer: Self-pay | Admitting: Nurse Practitioner

## 2023-01-10 ENCOUNTER — Ambulatory Visit (INDEPENDENT_AMBULATORY_CARE_PROVIDER_SITE_OTHER): Payer: 59 | Admitting: Nurse Practitioner

## 2023-01-10 VITALS — BP 157/74 | HR 109 | Resp 18 | Ht 63.0 in | Wt 215.0 lb

## 2023-01-10 DIAGNOSIS — E782 Mixed hyperlipidemia: Secondary | ICD-10-CM | POA: Diagnosis not present

## 2023-01-10 DIAGNOSIS — I10 Essential (primary) hypertension: Secondary | ICD-10-CM

## 2023-01-10 DIAGNOSIS — I2694 Multiple subsegmental pulmonary emboli without acute cor pulmonale: Secondary | ICD-10-CM

## 2023-01-10 DIAGNOSIS — E785 Hyperlipidemia, unspecified: Secondary | ICD-10-CM

## 2023-01-10 DIAGNOSIS — E1169 Type 2 diabetes mellitus with other specified complication: Secondary | ICD-10-CM | POA: Diagnosis not present

## 2023-01-10 MED ORDER — SULFAMETHOXAZOLE-TRIMETHOPRIM 800-160 MG PO TABS: 1.0000 | ORAL_TABLET | Freq: Two times a day (BID) | ORAL | 0 refills | Status: AC

## 2023-01-12 ENCOUNTER — Encounter (INDEPENDENT_AMBULATORY_CARE_PROVIDER_SITE_OTHER): Payer: Self-pay | Admitting: Nurse Practitioner

## 2023-01-16 ENCOUNTER — Encounter (INDEPENDENT_AMBULATORY_CARE_PROVIDER_SITE_OTHER): Payer: Self-pay

## 2023-01-16 ENCOUNTER — Ambulatory Visit (INDEPENDENT_AMBULATORY_CARE_PROVIDER_SITE_OTHER): Payer: 59 | Admitting: Nurse Practitioner

## 2023-01-16 VITALS — BP 151/72 | HR 80 | Resp 16

## 2023-01-16 DIAGNOSIS — I890 Lymphedema, not elsewhere classified: Secondary | ICD-10-CM | POA: Diagnosis not present

## 2023-01-16 NOTE — Progress Notes (Signed)
History of Present Illness  There is no documented history at this time  Assessments & Plan   There are no diagnoses linked to this encounter.    Additional instructions  Subjective:  Patient presents with venous ulcer of the Bilateral lower extremity.    Procedure:  3 layer unna wrap was placed Bilateral lower extremity.   Plan:   Follow up in one week.  

## 2023-01-23 ENCOUNTER — Encounter (INDEPENDENT_AMBULATORY_CARE_PROVIDER_SITE_OTHER): Payer: Self-pay | Admitting: Nurse Practitioner

## 2023-01-23 ENCOUNTER — Ambulatory Visit (INDEPENDENT_AMBULATORY_CARE_PROVIDER_SITE_OTHER): Payer: 59 | Admitting: Nurse Practitioner

## 2023-01-23 VITALS — BP 153/76 | HR 91 | Resp 19

## 2023-01-23 DIAGNOSIS — I890 Lymphedema, not elsewhere classified: Secondary | ICD-10-CM | POA: Diagnosis not present

## 2023-01-23 NOTE — Progress Notes (Signed)
History of Present Illness  There is no documented history at this time  Assessments & Plan   There are no diagnoses linked to this encounter.    Additional instructions  Subjective:  Patient presents with venous ulcer of the Bilateral lower extremity.    Procedure:  3 layer unna wrap was placed Bilateral lower extremity.   Plan:   Follow up in one week.  

## 2023-01-24 ENCOUNTER — Encounter (INDEPENDENT_AMBULATORY_CARE_PROVIDER_SITE_OTHER): Payer: Self-pay | Admitting: Nurse Practitioner

## 2023-01-30 ENCOUNTER — Ambulatory Visit (INDEPENDENT_AMBULATORY_CARE_PROVIDER_SITE_OTHER): Payer: 59 | Admitting: Nurse Practitioner

## 2023-01-30 ENCOUNTER — Encounter (INDEPENDENT_AMBULATORY_CARE_PROVIDER_SITE_OTHER): Payer: Self-pay

## 2023-01-30 VITALS — BP 148/80 | HR 102 | Resp 16

## 2023-01-30 DIAGNOSIS — I890 Lymphedema, not elsewhere classified: Secondary | ICD-10-CM | POA: Diagnosis not present

## 2023-01-30 NOTE — Progress Notes (Signed)
History of Present Illness  There is no documented history at this time  Assessments & Plan   There are no diagnoses linked to this encounter.    Additional instructions  Subjective:  Patient presents with venous ulcer of the Bilateral lower extremity.    Procedure:  3 layer unna wrap was placed Bilateral lower extremity.   Plan:   Follow up in one week.  

## 2023-02-03 ENCOUNTER — Encounter (INDEPENDENT_AMBULATORY_CARE_PROVIDER_SITE_OTHER): Payer: Self-pay | Admitting: Nurse Practitioner

## 2023-02-03 NOTE — Progress Notes (Signed)
MRN : [id number]  Elizabeth Rowland is a 82 y.o. (March 22, 1941) female who presents with chief complaint of  Chief Complaint  Patient presents with   Follow-up    Follow up unna boot check  .  History of Present Illness: Patient returns today in follow up of her leg swelling.  She has been in [employer] for several weeks.  The patient has shown some improvement in her wounds following having the flu and having swelling and ulcerations.  However the wounds are not completely healed at this time.  She has been tolerating the Unna boots well.  Current Outpatient Medications  Medication Sig Dispense Refill   apixaban (ELIQUIS) 5 MG TABS tablet Take 1 tablet (5 mg total) by mouth 2 (two) times daily. 60 tablet 3   gabapentin (NEURONTIN) 300 MG capsule Take 1 capsule by mouth 3 (three) times daily.     hydrOXYzine (ATARAX) 25 MG tablet Take 25 mg by mouth 4 (four) times daily.     insulin glargine (LANTUS) 100 UNIT/ML injection Inject 0.1 mLs (10 Units total) into the skin 2 (two) times daily. 10 mL 11   Ipratropium-Albuterol (COMBIVENT) 20-100 MCG/ACT AERS respimat Inhale 1 puff into the lungs every 6 (six) hours. 4 g 0   ipratropium-albuterol (DUONEB) 0.5-2.5 (3) MG/3ML SOLN Take 3 mLs by nebulization every 4 (four) hours as needed. 360 mL 0   pravastatin (PRAVACHOL) 10 MG tablet Take 10 mg by mouth daily.     sulfamethoxazole-trimethoprim (BACTRIM DS) 800-160 MG tablet Take 1 tablet by mouth 2 (two) times daily. 20 tablet 0   torsemide (DEMADEX) 20 MG tablet Take by mouth.     traMADol (ULTRAM) 50 MG tablet Take 50 mg by mouth every 4 (four) hours as needed.     TRUEPLUS 5-BEVEL PEN NEEDLES 32G X 4 MM MISC      fluconazole (DIFLUCAN) 200 MG tablet Take 200 mg by mouth once a week. (Patient not taking: Reported on 12/06/2022)     No current facility-administered medications for this visit.    Past Medical History:  Diagnosis Date   Diabetes mellitus without complication (HCC)      Past Surgical History:  Procedure Laterality Date   NO PAST SURGERIES       Social History   Tobacco Use   Smoking status: Never   Smokeless tobacco: Never  Substance Use Topics   Alcohol use: Never   Drug use: Never      Family History  Problem Relation Age of Onset   Stroke Mother      No Known Allergies  REVIEW OF SYSTEMS (Negative unless checked)   Constitutional: [] Weight loss  [] Fever  [] Chills Cardiac: [] Chest pain   [] Chest pressure   [] Palpitations   [] Shortness of breath when laying flat   [] Shortness of breath at rest   [] Shortness of breath with exertion. 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   [] Asthma Neurologic:  [] Dizziness  [] Blackouts   [] Seizures   [] History of stroke   [] History of TIA  []   Aphasia   [] Temporary blindness   [] Dysphagia   [] Weakness or numbness in arms   [] Weakness or numbness in legs Musculoskeletal:  [x] Arthritis   [] Joint swelling   [x] Joint pain   [] Low back pain Hematologic:  [] Easy bruising  [] Easy bleeding   [] Hypercoagulable state   [] Anemic  [] Hepatitis Gastrointestinal:  [] Blood in stool   [] Vomiting blood  [] Gastroesophageal reflux/heartburn   [] Abdominal pain Genitourinary:  [] Chronic kidney disease   [] Difficult urination  [] Frequent urination  [] Burning with urination   [] Hematuria Skin:  [] Rashes   [] Ulcers   [] Wounds Psychological:  [] History of anxiety   []  History of major depression.  Physical Examination  BP (!) 157/74 (BP Location: Right Wrist)   Pulse (!) 109   Resp 18   Ht 5\' 3"  (1.6 m)   Wt 215 lb (97.5 kg)   LMP  (LMP Unknown)   BMI 38.09 kg/m  Gen:  WD/WN,  NAD Head: [place]/AT, No temporalis wasting. Ear/Nose/Throat: Hearing grossly intact, nares w/o erythema or drainage Eyes: Conjunctiva clear. Sclera non-icteric Neck: Supple.  Trachea midline Pulmonary:  Good air movement, no use of accessory muscles.  Cardiac: RRR, no JVD Vascular:  Vessel Right Left  Radial Palpable Palpable                          PT Not Palpable Not Palpable  DP Not Palpable Not Palpable   Gastrointestinal: soft, non-tender/non-distended. No guarding/reflex.  Musculoskeletal: M/S 5/5 throughout.  No deformity or atrophy.  Severe hyperpigmentation with marked stasis dermatitis changes and blistering of the skin.  3+ bilateral lower extremity edema. Neurologic: Sensation grossly intact in extremities.  Symmetrical.  Speech is fluent.  Psychiatric: Judgment intact, Mood & affect appropriate for pt's clinical situation. Dermatologic: No rashes or ulcers noted.  No cellulitis or open wounds.      Labs No results found for this or any previous visit (from the past 2160 hour(s)).   Radiology No results found.  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  On oxygen therapy.   Essential hypertension blood pressure control important in reducing the progression of atherosclerotic disease. On appropriate oral medications.     Hyperlipidemia lipid control important in reducing the progression of atherosclerotic disease. Continue statin therapy     Type 2 diabetes mellitus with hyperlipidemia (HCC) blood glucose control important in reducing the progression of atherosclerotic disease. Also, involved in wound healing. On appropriate medications.       Georgiana Spinner, NP  02/03/2023 1:58 AM    This note was created with Dragon medical transcription system.  Any errors from dictation are purely unintentional

## 2023-02-05 ENCOUNTER — Encounter (INDEPENDENT_AMBULATORY_CARE_PROVIDER_SITE_OTHER): Payer: Self-pay

## 2023-02-05 ENCOUNTER — Ambulatory Visit (INDEPENDENT_AMBULATORY_CARE_PROVIDER_SITE_OTHER): Payer: 59 | Admitting: Nurse Practitioner

## 2023-02-05 VITALS — BP 143/80 | HR 99 | Resp 16

## 2023-02-05 DIAGNOSIS — I890 Lymphedema, not elsewhere classified: Secondary | ICD-10-CM

## 2023-02-05 NOTE — Progress Notes (Signed)
History of Present Illness  There is no documented history at this time  Assessments & Plan   There are no diagnoses linked to this encounter.    Additional instructions  Subjective:  Patient presents with venous ulcer of the Bilateral lower extremity.    Procedure:  3 layer unna wrap was placed Bilateral lower extremity.   Plan:   Follow up in one week.  

## 2023-02-06 ENCOUNTER — Encounter (INDEPENDENT_AMBULATORY_CARE_PROVIDER_SITE_OTHER): Payer: 59

## 2023-02-13 ENCOUNTER — Ambulatory Visit (INDEPENDENT_AMBULATORY_CARE_PROVIDER_SITE_OTHER): Payer: 59 | Admitting: Nurse Practitioner

## 2023-02-13 ENCOUNTER — Encounter (INDEPENDENT_AMBULATORY_CARE_PROVIDER_SITE_OTHER): Payer: Self-pay | Admitting: Nurse Practitioner

## 2023-02-13 VITALS — BP 157/84 | HR 77 | Resp 16

## 2023-02-13 DIAGNOSIS — E1169 Type 2 diabetes mellitus with other specified complication: Secondary | ICD-10-CM

## 2023-02-13 DIAGNOSIS — E782 Mixed hyperlipidemia: Secondary | ICD-10-CM | POA: Diagnosis not present

## 2023-02-13 DIAGNOSIS — E785 Hyperlipidemia, unspecified: Secondary | ICD-10-CM

## 2023-02-13 DIAGNOSIS — I10 Essential (primary) hypertension: Secondary | ICD-10-CM

## 2023-02-13 DIAGNOSIS — I2694 Multiple subsegmental pulmonary emboli without acute cor pulmonale: Secondary | ICD-10-CM | POA: Diagnosis not present

## 2023-02-13 DIAGNOSIS — I890 Lymphedema, not elsewhere classified: Secondary | ICD-10-CM

## 2023-02-14 ENCOUNTER — Encounter (INDEPENDENT_AMBULATORY_CARE_PROVIDER_SITE_OTHER): Payer: Self-pay | Admitting: Nurse Practitioner

## 2023-02-14 NOTE — Progress Notes (Signed)
MRN : [id number]  Elizabeth Rowland is a 82 y.o. ([birth year]) female who presents with chief complaint of  Chief Complaint  Patient presents with   Follow-up    Unna boot follow up  .  History of Present Illness: Patient returns today in follow up of her leg swelling.  She has been in [employer] for several weeks.  The patient has shown some improvement in her wounds following having the flu and having swelling and ulcerations.  However the wounds are not completely healed at this time.  She has been tolerating the Unna boots well.  Current Outpatient Medications  Medication Sig Dispense Refill   apixaban (ELIQUIS) 5 MG TABS tablet Take 1 tablet (5 mg total) by mouth 2 (two) times daily. 60 tablet 3   gabapentin (NEURONTIN) 300 MG capsule Take 1 capsule by mouth 3 (three) times daily.     hydrOXYzine (ATARAX) 25 MG tablet Take 25 mg by mouth 4 (four) times daily.     insulin glargine (LANTUS) 100 UNIT/ML injection Inject 0.1 mLs (10 Units total) into the skin 2 (two) times daily. 10 mL 11   Ipratropium-Albuterol (COMBIVENT) 20-100 MCG/ACT AERS respimat Inhale 1 puff into the lungs every 6 (six) hours. 4 g 0   ipratropium-albuterol (DUONEB) 0.5-2.5 (3) MG/3ML SOLN Take 3 mLs by nebulization every 4 (four) hours as needed. 360 mL 0   pravastatin (PRAVACHOL) 10 MG tablet Take 10 mg by mouth daily.     spironolactone (ALDACTONE) 25 MG tablet Take 25 mg by mouth daily.     sulfamethoxazole-trimethoprim (BACTRIM DS) 800-160 MG tablet Take 1 tablet by mouth 2 (two) times daily. 20 tablet 0   torsemide (DEMADEX) 20 MG tablet Take by mouth.     traMADol (ULTRAM) 50 MG tablet Take 50 mg by mouth every 4 (four) hours as needed.     TRUEPLUS 5-BEVEL PEN NEEDLES 32G X 4 MM MISC      fluconazole (DIFLUCAN) 200 MG tablet Take 200 mg by mouth once a week. (Patient not taking: Reported on 12/06/2022)     No current facility-administered medications for this visit.    Past Medical History:   Diagnosis Date   Diabetes mellitus without complication (HCC)     Past Surgical History:  Procedure Laterality Date   NO PAST SURGERIES       Social History   Tobacco Use   Smoking status: Never   Smokeless tobacco: Never  Substance Use Topics   Alcohol use: Never   Drug use: Never      Family History  Problem Relation Age of Onset   Stroke Mother      No Known Allergies  REVIEW OF SYSTEMS (Negative unless checked)   Constitutional: [] Weight loss  [] Fever  [] Chills Cardiac: [] Chest pain   [] Chest pressure   [] Palpitations   [] Shortness of breath when laying flat   [] Shortness of breath at rest   [] Shortness of breath with exertion. 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   [] Asthma Neurologic:  [] Dizziness  []   Blackouts   [] Seizures   [] History of stroke   [] History of TIA  [] Aphasia   [] Temporary blindness   [] Dysphagia   [] Weakness or numbness in arms   [] Weakness or numbness in legs Musculoskeletal:  [x] Arthritis   [] Joint swelling   [x] Joint pain   [] Low back pain Hematologic:  [] Easy bruising  [] Easy bleeding   [] Hypercoagulable state   [] Anemic  [] Hepatitis Gastrointestinal:  [] Blood in stool   [] Vomiting blood  [] Gastroesophageal reflux/heartburn   [] Abdominal pain Genitourinary:  [] Chronic kidney disease   [] Difficult urination  [] Frequent urination  [] Burning with urination   [] Hematuria Skin:  [] Rashes   [] Ulcers   [] Wounds Psychological:  [] History of anxiety   []  History of major depression.  Physical Examination  BP (!) 157/84 (BP Location: Right Arm)   Pulse 77   Resp 16   LMP  (LMP Unknown)  Gen:  WD/WN,  NAD Head: [place]/AT, No temporalis wasting. Ear/Nose/Throat: Hearing grossly intact, nares w/o erythema or drainage Eyes: Conjunctiva clear. Sclera non-icteric Neck: Supple.  Trachea midline Pulmonary:  Good air movement, no use of accessory muscles.  Cardiac: RRR, no JVD Vascular:  Vessel Right Left  Radial Palpable Palpable                          PT Not Palpable Not Palpable  DP Not Palpable Not Palpable   Gastrointestinal: soft, non-tender/non-distended. No guarding/reflex.  Musculoskeletal: M/S 5/5 throughout.  No deformity or atrophy.  Severe hyperpigmentation with marked stasis dermatitis changes and blistering of the skin.  3+ bilateral lower extremity edema. Neurologic: Sensation grossly intact in extremities.  Symmetrical.  Speech is fluent.  Psychiatric: Judgment intact, Mood & affect appropriate for pt's clinical situation. Dermatologic: No rashes or ulcers noted.  No cellulitis or open wounds.      Labs No results found for this or any previous visit (from the past 2160 hour(s)).   Radiology No results found.  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  On oxygen therapy.   Essential hypertension blood pressure control important in reducing the progression of atherosclerotic disease. On appropriate oral medications.     Hyperlipidemia lipid control important in reducing the progression of atherosclerotic disease. Continue statin therapy     Type 2 diabetes mellitus with hyperlipidemia (HCC) blood glucose control important in reducing the progression of atherosclerotic disease. Also, involved in wound healing. On appropriate medications.    5. Lymphedema Recommend:  No surgery or intervention at this point in time.   The Patient is CEAP [id number].  The patient has been wearing compression for more than 12 weeks with no or little benefit.  The patient has been exercising daily for more than 12 weeks. The  patient has been elevating and taking OTC pain medications for more than 12 weeks.  None of these have have eliminated the pain related to the lymphedema or the discomfort regarding excessive swelling and venous congestion.    I have reviewed my discussion with the patient regarding lymphedema and why it  causes symptoms.  Patient will continue wearing graduated compression on a daily basis. The patient should put the compression on first thing in the morning and removing them in the evening. The patient should not sleep in the compression.   In addition, behavioral modification throughout the day will be continued.  This will include frequent elevation (such as in a recliner), use of over the counter pain medications as needed and  exercise such as walking.  The systemic causes for chronic edema such as liver, kidney and cardiac etiologies do not appear to have significant changed over the past year.    The patient has chronic , severe lymphedema with hyperpigmentation of the skin and has done MLD, skin care, medication, diet, exercise, elevation and compression for 4 weeks with no improvement,  I am recommending a lymphedema pump.  The patient still has stage 3 lymphedema and therefore, I believe that a lymph pump is needed to improve the control of the patient's lymphedema and improve the quality of life.  Additionally, a lymph pump is warranted because it will reduce the risk of cellulitis and ulceration in the future.      Georgiana Spinner, NP  02/14/2023 1:07 AM    This note was created with Dragon medical transcription system.  Any errors from dictation are purely unintentional

## 2023-02-20 ENCOUNTER — Encounter (INDEPENDENT_AMBULATORY_CARE_PROVIDER_SITE_OTHER): Payer: Self-pay | Admitting: Nurse Practitioner

## 2023-02-20 ENCOUNTER — Ambulatory Visit (INDEPENDENT_AMBULATORY_CARE_PROVIDER_SITE_OTHER): Payer: 59 | Admitting: Nurse Practitioner

## 2023-02-20 VITALS — BP 172/83 | HR 94 | Resp 20

## 2023-02-20 DIAGNOSIS — I890 Lymphedema, not elsewhere classified: Secondary | ICD-10-CM | POA: Diagnosis not present

## 2023-02-20 NOTE — Progress Notes (Signed)
History of Present Illness  There is no documented history at this time  Assessments & Plan   There are no diagnoses linked to this encounter.    Additional instructions  Subjective:  Patient presents with venous ulcer of the Bilateral lower extremity.    Procedure:  3 layer unna wrap was placed Bilateral lower extremity.   Plan:   Follow up in one week.  

## 2023-02-27 ENCOUNTER — Telehealth (INDEPENDENT_AMBULATORY_CARE_PROVIDER_SITE_OTHER): Payer: Self-pay

## 2023-02-27 ENCOUNTER — Ambulatory Visit (INDEPENDENT_AMBULATORY_CARE_PROVIDER_SITE_OTHER): Payer: 59 | Admitting: Nurse Practitioner

## 2023-02-27 ENCOUNTER — Encounter (INDEPENDENT_AMBULATORY_CARE_PROVIDER_SITE_OTHER): Payer: Self-pay | Admitting: Nurse Practitioner

## 2023-02-27 VITALS — BP 158/83 | HR 89 | Resp 18

## 2023-02-27 DIAGNOSIS — I890 Lymphedema, not elsewhere classified: Secondary | ICD-10-CM

## 2023-02-27 NOTE — Telephone Encounter (Signed)
Spoke with pt while in the office and this issue has been handled already.

## 2023-02-27 NOTE — Telephone Encounter (Signed)
I actaully just gave her referral to the person yesterday.  I physically handed it to them so they should reach out to her shortly.  We prefer not to use [facility] and this pump is superior that I have handed the referral to

## 2023-02-27 NOTE — Telephone Encounter (Signed)
Ok thank you I just know that her insurance will cover at [facility] but not [facility] or [facility] per her son.

## 2023-02-27 NOTE — Progress Notes (Signed)
History of Present Illness  There is no documented history at this time  Assessments & Plan   There are no diagnoses linked to this encounter.    Additional instructions  Subjective:  Patient presents with venous ulcer of the Bilateral lower extremity.    Procedure:  3 layer unna wrap was placed Bilateral lower extremity.   Plan:   Follow up in one week.  

## 2023-03-06 ENCOUNTER — Ambulatory Visit (INDEPENDENT_AMBULATORY_CARE_PROVIDER_SITE_OTHER): Payer: 59 | Admitting: Nurse Practitioner

## 2023-03-06 ENCOUNTER — Encounter (INDEPENDENT_AMBULATORY_CARE_PROVIDER_SITE_OTHER): Payer: Self-pay | Admitting: Nurse Practitioner

## 2023-03-06 VITALS — BP 161/82 | HR 105 | Resp 19

## 2023-03-06 DIAGNOSIS — I890 Lymphedema, not elsewhere classified: Secondary | ICD-10-CM

## 2023-03-06 NOTE — Progress Notes (Signed)
History of Present Illness  There is no documented history at this time  Assessments & Plan   There are no diagnoses linked to this encounter.    Additional instructions  Subjective:  Patient presents with venous ulcer of the Bilateral lower extremity.    Procedure:  3 layer unna wrap was placed Bilateral lower extremity.   Plan:   Follow up in one week.  

## 2023-03-11 ENCOUNTER — Ambulatory Visit (INDEPENDENT_AMBULATORY_CARE_PROVIDER_SITE_OTHER): Payer: 59 | Admitting: Vascular Surgery

## 2023-03-14 ENCOUNTER — Ambulatory Visit (INDEPENDENT_AMBULATORY_CARE_PROVIDER_SITE_OTHER): Payer: 59 | Admitting: Vascular Surgery

## 2023-03-14 ENCOUNTER — Encounter (INDEPENDENT_AMBULATORY_CARE_PROVIDER_SITE_OTHER): Payer: Self-pay | Admitting: Vascular Surgery

## 2023-03-14 VITALS — BP 152/80 | HR 95 | Resp 16

## 2023-03-14 DIAGNOSIS — I890 Lymphedema, not elsewhere classified: Secondary | ICD-10-CM

## 2023-03-14 DIAGNOSIS — I10 Essential (primary) hypertension: Secondary | ICD-10-CM

## 2023-03-14 DIAGNOSIS — E782 Mixed hyperlipidemia: Secondary | ICD-10-CM | POA: Diagnosis not present

## 2023-03-14 DIAGNOSIS — M7989 Other specified soft tissue disorders: Secondary | ICD-10-CM

## 2023-03-14 DIAGNOSIS — I2694 Multiple subsegmental pulmonary emboli without acute cor pulmonale: Secondary | ICD-10-CM

## 2023-03-14 DIAGNOSIS — E785 Hyperlipidemia, unspecified: Secondary | ICD-10-CM

## 2023-03-14 DIAGNOSIS — E1169 Type 2 diabetes mellitus with other specified complication: Secondary | ICD-10-CM | POA: Diagnosis not present

## 2023-03-14 NOTE — Progress Notes (Signed)
MRN : [id number]  Elizabeth Rowland is a 82 y.o. ([birth year]) female who presents with chief complaint of  Chief Complaint  Patient presents with   Follow-up    4 week unna check  .  History of Present Illness: Patient returns today in follow up of her lymphedema, leg swelling, and ulceration.  She has now been in [employer] for a few months.  This has resulted in significant improvement in her swelling.  She also got her lymphedema pump in the last couple of weeks but has not began using it yet.  Her ulcers have healed.  Her legs generally feel better.  She and her son today say that they have not yet gotten her compression socks  Current Outpatient Medications  Medication Sig Dispense Refill   apixaban (ELIQUIS) 5 MG TABS tablet Take 1 tablet (5 mg total) by mouth 2 (two) times daily. 60 tablet 3   gabapentin (NEURONTIN) 300 MG capsule Take 1 capsule by mouth 3 (three) times daily.     hydrOXYzine (ATARAX) 25 MG tablet Take 25 mg by mouth 4 (four) times daily.     insulin glargine (LANTUS) 100 UNIT/ML injection Inject 0.1 mLs (10 Units total) into the skin 2 (two) times daily. 10 mL 11   Ipratropium-Albuterol (COMBIVENT) 20-100 MCG/ACT AERS respimat Inhale 1 puff into the lungs every 6 (six) hours. 4 g 0   ipratropium-albuterol (DUONEB) 0.5-2.5 (3) MG/3ML SOLN Take 3 mLs by nebulization every 4 (four) hours as needed. 360 mL 0   LANTUS SOLOSTAR 100 UNIT/ML Solostar Pen Inject into the skin.     pravastatin (PRAVACHOL) 10 MG tablet Take 10 mg by mouth daily.     spironolactone (ALDACTONE) 25 MG tablet Take 25 mg by mouth daily.     traMADol (ULTRAM) 50 MG tablet Take 50 mg by mouth every 4 (four) hours as needed.     TRUEPLUS 5-BEVEL PEN NEEDLES 32G X 4 MM MISC      fluconazole (DIFLUCAN) 200 MG tablet Take 200 mg by mouth once a week. (Patient not taking: Reported on 12/06/2022)     sulfamethoxazole-trimethoprim (BACTRIM DS) 800-160 MG tablet Take 1 tablet by mouth 2 (two) times  daily. (Patient not taking: Reported on 03/06/2023) 20 tablet 0   torsemide (DEMADEX) 20 MG tablet Take by mouth.     No current facility-administered medications for this visit.    Past Medical History:  Diagnosis Date   Diabetes mellitus without complication (HCC)     Past Surgical History:  Procedure Laterality Date   NO PAST SURGERIES       Social History   Tobacco Use   Smoking status: Never   Smokeless tobacco: Never  Substance Use Topics   Alcohol use: Never   Drug use: Never      Family History  Problem Relation Age of Onset   Stroke Mother      No Known Allergies   REVIEW OF SYSTEMS (Negative unless checked)   Constitutional: [] Weight loss  [] Fever  [] Chills Cardiac: [] Chest pain   [] Chest pressure   [] Palpitations   [] Shortness of breath when laying flat   [] Shortness of breath at rest   [] Shortness of breath with exertion. 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   [] Asthma Neurologic:  [] Dizziness  [] Blackouts   [] Seizures   [] History of stroke   [] History of TIA  [] Aphasia   [] Temporary blindness   [] Dysphagia   [] Weakness or numbness in arms   [] Weakness or numbness in legs Musculoskeletal:  [x] Arthritis   [] Joint swelling   [x] Joint pain   [] Low back pain Hematologic:  [] Easy bruising  [] Easy bleeding   [] Hypercoagulable state   [] Anemic  [] Hepatitis Gastrointestinal:  [] Blood in stool   [] Vomiting blood  [] Gastroesophageal reflux/heartburn   [] Abdominal pain Genitourinary:  [] Chronic kidney disease   [] Difficult urination  [] Frequent urination  [] Burning with urination   [] Hematuria Skin:  [] Rashes   [] Ulcers    [] Wounds Psychological:  [] History of anxiety   []  History of major depression.  Physical Examination  BP (!) 152/80 (BP Location: Right Arm)   Pulse 95   Resp 16   LMP  (LMP Unknown)  Gen:  WD/WN, NAD Head: [place]/AT, No temporalis wasting. Ear/Nose/Throat: Hearing grossly intact, nares w/o erythema or drainage Eyes: Conjunctiva clear. Sclera non-icteric Neck: Supple.  Trachea midline Pulmonary:  Good air movement, no use of accessory muscles.  Cardiac: Somewhat irregular and tachycardic Vascular:  Vessel Right Left  Radial Palpable Palpable       Musculoskeletal: M/S 5/5 throughout.  No deformity or atrophy.  Skin has healed.  Stasis dermatitis changes are present.  1+ bilateral lower extremity edema. Neurologic: Sensation grossly intact in extremities.  Symmetrical.  Speech is fluent.  Psychiatric: Judgment intact, Mood & affect appropriate for pt's clinical situation. Dermatologic: No rashes or ulcers noted.  No cellulitis or open wounds.     Labs No results found for this or any previous visit (from the past 2160 hour(s)).  Radiology No results found.  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  On oxygen therapy.   Essential hypertension blood pressure control important in reducing the progression of atherosclerotic disease. On appropriate oral medications.     Hyperlipidemia lipid control important in reducing the progression of atherosclerotic disease. Continue statin therapy     Type 2 diabetes mellitus with hyperlipidemia (HCC) blood glucose control important in reducing the progression of atherosclerotic disease. Also, involved in wound healing. On appropriate medications.  Lymphedema Leg swelling and ulceration are better.  She should begin using her lymphedema pump.  They do not have compression socks to use currently, so we are going to go back to [employer] boots for 1 more week on each leg.  3 layer Unna boot was placed  bilaterally.  When she returns next week, they should have already purchased their compression socks and we can begin using those daily after she comes out of the Unna boots next week.  Follow-up in 3 months.    Festus Barren, MD  03/14/2023 2:15 PM    This note was created with Dragon medical transcription system.  Any errors from dictation are purely unintentional

## 2023-03-14 NOTE — Assessment & Plan Note (Signed)
Leg swelling and ulceration are better.  She should begin using her lymphedema pump.  They do not have compression socks to use currently, so we are going to go back to [employer] boots for 1 more week on each leg.  3 layer Unna boot was placed bilaterally.  When she returns next week, they should have already purchased their compression socks and we can begin using those daily after she comes out of the Unna boots next week.  Follow-up in 3 months.

## 2023-03-19 ENCOUNTER — Encounter (INDEPENDENT_AMBULATORY_CARE_PROVIDER_SITE_OTHER): Payer: Self-pay

## 2023-03-19 ENCOUNTER — Ambulatory Visit (INDEPENDENT_AMBULATORY_CARE_PROVIDER_SITE_OTHER): Payer: 59 | Admitting: Nurse Practitioner

## 2023-03-19 VITALS — BP 142/76 | HR 92 | Resp 16

## 2023-03-19 DIAGNOSIS — I890 Lymphedema, not elsewhere classified: Secondary | ICD-10-CM

## 2023-03-19 NOTE — Progress Notes (Unsigned)
History of Present Illness  There is no documented history at this time  Assessments & Plan   There are no diagnoses linked to this encounter.    Additional instructions  Subjective:  Patient presents with venous ulcer of the Bilateral lower extremity.    Procedure:  3 layer unna wrap was placed Bilateral lower extremity.   Plan:   Follow up in one week.  

## 2023-03-20 ENCOUNTER — Encounter (INDEPENDENT_AMBULATORY_CARE_PROVIDER_SITE_OTHER): Payer: Self-pay | Admitting: Nurse Practitioner

## 2023-03-27 ENCOUNTER — Encounter (INDEPENDENT_AMBULATORY_CARE_PROVIDER_SITE_OTHER): Payer: Self-pay

## 2023-03-27 ENCOUNTER — Ambulatory Visit (INDEPENDENT_AMBULATORY_CARE_PROVIDER_SITE_OTHER): Payer: 59 | Admitting: Nurse Practitioner

## 2023-03-27 VITALS — BP 164/84 | HR 110 | Resp 20 | Ht 63.0 in | Wt 215.0 lb

## 2023-03-27 DIAGNOSIS — I890 Lymphedema, not elsewhere classified: Secondary | ICD-10-CM | POA: Diagnosis not present

## 2023-03-27 NOTE — Progress Notes (Signed)
History of Present Illness  There is no documented history at this time  Assessments & Plan   There are no diagnoses linked to this encounter.    Additional instructions  Subjective:  Patient presents with venous ulcer of the Bilateral lower extremity.    Procedure:  3 layer unna wrap was placed Bilateral lower extremity.   Plan:   Follow up in one week.  

## 2023-03-31 ENCOUNTER — Telehealth (INDEPENDENT_AMBULATORY_CARE_PROVIDER_SITE_OTHER): Payer: Self-pay

## 2023-03-31 NOTE — Telephone Encounter (Signed)
Clinical notes faxed 03/28/23 and 03/31/23 to [facility]. Okey Regal calledstating she hadn't receive them at 8:45 am and [name] Lanora Manis son) has been calling back and forward to confirm that the fax has been sent.   Okey Regal called at 4:25 pm stating she just noticed the fax machine has been with out paper since Friday and that she has received both fax.

## 2023-04-03 ENCOUNTER — Encounter (INDEPENDENT_AMBULATORY_CARE_PROVIDER_SITE_OTHER): Payer: Self-pay

## 2023-04-03 ENCOUNTER — Ambulatory Visit (INDEPENDENT_AMBULATORY_CARE_PROVIDER_SITE_OTHER): Payer: 59 | Admitting: Nurse Practitioner

## 2023-04-03 VITALS — BP 142/81 | HR 86 | Resp 18

## 2023-04-03 DIAGNOSIS — I890 Lymphedema, not elsewhere classified: Secondary | ICD-10-CM

## 2023-04-03 NOTE — Progress Notes (Signed)
Patient removed from unna boots and placed in compression socks with Velcro compression wraps  Follow up 3 months no studies with prn option 06/13/23

## 2023-06-13 ENCOUNTER — Ambulatory Visit (INDEPENDENT_AMBULATORY_CARE_PROVIDER_SITE_OTHER): Payer: 59 | Admitting: Nurse Practitioner

## 2023-06-24 ENCOUNTER — Encounter (INDEPENDENT_AMBULATORY_CARE_PROVIDER_SITE_OTHER): Payer: Self-pay | Admitting: Nurse Practitioner

## 2023-06-24 ENCOUNTER — Ambulatory Visit (INDEPENDENT_AMBULATORY_CARE_PROVIDER_SITE_OTHER): Payer: 59 | Admitting: Nurse Practitioner

## 2023-06-24 VITALS — BP 155/77 | HR 101 | Resp 16

## 2023-06-24 DIAGNOSIS — I10 Essential (primary) hypertension: Secondary | ICD-10-CM | POA: Diagnosis not present

## 2023-06-24 DIAGNOSIS — E785 Hyperlipidemia, unspecified: Secondary | ICD-10-CM

## 2023-06-24 DIAGNOSIS — I890 Lymphedema, not elsewhere classified: Secondary | ICD-10-CM

## 2023-06-24 DIAGNOSIS — E1169 Type 2 diabetes mellitus with other specified complication: Secondary | ICD-10-CM

## 2023-06-25 ENCOUNTER — Encounter (INDEPENDENT_AMBULATORY_CARE_PROVIDER_SITE_OTHER): Payer: Self-pay | Admitting: Nurse Practitioner

## 2023-06-25 NOTE — Progress Notes (Signed)
Subjective:    Patient ID: Elizabeth Rowland, female    DOB: [birth year], 82 y.o.   MRN: [id number] Chief Complaint  Patient presents with   Follow-up    3 month follow up    The patient returns to the office for followup evaluation regarding leg swelling.  The swelling has persisted but with the lymph pump is under much, much better controlled. The pain associated with swelling is decreased. There have not been any interval development of a ulcerations or wounds.  The patient denies problems with the pump, noting it is working well and the leggings are in good condition.  Since the previous visit the patient has been wearing graduated compression stockings and using the lymph pump on a routine basis and  has noted significant improvement in the lymphedema.   Patient stated the lymph pump has been helpful with the treatment of the lymphedema.      Review of Systems  Cardiovascular:  Positive for leg swelling.  Musculoskeletal:  Positive for gait problem.  Skin:  Negative for wound.  All other systems reviewed and are negative.      Objective:   Physical Exam Vitals reviewed.  HENT:     Head: Normocephalic.  Cardiovascular:     Rate and Rhythm: Normal rate.  Pulmonary:     Effort: Pulmonary effort is normal.  Musculoskeletal:     Right lower leg: Edema present.     Left lower leg: Edema present.  Skin:    General: Skin is warm and dry.  Neurological:     Mental Status: [name] and oriented to person, place, and time.  Psychiatric:        Mood and Affect: Mood normal.        Behavior: Behavior normal.        Thought Content: Thought content normal.        Judgment: Judgment normal.     BP (!) 155/77 (BP Location: Left Arm)   Pulse (!) 101   Resp 16   LMP  (LMP Unknown)   Past Medical History:  Diagnosis Date   Diabetes mellitus without complication (HCC)     Social History   Socioeconomic History   Marital status: Married    Spouse name: Not on  file   Number of children: Not on file   Years of education: Not on file   Highest education level: Not on file  Occupational History   Not on file  Tobacco Use   Smoking status: Never   Smokeless tobacco: Never  Substance and Sexual Activity   Alcohol use: Never   Drug use: Never   Sexual activity: Not Currently  Other Topics Concern   Not on file  Social History Narrative   Not on file   Social Determinants of Health   Financial Resource Strain: Not on file  Food Insecurity: No Food Insecurity (09/03/2022)   Hunger Vital Sign    Worried About Running Out of Food in the Last Year: Never true    Ran Out of Food in the Last Year: Never true  Transportation Needs: No Transportation Needs (09/03/2022)   PRAPARE - Administrator, Civil Service (Medical): No    Lack of Transportation (Non-Medical): No  Physical Activity: Not on file  Stress: Not on file  Social Connections: Not on file  Intimate Partner Violence: Not At Risk (09/03/2022)   Humiliation, Afraid, Rape, and Kick questionnaire    Fear of Current or Ex-Partner:  No    Emotionally Abused: No    Physically Abused: No    Sexually Abused: No    Past Surgical History:  Procedure Laterality Date   NO PAST SURGERIES      Family History  Problem Relation Age of Onset   Stroke Mother     No Known Allergies     Latest Ref Rng & Units 09/13/2022    5:04 AM 09/12/2022    6:02 AM 09/11/2022    4:20 AM  CBC  WBC 4.0 - 10.5 K/uL 16.5  16.3  18.3   Hemoglobin 12.0 - 15.0 g/dL 16.1  09.6  04.5   Hematocrit 36.0 - 46.0 % 38.3  39.5  41.9   Platelets 150 - 400 K/uL 339  316  288       CMP     Component Value Date/Time   NA 138 09/13/2022 0504   K 4.2 09/13/2022 0504   CL 97 (L) 09/13/2022 0504   CO2 33 (H) 09/13/2022 0504   GLUCOSE 112 (H) 09/13/2022 0504   BUN 17 09/13/2022 0504   CREATININE 1.00 09/13/2022 0504   CALCIUM 8.5 (L) 09/13/2022 0504   PROT 7.3 09/03/2022 0122   ALBUMIN 3.3 (L)  09/03/2022 0122   AST 83 (H) 09/03/2022 0122   ALT 40 09/03/2022 0122   ALKPHOS 40 09/03/2022 0122   BILITOT 1.0 09/03/2022 0122   GFRNONAA 56 (L) 09/13/2022 0504     No results found.     Assessment & Plan:   1. Lymphedema Recommend:  No surgery or intervention at this point in time.    I have reviewed my discussion with the patient regarding lymphedema and why it  causes symptoms.  Patient will continue wearing graduated compression on a daily basis. The patient should put the compression on first thing in the morning and removing them in the evening. The patient should not sleep in the compression.   In addition, behavioral modification throughout the day will be continued.  This will include frequent elevation (such as in a recliner), use of over the counter pain medications as needed and exercise such as walking.  The systemic causes for chronic edema such as liver, kidney and cardiac etiologies does not appear to have significant changed over the past year.    The patient will continue aggressive use of the  lymph pump.  This will continue to improve the edema control and prevent sequela such as ulcers and infections.  Patient will follow-up in 6 months 2. Type 2 diabetes mellitus with hyperlipidemia (HCC) Continue hypoglycemic medications as already ordered, these medications have been reviewed and there are no changes at this time.  Hgb A1C to be monitored as already arranged by primary service  3. Essential hypertension Continue antihypertensive medications as already ordered, these medications have been reviewed and there are no changes at this time.   Current Outpatient Medications on File Prior to Visit  Medication Sig Dispense Refill   apixaban (ELIQUIS) 5 MG TABS tablet Take 1 tablet (5 mg total) by mouth 2 (two) times daily. 60 tablet 3   gabapentin (NEURONTIN) 300 MG capsule Take 1 capsule by mouth 3 (three) times daily.     hydrOXYzine (ATARAX) 25 MG tablet  Take 25 mg by mouth 4 (four) times daily.     insulin glargine (LANTUS) 100 UNIT/ML injection Inject 0.1 mLs (10 Units total) into the skin 2 (two) times daily. 10 mL 11   Ipratropium-Albuterol (COMBIVENT) 20-100 MCG/ACT AERS respimat  Inhale 1 puff into the lungs every 6 (six) hours. 4 g 0   ipratropium-albuterol (DUONEB) 0.5-2.5 (3) MG/3ML SOLN Take 3 mLs by nebulization every 4 (four) hours as needed. 360 mL 0   LANTUS SOLOSTAR 100 UNIT/ML Solostar Pen Inject into the skin.     pravastatin (PRAVACHOL) 10 MG tablet Take 10 mg by mouth daily.     traMADol (ULTRAM) 50 MG tablet Take 50 mg by mouth every 4 (four) hours as needed.     TRUEPLUS 5-BEVEL PEN NEEDLES 32G X 4 MM MISC      fluconazole (DIFLUCAN) 200 MG tablet Take 200 mg by mouth once a week. (Patient not taking: Reported on 12/06/2022)     sulfamethoxazole-trimethoprim (BACTRIM DS) 800-160 MG tablet Take 1 tablet by mouth 2 (two) times daily. (Patient not taking: Reported on 03/06/2023) 20 tablet 0   torsemide (DEMADEX) 20 MG tablet Take by mouth.     No current facility-administered medications on file prior to visit.    There are no Patient Instructions on file for this visit. No follow-ups on file.   Georgiana Spinner, NP

## 2023-10-24 ENCOUNTER — Encounter: Payer: Self-pay | Admitting: Internal Medicine

## 2023-10-24 ENCOUNTER — Inpatient Hospital Stay
Admission: EM | Admit: 2023-10-24 | Discharge: 2023-11-15 | DRG: 196 | Disposition: E | Payer: 59 | Attending: Internal Medicine | Admitting: Internal Medicine

## 2023-10-24 ENCOUNTER — Other Ambulatory Visit: Payer: Self-pay

## 2023-10-24 ENCOUNTER — Emergency Department: Payer: 59

## 2023-10-24 DIAGNOSIS — J189 Pneumonia, unspecified organism: Secondary | ICD-10-CM | POA: Diagnosis present

## 2023-10-24 DIAGNOSIS — J9621 Acute and chronic respiratory failure with hypoxia: Secondary | ICD-10-CM | POA: Diagnosis present

## 2023-10-24 DIAGNOSIS — J439 Emphysema, unspecified: Secondary | ICD-10-CM | POA: Diagnosis not present

## 2023-10-24 DIAGNOSIS — I2782 Chronic pulmonary embolism: Secondary | ICD-10-CM

## 2023-10-24 DIAGNOSIS — I5032 Chronic diastolic (congestive) heart failure: Secondary | ICD-10-CM | POA: Diagnosis not present

## 2023-10-24 DIAGNOSIS — E785 Hyperlipidemia, unspecified: Secondary | ICD-10-CM | POA: Diagnosis not present

## 2023-10-24 DIAGNOSIS — J9601 Acute respiratory failure with hypoxia: Principal | ICD-10-CM

## 2023-10-24 DIAGNOSIS — E119 Type 2 diabetes mellitus without complications: Secondary | ICD-10-CM

## 2023-10-24 DIAGNOSIS — F419 Anxiety disorder, unspecified: Secondary | ICD-10-CM | POA: Diagnosis present

## 2023-10-24 DIAGNOSIS — R6520 Severe sepsis without septic shock: Secondary | ICD-10-CM

## 2023-10-24 DIAGNOSIS — E669 Obesity, unspecified: Secondary | ICD-10-CM | POA: Diagnosis present

## 2023-10-24 DIAGNOSIS — I82409 Acute embolism and thrombosis of unspecified deep veins of unspecified lower extremity: Secondary | ICD-10-CM | POA: Diagnosis present

## 2023-10-24 DIAGNOSIS — I5A Non-ischemic myocardial injury (non-traumatic): Secondary | ICD-10-CM | POA: Diagnosis present

## 2023-10-24 DIAGNOSIS — J449 Chronic obstructive pulmonary disease, unspecified: Secondary | ICD-10-CM | POA: Diagnosis present

## 2023-10-24 DIAGNOSIS — R9431 Abnormal electrocardiogram [ECG] [EKG]: Secondary | ICD-10-CM | POA: Diagnosis present

## 2023-10-24 DIAGNOSIS — A419 Sepsis, unspecified organism: Secondary | ICD-10-CM

## 2023-10-24 DIAGNOSIS — I2699 Other pulmonary embolism without acute cor pulmonale: Secondary | ICD-10-CM | POA: Diagnosis present

## 2023-10-24 DIAGNOSIS — Z794 Long term (current) use of insulin: Secondary | ICD-10-CM

## 2023-10-24 DIAGNOSIS — Z1152 Encounter for screening for COVID-19: Secondary | ICD-10-CM | POA: Diagnosis not present

## 2023-10-24 DIAGNOSIS — E872 Acidosis, unspecified: Secondary | ICD-10-CM | POA: Diagnosis present

## 2023-10-24 DIAGNOSIS — D638 Anemia in other chronic diseases classified elsewhere: Secondary | ICD-10-CM | POA: Diagnosis present

## 2023-10-24 DIAGNOSIS — Z7189 Other specified counseling: Secondary | ICD-10-CM | POA: Diagnosis not present

## 2023-10-24 DIAGNOSIS — Z86718 Personal history of other venous thrombosis and embolism: Secondary | ICD-10-CM

## 2023-10-24 DIAGNOSIS — Z7901 Long term (current) use of anticoagulants: Secondary | ICD-10-CM

## 2023-10-24 DIAGNOSIS — J8410 Pulmonary fibrosis, unspecified: Secondary | ICD-10-CM | POA: Diagnosis present

## 2023-10-24 DIAGNOSIS — N179 Acute kidney failure, unspecified: Secondary | ICD-10-CM | POA: Diagnosis not present

## 2023-10-24 DIAGNOSIS — E1165 Type 2 diabetes mellitus with hyperglycemia: Secondary | ICD-10-CM | POA: Diagnosis present

## 2023-10-24 DIAGNOSIS — R54 Age-related physical debility: Secondary | ICD-10-CM | POA: Diagnosis present

## 2023-10-24 DIAGNOSIS — Z823 Family history of stroke: Secondary | ICD-10-CM

## 2023-10-24 DIAGNOSIS — I110 Hypertensive heart disease with heart failure: Secondary | ICD-10-CM | POA: Diagnosis present

## 2023-10-24 DIAGNOSIS — Z86711 Personal history of pulmonary embolism: Secondary | ICD-10-CM

## 2023-10-24 DIAGNOSIS — Z6836 Body mass index (BMI) 36.0-36.9, adult: Secondary | ICD-10-CM | POA: Diagnosis not present

## 2023-10-24 DIAGNOSIS — J440 Chronic obstructive pulmonary disease with acute lower respiratory infection: Secondary | ICD-10-CM | POA: Diagnosis present

## 2023-10-24 DIAGNOSIS — Z515 Encounter for palliative care: Secondary | ICD-10-CM

## 2023-10-24 DIAGNOSIS — Z66 Do not resuscitate: Secondary | ICD-10-CM | POA: Diagnosis present

## 2023-10-24 DIAGNOSIS — I890 Lymphedema, not elsewhere classified: Secondary | ICD-10-CM | POA: Diagnosis present

## 2023-10-24 DIAGNOSIS — E66812 Obesity, class 2: Secondary | ICD-10-CM | POA: Diagnosis present

## 2023-10-24 DIAGNOSIS — Z79899 Other long term (current) drug therapy: Secondary | ICD-10-CM | POA: Diagnosis not present

## 2023-10-24 DIAGNOSIS — D72828 Other elevated white blood cell count: Secondary | ICD-10-CM | POA: Diagnosis present

## 2023-10-24 DIAGNOSIS — F410 Panic disorder [episodic paroxysmal anxiety] without agoraphobia: Secondary | ICD-10-CM | POA: Diagnosis present

## 2023-10-24 DIAGNOSIS — R0603 Acute respiratory distress: Secondary | ICD-10-CM | POA: Diagnosis not present

## 2023-10-24 HISTORY — DX: Chronic obstructive pulmonary disease, unspecified: J44.9

## 2023-10-24 HISTORY — DX: Acute embolism and thrombosis of unspecified deep veins of unspecified lower extremity: I82.409

## 2023-10-24 HISTORY — DX: Type 2 diabetes mellitus without complications: E11.9

## 2023-10-24 HISTORY — DX: Chronic diastolic (congestive) heart failure: I50.32

## 2023-10-24 HISTORY — DX: Other pulmonary embolism without acute cor pulmonale: I26.99

## 2023-10-24 HISTORY — DX: Anxiety disorder, unspecified: F41.9

## 2023-10-24 HISTORY — DX: Essential (primary) hypertension: I10

## 2023-10-24 HISTORY — DX: Hyperlipidemia, unspecified: E78.5

## 2023-10-24 LAB — COMPREHENSIVE METABOLIC PANEL
ALT: 12 U/L (ref 0–44)
AST: 19 U/L (ref 15–41)
Albumin: 3 g/dL — ABNORMAL LOW (ref 3.5–5.0)
Alkaline Phosphatase: 47 U/L (ref 38–126)
Anion gap: 10 (ref 5–15)
BUN: 28 mg/dL — ABNORMAL HIGH (ref 8–23)
CO2: 25 mmol/L (ref 22–32)
Calcium: 8.4 mg/dL — ABNORMAL LOW (ref 8.9–10.3)
Chloride: 104 mmol/L (ref 98–111)
Creatinine, Ser: 0.92 mg/dL (ref 0.44–1.00)
GFR, Estimated: >60 mL/min (ref >60)
Glucose, Bld: 123 mg/dL — ABNORMAL HIGH (ref 70–99)
Potassium: 3.7 mmol/L (ref 3.5–5.1)
Sodium: 139 mmol/L (ref 135–145)
Total Bilirubin: 1.1 mg/dL (ref 0.0–1.2)
Total Protein: 7.6 g/dL (ref 6.5–8.1)

## 2023-10-24 LAB — CBC
HCT: 27.5 % — ABNORMAL LOW (ref 36.0–46.0)
Hemoglobin: 7.6 g/dL — ABNORMAL LOW (ref 12.0–15.0)
MCH: 22 pg — ABNORMAL LOW (ref 26.0–34.0)
MCHC: 27.6 g/dL — ABNORMAL LOW (ref 30.0–36.0)
MCV: 79.7 fL — ABNORMAL LOW (ref 80.0–100.0)
Platelets: 142 10*3/uL — ABNORMAL LOW (ref 150–400)
RBC: 3.45 MIL/uL — ABNORMAL LOW (ref 3.87–5.11)
RDW: 19.6 % — ABNORMAL HIGH (ref 11.5–15.5)
WBC: 24.3 10*3/uL — ABNORMAL HIGH (ref 4.0–10.5)
nRBC: 0.6 % — ABNORMAL HIGH (ref 0.0–0.2)

## 2023-10-24 LAB — RESP PANEL BY RT-PCR (RSV, FLU A&B, COVID)  RVPGX2
Influenza A by PCR: NEGATIVE
Influenza B by PCR: NEGATIVE
Resp Syncytial Virus by PCR: NEGATIVE
SARS Coronavirus 2 by RT PCR: NEGATIVE

## 2023-10-24 LAB — PROCALCITONIN: Procalcitonin: <0.1 ng/mL

## 2023-10-24 LAB — CBG MONITORING, ED: Glucose-Capillary: 102 mg/dL — ABNORMAL HIGH (ref 70–99)

## 2023-10-24 LAB — LACTIC ACID, PLASMA
Lactic Acid, Venous: 2.1 mmol/L (ref 0.5–1.9)
Lactic Acid, Venous: 1.3 mmol/L (ref 0.5–1.9)

## 2023-10-24 LAB — TROPONIN I (HIGH SENSITIVITY): Troponin I (High Sensitivity): 70 ng/L — ABNORMAL HIGH (ref <18)

## 2023-10-24 LAB — BRAIN NATRIURETIC PEPTIDE: B Natriuretic Peptide: 372.4 pg/mL — ABNORMAL HIGH (ref 0.0–100.0)

## 2023-10-24 MED ORDER — OXYCODONE-ACETAMINOPHEN 5-325 MG PO TABS
2.0000 | ORAL_TABLET | Freq: Four times a day (QID) | ORAL | Status: DC | PRN
  Administered 2023-10-24: 1 via ORAL
  Administered 2023-10-25 – 2023-11-07 (×26): 2 via ORAL
  Filled 2023-10-24 (×27): qty 2

## 2023-10-24 MED ORDER — FUROSEMIDE 10 MG/ML IJ SOLN: 40.0000 mg | Freq: Once | INTRAMUSCULAR | Status: DC

## 2023-10-24 MED ORDER — HYDROXYZINE HCL 25 MG PO TABS
25.0000 mg | ORAL_TABLET | ORAL | Status: DC | PRN
  Administered 2023-10-25 – 2023-11-07 (×19): 25 mg via ORAL
  Filled 2023-10-24 (×20): qty 1

## 2023-10-24 MED ORDER — SODIUM CHLORIDE 0.9 % IV SOLN: 500.0000 mg | INTRAVENOUS | Status: DC

## 2023-10-24 MED ORDER — HYDRALAZINE HCL 20 MG/ML IJ SOLN: 5.0000 mg | INTRAMUSCULAR | Status: DC | PRN

## 2023-10-24 MED ORDER — SODIUM CHLORIDE 0.9 % IV SOLN
500.0000 mg | Freq: Once | INTRAVENOUS | Status: AC
  Administered 2023-10-24: 500 mg via INTRAVENOUS
  Filled 2023-10-24: qty 5

## 2023-10-24 MED ORDER — PRAVASTATIN SODIUM 20 MG PO TABS
40.0000 mg | ORAL_TABLET | Freq: Every day | ORAL | Status: DC
  Administered 2023-10-25 – 2023-11-06 (×12): 40 mg via ORAL
  Filled 2023-10-24: qty 1
  Filled 2023-10-24: qty 2
  Filled 2023-10-24: qty 1
  Filled 2023-10-24 (×3): qty 2
  Filled 2023-10-24 (×2): qty 1
  Filled 2023-10-24 (×2): qty 2
  Filled 2023-10-24: qty 1
  Filled 2023-10-24: qty 2

## 2023-10-24 MED ORDER — GABAPENTIN 300 MG PO CAPS
300.0000 mg | ORAL_CAPSULE | Freq: Three times a day (TID) | ORAL | Status: DC
  Administered 2023-10-24 – 2023-11-07 (×38): 300 mg via ORAL
  Filled 2023-10-24 (×23): qty 1
  Filled 2023-10-24: qty 3
  Filled 2023-10-24 (×15): qty 1

## 2023-10-24 MED ORDER — SODIUM CHLORIDE 0.9 % IV SOLN
2.0000 g | Freq: Once | INTRAVENOUS | Status: AC
  Administered 2023-10-24: 2 g via INTRAVENOUS
  Filled 2023-10-24: qty 20

## 2023-10-24 MED ORDER — SODIUM CHLORIDE 0.9 % IV SOLN
100.0000 mg | Freq: Two times a day (BID) | INTRAVENOUS | Status: DC
  Administered 2023-10-24 – 2023-10-29 (×10): 100 mg via INTRAVENOUS
  Filled 2023-10-24 (×11): qty 100

## 2023-10-24 MED ORDER — TRAMADOL HCL 50 MG PO TABS: 100.0000 mg | ORAL_TABLET | Freq: Three times a day (TID) | ORAL | Status: DC

## 2023-10-24 MED ORDER — IPRATROPIUM-ALBUTEROL 0.5-2.5 (3) MG/3ML IN SOLN
3.0000 mL | RESPIRATORY_TRACT | Status: DC
  Administered 2023-10-24 – 2023-10-30 (×32): 3 mL via RESPIRATORY_TRACT
  Filled 2023-10-24 (×31): qty 3

## 2023-10-24 MED ORDER — TRAMADOL HCL 50 MG PO TABS
50.0000 mg | ORAL_TABLET | Freq: Four times a day (QID) | ORAL | Status: DC | PRN
  Administered 2023-10-24: 50 mg via ORAL
  Filled 2023-10-24: qty 1

## 2023-10-24 MED ORDER — ALBUTEROL SULFATE (2.5 MG/3ML) 0.083% IN NEBU: 2.5000 mg | INHALATION_SOLUTION | RESPIRATORY_TRACT | Status: DC | PRN

## 2023-10-24 MED ORDER — INSULIN GLARGINE-YFGN 100 UNIT/ML ~~LOC~~ SOLN
10.0000 [IU] | Freq: Every day | SUBCUTANEOUS | Status: DC
  Administered 2023-10-25 – 2023-11-06 (×14): 10 [IU] via SUBCUTANEOUS
  Filled 2023-10-24 (×16): qty 0.1

## 2023-10-24 MED ORDER — INSULIN ASPART 100 UNIT/ML IJ SOLN
0.0000 [IU] | Freq: Every day | INTRAMUSCULAR | Status: DC
  Administered 2023-10-30 – 2023-11-01 (×2): 3 [IU] via SUBCUTANEOUS
  Administered 2023-11-02 – 2023-11-04 (×2): 2 [IU] via SUBCUTANEOUS
  Filled 2023-10-24 (×4): qty 1

## 2023-10-24 MED ORDER — DIPHENHYDRAMINE HCL 50 MG/ML IJ SOLN: 12.5000 mg | Freq: Three times a day (TID) | INTRAMUSCULAR | Status: DC | PRN

## 2023-10-24 MED ORDER — DM-GUAIFENESIN ER 30-600 MG PO TB12
1.0000 | ORAL_TABLET | Freq: Two times a day (BID) | ORAL | Status: DC | PRN
  Administered 2023-10-25: 1 via ORAL
  Filled 2023-10-24: qty 1

## 2023-10-24 MED ORDER — SODIUM CHLORIDE 0.9 % IV SOLN
2.0000 g | INTRAVENOUS | Status: DC
  Administered 2023-10-25 – 2023-10-29 (×5): 2 g via INTRAVENOUS
  Filled 2023-10-24 (×6): qty 20

## 2023-10-24 MED ORDER — ACETAMINOPHEN 325 MG PO TABS: 650.0000 mg | ORAL_TABLET | Freq: Four times a day (QID) | ORAL | Status: DC | PRN

## 2023-10-24 MED ORDER — APIXABAN 5 MG PO TABS
5.0000 mg | ORAL_TABLET | Freq: Two times a day (BID) | ORAL | Status: DC
  Administered 2023-10-25 – 2023-11-07 (×25): 5 mg via ORAL
  Filled 2023-10-24 (×25): qty 1

## 2023-10-24 MED ORDER — INSULIN ASPART 100 UNIT/ML IJ SOLN
0.0000 [IU] | Freq: Three times a day (TID) | INTRAMUSCULAR | Status: DC
  Administered 2023-10-26 – 2023-10-28 (×5): 1 [IU] via SUBCUTANEOUS
  Administered 2023-10-28 – 2023-10-29 (×4): 2 [IU] via SUBCUTANEOUS
  Administered 2023-10-30: 1 [IU] via SUBCUTANEOUS
  Administered 2023-10-31: 2 [IU] via SUBCUTANEOUS
  Administered 2023-11-01: 1 [IU] via SUBCUTANEOUS
  Administered 2023-11-01 – 2023-11-02 (×4): 2 [IU] via SUBCUTANEOUS
  Administered 2023-11-02: 3 [IU] via SUBCUTANEOUS
  Administered 2023-11-03: 5 [IU] via SUBCUTANEOUS
  Administered 2023-11-03: 2 [IU] via SUBCUTANEOUS
  Administered 2023-11-03: 1 [IU] via SUBCUTANEOUS
  Administered 2023-11-04: 2 [IU] via SUBCUTANEOUS
  Administered 2023-11-04: 3 [IU] via SUBCUTANEOUS
  Administered 2023-11-04 – 2023-11-05 (×2): 2 [IU] via SUBCUTANEOUS
  Administered 2023-11-06: 7 [IU] via SUBCUTANEOUS
  Administered 2023-11-07 – 2023-11-08 (×4): 2 [IU] via SUBCUTANEOUS
  Filled 2023-10-24 (×29): qty 1

## 2023-10-24 NOTE — ED Provider Notes (Signed)
 [facility] Provider Note    Event Date/Time   First MD Initiated Contact with Patient 10/24/23 1524     (approximate)   History   Respiratory Distress  EM caveat: Acute dyspnea, respiratory failure  HPI  GUYLA BLESS is a 83 y.o. female    Reportedly oxygen saturation of 50 to 60% at home.  Multiple nebulizers given by family prior to EMS arrival.  EMS placed the patient on CPAP and this is shown improvement as well as Nitropaste on the chest.  She is currently saturating about 85 to 90% on CPAP.  Her work of breathing has improved she did require a small dose of midazolam due to anxiety associated with wearing the BiPAP mask  Patient reports feeling of chest tightness.  Also shortness of breath     Physical Exam   Triage Vital Signs: ED Triage Vitals  Encounter Vitals Group     BP 10/24/23 1518 (!) 161/84     Systolic BP Percentile --      Diastolic BP Percentile --      Pulse Rate 10/24/23 1518 (!) 111     Resp 10/24/23 1518 (!) 24     Temp 10/24/23 1518 97.8 F (36.6 C)     Temp Source 10/24/23 1518 Axillary     SpO2 10/24/23 1518 92 %     Weight 10/24/23 1520 200 lb (90.7 kg)     Height 10/24/23 1520 5' 4 (1.626 m)     Head Circumference --      Peak Flow --      Pain Score --      Pain Loc --      Pain Education --      Exclude from Growth Chart --     Most recent vital signs: Vitals:   10/24/23 2202 10/24/23 2300  BP:  (!) 150/95  Pulse: (!) 111 (!) 108  Resp: (!) 31 (!) 24  Temp:    SpO2: 94% 95%     General: Awake, moderate increased work of breathing overall tolerating CPAP well.  Transition to BiPAP in the ER and she is doing very well with this oxygen saturations in the mid 90s respiratory rate improving CV:  Good peripheral perfusion.  Mild tachycardia Resp:  Attempting to say 1 or 2 words over BiPAP.  She has accessory muscle use and tachypnea.  Faint crackles heard in the lower bases bilaterally Abd:  No  distention.  Other:  Moderate bilateral lower extremity pedal edema   ED Results / Procedures / Treatments   Labs (all labs ordered are listed, but only abnormal results are displayed) Labs Reviewed  CBC - Abnormal; Notable for the following components:      Result Value   WBC 24.3 (*)    RBC 3.45 (*)    Hemoglobin 7.6 (*)    HCT 27.5 (*)    MCV 79.7 (*)    MCH 22.0 (*)    MCHC 27.6 (*)    RDW 19.6 (*)    Platelets 142 (*)    nRBC 0.6 (*)    All other components within normal limits  BRAIN NATRIURETIC PEPTIDE - Abnormal; Notable for the following components:   B Natriuretic Peptide 372.4 (*)    All other components within normal limits  COMPREHENSIVE METABOLIC PANEL - Abnormal; Notable for the following components:   Glucose, Bld 123 (*)    BUN 28 (*)    Calcium  8.4 (*)  Albumin 3.0 (*)    All other components within normal limits  LACTIC ACID , PLASMA - Abnormal; Notable for the following components:   Lactic Acid , Venous 2.1 (*)    All other components within normal limits  CBG MONITORING, ED - Abnormal; Notable for the following components:   Glucose-Capillary 102 (*)    All other components within normal limits  TROPONIN I (HIGH SENSITIVITY) - Abnormal; Notable for the following components:   Troponin I (High Sensitivity) 70 (*)    All other components within normal limits  RESP PANEL BY RT-PCR (RSV, FLU A&B, COVID)  RVPGX2  CULTURE, BLOOD (ROUTINE X 2)  CULTURE, BLOOD (ROUTINE X 2)  EXPECTORATED SPUTUM ASSESSMENT W GRAM STAIN, RFLX TO RESP C  RESPIRATORY PANEL BY PCR  LACTIC ACID , PLASMA  PROCALCITONIN  LIPID PANEL  LACTIC ACID , PLASMA  LACTIC ACID , PLASMA  STREP PNEUMONIAE URINARY ANTIGEN  LEGIONELLA PNEUMOPHILA SEROGP 1 UR AG  BASIC METABOLIC PANEL  CBC  HEMOGLOBIN A1C  TROPONIN I (HIGH SENSITIVITY)  TROPONIN I (HIGH SENSITIVITY)   Labs interpreted as very mildly elevated lactic acid .  Severe leukocytosis.  Anemia is also present that appears to  represent a departure from her previous baseline but no history of bleeding  EKG  And interpreted by me at 1520 heart rate 110 QRS 90 QTc 550 Baseline somewhat artifactual, but suspect sinus rhythm.  No frank ischemia did noted.  Artifact is present   RADIOLOGY  DG Chest Portable 1 View Result Date: 10/24/2023 CLINICAL DATA:  Dyspnea EXAM: PORTABLE CHEST 1 VIEW COMPARISON:  Chest x-ray 10/19/2018, report only. FINDINGS: Patient is rotated. The cardiomediastinal silhouette is grossly within normal limits. There are diffuse bilateral airspace opacities throughout the mid and lower lung sparing the lung apices. There is more dense consolidation in the right lower lobe. There is no pleural effusion or pneumothorax. No acute fractures are seen. IMPRESSION: Diffuse bilateral airspace opacities throughout the mid and lower lung sparing the lung apices. There is more dense consolidation in the right lower lobe. Findings are concerning for multifocal pneumonia. Electronically Signed   By: Greig Pique M.D.   On: 10/24/2023 16:54   Chest x-ray interpreted by me is concerning for bilateral airspace opacities   PROCEDURES:  Critical Care performed: Yes, see critical care procedure note(s)  CRITICAL CARE Performed by: Oneil Budge   Total critical care time: 35 minutes  Critical care time was exclusive of separately billable procedures and treating other patients.  Critical care was necessary to treat or prevent imminent or life-threatening deterioration.  Critical care was time spent personally by me on the following activities: development of treatment plan with patient and/or surrogate as well as nursing, discussions with consultants, evaluation of patient's response to treatment, examination of patient, obtaining history from patient or surrogate, ordering and performing treatments and interventions, ordering and review of laboratory studies, ordering and review of radiographic studies, pulse  oximetry and re-evaluation of patient's condition.   Procedures   MEDICATIONS ORDERED IN ED: Medications  gabapentin  (NEURONTIN ) capsule 300 mg (300 mg Oral Given 10/24/23 2115)  hydrOXYzine  (ATARAX ) tablet 25 mg (has no administration in time range)  ipratropium-albuterol  (DUONEB) 0.5-2.5 (3) MG/3ML nebulizer solution 3 mL (3 mLs Nebulization Given 10/24/23 2119)  albuterol  (PROVENTIL ) (2.5 MG/3ML) 0.083% nebulizer solution 2.5 mg (has no administration in time range)  dextromethorphan -guaiFENesin  (MUCINEX  DM) 30-600 MG per 12 hr tablet 1 tablet (has no administration in time range)  diphenhydrAMINE  (BENADRYL ) injection 12.5 mg (has no administration  in time range)  hydrALAZINE  (APRESOLINE ) injection 5 mg (has no administration in time range)  acetaminophen  (TYLENOL ) tablet 650 mg (has no administration in time range)  cefTRIAXone  (ROCEPHIN ) 2 g in sodium chloride  0.9 % 100 mL IVPB (has no administration in time range)  insulin  aspart (novoLOG ) injection 0-9 Units (has no administration in time range)  insulin  aspart (novoLOG ) injection 0-5 Units ( Subcutaneous Not Given 10/24/23 2304)  oxyCODONE -acetaminophen  (PERCOCET/ROXICET) 5-325 MG per tablet 2 tablet (1 tablet Oral Given 10/24/23 2115)  doxycycline  (VIBRAMYCIN ) 100 mg in sodium chloride  0.9 % 250 mL IVPB (100 mg Intravenous New Bag/Given 10/24/23 2128)  cefTRIAXone  (ROCEPHIN ) 2 g in sodium chloride  0.9 % 100 mL IVPB (0 g Intravenous Stopped 10/24/23 1825)  azithromycin  (ZITHROMAX ) 500 mg in sodium chloride  0.9 % 250 mL IVPB (0 mg Intravenous Stopped 10/24/23 1930)     IMPRESSION / MDM / ASSESSMENT AND PLAN / ED COURSE  I reviewed the triage vital signs and the nursing notes.                              Differential diagnosis includes, but is not limited to, volume overload CHF pneumothorax pneumonia influenza viral illness reactive airway disease etc.  She is anticoagulated does have a history of PE but does not have any associated acute  chest pain.  Her symptomatology labs and clinical history seem to be most suggestive of an acute pneumonia with exacerbation of underlying pulmonary disease and possibly CHF   Additional history gathered from the patient's son.  He reports for about 2 weeks now she has had shortness of breath, recently saw pulmonologist [facility] and started torsemide.  She has been compliant with her medications she is also started spironolactone .  This seems to be helping but over the last days she started feeling increasingly short of breath once again when her son checked her oxygen level today when she appeared short of breath her oxygen levels noted to be in the mid 50s  Patient's presentation is most consistent with acute presentation with potential threat to life or bodily function.    The patient is on the cardiac monitor to evaluate for evidence of arrhythmia and/or significant heart rate changes.  Clinical Course as of 10/24/23 2327  Fri Oct 24, 2023  1622 I informed by nursing, delay in obtaining labs.  Patient has vascular access but needs labs drawn, IV team pending [MQ]  1623 Patient currently resting tolerating BiPAP quite well with reassuring hemodynamics.  Her son is also at the bedside.  Patient awake alert oriented at this time work of breathing markedly improved.  Awaiting results of labs renal function etc. [MQ]  1724 Labs interpreted as significant leukocytosis.  Code sepsis has been previously initiated after review of the patient's chest x-ray concerning for possible multifocal pneumonia.  Awaiting further labs at this time including renal function BNP etc.  Also noted is anemia, no report of any obvious acute bleeding. [MQ]    Clinical Course User Index [MQ] Dicky Anes, MD   Very mildly elevated troponin likely in keeping with demand.  BNP is also somewhat elevated.  FINAL CLINICAL IMPRESSION(S) / ED DIAGNOSES   Final diagnoses:  Acute hypoxemic respiratory failure (HCC)   Pneumonia Sepsis   Rx / DC Orders   ED Discharge Orders     None        Note:  This document was prepared using Dragon voice recognition  software and may include unintentional dictation errors.   Dicky Anes, MD 10/24/23 2328

## 2023-10-24 NOTE — Progress Notes (Signed)
 CODE SEPSIS - PHARMACY COMMUNICATION  **Broad Spectrum Antibiotics should be administered within 1 hour of Sepsis diagnosis**  Time Code Sepsis Called/Page Received: 1704  Antibiotics Ordered: Ceftriaxone  & Z-max  Time of 1st antibiotic administration: 1735  Additional action taken by pharmacy: N/A  Marolyn KATHEE Mare 10/24/2023  5:06 PM

## 2023-10-24 NOTE — ED Notes (Signed)
 Patient given a small cup of water.

## 2023-10-24 NOTE — Sepsis Progress Note (Signed)
 Sepsis protocol monitored by eLink ?

## 2023-10-24 NOTE — H&P (Signed)
 History and Physical    Elizabeth Rowland FMW:[id number] DOB: [birth year] DOA: 10/24/2023  Referring MD/NP/PA:   PCP: Donnie Handing, PA   Patient coming from:  The patient is coming from home.     Chief Complaint: SOB  HPI: Elizabeth Rowland is a 83 y.o. female with medical history significant of PE and DVT on Eliquis , HTN, HDL, DM, COPD, dCHf, anxiety, obesity, lymphedema, who presents with shortness of breath.  Per patient and her son at the bedside, she has shortness breath for more than 3 days, which has been progressively worsening.  Patient has cough with little mucus production.  No chest pain, fever or chills.  Per report, patient was found to have severe respiratory distress, with oxygen desaturation to 50% on room air, initially started on CPAP with 85% of saturation, then BiPAP started in ED with improvement.  Patient has nausea, no vomiting, diarrhea or abdominal pain.  No symptoms of UTI.  Data reviewed independently and ED Course: pt was found to have WBC 24.3, lactic acid  1.3 --> 2.1, procalcitonin 0.10, troponin 70, negative PCR for flu, COVID and RSV.  Temperature normal, blood pressure 161/84, heart rate 111, RR 24. chest x-ray showed bilateral diffused effusion.  Patient is admitted to PCU as inpatient.   EKG: I have personally reviewed.  Sinus rhythm, QTc 596, low voltage, RAD, artificial reflux.   Review of Systems:   General: no fevers, chills, no body weight gain,  has fatigue HEENT: no blurry vision, hearing changes or sore throat Respiratory: has dyspnea, coughing, no wheezing CV: no chest pain, no palpitations GI: has nausea, no vomiting, abdominal pain, diarrhea, constipation GU: no dysuria, burning on urination, increased urinary frequency, hematuria  Ext: has leg edema Neuro: no unilateral weakness, numbness, or tingling, no vision change or hearing loss Skin: no rash, no skin tear. MSK: No muscle spasm, no deformity, no limitation of range of  movement in spin Heme: No easy bruising.  Travel history: No recent long distant travel.   Allergy: Not on File  Past Medical History:  Diagnosis Date   Anxiety    Chronic diastolic CHF (congestive heart failure) (HCC)    COPD (chronic obstructive pulmonary disease) (HCC)    Diabetes mellitus without complication (HCC)    DVT (deep venous thrombosis) (HCC)    HLD (hyperlipidemia)    HTN (hypertension)    Pulmonary embolism (HCC)     History reviewed. No pertinent surgical history.  Social History:  reports that she has never smoked. She has never used smokeless tobacco. She reports that she does not currently use alcohol. She reports that she does not use drugs.  Family History:  Family History  Problem Relation Age of Onset   Stroke Mother      Prior to Admission medications   Not on File    Physical Exam: Vitals:   10/24/23 2004 10/24/23 2201 10/24/23 2202 10/24/23 2300  BP:  123/85  (!) 150/95  Pulse:  (!) 112 (!) 111 (!) 108  Resp:  (!) 32 (!) 31 (!) 24  Temp: (!) 97.4 F (36.3 C)     TempSrc: Oral     SpO2:  92% 94% 95%  Weight:      Height:       General: Not in acute distress HEENT:       Eyes: PERRL, EOMI, no jaundice       ENT: No discharge from the ears and nose, no pharynx injection, no tonsillar enlargement.  Neck: Difficult to assess JVD due to obesity., no bruit, no mass felt. Heme: No neck lymph node enlargement. Cardiac: S1/S2, RRR, No murmurs, No gallops or rubs. Respiratory: Has fine crackles bilaterally GI: Soft, nondistended, nontender, no rebound pain, no organomegaly, BS present. GU: No hematuria Ext: Has chronic lymphedema in both legs.  +DP/PT pulse bilaterally. Musculoskeletal: No joint deformities, No joint redness or warmth, no limitation of ROM in spin. Skin: No rashes.  Neuro: Alert, oriented X3, cranial nerves II-XII grossly intact, moves all extremities normally. Psych: Patient is not psychotic, no suicidal or hemocidal  ideation.  Labs on Admission: I have personally reviewed following labs and imaging studies  CBC: Recent Labs  Lab 10/24/23 1640  WBC 24.3*  HGB 7.6*  HCT 27.5*  MCV 79.7*  PLT 142*   Basic Metabolic Panel: Recent Labs  Lab 10/24/23 1640  NA 139  K 3.7  CL 104  CO2 25  GLUCOSE 123*  BUN 28*  CREATININE 0.92  CALCIUM  8.4*   GFR: Estimated Creatinine Clearance: 49.7 mL/min (by C-G formula based on SCr of 0.92 mg/dL). Liver Function Tests: Recent Labs  Lab 10/24/23 1640  AST 19  ALT 12  ALKPHOS 47  BILITOT 1.1  PROT 7.6  ALBUMIN 3.0*   No results for input(s): LIPASE, AMYLASE in the last 168 hours. No results for input(s): AMMONIA in the last 168 hours. Coagulation Profile: No results for input(s): INR, PROTIME in the last 168 hours. Cardiac Enzymes: No results for input(s): CKTOTAL, CKMB, CKMBINDEX, TROPONINI in the last 168 hours. BNP (last 3 results) No results for input(s): PROBNP in the last 8760 hours. HbA1C: No results for input(s): HGBA1C in the last 72 hours. CBG: Recent Labs  Lab 10/24/23 2303  GLUCAP 102*   Lipid Profile: No results for input(s): CHOL, HDL, LDLCALC, TRIG, CHOLHDL, LDLDIRECT in the last 72 hours. Thyroid Function Tests: No results for input(s): TSH, T4TOTAL, FREET4, T3FREE, THYROIDAB in the last 72 hours. Anemia Panel: No results for input(s): VITAMINB12, FOLATE, FERRITIN, TIBC, IRON, RETICCTPCT in the last 72 hours. Urine analysis: No results found for: COLORURINE, APPEARANCEUR, LABSPEC, PHURINE, GLUCOSEU, HGBUR, BILIRUBINUR, KETONESUR, PROTEINUR, UROBILINOGEN, NITRITE, LEUKOCYTESUR Sepsis Labs: @LABRCNTIP (procalcitonin:4,lacticidven:4) ) Recent Results (from the past 240 hours)  Resp panel by RT-PCR (RSV, Flu A&B, Covid) Anterior Nasal Swab     Status: None   Collection Time: 10/24/23  3:33 PM   Specimen: Anterior Nasal Swab  Result Value Ref  Range Status   SARS Coronavirus 2 by RT PCR NEGATIVE NEGATIVE Final    Comment: (NOTE) SARS-CoV-2 target nucleic acids are NOT DETECTED.  The SARS-CoV-2 RNA is generally detectable in upper respiratory specimens during the acute phase of infection. The lowest concentration of SARS-CoV-2 viral copies this assay can detect is 138 copies/mL. A negative result does not preclude SARS-Cov-2 infection and should not be used as the sole basis for treatment or other patient management decisions. A negative result may occur with  improper specimen collection/handling, submission of specimen other than nasopharyngeal swab, presence of viral mutation(s) within the areas targeted by this assay, and inadequate number of viral copies(<138 copies/mL). A negative result must be combined with clinical observations, patient history, and epidemiological information. The expected result is Negative.  Fact Sheet for Patients:  bloggercourse.com  Fact Sheet for Healthcare Providers:  seriousbroker.it  This test is no t yet approved or cleared by the [place]  FDA and  has been authorized for detection and/or diagnosis of SARS-CoV-2 by FDA under an  Emergency Use Authorization (EUA). This EUA will remain  in effect (meaning this test can be used) for the duration of the COVID-19 declaration under Section 564(b)(1) of the Act, 21 U.S.C.section 360bbb-3(b)(1), unless the authorization is terminated  or revoked sooner.       Influenza A by PCR NEGATIVE NEGATIVE Final   Influenza B by PCR NEGATIVE NEGATIVE Final    Comment: (NOTE) The Xpert Xpress SARS-CoV-2/FLU/RSV plus assay is intended as an aid in the diagnosis of influenza from Nasopharyngeal swab specimens and should not be used as a sole basis for treatment. Nasal washings and aspirates are unacceptable for Xpert Xpress SARS-CoV-2/FLU/RSV testing.  Fact Sheet for  Patients: bloggercourse.com  Fact Sheet for Healthcare Providers: seriousbroker.it  This test is not yet approved or cleared by the [place]  FDA and has been authorized for detection and/or diagnosis of SARS-CoV-2 by FDA under an Emergency Use Authorization (EUA). This EUA will remain in effect (meaning this test can be used) for the duration of the COVID-19 declaration under Section 564(b)(1) of the Act, 21 U.S.C. section 360bbb-3(b)(1), unless the authorization is terminated or revoked.     Resp Syncytial Virus by PCR NEGATIVE NEGATIVE Final    Comment: (NOTE) Fact Sheet for Patients: bloggercourse.com  Fact Sheet for Healthcare Providers: seriousbroker.it  This test is not yet approved or cleared by the [place]  FDA and has been authorized for detection and/or diagnosis of SARS-CoV-2 by FDA under an Emergency Use Authorization (EUA). This EUA will remain in effect (meaning this test can be used) for the duration of the COVID-19 declaration under Section 564(b)(1) of the Act, 21 U.S.C. section 360bbb-3(b)(1), unless the authorization is terminated or revoked.  Performed at [facility], [address] [place], [place] [address]      Radiological Exams on Admission:   Assessment/Plan Principal Problem:   Multifocal pneumonia Active Problems:   Acute on chronic respiratory failure with hypoxia (HCC)   COPD (chronic obstructive pulmonary disease) (HCC)   HLD (hyperlipidemia)   Diabetes mellitus without complication (HCC)   Chronic diastolic CHF (congestive heart failure) (HCC)   Pulmonary embolism (HCC)   DVT (deep venous thrombosis) (HCC)   Myocardial injury   Prolonged QT interval   Anxiety   Obesity (BMI 30-39.9)   Assessment and Plan:   Acute on chronic respiratory failure with hypoxia due to multifocal pneumonia: Chest x-ray shows  diffuse bilateral infiltration.  Patient has WBC 24.3, elevated lactic acid , 0.3 --> 2.1, but no fever.  Procalcitonin is less than 0.10.  Clinically does not seem to have sepsis.  Elevated lactic acid  may be due to hypoxia.  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   - Admit to PCU - try to wean off BiPAP - IV Rocephin  and doxycycline  (patient received 1 dose of azithromycin  in ED, due to prolonged QTc, I switched to doxycycline ). - Incentive spirometry - Mucinex  for cough  - Bronchodilators - Urine legionella and S. pneumococcal antigen - Follow up blood culture x2, sputum culture - Check respiratory virus panel -Will not give IV fluid due to elevated BNP  COPD (chronic obstructive pulmonary disease) (HCC): -Bronchodilators as as above  HLD (hyperlipidemia) -Pravastatin   Diabetes mellitus without complication (HCC): Recent A1c 8.1, poorly controlled.  Patient taking NovoLog  and Lantus  20 units daily -Lantus  10 units daily -Sliding scale insulin   Chronic diastolic CHF (congestive heart failure) (HCC): 2D echo on 01/08/2021 showed EF  of 55 to 60% with grade 1 diastolic dysfunction.  Patient has had BMP 372, and chronic lateral leg lymphedema, cannot rule out CHF exacerbation. -Started Lasix  40 mg twice daily  Hx of Pulmonary embolism (HCC) and DVT (deep venous thrombosis) (HCC) -Eliquis   Myocardial injury: trop 70, no CP -trend trop -will not give ASA since pt is on Eliquis  -check A1c and FLP  Prolonged QT interval -Switch home tramadol  to as needed  Anxiety -As needed hydroxyzine   Obesity (BMI 30-39.9): Body weight 90.7 kg, BMI 34.33 -Encouraged losing weight -Exercise and healthy diet.      DVT ppx: on Eliquis   Code Status: Full code     Family Communication:     Yes, patient's son by phone     Disposition Plan:  Anticipate discharge back to previous environment  Consults  called:  none  Admission status and Level of care: Progressive:  as inpt        Dispo: The patient is from: Home              Anticipated d/c is to: Home              Anticipated d/c date is: 2 days              Patient currently is not medically stable to d/c.    Severity of Illness:  The appropriate patient status for this patient is INPATIENT. Inpatient status is judged to be reasonable and necessary in order to provide the required intensity of service to ensure the patient's safety. The patient's presenting symptoms, physical exam findings, and initial radiographic and laboratory data in the context of their chronic comorbidities is felt to place them at high risk for further clinical deterioration. Furthermore, it is not anticipated that the patient will be medically stable for discharge from the hospital within 2 midnights of admission.   * I certify that at the point of admission it is my clinical judgment that the patient will require inpatient hospital care spanning beyond 2 midnights from the point of admission due to high intensity of service, high risk for further deterioration and high frequency of surveillance required.*       Date of Service 10/25/2023    Caleb Exon [facility]   If 7PM-7AM, please contact night-coverage [email or web address] 10/25/2023, 12:06 AM

## 2023-10-24 NOTE — Sepsis Progress Note (Signed)
 Notified provider of need to order repeat lactic acid.

## 2023-10-24 NOTE — ED Triage Notes (Signed)
 Patient comes in from home via [facility] after having sob for 2-3 days. When EMS arrived at her home her o2 saturation was 50% on room air. Pt very panicked by the events and received  0.5 of versed, and 1 inch of nitroglycerin paste. Patient arrives on cpap with saturations ranging from 70-80%. Pt has a history of asthma, pneumonia, and covid per family on scene to EMS. Pt is alert and oriented x4, and currently being placed on BIPAP by respiratory. MD Quale at pt bedside.

## 2023-10-25 DIAGNOSIS — J9601 Acute respiratory failure with hypoxia: Secondary | ICD-10-CM

## 2023-10-25 DIAGNOSIS — A419 Sepsis, unspecified organism: Secondary | ICD-10-CM

## 2023-10-25 DIAGNOSIS — J439 Emphysema, unspecified: Secondary | ICD-10-CM | POA: Diagnosis not present

## 2023-10-25 DIAGNOSIS — J9621 Acute and chronic respiratory failure with hypoxia: Secondary | ICD-10-CM | POA: Diagnosis present

## 2023-10-25 DIAGNOSIS — J189 Pneumonia, unspecified organism: Secondary | ICD-10-CM | POA: Diagnosis not present

## 2023-10-25 LAB — BASIC METABOLIC PANEL
Anion gap: 10 (ref 5–15)
Anion gap: 11 (ref 5–15)
BUN: 25 mg/dL — ABNORMAL HIGH (ref 8–23)
BUN: 23 mg/dL (ref 8–23)
CO2: 29 mmol/L (ref 22–32)
CO2: 30 mmol/L (ref 22–32)
Calcium: 8.1 mg/dL — ABNORMAL LOW (ref 8.9–10.3)
Calcium: 8.3 mg/dL — ABNORMAL LOW (ref 8.9–10.3)
Chloride: 102 mmol/L (ref 98–111)
Chloride: 101 mmol/L (ref 98–111)
Creatinine, Ser: 0.91 mg/dL (ref 0.44–1.00)
Creatinine, Ser: 0.86 mg/dL (ref 0.44–1.00)
GFR, Estimated: >60 mL/min (ref >60)
GFR, Estimated: >60 mL/min (ref >60)
Glucose, Bld: 110 mg/dL — ABNORMAL HIGH (ref 70–99)
Glucose, Bld: 143 mg/dL — ABNORMAL HIGH (ref 70–99)
Potassium: 3.3 mmol/L — ABNORMAL LOW (ref 3.5–5.1)
Potassium: 3.7 mmol/L (ref 3.5–5.1)
Sodium: 141 mmol/L (ref 135–145)
Sodium: 142 mmol/L (ref 135–145)

## 2023-10-25 LAB — CBC
HCT: 27.4 % — ABNORMAL LOW (ref 36.0–46.0)
Hemoglobin: 7.5 g/dL — ABNORMAL LOW (ref 12.0–15.0)
MCH: 21.4 pg — ABNORMAL LOW (ref 26.0–34.0)
MCHC: 27.4 g/dL — ABNORMAL LOW (ref 30.0–36.0)
MCV: 78.3 fL — ABNORMAL LOW (ref 80.0–100.0)
Platelets: 139 10*3/uL — ABNORMAL LOW (ref 150–400)
RBC: 3.5 MIL/uL — ABNORMAL LOW (ref 3.87–5.11)
RDW: 19.7 % — ABNORMAL HIGH (ref 11.5–15.5)
WBC: 26.1 10*3/uL — ABNORMAL HIGH (ref 4.0–10.5)
nRBC: 0.6 % — ABNORMAL HIGH (ref 0.0–0.2)

## 2023-10-25 LAB — LACTIC ACID, PLASMA
Lactic Acid, Venous: 1.2 mmol/L (ref 0.5–1.9)
Lactic Acid, Venous: 1.8 mmol/L (ref 0.5–1.9)

## 2023-10-25 LAB — GLUCOSE, CAPILLARY: Glucose-Capillary: 137 mg/dL — ABNORMAL HIGH (ref 70–99)

## 2023-10-25 LAB — TROPONIN I (HIGH SENSITIVITY)
Troponin I (High Sensitivity): 75 ng/L — ABNORMAL HIGH (ref <18)
Troponin I (High Sensitivity): 66 ng/L — ABNORMAL HIGH (ref <18)
Troponin I (High Sensitivity): 73 ng/L — ABNORMAL HIGH (ref <18)

## 2023-10-25 LAB — CBG MONITORING, ED
Glucose-Capillary: 101 mg/dL — ABNORMAL HIGH (ref 70–99)
Glucose-Capillary: 99 mg/dL (ref 70–99)
Glucose-Capillary: 124 mg/dL — ABNORMAL HIGH (ref 70–99)
Glucose-Capillary: 104 mg/dL — ABNORMAL HIGH (ref 70–99)

## 2023-10-25 LAB — STREP PNEUMONIAE URINARY ANTIGEN: Strep Pneumo Urinary Antigen: NEGATIVE

## 2023-10-25 LAB — LIPID PANEL
Cholesterol: 78 mg/dL (ref 0–200)
HDL: 23 mg/dL — ABNORMAL LOW (ref >40)
LDL Cholesterol: 42 mg/dL (ref 0–99)
Total CHOL/HDL Ratio: 3.4 {ratio}
Triglycerides: 67 mg/dL (ref <150)
VLDL: 13 mg/dL (ref 0–40)

## 2023-10-25 LAB — HEMOGLOBIN A1C
Hgb A1c MFr Bld: 6.1 % — ABNORMAL HIGH (ref 4.8–5.6)
Mean Plasma Glucose: 128.37 mg/dL

## 2023-10-25 LAB — MAGNESIUM: Magnesium: 1.9 mg/dL (ref 1.7–2.4)

## 2023-10-25 MED ORDER — POTASSIUM CHLORIDE 20 MEQ PO PACK
40.0000 meq | PACK | Freq: Once | ORAL | Status: AC
  Administered 2023-10-25: 40 meq via ORAL
  Filled 2023-10-25: qty 2

## 2023-10-25 MED ORDER — ORAL CARE MOUTH RINSE: 15.0000 mL | OROMUCOSAL | Status: DC | PRN

## 2023-10-25 MED ORDER — ORAL CARE MOUTH RINSE
15.0000 mL | OROMUCOSAL | Status: DC
  Administered 2023-10-25 – 2023-11-05 (×32): 15 mL via OROMUCOSAL

## 2023-10-25 MED ORDER — FUROSEMIDE 10 MG/ML IJ SOLN
40.0000 mg | Freq: Two times a day (BID) | INTRAMUSCULAR | Status: DC
Start: 2023-10-25 — End: 2023-11-02
  Administered 2023-10-25 – 2023-11-02 (×18): 40 mg via INTRAVENOUS
  Filled 2023-10-25 (×19): qty 4

## 2023-10-25 NOTE — ED Notes (Signed)
 RT informed RN breathing treatment is done

## 2023-10-25 NOTE — ED Notes (Signed)
 RT was called to place pt back on Bipap, after pt O2 sats decreases in the 80's. Pt is tachypneic but only labored bon exertion.

## 2023-10-25 NOTE — ED Notes (Signed)
 Pt bed linen, absorbable pads was changed. Mepilex was applied to sacral area. Pure wick also in place.

## 2023-10-25 NOTE — ED Notes (Signed)
 Pt desaturated to 83% while taking PO meds with bipap mask off.

## 2023-10-25 NOTE — ED Notes (Signed)
 Changed pt's sheets, pt incontinent of urine, Pt's oxygen saturation dropped to 86% when RN had to drop the head of bed to turn pt in order to be able to perform perineal care. Pt on BiPap able to recover. Once she sat up satting at 95%

## 2023-10-25 NOTE — ED Notes (Signed)
 Administered pt's medications with apple sauce.

## 2023-10-25 NOTE — ED Notes (Signed)
 Had to give rest of pills with applesauce and pt was placed on 10L HFNC for this time. Pt still desaturated into 60s. Pt back on bipap, 94%.

## 2023-10-25 NOTE — Progress Notes (Signed)
 [facility]  PROGRESS NOTE  Elizabeth Rowland FMW:[id number] DOB: August 18, 1941 DOA: 10/24/2023 PCP: Donnie Handing, PA   Brief HPI:   83 y.o. female with medical history significant of PE and DVT on Eliquis , HTN, HDL, DM, COPD, dCHf, anxiety, obesity, lymphedema, who presents with shortness of breath.   Per report, patient was found to have severe respiratory distress, with oxygen desaturation to 50% on room air, initially started on CPAP with 85% of saturation, then BiPAP started in ED with improvement.    Assessment/Plan:   Acute on chronic respiratory failure with hypoxia due to multifocal pneumonia: Chest x-ray shows diffuse bilateral infiltrates.  Patient has WBC 24.3, elevated lactic acid , 0.3 --> 2.1, but no fever.  Procalcitonin is less than 0.10.  Clinically does not seem to have sepsis.  Elevated lactic acid  may be due to hypoxia.  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 -Continue BiPAP, wean off BiPAP as tolerated -Started on Rocephin  and doxycycline  -Continue bronchodilators as needed, -Follow urine for Legionella as well as strep pneumococcal antigen -Follow blood cultures x 2 -Follow respiratory virus panel     COPD (chronic obstructive pulmonary disease) (HCC): -Bronchodilators as as above   HLD (hyperlipidemia) -Pravastatin    Diabetes mellitus without complication (HCC): Recent A1c 8.1, poorly controlled.  - Patient taking NovoLog  and Lantus  20 units daily -Started on Lantus  1 unit subcu daily -Sliding scale insulin  with NovoLog  -CBG well-controlled   Chronic diastolic CHF (congestive heart failure) (HCC): 2D echo on 01/08/2021 showed EF of 55 to 60% with grade 1 diastolic dysfunction.  Patient has had BMP 372, and chronic lateral leg lymphedema, cannot rule out CHF exacerbation. -Started Lasix  40 mg IV twice daily -Follow BMP in am   Hx of Pulmonary embolism (HCC) and DVT  (deep venous thrombosis) (HCC) -Eliquis    Myocardial injury: trop 70, no CP -Troponin is trending down, 75, 66 -will not give ASA since pt is on Eliquis     Prolonged QT interval -Switch home tramadol  to as needed   Anxiety -As needed hydroxyzine    Obesity (BMI 30-39.9): Body weight 90.7 kg, BMI 34.33 -Encouraged losing weight -Exercise and healthy diet.    Medications     apixaban   5 mg Oral BID   furosemide   40 mg Intravenous Q12H   gabapentin   300 mg Oral TID   insulin  aspart  0-5 Units Subcutaneous QHS   insulin  aspart  0-9 Units Subcutaneous TID WC   insulin  glargine-yfgn  10 Units Subcutaneous QHS   ipratropium-albuterol   3 mL Nebulization Q4H   pravastatin   40 mg Oral q1800     Data Reviewed:   CBG:  Recent Labs  Lab 10/24/23 2303 10/25/23 0146 10/25/23 0734  GLUCAP 102* 101* 99    SpO2: 95 % O2 Flow Rate (L/min): (S) 10 L/min FiO2 (%): 60 %    Vitals:   10/25/23 0600 10/25/23 0605 10/25/23 0615 10/25/23 0807  BP:  129/67    Pulse: (!) 106 (!) 104 (!) 103   Resp: 18 19 20    Temp:    97.9 F (36.6 C)  TempSrc:    Axillary  SpO2: 96% 95% 95%   Weight:      Height:          Data Reviewed:  Basic Metabolic Panel: Recent Labs  Lab 10/24/23 1640 10/25/23 0555  NA 139 141  K 3.7 3.3*  CL 104 102  CO2 25 29  GLUCOSE 123* 110*  BUN 28* 25*  CREATININE 0.92 0.91  CALCIUM  8.4* 8.1*  MG  --  1.9    CBC: Recent Labs  Lab 10/24/23 1640 10/25/23 0555  WBC 24.3* 26.1*  HGB 7.6* 7.5*  HCT 27.5* 27.4*  MCV 79.7* 78.3*  PLT 142* 139*    LFT Recent Labs  Lab 10/24/23 1640  AST 19  ALT 12  ALKPHOS 47  BILITOT 1.1  PROT 7.6  ALBUMIN 3.0*     Antibiotics: Anti-infectives (From admission, onward)    Start     Dose/Rate Route Frequency Ordered Stop   10/25/23 1700  cefTRIAXone  (ROCEPHIN ) 2 g in sodium chloride  0.9 % 100 mL IVPB        2 g 200 mL/hr over 30 Minutes Intravenous Every 24 hours 10/24/23 1953     10/25/23 1700   azithromycin  (ZITHROMAX ) 500 mg in sodium chloride  0.9 % 250 mL IVPB  Status:  Discontinued        500 mg 250 mL/hr over 60 Minutes Intravenous Every 24 hours 10/24/23 1953 10/24/23 1959   10/24/23 2000  doxycycline  (VIBRAMYCIN ) 100 mg in sodium chloride  0.9 % 250 mL IVPB        100 mg 125 mL/hr over 120 Minutes Intravenous Every 12 hours 10/24/23 1959     10/24/23 1715  cefTRIAXone  (ROCEPHIN ) 2 g in sodium chloride  0.9 % 100 mL IVPB        2 g 200 mL/hr over 30 Minutes Intravenous Once 10/24/23 1704 10/24/23 1825   10/24/23 1715  azithromycin  (ZITHROMAX ) 500 mg in sodium chloride  0.9 % 250 mL IVPB        500 mg 250 mL/hr over 60 Minutes Intravenous  Once 10/24/23 1704 10/24/23 1930        DVT prophylaxis: Apixaban   Code Status: Full code  Family Communication: No family at bedside   CONSULTS    Subjective   Patient seen, currently on BiPAP.  No new complaints.   Objective    Physical Examination:   General-appears in no acute distress Heart-S1-S2, regular, no murmur auscultated Lungs-decreased breath sound bilaterally Abdomen-soft, nontender, no organomegaly Extremities-1+ edema  in the lower extremities Neuro-alert, oriented x3, no focal deficit noted   Status is: Inpatient:             Dove Gresham S Ersie Savino   [facility] If 7PM-7AM, please contact night-coverage at [email or web address], Office  [phone]   10/25/2023, 9:07 AM  LOS: 1 day

## 2023-10-26 DIAGNOSIS — J439 Emphysema, unspecified: Secondary | ICD-10-CM | POA: Diagnosis not present

## 2023-10-26 DIAGNOSIS — J189 Pneumonia, unspecified organism: Secondary | ICD-10-CM | POA: Diagnosis not present

## 2023-10-26 DIAGNOSIS — E785 Hyperlipidemia, unspecified: Secondary | ICD-10-CM | POA: Diagnosis not present

## 2023-10-26 DIAGNOSIS — J9621 Acute and chronic respiratory failure with hypoxia: Secondary | ICD-10-CM | POA: Diagnosis not present

## 2023-10-26 LAB — GLUCOSE, CAPILLARY
Glucose-Capillary: 108 mg/dL — ABNORMAL HIGH (ref 70–99)
Glucose-Capillary: 148 mg/dL — ABNORMAL HIGH (ref 70–99)
Glucose-Capillary: 118 mg/dL — ABNORMAL HIGH (ref 70–99)
Glucose-Capillary: 174 mg/dL — ABNORMAL HIGH (ref 70–99)

## 2023-10-26 LAB — RESPIRATORY PANEL BY PCR

## 2023-10-26 LAB — BASIC METABOLIC PANEL
Anion gap: 16 — ABNORMAL HIGH (ref 5–15)
BUN: 21 mg/dL (ref 8–23)
CO2: 30 mmol/L (ref 22–32)
Calcium: 8.3 mg/dL — ABNORMAL LOW (ref 8.9–10.3)
Chloride: 94 mmol/L — ABNORMAL LOW (ref 98–111)
Creatinine, Ser: 0.9 mg/dL (ref 0.44–1.00)
GFR, Estimated: >60 mL/min (ref >60)
Glucose, Bld: 151 mg/dL — ABNORMAL HIGH (ref 70–99)
Potassium: 2.9 mmol/L — ABNORMAL LOW (ref 3.5–5.1)
Sodium: 140 mmol/L (ref 135–145)

## 2023-10-26 LAB — CBC
HCT: 26.9 % — ABNORMAL LOW (ref 36.0–46.0)
Hemoglobin: 7.4 g/dL — ABNORMAL LOW (ref 12.0–15.0)
MCH: 22 pg — ABNORMAL LOW (ref 26.0–34.0)
MCHC: 27.5 g/dL — ABNORMAL LOW (ref 30.0–36.0)
MCV: 80.1 fL (ref 80.0–100.0)
Platelets: 124 10*3/uL — ABNORMAL LOW (ref 150–400)
RBC: 3.36 MIL/uL — ABNORMAL LOW (ref 3.87–5.11)
RDW: 19.5 % — ABNORMAL HIGH (ref 11.5–15.5)
WBC: 20.2 10*3/uL — ABNORMAL HIGH (ref 4.0–10.5)
nRBC: 0.4 % — ABNORMAL HIGH (ref 0.0–0.2)

## 2023-10-26 MED ORDER — POTASSIUM CHLORIDE 20 MEQ PO PACK: 40.0000 meq | PACK | ORAL | Status: DC

## 2023-10-26 MED ORDER — POTASSIUM CHLORIDE CRYS ER 20 MEQ PO TBCR
40.0000 meq | EXTENDED_RELEASE_TABLET | ORAL | Status: AC
  Administered 2023-10-26 (×3): 40 meq via ORAL
  Filled 2023-10-26 (×3): qty 2

## 2023-10-26 NOTE — Progress Notes (Signed)
 [facility]  PROGRESS NOTE  KANDICE SCHMELTER FMW:[id number] DOB: Jun 07, 1941 DOA: 10/24/2023 PCP: Donnie Handing, PA   Brief HPI:   83 y.o. female with medical history significant of PE and DVT on Eliquis , HTN, HDL, DM, COPD, dCHf, anxiety, obesity, lymphedema, who presents with shortness of breath.   Per report, patient was found to have severe respiratory distress, with oxygen desaturation to 50% on room air, initially started on CPAP with 85% of saturation, then BiPAP started in ED with improvement.    Assessment/Plan:   Acute on chronic respiratory failure with hypoxia due to multifocal pneumonia: Chest x-ray shows diffuse bilateral infiltrates.  Patient has WBC 24.3, elevated lactic acid , 0.3 --> 2.1, but no fever.  Procalcitonin is less than 0.10.  Clinically does not seem to have sepsis.  Elevated lactic acid  may be due to hypoxia.  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 -Continue BiPAP, wean off BiPAP as tolerated -Started on Rocephin  and doxycycline  -WBC is down to 20,000 -Continue bronchodilators as needed, -Follow urine for Legionella as well as strep pneumococcal antigen -Follow blood cultures x 2 -Follow respiratory virus panel    COPD (chronic obstructive pulmonary disease) (HCC): -Bronchodilators as as above   HLD (hyperlipidemia) -Pravastatin    Diabetes mellitus without complication (HCC): Recent A1c 8.1, poorly controlled.  - Patient taking NovoLog  and Lantus  20 units daily -Started on Lantus  1 unit subcu daily -Sliding scale insulin  with NovoLog  -CBG well-controlled   Chronic diastolic CHF (congestive heart failure) (HCC): 2D echo on 01/08/2021 showed EF of 55 to 60% with grade 1 diastolic dysfunction.  Patient has had BMP 372, and chronic lateral leg lymphedema, cannot rule out CHF exacerbation. 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    Medications     apixaban   5 mg Oral BID   furosemide   40 mg Intravenous Q12H   gabapentin   300 mg Oral TID   insulin  aspart  0-5 Units Subcutaneous QHS   insulin  aspart  0-9 Units Subcutaneous TID WC   insulin  glargine-yfgn  10 Units Subcutaneous QHS   ipratropium-albuterol   3 mL Nebulization Q4H   mouth rinse  15 mL Mouth Rinse 4 times per day   pravastatin   40 mg Oral q1800     Data Reviewed:   CBG:  Recent Labs  Lab 10/25/23 0146 10/25/23 0734 10/25/23 1215 10/25/23 1624 10/25/23 2115  GLUCAP 101* 99 124* 104* 137*    SpO2: 95 % O2 Flow Rate (L/min): 10 L/min FiO2 (%): 60 %    Vitals:   10/26/23 0002 10/26/23 0339 10/26/23 0500 10/26/23 0725  BP: (!) 114/52 122/73  135/61  Pulse: 97 (!) 102  (!) 104  Resp: 19 19  17   Temp: 98.6 F (37 C) 98.5 F (36.9 C)  98.3 F (36.8 C)  TempSrc: Oral Oral  Axillary  SpO2: 96% 97%  95%  Weight:   90.6 kg   Height:          Data  Reviewed:  Basic Metabolic Panel: Recent Labs  Lab 10/24/23 1640 10/25/23 0555 10/25/23 1942  NA 139 141 142  K 3.7 3.3* 3.7  CL 104 102 101  CO2 25 29 30   GLUCOSE 123* 110* 143*  BUN 28* 25* 23  CREATININE 0.92 0.91 0.86  CALCIUM  8.4* 8.1* 8.3*  MG  --  1.9  --     CBC: Recent Labs  Lab 10/24/23 1640 10/25/23 0555 10/26/23 0323  WBC 24.3* 26.1* 20.2*  HGB 7.6* 7.5* 7.4*  HCT 27.5* 27.4* 26.9*  MCV 79.7* 78.3* 80.1  PLT 142* 139* 124*    LFT Recent Labs  Lab 10/24/23 1640  AST 19  ALT 12  ALKPHOS 47  BILITOT 1.1  PROT 7.6  ALBUMIN 3.0*      Antibiotics: Anti-infectives (From admission, onward)    Start     Dose/Rate Route Frequency Ordered Stop   10/25/23 1700  cefTRIAXone  (ROCEPHIN ) 2 g in sodium chloride  0.9 % 100 mL IVPB        2 g 200 mL/hr over 30 Minutes Intravenous Every 24 hours 10/24/23 1953     10/25/23 1700  azithromycin  (ZITHROMAX ) 500 mg in sodium chloride  0.9 % 250 mL IVPB  Status:  Discontinued        500 mg 250 mL/hr over 60 Minutes Intravenous Every 24 hours 10/24/23 1953 10/24/23 1959   10/24/23 2000  doxycycline  (VIBRAMYCIN ) 100 mg in sodium chloride  0.9 % 250 mL IVPB        100 mg 125 mL/hr over 120 Minutes Intravenous Every 12 hours 10/24/23 1959     10/24/23 1715  cefTRIAXone  (ROCEPHIN ) 2 g in sodium chloride  0.9 % 100 mL IVPB        2 g 200 mL/hr over 30 Minutes Intravenous Once 10/24/23 1704 10/24/23 1825   10/24/23 1715  azithromycin  (ZITHROMAX ) 500 mg in sodium chloride  0.9 % 250 mL IVPB        500 mg 250 mL/hr over 60 Minutes Intravenous  Once 10/24/23 1704 10/24/23 1930        DVT prophylaxis: Apixaban   Code Status: Full code  Family Communication: No family at bedside   CONSULTS    Subjective   Breathing has improved.  Currently off BiPAP.   Objective    Physical Examination:   General-appears in no acute distress Heart-S1-S2, regular, no murmur auscultated Lungs-decreased breath sounds at lung bases Abdomen-soft, nontender, no organomegaly Extremities-bilateral 1+  edema in the lower extremities Neuro-alert, oriented x3, no focal deficit noted   Status is: Inpatient:             Sabas GORMAN Brod   [facility] If 7PM-7AM, please contact night-coverage at [email or web address], Office  [phone]   10/26/2023, 8:12 AM  LOS: 2 days

## 2023-10-27 DIAGNOSIS — J189 Pneumonia, unspecified organism: Secondary | ICD-10-CM | POA: Diagnosis not present

## 2023-10-27 LAB — COMPREHENSIVE METABOLIC PANEL
ALT: 11 U/L (ref 0–44)
AST: 18 U/L (ref 15–41)
Albumin: 2.5 g/dL — ABNORMAL LOW (ref 3.5–5.0)
Alkaline Phosphatase: 48 U/L (ref 38–126)
Anion gap: 9 (ref 5–15)
BUN: 21 mg/dL (ref 8–23)
CO2: 32 mmol/L (ref 22–32)
Calcium: 7.9 mg/dL — ABNORMAL LOW (ref 8.9–10.3)
Chloride: 99 mmol/L (ref 98–111)
Creatinine, Ser: 0.96 mg/dL (ref 0.44–1.00)
GFR, Estimated: 58 mL/min — ABNORMAL LOW (ref >60)
Glucose, Bld: 150 mg/dL — ABNORMAL HIGH (ref 70–99)
Potassium: 3.9 mmol/L (ref 3.5–5.1)
Sodium: 140 mmol/L (ref 135–145)
Total Bilirubin: 0.8 mg/dL (ref 0.0–1.2)
Total Protein: 6.8 g/dL (ref 6.5–8.1)

## 2023-10-27 LAB — LEGIONELLA PNEUMOPHILA SEROGP 1 UR AG: L. pneumophila Serogp 1 Ur Ag: NEGATIVE

## 2023-10-27 LAB — CBC
HCT: 26.1 % — ABNORMAL LOW (ref 36.0–46.0)
Hemoglobin: 7.3 g/dL — ABNORMAL LOW (ref 12.0–15.0)
MCH: 21.6 pg — ABNORMAL LOW (ref 26.0–34.0)
MCHC: 28 g/dL — ABNORMAL LOW (ref 30.0–36.0)
MCV: 77.2 fL — ABNORMAL LOW (ref 80.0–100.0)
Platelets: 135 10*3/uL — ABNORMAL LOW (ref 150–400)
RBC: 3.38 MIL/uL — ABNORMAL LOW (ref 3.87–5.11)
RDW: 19.8 % — ABNORMAL HIGH (ref 11.5–15.5)
WBC: 18.9 10*3/uL — ABNORMAL HIGH (ref 4.0–10.5)
nRBC: 1.2 % — ABNORMAL HIGH (ref 0.0–0.2)

## 2023-10-27 LAB — GLUCOSE, CAPILLARY
Glucose-Capillary: 113 mg/dL — ABNORMAL HIGH (ref 70–99)
Glucose-Capillary: 144 mg/dL — ABNORMAL HIGH (ref 70–99)
Glucose-Capillary: 143 mg/dL — ABNORMAL HIGH (ref 70–99)
Glucose-Capillary: 145 mg/dL — ABNORMAL HIGH (ref 70–99)

## 2023-10-27 MED ORDER — POLYETHYLENE GLYCOL 3350 17 G PO PACK
17.0000 g | PACK | Freq: Every day | ORAL | Status: DC
  Administered 2023-10-27 – 2023-11-06 (×7): 17 g via ORAL
  Filled 2023-10-27 (×8): qty 1

## 2023-10-27 MED ORDER — POTASSIUM CHLORIDE CRYS ER 20 MEQ PO TBCR
20.0000 meq | EXTENDED_RELEASE_TABLET | Freq: Once | ORAL | Status: AC
  Administered 2023-10-27: 20 meq via ORAL
  Filled 2023-10-27: qty 1

## 2023-10-27 MED ORDER — MAGNESIUM HYDROXIDE 400 MG/5ML PO SUSP: 30.0000 mL | Freq: Once | ORAL | Status: DC

## 2023-10-27 NOTE — Hospital Course (Addendum)
 Hospital course / significant events:   HPI: 83 y.o. female with medical history significant of PE and DVT on Eliquis, HTN, HDL, DM, COPD, dCHf, anxiety, obesity, lymphedema, who presents with shortness of breath.  Per report, patient was found to have severe respiratory distress, with oxygen desaturation to 50% on room air, initially started on CPAP with 85% of saturation, then BiPAP started in ED with improvement.   02/07: admitted to hospitalist service, needing BiPap support into early 02/09 02/09-02/10: remains on [facility] [place] O2 support  02/11: really not weaning down on high flow, will get CT chest, based on exam suspect may have pleural effusion, eval for PE would be prudent though she has been compliant w/ eliquis she has hx  2/19: Hemodynamically stable, able to wean FiO2 to 85%, remained on 60 L of heated high flow, clinically seems improving and does not want to give up stating that she has been through this multiple times.  Significant leukocytosis at 42.8-patient received very high doses of Solu-Medrol for 3 days, differential and smear review with mild left shift, 1 to 5% matters, polychromasia and target cells, CRP improved.  If leukocytosis continue to get worse we will involve hematology.  Continuing current level of care. 2/20: Patient was placed back to BiPAP due to worsening respiratory status, CRP started increasing after normalizing initially. Patient is very high risk for mortality but wants to keep going and not ready for comfort care yet. 2/21: Patient remained in respiratory distress, barely saturating in mid 80s on BiPAP with maximum setting.  Repeat RVP was negative, Solu-Medrol dose was increased to 125 mg twice daily by pulmonary yesterday.  CRP at 2.7.  Palliative care again met with her but she wants to keep going 2/22: No change in respiratory status, patient apparently pulled her BiPAP mask momentarily and saturation dropped in 40s resulted in a lot of anxiety and dyspnea, she  told nurse that she is tired and wants to be just comfortable now.  She was given a dose of morphine followed by a small dose of Ativan to decrease anxiety.  Lengthy discussion with 2 sons at bedside, apparently she told one of her son yesterday afternoon that she is getting tired.  We have maximized treatment.  Patient has extensive ILD, she was DNR and DNI. She is being transitioned to full comfort care, started on Dilaudid infusion as morphine was not helping her much. Anticipating hospital death.

## 2023-10-27 NOTE — Care Management Important Message (Signed)
 Important Message  Patient Details  Name: Elizabeth Rowland MRN: [id number] Date of Birth: 1941/04/27   Important Message Given:  Yes - Medicare IM     Sherilyn Banker 10/27/2023, 3:27 PM

## 2023-10-27 NOTE — Progress Notes (Signed)
 PROGRESS NOTE    Elizabeth Rowland   KGM:[id number] DOB: [birth year]  DOA: 10/24/2023 Date of Service: 10/27/23 which is hospital day 3  PCP: Shane Crutch, [facility] course / significant events:   HPI: 83 y.o. female with medical history significant of PE and DVT on Eliquis, HTN, HDL, DM, COPD, dCHf, anxiety, obesity, lymphedema, who presents with shortness of breath.  Per report, patient was found to have severe respiratory distress, with oxygen desaturation to 50% on room air, initially started on CPAP with 85% of saturation, then BiPAP started in ED with improvement.   02/07: admitted to hospitalist service, needing BiPap support into early 02/09 02/09-02/10: remains on high flow [place] O2 support     Consultants:  none  Procedures/Surgeries: none      ASSESSMENT & PLAN:   Acute on chronic respiratory failure with hypoxia due to multifocal pneumonia:  Elevated lactic acid more likely due to hypoxia.  Sepsis RULED OUT RVP nothing viral detected Continue supportive O2, wean as tolerated continue on Rocephin and doxycycline Continue bronchodilators as needed, Follow urine for Legionella as well as strep pneumococcal antigen Follow blood cultures x 2    COPD (chronic obstructive pulmonary disease) (HCC): Bronchodilators as as above   HLD (hyperlipidemia) Pravastatin   Diabetes mellitus without complication [facility]):  Recent A1c 8.1, at goal for age  basal insulin + SSI   Chronic diastolic CHF (congestive heart failure) (HCC) 2D echo on 01/08/2021 showed EF of 55 to 60% with grade 1 diastolic dysfunction.  Patient has had BMP 372, and chronic lateral leg lymphedema, cannot rule out CHF exacerbation. Started Lasix 40 mg IV twice daily Follow BMP in am   Hx of Pulmonary embolism (HCC) and DVT (deep venous thrombosis) (HCC) Eliquis   Myocardial injury:  trop 70, no CP,  T trending down, 75, 66 will not give ASA since pt is on Eliquis Repeat troponin / EKG prn  chest pain     Prolonged QT interval Switch home tramadol to as needed   Anxiety As needed hydroxyzine    Class 2 obesity based on BMI: Body mass index is 36.56 kg/m.  Underweight - under 18  overweight - 25 to 29 obese - 30 or more Class 1 obesity: BMI of 30.0 to 34 Class 2 obesity: BMI of 35.0 to 39 Class 3 obesity: BMI of 40.0 to 49 Super Morbid Obesity: BMI 50-59 Super-super Morbid Obesity: BMI 60+ Significantly low or high BMI is associated with higher medical risk.  Weight management advised as adjunct to other disease management and risk reduction treatments    DVT prophylaxis: Eliquis IV fluids: no continuous IV fluids  Nutrition: cardiac/carb diet  Central lines / invasive devices: none  Code Status: FULL CODE ACP documentation reviewed: none on file in [facility]  TOC needs: TBD, PT/OT to eval when stronger  Barriers to dispo / significant pending items: respiratory status             Subjective / Brief ROS:  Patient reports breathing okay today, nose is dry  Denies CP Pain controlled.  Tolerating diet.   Family Communication: none at this time will call later today     Objective Findings:  Vitals:   10/27/23 0839 10/27/23 0930 10/27/23 1211 10/27/23 1231  BP:    (!) 120/52  Pulse:  (!) 107  (!) 110  Resp: (!) 22 20  18   Temp:    98.6 F (37 C)  TempSrc:  SpO2: 93% 92% 92% 92%  Weight:      Height:        Intake/Output Summary (Last 24 hours) at 10/27/2023 1421 Last data filed at 10/27/2023 0504 Gross per 24 hour  Intake 600.02 ml  Output 1100 ml  Net -499.98 ml   Filed Weights   10/24/23 1520 10/26/23 0500 10/27/23 0500  Weight: 90.7 kg 90.6 kg 96.6 kg    Examination:  Physical Exam Constitutional:      General: She is not in acute distress. Cardiovascular:     Rate and Rhythm: Normal rate and regular rhythm.  Pulmonary:     Effort: No respiratory distress.     Comments: Coarse breath sounds throughout Neurological:      Mental Status: [name]. Mental status is at baseline.  Psychiatric:        Mood and Affect: Mood normal.        Behavior: Behavior normal.          Scheduled Medications:   apixaban  5 mg Oral BID   furosemide  40 mg Intravenous Q12H   gabapentin  300 mg Oral TID   insulin aspart  0-5 Units Subcutaneous QHS   insulin aspart  0-9 Units Subcutaneous TID WC   insulin glargine-yfgn  10 Units Subcutaneous QHS   ipratropium-albuterol  3 mL Nebulization Q4H   magnesium hydroxide  30 mL Oral Once   mouth rinse  15 mL Mouth Rinse 4 times per day   polyethylene glycol  17 g Oral Daily   pravastatin  40 mg Oral q1800    Continuous Infusions:  cefTRIAXone (ROCEPHIN)  IV Stopped (10/26/23 1756)   doxycycline (VIBRAMYCIN) IV 100 mg (10/27/23 0848)    PRN Medications:  acetaminophen, albuterol, dextromethorphan-guaiFENesin, diphenhydrAMINE, hydrALAZINE, hydrOXYzine, mouth rinse, oxyCODONE-acetaminophen  Antimicrobials from admission:  Anti-infectives (From admission, onward)    Start     Dose/Rate Route Frequency Ordered Stop   10/25/23 1700  cefTRIAXone (ROCEPHIN) 2 g in sodium chloride 0.9 % 100 mL IVPB        2 g 200 mL/hr over 30 Minutes Intravenous Every 24 hours 10/24/23 1953     10/25/23 1700  azithromycin (ZITHROMAX) 500 mg in sodium chloride 0.9 % 250 mL IVPB  Status:  Discontinued        500 mg 250 mL/hr over 60 Minutes Intravenous Every 24 hours 10/24/23 1953 10/24/23 1959   10/24/23 2000  doxycycline (VIBRAMYCIN) 100 mg in sodium chloride 0.9 % 250 mL IVPB        100 mg 125 mL/hr over 120 Minutes Intravenous Every 12 hours 10/24/23 1959     10/24/23 1715  cefTRIAXone (ROCEPHIN) 2 g in sodium chloride 0.9 % 100 mL IVPB        2 g 200 mL/hr over 30 Minutes Intravenous Once 10/24/23 1704 10/24/23 1825   10/24/23 1715  azithromycin (ZITHROMAX) 500 mg in sodium chloride 0.9 % 250 mL IVPB        500 mg 250 mL/hr over 60 Minutes Intravenous  Once 10/24/23 1704  10/24/23 1930           Data Reviewed:  I have personally reviewed the following...  CBC: Recent Labs  Lab 10/24/23 1640 10/25/23 0555 10/26/23 0323 10/27/23 0344  WBC 24.3* 26.1* 20.2* 18.9*  HGB 7.6* 7.5* 7.4* 7.3*  HCT 27.5* 27.4* 26.9* 26.1*  MCV 79.7* 78.3* 80.1 77.2*  PLT 142* 139* 124* 135*   Basic Metabolic Panel: Recent Labs  Lab  10/24/23 1640 10/25/23 0555 10/25/23 1942 10/26/23 1256 10/27/23 0344  NA 139 141 142 140 140  K 3.7 3.3* 3.7 2.9* 3.9  CL 104 102 101 94* 99  CO2 25 29 30 30  32  GLUCOSE 123* 110* 143* 151* 150*  BUN 28* 25* 23 21 21   CREATININE 0.92 0.91 0.86 0.90 0.96  CALCIUM 8.4* 8.1* 8.3* 8.3* 7.9*  MG  --  1.9  --   --   --    GFR: Estimated Creatinine Clearance: 49.2 mL/min (by C-G formula based on SCr of 0.96 mg/dL). Liver Function Tests: Recent Labs  Lab 10/24/23 1640 10/27/23 0344  AST 19 18  ALT 12 11  ALKPHOS 47 48  BILITOT 1.1 0.8  PROT 7.6 6.8  ALBUMIN 3.0* 2.5*   No results for input(s): "LIPASE", "AMYLASE" in the last 168 hours. No results for input(s): "AMMONIA" in the last 168 hours. Coagulation Profile: No results for input(s): "INR", "PROTIME" in the last 168 hours. Cardiac Enzymes: No results for input(s): "CKTOTAL", "CKMB", "CKMBINDEX", "TROPONINI" in the last 168 hours. BNP (last 3 results) No results for input(s): "PROBNP" in the last 8760 hours. HbA1C: Recent Labs    10/25/23 0018  HGBA1C 6.1*   CBG: Recent Labs  Lab 10/26/23 1205 10/26/23 1719 10/26/23 2040 10/27/23 0806 10/27/23 1232  GLUCAP 148* 118* 174* 113* 144*   Lipid Profile: Recent Labs    10/25/23 0555  CHOL 78  HDL 23*  LDLCALC 42  TRIG 67  CHOLHDL 3.4   Thyroid Function Tests: No results for input(s): "TSH", "T4TOTAL", "FREET4", "T3FREE", "THYROIDAB" in the last 72 hours. Anemia Panel: No results for input(s): "VITAMINB12", "FOLATE", "FERRITIN", "TIBC", "IRON", "RETICCTPCT" in the last 72 hours. Most Recent  Urinalysis On File:  No results found for: "COLORURINE", "APPEARANCEUR", "LABSPEC", "PHURINE", "GLUCOSEU", "HGBUR", "BILIRUBINUR", "KETONESUR", "PROTEINUR", "UROBILINOGEN", "NITRITE", "LEUKOCYTESUR" Sepsis Labs: @LABRCNTIP (procalcitonin:4,lacticidven:4) Microbiology: Recent Results (from the past 240 hours)  Resp panel by RT-PCR (RSV, Flu A&B, Covid) Anterior Nasal Swab     Status: None   Collection Time: 10/24/23  3:33 PM   Specimen: Anterior Nasal Swab  Result Value Ref Range Status   SARS Coronavirus 2 by RT PCR NEGATIVE NEGATIVE Final    Comment: (NOTE) SARS-CoV-2 target nucleic acids are NOT DETECTED.  The SARS-CoV-2 RNA is generally detectable in upper respiratory specimens during the acute phase of infection. The lowest concentration of SARS-CoV-2 viral copies this assay can detect is 138 copies/mL. A negative result does not preclude SARS-Cov-2 infection and should not be used as the sole basis for treatment or other patient management decisions. A negative result may occur with  improper specimen collection/handling, submission of specimen other than nasopharyngeal swab, presence of viral mutation(s) within the areas targeted by this assay, and inadequate number of viral copies(<138 copies/mL). A negative result must be combined with clinical observations, patient history, and epidemiological information. The expected result is Negative.  Fact Sheet for Patients:  BloggerCourse.com  Fact Sheet for Healthcare Providers:  SeriousBroker.it  This test is no t yet approved or cleared by the [place] FDA and  has been authorized for detection and/or diagnosis of SARS-CoV-2 by FDA under an Emergency Use Authorization (EUA). This EUA will remain  in effect (meaning this test can be used) for the duration of the COVID-19 declaration under Section 564(b)(1) of the Act, 21 U.S.C.section 360bbb-3(b)(1), unless the authorization  is terminated  or revoked sooner.       Influenza A by PCR NEGATIVE NEGATIVE Final  Influenza B by PCR NEGATIVE NEGATIVE Final    Comment: (NOTE) The Xpert Xpress SARS-CoV-2/FLU/RSV plus assay is intended as an aid in the diagnosis of influenza from Nasopharyngeal swab specimens and should not be used as a sole basis for treatment. Nasal washings and aspirates are unacceptable for Xpert Xpress SARS-CoV-2/FLU/RSV testing.  Fact Sheet for Patients: BloggerCourse.com  Fact Sheet for Healthcare Providers: SeriousBroker.it  This test is not yet approved or cleared by the [place] FDA and has been authorized for detection and/or diagnosis of SARS-CoV-2 by FDA under an Emergency Use Authorization (EUA). This EUA will remain in effect (meaning this test can be used) for the duration of the COVID-19 declaration under Section 564(b)(1) of the Act, 21 U.S.C. section 360bbb-3(b)(1), unless the authorization is terminated or revoked.     Resp Syncytial Virus by PCR NEGATIVE NEGATIVE Final    Comment: (NOTE) Fact Sheet for Patients: BloggerCourse.com  Fact Sheet for Healthcare Providers: SeriousBroker.it  This test is not yet approved or cleared by the [place] FDA and has been authorized for detection and/or diagnosis of SARS-CoV-2 by FDA under an Emergency Use Authorization (EUA). This EUA will remain in effect (meaning this test can be used) for the duration of the COVID-19 declaration under Section 564(b)(1) of the Act, 21 U.S.C. section 360bbb-3(b)(1), unless the authorization is terminated or revoked.  Performed at [facility], [address] [place], [place] [address]   Blood culture (routine x 2)     Status: None (Preliminary result)   Collection Time: 10/24/23  4:30 PM   Specimen: BLOOD RIGHT ARM  Result Value Ref Range Status   Specimen Description  BLOOD RIGHT ARM  Final   Special Requests   Final    BOTTLES DRAWN AEROBIC AND ANAEROBIC Blood Culture adequate volume   Culture   Final    NO GROWTH 3 DAYS Performed at [facility], [address], [place], [place] [address]    Report Status PENDING  Incomplete  Blood culture (routine x 2)     Status: None (Preliminary result)   Collection Time: 10/25/23  7:45 PM   Specimen: BLOOD RIGHT ARM  Result Value Ref Range Status   Specimen Description BLOOD RIGHT ARM  Final   Special Requests   Final    BOTTLES DRAWN AEROBIC AND ANAEROBIC Blood Culture adequate volume   Culture   Final    NO GROWTH 2 DAYS Performed at [facility], [address], [place], [place] [address]    Report Status PENDING  Incomplete  Respiratory (~20 pathogens) panel by PCR     Status: None   Collection Time: 10/26/23  1:32 PM  Result Value Ref Range Status   Adenovirus NOT DETECTED NOT DETECTED Final   Coronavirus 229E NOT DETECTED NOT DETECTED Final    Comment: (NOTE) The Coronavirus on the Respiratory Panel, DOES NOT test for the novel  Coronavirus (2019 nCoV)    Coronavirus HKU1 NOT DETECTED NOT DETECTED Final   Coronavirus NL63 NOT DETECTED NOT DETECTED Final   Coronavirus OC43 NOT DETECTED NOT DETECTED Final   Metapneumovirus NOT DETECTED NOT DETECTED Final   Rhinovirus / Enterovirus NOT DETECTED NOT DETECTED Final   Influenza A NOT DETECTED NOT DETECTED Final   Influenza B NOT DETECTED NOT DETECTED Final   Parainfluenza Virus 1 NOT DETECTED NOT DETECTED Final   Parainfluenza Virus 2 NOT DETECTED NOT DETECTED Final   Parainfluenza Virus 3 NOT DETECTED NOT DETECTED Final   Parainfluenza Virus  4 NOT DETECTED NOT DETECTED Final   Respiratory Syncytial Virus NOT DETECTED NOT DETECTED Final   Bordetella pertussis NOT DETECTED NOT DETECTED Final   Bordetella Parapertussis NOT DETECTED NOT DETECTED Final   Chlamydophila pneumoniae NOT DETECTED NOT DETECTED Final   Mycoplasma  pneumoniae NOT DETECTED NOT DETECTED Final    Comment: Performed at [facility], [address], [place], [place] [address]      Radiology Studies last 3 days: DG Chest Portable 1 View Result Date: 10/24/2023 CLINICAL DATA:  Dyspnea EXAM: PORTABLE CHEST 1 VIEW COMPARISON:  Chest x-ray 10/19/2018, report only. FINDINGS: Patient is rotated. The cardiomediastinal silhouette is grossly within normal limits. There are diffuse bilateral airspace opacities throughout the mid and lower lung sparing the lung apices. There is more dense consolidation in the right lower lobe. There is no pleural effusion or pneumothorax. No acute fractures are seen. IMPRESSION: Diffuse bilateral airspace opacities throughout the mid and lower lung sparing the lung apices. There is more dense consolidation in the right lower lobe. Findings are concerning for multifocal pneumonia. Electronically Signed   By: Darliss Cheney M.D.   On: 10/24/2023 16:54         Sunnie Nielsen, DO [facility] 10/27/2023, 2:21 PM    Dictation software may have been used to generate the above note. Typos may occur and escape review in typed/dictated notes. Please contact Dr Lyn Hollingshead directly for clarity if needed.  Staff may message me via secure chat in Epic  but this may not receive an immediate response,  please page me for urgent matters!  If 7PM-7AM, please contact night coverage [email or web address]

## 2023-10-28 ENCOUNTER — Inpatient Hospital Stay: Payer: 59

## 2023-10-28 DIAGNOSIS — J189 Pneumonia, unspecified organism: Secondary | ICD-10-CM | POA: Diagnosis not present

## 2023-10-28 LAB — GLUCOSE, CAPILLARY
Glucose-Capillary: 165 mg/dL — ABNORMAL HIGH (ref 70–99)
Glucose-Capillary: 149 mg/dL — ABNORMAL HIGH (ref 70–99)
Glucose-Capillary: 150 mg/dL — ABNORMAL HIGH (ref 70–99)
Glucose-Capillary: 145 mg/dL — ABNORMAL HIGH (ref 70–99)

## 2023-10-28 MED ORDER — IOHEXOL 300 MG/ML  SOLN
75.0000 mL | Freq: Once | INTRAMUSCULAR | Status: AC | PRN
  Administered 2023-10-28: 75 mL via INTRAVENOUS

## 2023-10-28 NOTE — Progress Notes (Signed)
Patient with desaturation now while sleeping. Patient is mouth breather. Refused bipap which is on standby due to nasal pain. Patient with bandaging over nose due to previous bipap usage. O2 increased to 68% 60L. Saturation increased to 92-95%

## 2023-10-28 NOTE — Plan of Care (Signed)

## 2023-10-28 NOTE — Progress Notes (Addendum)
PROGRESS NOTE    Elizabeth Rowland   ZOX:[id number] DOB: 06-Jul-1941  DOA: 10/24/2023 Date of Service: 10/28/23 which is hospital day 4  PCP: Shane Crutch, [facility] course / significant events:   HPI: 83 y.o. female with medical history significant of PE and DVT on Eliquis, HTN, HDL, DM, COPD, dCHf, anxiety, obesity, lymphedema, who presents with shortness of breath.  Per report, patient was found to have severe respiratory distress, with oxygen desaturation to 50% on room air, initially started on CPAP with 85% of saturation, then BiPAP started in ED with improvement.   02/07: admitted to hospitalist service, needing BiPap support into early 02/09 02/09-02/10: remains on [facility] [place] O2 support  02/11: really not weaning down on high flow, will get CT chest, based on exam suspect may have pleural effusion, eval for PE would be prudent though she has been compliant w/ eliquis she has hx   ADDENDUM 10/28/23 5:57 PM reviewed CT chest images, certainly significant pneumonia, no effusion that would benefit from thoracentesis, would consider pulmonary consult tomorrow / pending radiology report    Consultants:  none  Procedures/Surgeries: none      ASSESSMENT & PLAN:   Acute on chronic respiratory failure with hypoxia due to multifocal pneumonia:  Elevated lactic acid more likely due to hypoxia.  Sepsis RULED OUT RVP nothing viral detected Continue supportive O2, wean as tolerated continue on Rocephin and doxycycline Continue bronchodilators as needed, Follow urine for Legionella as well as strep pneumococcal antigen Follow blood cultures x 2 CT chest to further evaluate today - pending this may need to consult pulmonary --> ADDENDUM 10/28/23 5:57 PM reviewed CT chest images, certainly significant pneumonia, no effusion that would benefit from thoracentesis, would consider pulmonary consult tomorrow  / pending radiology report    COPD (chronic obstructive pulmonary  disease) (HCC): Bronchodilators as as above   HLD (hyperlipidemia) Pravastatin   Diabetes mellitus without complication (HCC):  Recent A1c 8.1, at goal for age  basal insulin + SSI   Chronic diastolic CHF (congestive heart failure) (HCC) 2D echo on 01/08/2021 showed EF of 55 to 60% with grade 1 diastolic dysfunction.  Patient has had BMP 372, and chronic lateral leg lymphedema, cannot rule out CHF exacerbation. 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  Underweight - under 18  overweight - 25 to 29 obese - 30 or more Class 1 obesity: BMI of 30.0 to 34 Class 2 obesity: BMI of 35.0 to 39 Class 3 obesity: BMI of 40.0 to 49 Super Morbid Obesity: BMI 50-59 Super-super Morbid Obesity: BMI 60+ Significantly low or high BMI is associated with higher medical risk.  Weight management advised as adjunct to other disease management and risk reduction treatments    DVT prophylaxis: Eliquis IV fluids: no continuous IV fluids  Nutrition: cardiac/carb diet  Central lines / invasive devices: none  Code Status: FULL CODE ACP documentation reviewed: none on file in [facility]  TOC needs: TBD, PT/OT to eval when stronger  Barriers to dispo /  significant pending items: respiratory status         Subjective / Brief ROS:  Patient reports breathing okay today, nose is dry  Denies CP Pain controlled.  Tolerating diet.   Family Communication: none at this time    Objective Findings:  Vitals:   10/28/23 0802 10/28/23 1121 10/28/23 1152 10/28/23 1153  BP: (!) 138/55  126/60   Pulse: 97  99 (!) 103  Resp:  19  20   Temp: 98.8 F (37.1 C)  97.8 F (36.6 C)   TempSrc:      SpO2: 91% 90% (!) 89% 92%  Weight:      Height:        Intake/Output Summary (Last 24 hours) at 10/28/2023 1509 Last data filed at 10/28/2023 1300 Gross per 24 hour  Intake 720.01 ml  Output 1352 ml  Net -631.99 ml   Filed Weights   10/26/23 0500 10/27/23 0500 10/28/23 0609  Weight: 90.6 kg 96.6 kg 97 kg    Examination:  Physical Exam Constitutional:      General: She is not in acute distress. Cardiovascular:     Rate and Rhythm: Normal rate and regular rhythm.  Pulmonary:     Effort: No respiratory distress.     Breath sounds: Examination of the right-middle field reveals decreased breath sounds. Examination of the right-lower field reveals decreased breath sounds. Decreased breath sounds present.     Comments: Coarse breath sounds throughout Neurological:     Mental Status: [name]. Mental status is at baseline.  Psychiatric:        Mood and Affect: Mood normal.        Behavior: Behavior normal.          Scheduled Medications:   apixaban  5 mg Oral BID   furosemide  40 mg Intravenous Q12H   gabapentin  300 mg Oral TID   insulin aspart  0-5 Units Subcutaneous QHS   insulin aspart  0-9 Units Subcutaneous TID WC   insulin glargine-yfgn  10 Units Subcutaneous QHS   ipratropium-albuterol  3 mL Nebulization Q4H   magnesium hydroxide  30 mL Oral Once   mouth rinse  15 mL Mouth Rinse 4 times per day   polyethylene glycol  17 g Oral Daily   pravastatin  40 mg Oral q1800    Continuous Infusions:  cefTRIAXone (ROCEPHIN)  IV Stopped (10/27/23 1816)   doxycycline (VIBRAMYCIN) IV 100 mg (10/28/23 0905)    PRN Medications:  acetaminophen, albuterol, dextromethorphan-guaiFENesin, diphenhydrAMINE, hydrALAZINE, hydrOXYzine, mouth rinse, oxyCODONE-acetaminophen  Antimicrobials from admission:  Anti-infectives (From admission, onward)    Start     Dose/Rate Route Frequency Ordered Stop   10/25/23  1700  cefTRIAXone (ROCEPHIN) 2 g in sodium chloride 0.9 % 100 mL IVPB        2 g 200 mL/hr over 30 Minutes Intravenous Every 24 hours 10/24/23 1953     10/25/23 1700  azithromycin (ZITHROMAX) 500 mg in sodium chloride 0.9 % 250 mL IVPB  Status:  Discontinued        500 mg 250 mL/hr over 60 Minutes Intravenous Every 24 hours 10/24/23 1953 10/24/23 1959   10/24/23 2000  doxycycline (VIBRAMYCIN) 100 mg in sodium chloride 0.9 % 250 mL IVPB        100 mg 125 mL/hr over 120 Minutes Intravenous Every 12 hours 10/24/23 1959     10/24/23 1715  cefTRIAXone (ROCEPHIN) 2 g in sodium chloride 0.9 % 100 mL IVPB  2 g 200 mL/hr over 30 Minutes Intravenous Once 10/24/23 1704 10/24/23 1825   10/24/23 1715  azithromycin (ZITHROMAX) 500 mg in sodium chloride 0.9 % 250 mL IVPB        500 mg 250 mL/hr over 60 Minutes Intravenous  Once 10/24/23 1704 10/24/23 1930           Data Reviewed:  I have personally reviewed the following...  CBC: Recent Labs  Lab 10/24/23 1640 10/25/23 0555 10/26/23 0323 10/27/23 0344  WBC 24.3* 26.1* 20.2* 18.9*  HGB 7.6* 7.5* 7.4* 7.3*  HCT 27.5* 27.4* 26.9* 26.1*  MCV 79.7* 78.3* 80.1 77.2*  PLT 142* 139* 124* 135*   Basic Metabolic Panel: Recent Labs  Lab 10/24/23 1640 10/25/23 0555 10/25/23 1942 10/26/23 1256 10/27/23 0344  NA 139 141 142 140 140  K 3.7 3.3* 3.7 2.9* 3.9  CL 104 102 101 94* 99  CO2 25 29 30 30  32  GLUCOSE 123* 110* 143* 151* 150*  BUN 28* 25* 23 21 21   CREATININE 0.92 0.91 0.86 0.90 0.96  CALCIUM 8.4* 8.1* 8.3* 8.3* 7.9*  MG  --  1.9  --   --   --    GFR: Estimated Creatinine Clearance: 49.3 mL/min (by C-G formula based on SCr of 0.96 mg/dL). Liver Function Tests: Recent Labs  Lab 10/24/23 1640 10/27/23 0344  AST 19 18  ALT 12 11  ALKPHOS 47 48  BILITOT 1.1 0.8  PROT 7.6 6.8  ALBUMIN 3.0* 2.5*   No results for input(s): "LIPASE", "AMYLASE" in the last 168 hours. No results for input(s): "AMMONIA" in the last 168  hours. Coagulation Profile: No results for input(s): "INR", "PROTIME" in the last 168 hours. Cardiac Enzymes: No results for input(s): "CKTOTAL", "CKMB", "CKMBINDEX", "TROPONINI" in the last 168 hours. BNP (last 3 results) No results for input(s): "PROBNP" in the last 8760 hours. HbA1C: No results for input(s): "HGBA1C" in the last 72 hours.  CBG: Recent Labs  Lab 10/27/23 1232 10/27/23 1710 10/27/23 2038 10/28/23 0802 10/28/23 1152  GLUCAP 144* 143* 145* 165* 149*   Lipid Profile: No results for input(s): "CHOL", "HDL", "LDLCALC", "TRIG", "CHOLHDL", "LDLDIRECT" in the last 72 hours.  Thyroid Function Tests: No results for input(s): "TSH", "T4TOTAL", "FREET4", "T3FREE", "THYROIDAB" in the last 72 hours. Anemia Panel: No results for input(s): "VITAMINB12", "FOLATE", "FERRITIN", "TIBC", "IRON", "RETICCTPCT" in the last 72 hours. Most Recent Urinalysis On File:  No results found for: "COLORURINE", "APPEARANCEUR", "LABSPEC", "PHURINE", "GLUCOSEU", "HGBUR", "BILIRUBINUR", "KETONESUR", "PROTEINUR", "UROBILINOGEN", "NITRITE", "LEUKOCYTESUR" Sepsis Labs: @LABRCNTIP (procalcitonin:4,lacticidven:4) Microbiology: Recent Results (from the past 240 hours)  Resp panel by RT-PCR (RSV, Flu A&B, Covid) Anterior Nasal Swab     Status: None   Collection Time: 10/24/23  3:33 PM   Specimen: Anterior Nasal Swab  Result Value Ref Range Status   SARS Coronavirus 2 by RT PCR NEGATIVE NEGATIVE Final    Comment: (NOTE) SARS-CoV-2 target nucleic acids are NOT DETECTED.  The SARS-CoV-2 RNA is generally detectable in upper respiratory specimens during the acute phase of infection. The lowest concentration of SARS-CoV-2 viral copies this assay can detect is 138 copies/mL. A negative result does not preclude SARS-Cov-2 infection and should not be used as the sole basis for treatment or other patient management decisions. A negative result may occur with  improper specimen collection/handling,  submission of specimen other than nasopharyngeal swab, presence of viral mutation(s) within the areas targeted by this assay, and inadequate number of viral copies(<138 copies/mL). A negative result must be  combined with clinical observations, patient history, and epidemiological information. The expected result is Negative.  Fact Sheet for Patients:  BloggerCourse.com  Fact Sheet for Healthcare Providers:  SeriousBroker.it  This test is no t yet approved or cleared by the [place] FDA and  has been authorized for detection and/or diagnosis of SARS-CoV-2 by FDA under an Emergency Use Authorization (EUA). This EUA will remain  in effect (meaning this test can be used) for the duration of the COVID-19 declaration under Section 564(b)(1) of the Act, 21 U.S.C.section 360bbb-3(b)(1), unless the authorization is terminated  or revoked sooner.       Influenza A by PCR NEGATIVE NEGATIVE Final   Influenza B by PCR NEGATIVE NEGATIVE Final    Comment: (NOTE) The Xpert Xpress SARS-CoV-2/FLU/RSV plus assay is intended as an aid in the diagnosis of influenza from Nasopharyngeal swab specimens and should not be used as a sole basis for treatment. Nasal washings and aspirates are unacceptable for Xpert Xpress SARS-CoV-2/FLU/RSV testing.  Fact Sheet for Patients: BloggerCourse.com  Fact Sheet for Healthcare Providers: SeriousBroker.it  This test is not yet approved or cleared by the [place] FDA and has been authorized for detection and/or diagnosis of SARS-CoV-2 by FDA under an Emergency Use Authorization (EUA). This EUA will remain in effect (meaning this test can be used) for the duration of the COVID-19 declaration under Section 564(b)(1) of the Act, 21 U.S.C. section 360bbb-3(b)(1), unless the authorization is terminated or revoked.     Resp Syncytial Virus by PCR NEGATIVE  NEGATIVE Final    Comment: (NOTE) Fact Sheet for Patients: BloggerCourse.com  Fact Sheet for Healthcare Providers: SeriousBroker.it  This test is not yet approved or cleared by the [place] FDA and has been authorized for detection and/or diagnosis of SARS-CoV-2 by FDA under an Emergency Use Authorization (EUA). This EUA will remain in effect (meaning this test can be used) for the duration of the COVID-19 declaration under Section 564(b)(1) of the Act, 21 U.S.C. section 360bbb-3(b)(1), unless the authorization is terminated or revoked.  Performed at [facility], [address] [place], [place] [address]   Blood culture (routine x 2)     Status: None (Preliminary result)   Collection Time: 10/24/23  4:30 PM   Specimen: BLOOD RIGHT ARM  Result Value Ref Range Status   Specimen Description BLOOD RIGHT ARM  Final   Special Requests   Final    BOTTLES DRAWN AEROBIC AND ANAEROBIC Blood Culture adequate volume   Culture   Final    NO GROWTH 4 DAYS Performed at [facility], [address], [place], [place] [address]    Report Status PENDING  Incomplete  Blood culture (routine x 2)     Status: None (Preliminary result)   Collection Time: 10/25/23  7:45 PM   Specimen: BLOOD RIGHT ARM  Result Value Ref Range Status   Specimen Description BLOOD RIGHT ARM  Final   Special Requests   Final    BOTTLES DRAWN AEROBIC AND ANAEROBIC Blood Culture adequate volume   Culture   Final    NO GROWTH 3 DAYS Performed at [facility], [address], [place], [place] [address]    Report Status PENDING  Incomplete  Respiratory (~20 pathogens) panel by PCR     Status: None   Collection Time: 10/26/23  1:32 PM  Result Value Ref Range Status   Adenovirus NOT DETECTED NOT DETECTED Final   Coronavirus 229E NOT DETECTED NOT DETECTED Final  Comment: (NOTE) The Coronavirus on the Respiratory Panel, DOES NOT test for  the novel  Coronavirus (2019 nCoV)    Coronavirus HKU1 NOT DETECTED NOT DETECTED Final   Coronavirus NL63 NOT DETECTED NOT DETECTED Final   Coronavirus OC43 NOT DETECTED NOT DETECTED Final   Metapneumovirus NOT DETECTED NOT DETECTED Final   Rhinovirus / Enterovirus NOT DETECTED NOT DETECTED Final   Influenza A NOT DETECTED NOT DETECTED Final   Influenza B NOT DETECTED NOT DETECTED Final   Parainfluenza Virus 1 NOT DETECTED NOT DETECTED Final   Parainfluenza Virus 2 NOT DETECTED NOT DETECTED Final   Parainfluenza Virus 3 NOT DETECTED NOT DETECTED Final   Parainfluenza Virus 4 NOT DETECTED NOT DETECTED Final   Respiratory Syncytial Virus NOT DETECTED NOT DETECTED Final   Bordetella pertussis NOT DETECTED NOT DETECTED Final   Bordetella Parapertussis NOT DETECTED NOT DETECTED Final   Chlamydophila pneumoniae NOT DETECTED NOT DETECTED Final   Mycoplasma pneumoniae NOT DETECTED NOT DETECTED Final    Comment: Performed at [facility], [address] [place], [place] [address]      Radiology Studies last 3 days: DG Chest Portable 1 View Result Date: 10/24/2023 CLINICAL DATA:  Dyspnea EXAM: PORTABLE CHEST 1 VIEW COMPARISON:  Chest x-ray 10/19/2018, report only. FINDINGS: Patient is rotated. The cardiomediastinal silhouette is grossly within normal limits. There are diffuse bilateral airspace opacities throughout the mid and lower lung sparing the lung apices. There is more dense consolidation in the right lower lobe. There is no pleural effusion or pneumothorax. No acute fractures are seen. IMPRESSION: Diffuse bilateral airspace opacities throughout the mid and lower lung sparing the lung apices. There is more dense consolidation in the right lower lobe. Findings are concerning for multifocal pneumonia. Electronically Signed   By: Darliss Cheney M.D.   On: 10/24/2023 16:54         Sunnie Nielsen, DO [facility] 10/28/2023, 3:09 PM    Dictation software may have been used  to generate the above note. Typos may occur and escape review in typed/dictated notes. Please contact Dr Lyn Hollingshead directly for clarity if needed.  Staff may message me via secure chat in Epic  but this may not receive an immediate response,  please page me for urgent matters!  If 7PM-7AM, please contact night coverage [email or web address]

## 2023-10-29 DIAGNOSIS — J189 Pneumonia, unspecified organism: Secondary | ICD-10-CM | POA: Diagnosis not present

## 2023-10-29 LAB — CBC
HCT: 25.4 % — ABNORMAL LOW (ref 36.0–46.0)
Hemoglobin: 7.1 g/dL — ABNORMAL LOW (ref 12.0–15.0)
MCH: 21.6 pg — ABNORMAL LOW (ref 26.0–34.0)
MCHC: 28 g/dL — ABNORMAL LOW (ref 30.0–36.0)
MCV: 77.2 fL — ABNORMAL LOW (ref 80.0–100.0)
Platelets: 136 10*3/uL — ABNORMAL LOW (ref 150–400)
RBC: 3.29 MIL/uL — ABNORMAL LOW (ref 3.87–5.11)
RDW: 19.9 % — ABNORMAL HIGH (ref 11.5–15.5)
WBC: 23.1 10*3/uL — ABNORMAL HIGH (ref 4.0–10.5)
nRBC: 0.5 % — ABNORMAL HIGH (ref 0.0–0.2)

## 2023-10-29 LAB — GLUCOSE, CAPILLARY
Glucose-Capillary: 163 mg/dL — ABNORMAL HIGH (ref 70–99)
Glucose-Capillary: 151 mg/dL — ABNORMAL HIGH (ref 70–99)
Glucose-Capillary: 150 mg/dL — ABNORMAL HIGH (ref 70–99)
Glucose-Capillary: 134 mg/dL — ABNORMAL HIGH (ref 70–99)

## 2023-10-29 LAB — BASIC METABOLIC PANEL
Anion gap: 9 (ref 5–15)
BUN: 19 mg/dL (ref 8–23)
CO2: 33 mmol/L — ABNORMAL HIGH (ref 22–32)
Calcium: 7.8 mg/dL — ABNORMAL LOW (ref 8.9–10.3)
Chloride: 96 mmol/L — ABNORMAL LOW (ref 98–111)
Creatinine, Ser: 0.9 mg/dL (ref 0.44–1.00)
GFR, Estimated: >60 mL/min (ref >60)
Glucose, Bld: 147 mg/dL — ABNORMAL HIGH (ref 70–99)
Potassium: 3 mmol/L — ABNORMAL LOW (ref 3.5–5.1)
Sodium: 138 mmol/L (ref 135–145)

## 2023-10-29 LAB — CULTURE, BLOOD (ROUTINE X 2)
Culture: NO GROWTH
Special Requests: ADEQUATE

## 2023-10-29 MED ORDER — ACETAMINOPHEN 650 MG RE SUPP
650.0000 mg | RECTAL | Status: DC | PRN
  Administered 2023-10-29: 650 mg via RECTAL
  Filled 2023-10-29: qty 1

## 2023-10-29 MED ORDER — POTASSIUM CHLORIDE CRYS ER 20 MEQ PO TBCR
40.0000 meq | EXTENDED_RELEASE_TABLET | ORAL | Status: AC
  Administered 2023-10-29 (×2): 40 meq via ORAL
  Filled 2023-10-29 (×2): qty 2

## 2023-10-29 MED ORDER — DOXYCYCLINE HYCLATE 100 MG PO TABS
100.0000 mg | ORAL_TABLET | Freq: Two times a day (BID) | ORAL | Status: AC
  Administered 2023-10-29 – 2023-10-30 (×2): 100 mg via ORAL
  Filled 2023-10-29 (×2): qty 1

## 2023-10-29 NOTE — Progress Notes (Signed)
Progress Note   Patient: Elizabeth Rowland:[id number] DOB: [birth year] DOA: 10/24/2023     5 DOS: the patient was seen and examined on 10/29/2023   Brief hospital course:  83 y.o. female with medical history significant of PE and DVT on Eliquis, HTN, HDL, DM, COPD, dCHf, anxiety, obesity, lymphedema, who presents with shortness of breath.  Per report, patient was found to have severe respiratory distress, with oxygen desaturation to 50% on room air, initially started on CPAP with 85% of saturation, then BiPAP started in ED with improvement.    02/07: admitted to hospitalist service, needing BiPap support into early 02/09 02/09-02/10: remains on [facility] [place] O2 support  02/11: really not weaning down on high flow, will get CT chest, based on exam suspect may have pleural effusion, eval for PE would be prudent though she has been compliant w/ eliquis she has hx      ASSESSMENT & PLAN:   Acute on chronic respiratory failure with hypoxia due to multifocal pneumonia:  Elevated lactic acid more likely due to hypoxia.  Sepsis RULED OUT RVP nothing viral detected Continue supportive O2, wean as tolerated Continue on Rocephin and doxycycline Continue bronchodilators as needed, Follow urine for Legionella as well as strep pneumococcal antigen Follow-up on culture results Continue high flow nasal oxygenation I have reviewed chest x-ray showing extensive opacity bilaterally concerning for multifocal pneumonia    COPD (chronic obstructive pulmonary disease) (HCC): Bronchodilators as as above   HLD (hyperlipidemia) Continue pravastatin   Diabetes mellitus without complication [facility]):  Recent A1c 8.1, at goal for age  Continue insulin therapy   Chronic diastolic CHF (congestive heart failure) (HCC) 2D echo on 01/08/2021 showed EF of 55 to 60% with grade 1 diastolic dysfunction.  Patient has had BMP 372, and chronic lateral leg lymphedema, cannot rule out CHF exacerbation. 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  Counseled on weight loss when medically stable   DVT prophylaxis: Eliquis   Code Status: FULL CODE  Barriers to dispo / significant pending items: respiratory status  Subjective  Patient seen and examined at bedside this morning Still on high flow nasal oxygenation at 90% FiO2 Denies nausea vomiting abdominal pain chest pain    Family Communication: none at this time     Physical Exam Constitutional:      General: She is not in acute distress. Cardiovascular:     Rate and Rhythm: Normal rate and regular rhythm.  Pulmonary: Air entry decreased bilaterally Neurological:     Mental Status: [name]. Mental status is at baseline.  Psychiatric:        Mood and Affect: Mood normal.        Behavior: Behavior normal.     Vitals:   10/29/23 0530 10/29/23 0719 10/29/23 1118 10/29/23 1616  BP:      Pulse:    (!) 105  Resp:    (!) 34  Temp:      TempSrc:      SpO2: 91%  90% 91% 93%  Weight:      Height:        Data Reviewed:    Latest Ref Rng & Units 10/29/2023    4:13 AM 10/27/2023    3:44 AM 10/26/2023   12:56 PM  BMP  Glucose 70 - 99 mg/dL 914  782  956   BUN 8 - 23 mg/dL 19  21  21    Creatinine 0.44 - 1.00 mg/dL 2.13  0.86  5.78   Sodium 135 - 145 mmol/L 138  140  140   Potassium 3.5 - 5.1 mmol/L 3.0  3.9  2.9   Chloride 98 - 111 mmol/L 96  99  94   CO2 22 - 32 mmol/L 33  32  30   Calcium 8.9 - 10.3 mg/dL 7.8  7.9  8.3        Latest Ref Rng & Units 10/29/2023    4:13 AM 10/27/2023    3:44 AM 10/26/2023    3:23 AM  CBC  WBC 4.0 - 10.5 K/uL  23.1  18.9  20.2   Hemoglobin 12.0 - 15.0 g/dL 7.1  7.3  7.4   Hematocrit 36.0 - 46.0 % 25.4  26.1  26.9   Platelets 150 - 400 K/uL 136  135  124       Time spent: 45 minutes  Author: Loyce Dys, MD 10/29/2023 4:30 PM  For on call review [email or web address].

## 2023-10-29 NOTE — Progress Notes (Signed)
BIPAP REFUSED.

## 2023-10-30 DIAGNOSIS — J189 Pneumonia, unspecified organism: Secondary | ICD-10-CM | POA: Diagnosis not present

## 2023-10-30 LAB — CBC WITH DIFFERENTIAL/PLATELET
Abs Immature Granulocytes: 1.4 10*3/uL — ABNORMAL HIGH (ref 0.00–0.07)
Basophils Absolute: 0.1 10*3/uL (ref 0.0–0.1)
Basophils Relative: 0 %
Eosinophils Absolute: 0.3 10*3/uL (ref 0.0–0.5)
Eosinophils Relative: 1 %
HCT: 27.5 % — ABNORMAL LOW (ref 36.0–46.0)
Hemoglobin: 7.4 g/dL — ABNORMAL LOW (ref 12.0–15.0)
Immature Granulocytes: 5 %
Lymphocytes Relative: 6 %
Lymphs Abs: 1.6 10*3/uL (ref 0.7–4.0)
MCH: 21.8 pg — ABNORMAL LOW (ref 26.0–34.0)
MCHC: 26.9 g/dL — ABNORMAL LOW (ref 30.0–36.0)
MCV: 80.9 fL (ref 80.0–100.0)
Monocytes Absolute: 9.8 10*3/uL — ABNORMAL HIGH (ref 0.1–1.0)
Monocytes Relative: 35 %
Neutro Abs: 15.2 10*3/uL — ABNORMAL HIGH (ref 1.7–7.7)
Neutrophils Relative %: 53 %
Platelets: 159 10*3/uL (ref 150–400)
RBC: 3.4 MIL/uL — ABNORMAL LOW (ref 3.87–5.11)
RDW: 20.2 % — ABNORMAL HIGH (ref 11.5–15.5)
Smear Review: NORMAL
WBC: 28.3 10*3/uL — ABNORMAL HIGH (ref 4.0–10.5)
nRBC: 0.5 % — ABNORMAL HIGH (ref 0.0–0.2)

## 2023-10-30 LAB — PATHOLOGIST SMEAR REVIEW

## 2023-10-30 LAB — BASIC METABOLIC PANEL
Anion gap: 8 (ref 5–15)
BUN: 21 mg/dL (ref 8–23)
CO2: 35 mmol/L — ABNORMAL HIGH (ref 22–32)
Calcium: 8.4 mg/dL — ABNORMAL LOW (ref 8.9–10.3)
Chloride: 99 mmol/L (ref 98–111)
Creatinine, Ser: 0.93 mg/dL (ref 0.44–1.00)
GFR, Estimated: >60 mL/min (ref >60)
Glucose, Bld: 126 mg/dL — ABNORMAL HIGH (ref 70–99)
Potassium: 3.5 mmol/L (ref 3.5–5.1)
Sodium: 142 mmol/L (ref 135–145)

## 2023-10-30 LAB — CULTURE, BLOOD (ROUTINE X 2)
Culture: NO GROWTH
Special Requests: ADEQUATE

## 2023-10-30 LAB — PREPARE RBC (CROSSMATCH)

## 2023-10-30 LAB — GLUCOSE, CAPILLARY
Glucose-Capillary: 118 mg/dL — ABNORMAL HIGH (ref 70–99)
Glucose-Capillary: 126 mg/dL — ABNORMAL HIGH (ref 70–99)
Glucose-Capillary: 273 mg/dL — ABNORMAL HIGH (ref 70–99)

## 2023-10-30 LAB — ABO/RH: ABO/RH(D): B POS

## 2023-10-30 MED ORDER — SODIUM CHLORIDE 0.9% IV SOLUTION: Freq: Once | INTRAVENOUS | Status: AC

## 2023-10-30 MED ORDER — ALBUTEROL SULFATE (2.5 MG/3ML) 0.083% IN NEBU
2.5000 mg | INHALATION_SOLUTION | Freq: Three times a day (TID) | RESPIRATORY_TRACT | Status: DC
  Administered 2023-10-30 – 2023-11-06 (×21): 2.5 mg via RESPIRATORY_TRACT
  Filled 2023-10-30 (×21): qty 3

## 2023-10-30 MED ORDER — SODIUM CHLORIDE 0.9 % IV SOLN
2.0000 g | Freq: Two times a day (BID) | INTRAVENOUS | Status: DC
  Administered 2023-10-30 – 2023-11-03 (×8): 2 g via INTRAVENOUS
  Filled 2023-10-30 (×10): qty 12.5

## 2023-10-30 MED ORDER — METHYLPREDNISOLONE SODIUM SUCC 125 MG IJ SOLR
125.0000 mg | Freq: Three times a day (TID) | INTRAMUSCULAR | Status: DC
  Filled 2023-10-30: qty 2

## 2023-10-30 MED ORDER — METHYLPREDNISOLONE SODIUM SUCC 125 MG IJ SOLR
80.0000 mg | Freq: Two times a day (BID) | INTRAMUSCULAR | Status: DC
Start: 2023-10-30 — End: 2023-10-31
  Administered 2023-10-30 – 2023-10-31 (×2): 80 mg via INTRAVENOUS
  Filled 2023-10-30: qty 2

## 2023-10-30 NOTE — Progress Notes (Signed)
Progress Note   Patient: Elizabeth Rowland XBJ:[id number] DOB: October 25, 1940 DOA: 10/24/2023     6 DOS: the patient was seen and examined on 10/30/2023    Brief hospital course:  83 y.o. female with medical history significant of PE and DVT on Eliquis, HTN, HDL, DM, COPD, dCHf, anxiety, obesity, lymphedema, who presents with shortness of breath.  Per report, patient was found to have severe respiratory distress, with oxygen desaturation to 50% on room air, initially started on CPAP with 85% of saturation, then BiPAP started in ED with improvement.    02/07: admitted to hospitalist service, needing BiPap support into early 02/09 02/09-02/10: remains on [facility] [place] O2 support  02/11: really not weaning down on high flow, will get CT chest, based on exam suspect may have pleural effusion, eval for PE would be prudent though she has been compliant w/ eliquis she has hx      ASSESSMENT & PLAN:   Acute on chronic respiratory failure with hypoxia due to multifocal pneumonia:  Elevated lactic acid more likely due to hypoxia.  Sepsis RULED OUT RVP nothing viral detected Continue supportive O2, wean as tolerated Ceftriaxone have been switched to cefepime for better coverage Continue doxycycline Continue bronchodilators as needed, Follow urine for Legionella as well as strep pneumococcal antigen Follow-up on culture results Continue high flow nasal oxygenation I have reviewed chest x-ray showing extensive opacity bilaterally concerning for multifocal pneumonia Pulmonologist consulted and case discussed IV Solu-Medrol added for possible concerns of interstitial lung exacerbation     COPD (chronic obstructive pulmonary disease) (HCC): Bronchodilators as as above   HLD (hyperlipidemia) Continue pravastatin   Diabetes mellitus without complication [facility]):  Recent A1c 8.1, at goal for age  Continue insulin therapy   Chronic diastolic CHF (congestive heart failure) (HCC) 2D echo on 01/08/2021 showed  EF of 55 to 60% with grade 1 diastolic dysfunction.  Patient has had BMP 372, and chronic lateral leg lymphedema, cannot rule out CHF exacerbation. 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  Counseled on weight loss when medically stable   DVT prophylaxis: Eliquis   Code Status: DNI DNR   Barriers to dispo / significant pending items: respiratory status   Subjective  Currently requiring 95% FiO2 via high flow nasal cannula Patient had suspected aspiration event this morning while having breakfast Denied chest pain nausea or vomiting Goals of care discussed with patient's family and decision was made for DNI DNR Palliative medicine have been consulted   Family Communication: none at this time      Physical Exam Constitutional:      General: Patient in some respiratory distress on 95% high flow oxygen Cardiovascular:     Rate and Rhythm: Normal rate and regular rhythm.  Pulmonary: Decreased air entry bibasilarly bilaterally Neurological:     Mental Status: [name]. Mental status is at baseline.  Psychiatric:        Mood and Affect: Mood normal.        Behavior:  Behavior normal.       Data reviewed: I have reviewed patient's last imaging and I also showed the imaging to patient's family to help them in making decision    Latest Ref Rng & Units 10/30/2023    6:36 AM 10/29/2023    4:13 AM 10/27/2023    3:44 AM  CBC  WBC 4.0 - 10.5 K/uL 28.3  23.1  18.9   Hemoglobin 12.0 - 15.0 g/dL 7.4  7.1  7.3   Hematocrit 36.0 - 46.0 % 27.5   25.4  26.1   Platelets 150 - 400 K/uL 159  136  135        Latest Ref Rng & Units 10/30/2023    6:36 AM 10/29/2023    4:13 AM 10/27/2023    3:44 AM  BMP  Glucose 70 - 99 mg/dL 161  096  045   BUN 8 - 23 mg/dL 21  19  21    Creatinine 0.44 - 1.00 mg/dL 4.09  8.11  9.14   Sodium 135 - 145 mmol/L 142  138  140   Potassium 3.5 - 5.1 mmol/L 3.5  3.0  3.9   Chloride 98 - 111 mmol/L 99  96  99   CO2 22 - 32 mmol/L 35  33  32   Calcium 8.9 - 10.3 mg/dL 8.4  7.8  7.9     Vitals:   10/30/23 0500 10/30/23 0736 10/30/23 0820 10/30/23 1207  BP:      Pulse:  90  98  Resp:  (!) 22  (!) 25  Temp:      TempSrc:      SpO2:  96% 93% 93%  Weight: 95.6 kg     Height:        I spent a total of 50 minutes of critical care time at bedside this morning managing patient when she desaturated and became cyanotic.  I employed the services of respiratory therapist at bedside as well as discussing the case with pulmonologist over the phone.  All of this time was spent at bedside managing patient and also discussing goals of care with family that came to bedside.  Author: Loyce Dys, MD 10/30/2023 4:29 PM  For on call review [email or web address].

## 2023-10-30 NOTE — Progress Notes (Signed)
   10/29/23 2217  Assess: MEWS Score  Temp (!) 100.9 F (38.3 C)  BP (!) 117/53  MAP (mmHg) 71  Pulse Rate (!) 105  Resp 19  SpO2 95 %  O2 Device Bi-PAP  FiO2 (%) 80 %  Assess: MEWS Score  MEWS Temp 1  MEWS Systolic 0  MEWS Pulse 1  MEWS RR 0  MEWS LOC 0  MEWS Score 2  MEWS Score Color Yellow  Assess: SIRS CRITERIA  SIRS Temperature  0  SIRS Respirations  0  SIRS Pulse 1  SIRS WBC 0  SIRS Score Sum  1   Patient has an elevated temperature. Patient is admitted for Pneumonia and is being treated with bipap.

## 2023-10-30 NOTE — Progress Notes (Signed)
Progress Note   Patient: Elizabeth Rowland VOJ:[id number] DOB: Oct 28, 1940 DOA: 10/24/2023     6 DOS: the patient was seen and examined on 10/30/2023   Brief hospital course:  83 y.o. female with medical history significant of PE and DVT on Eliquis, HTN, HDL, DM, COPD, dCHf, anxiety, obesity, lymphedema, who presents with shortness of breath.  Per report, patient was found to have severe respiratory distress, with oxygen desaturation to 50% on room air, initially started on CPAP with 85% of saturation, then BiPAP started in ED with improvement.    02/07: admitted to hospitalist service, needing BiPap support into early 02/09 02/09-02/10: remains on [facility] [place] O2 support  02/11: Not able to weaning down on high flow, CT chest obtain.      10/30/2023:  Patient seen at bedside with Granddaughter and Son.  She continues to be on high flow oxygen at 91-94% with humdification with biPap at rest and overnight.  Weaning efforts have been unsuccessful.  Pallative care consult placed by hospitalist.        Narrative & Impression  CLINICAL DATA:  Pneumonia, complication suspected, xray done Diffuse/interstitial lung disease   EXAM: CT CHEST WITH CONTRAST   TECHNIQUE: Multidetector CT imaging of the chest was performed during intravenous contrast administration.   RADIATION DOSE REDUCTION: This exam was performed according to the departmental dose-optimization program which includes automated exposure control, adjustment of the mA and/or kV according to patient size and/or use of iterative reconstruction technique.   CONTRAST:  75mL OMNIPAQUE IOHEXOL 300 MG/ML  SOLN   COMPARISON:  X-ray 10/24/2023, CT 11/29/2021   FINDINGS: Cardiovascular: Mild cardiomegaly. Trace pericardial fluid. Lipomatous hypertrophy of the interatrial septum. Thoracic aorta is nonaneurysmal. Atherosclerotic calcifications of the aorta and coronary arteries. Central pulmonary vasculature is mildly dilated.    Mediastinum/Nodes: Multiple enlarged mediastinal and right hilar lymph nodes. Reference nodes include 1.6 cm lower right paratracheal node (series 2, image 49), 1.4 cm AP window node (series 2, image 53), 1.4 cm right hilar node (series 2, image 63). These nodes have all increased in size when compared to the prior CT from 2023. Several small left hilar lymph nodes are present. No axillary lymphadenopathy. 2.3 cm left thyroid lobe nodule. Trachea and esophagus within normal limits. Small hiatal hernia.   Lungs/Pleura: Extensive airspace opacity throughout both lungs involving all lobes, findings are slightly worse within the right lung. Background of interstitial lung disease, not well assessed given the degree of airspace consolidation. No pleural effusion or pneumothorax.   Upper Abdomen: No acute abnormality.   Musculoskeletal: No chest wall abnormality. No acute or significant osseous findings.   IMPRESSION: 1. Extensive airspace opacity throughout both lungs, slightly worse within the right lung. Findings are most suspicious for multifocal pneumonia. 2. Background of interstitial lung disease, not well assessed given the degree of airspace consolidation. Consider follow-up high-resolution chest CT on an outpatient basis following the resolution of patient's acute symptoms. 3. Mediastinal and right hilar lymphadenopathy, increased in size when compared to the prior CT from 2023. Findings are favored to be reactive. 4. Mild cardiomegaly with mild dilation of the central pulmonary vasculature, suggesting pulmonary arterial hypertension. 5. Aortic and coronary artery atherosclerosis (ICD10-I70.0). 6. Incidentally noted 2.3 cm left thyroid lobe nodule. Recommend thyroid US (ref: J Am Coll Radiol. 2015 Feb;12(2): 143-50).     Electronically Signed   By: Duanne Guess D.O.   On: 10/28/2023 19:56       ASSESSMENT & PLAN:    Fluid  overload/Edema:   Currently on Lasix  40mg  Daily  Strict I/O with 1200 cc fluid restriction    Acute on chronic respiratory failure with hypoxia due to multifocal pneumonia:  Elevated lactic acid has resolved  Sepsis RULED OUT RVP 10/26/2023:   negative WBC 28.3 today - elevated from yesterday at 23.1 - continued with elevation but may be effected by current steroids - we will decrease mehtylprednisone to 80 BID H/H 7.4/27.4 CO2 - slightly elevated at 35 Legionella and strep pneumococcal antigen - Negative Blood Cultures Negative 10/30/2023 CT and chest x-ray concerning for multifocal pneumonia Continue supportive O2,  Currently on high flow [place] currently at FiO2 94%  - wean as tolerated - Continue on Rocephin and doxycycline Continue bronchodilators    COPD/ Interstitial Lung Disease: Bronchodilators - Albuterol Neb Q8H with Chest physiotherapy - MedaNeb Methylprednisone IV - decreased to 80mg  BID Aggressive Incentive spirometry use with cough    Anemia with known Cardiac disease  Goal of Hemoglobin > 8 - currently 7.4  transfusion of 1 unit of PRBC per hospital protocol       HLD (hyperlipidemia) Continue pravastatin   Diabetes mellitus without complication (HCC):  Recent A1c 8.1, at goal for age  Continue insulin therapy   Chronic diastolic CHF (congestive heart failure) (HCC) 2D echo on 01/08/2021 showed EF of 55 to 60% with grade 1 diastolic dysfunction.  Patient has had BMP 372, and chronic lateral leg lymphedema, cannot rule out CHF exacerbation. 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  Counseled on weight loss when medically stable    Code Status: FULL CODE -  pallative care consultation request by hospitalist   Barriers to dispo / significant pending items: respiratory status   Family Communication: Family at bedside - Granddaughter and Son at bedside. Discussed Respiratory status and answered all questions of concern    Physical Exam Constitutional:      General: She is not in acute distress. Cardiovascular:     Rate and Rhythm: Normal rate and regular rhythm.  Pulmonary: Air entry decreased bilaterally.  Wheezing in upper lobes and diminished bases.   Extremities:  Mild bilateral lower extremity edema.  Neurological:     Mental Status: [name]. Mental status is at baseline.  Psychiatric:        Mood and Affect: Mood normal.        Behavior: Behavior normal.     Vitals:   10/30/23 0500 10/30/23 0736 10/30/23 0820 10/30/23 1207  BP:      Pulse:  90  98  Resp:  (!) 22  (!) 25  Temp:      TempSrc:      SpO2:  96% 93% 93%  Weight: 95.6 kg     Height:        Data Reviewed:    Latest Ref Rng & Units 10/30/2023    6:36 AM 10/29/2023    4:13 AM 10/27/2023    3:44 AM  BMP  Glucose 70 - 99 mg/dL 595  638  756   BUN 8 - 23 mg/dL 21  19  21   Creatinine 0.44 - 1.00 mg/dL 9.14  7.82  9.56   Sodium 135 - 145 mmol/L 142  138  140   Potassium 3.5 - 5.1 mmol/L 3.5  3.0  3.9   Chloride 98 - 111 mmol/L 99  96  99   CO2 22 - 32 mmol/L 35  33  32   Calcium 8.9 - 10.3 mg/dL 8.4  7.8  7.9        Latest Ref Rng & Units 10/30/2023    6:36 AM 10/29/2023    4:13 AM 10/27/2023    3:44 AM  CBC  WBC 4.0 - 10.5 K/uL 28.3  23.1  18.9   Hemoglobin 12.0 - 15.0 g/dL 7.4  7.1  7.3   Hematocrit 36.0 - 46.0 % 27.5  25.4  26.1   Platelets 150 - 400 K/uL 159  136  135        Lab Results WBC  Date/Time Value Ref Range Status  10/30/2023 06:36 AM 28.3 (H) 4.0 - 10.5 K/uL Final  10/29/2023 04:13 AM 23.1 (H) 4.0 - 10.5 K/uL Final  10/27/2023 03:44 AM 18.9 (H) 4.0 - 10.5 K/uL Final   Neutrophils Relative %  Date/Time Value Ref Range Status   10/30/2023 06:36 AM 53 % Final   No results found for: "PCO2ART" Lactic Acid, Venous  Date/Time Value Ref Range Status  10/25/2023 07:42 PM 1.8 0.5 - 1.9 mmol/L Final    Comment:    Performed at [facility], [address] [place], [place] [address]  10/25/2023 12:18 AM 1.2 0.5 - 1.9 mmol/L Final    Comment:    Performed at [facility], [address] [place], [place] [address]  10/24/2023 06:15 PM 1.3 0.5 - 1.9 mmol/L Final    Comment:    Performed at [facility], [address] [place], [place] [address]   No results found for: "PCO2VEN"   Time spent: 45 minutes  Author: Nelda Marseille, NP 10/30/2023 3:12 PM  For on call review [email or web address].

## 2023-10-30 NOTE — Plan of Care (Signed)
Problem: Education: Goal: Ability to describe self-care measures that may prevent or decrease complications (Diabetes Survival Skills Education) will improve Outcome: Not Progressing   Problem: Coping: Goal: Ability to adjust to condition or change in health will improve Outcome: Not Progressing   Problem: Fluid Volume: Goal: Ability to maintain a balanced intake and output will improve Outcome: Not Progressing

## 2023-10-30 NOTE — Progress Notes (Signed)
Pharmacy Antibiotic Note  Elizabeth Rowland is a 83 y.o. female admitted on 10/24/2023 with  hypoxia 2/2 pneumonia . Patient remains afebrile but WBC count increasing and still unable to wean down supplemental oxygen requirements. Pharmacy has been consulted for cefepime dosing to escalate antibiotic coverage.   Plan: On day 7 of antibiotics Stop ceftriaxone 2 g IV every 24 hours Start cefepime 2 g IV every 12 hours Monitor renal function, clinical status, culture data, and LOT F/u pulmonology consult  Height: 5\' 4"  (162.6 cm) Weight: 95.6 kg (210 lb 12.2 oz) IBW/kg (Calculated) : 54.7  Temp (24hrs), Avg:99.5 F (37.5 C), Min:97.8 F (36.6 C), Max:100.9 F (38.3 C)  Recent Labs  Lab 10/24/23 1639 10/24/23 1640 10/24/23 1815 10/25/23 0018 10/25/23 0555 10/25/23 1942 10/26/23 0323 10/26/23 1256 10/27/23 0344 10/29/23 0413 10/30/23 0636  WBC  --    < >  --   --  26.1*  --  20.2*  --  18.9* 23.1* 28.3*  CREATININE  --    < >  --   --  0.91 0.86  --  0.90 0.96 0.90 0.93  LATICACIDVEN 2.1*  --  1.3 1.2  --  1.8  --   --   --   --   --    < > = values in this interval not displayed.    Estimated Creatinine Clearance: 50.5 mL/min (by C-G formula based on SCr of 0.93 mg/dL).    No Known Allergies  Antimicrobials this admission: azithromycin 2/7 x1 Ceftriaxone 2/7 >> 2/13 Doxycycline 2/7 >> Cefepime 2/13 >>  Dose adjustments this admission: N/A  Microbiology results: 2/8 BCx: NGTD  Thank you for involving pharmacy in this patient's care.   Rockwell Alexandria, PharmD Clinical Pharmacist 10/30/2023 4:34 PM

## 2023-10-31 ENCOUNTER — Inpatient Hospital Stay
Admit: 2023-10-31 | Discharge: 2023-10-31 | Disposition: A | Discharge status: Home / Self Care | Payer: 59 | Attending: Internal Medicine | Admitting: Internal Medicine

## 2023-10-31 DIAGNOSIS — J9601 Acute respiratory failure with hypoxia: Secondary | ICD-10-CM | POA: Diagnosis not present

## 2023-10-31 DIAGNOSIS — J189 Pneumonia, unspecified organism: Secondary | ICD-10-CM | POA: Diagnosis not present

## 2023-10-31 DIAGNOSIS — I5032 Chronic diastolic (congestive) heart failure: Secondary | ICD-10-CM | POA: Diagnosis not present

## 2023-10-31 DIAGNOSIS — Z515 Encounter for palliative care: Secondary | ICD-10-CM | POA: Diagnosis not present

## 2023-10-31 DIAGNOSIS — R0603 Acute respiratory distress: Secondary | ICD-10-CM | POA: Diagnosis not present

## 2023-10-31 LAB — BASIC METABOLIC PANEL
Anion gap: 11 (ref 5–15)
BUN: 27 mg/dL — ABNORMAL HIGH (ref 8–23)
CO2: 30 mmol/L (ref 22–32)
Calcium: 8.3 mg/dL — ABNORMAL LOW (ref 8.9–10.3)
Chloride: 99 mmol/L (ref 98–111)
Creatinine, Ser: 0.86 mg/dL (ref 0.44–1.00)
GFR, Estimated: >60 mL/min (ref >60)
Glucose, Bld: 168 mg/dL — ABNORMAL HIGH (ref 70–99)
Potassium: 3.6 mmol/L (ref 3.5–5.1)
Sodium: 140 mmol/L (ref 135–145)

## 2023-10-31 LAB — GLUCOSE, CAPILLARY
Glucose-Capillary: 153 mg/dL — ABNORMAL HIGH (ref 70–99)
Glucose-Capillary: 184 mg/dL — ABNORMAL HIGH (ref 70–99)
Glucose-Capillary: 170 mg/dL — ABNORMAL HIGH (ref 70–99)

## 2023-10-31 LAB — BLOOD GAS, ARTERIAL
Acid-Base Excess: 10.7 mmol/L — ABNORMAL HIGH (ref 0.0–2.0)
Acid-Base Excess: 10.9 mmol/L — ABNORMAL HIGH (ref 0.0–2.0)
Bicarbonate: 36.8 mmol/L — ABNORMAL HIGH (ref 20.0–28.0)
Bicarbonate: 35.7 mmol/L — ABNORMAL HIGH (ref 20.0–28.0)
Delivery systems: POSITIVE
Delivery systems: POSITIVE
Expiratory PAP: 6 cm[H2O]
Expiratory PAP: 8 cm[H2O]
FIO2: 100 %
FIO2: 100 %
Inspiratory PAP: 8 cm[H2O]
Inspiratory PAP: 10 cm[H2O]
O2 Saturation: 89.2 %
O2 Saturation: 85.8 %
Patient temperature: 37
Patient temperature: 37
pCO2 arterial: 58 mm[Hg] — ABNORMAL HIGH (ref 32–48)
pCO2 arterial: 48 mm[Hg] (ref 32–48)
pH, Arterial: 7.41 (ref 7.35–7.45)
pH, Arterial: 7.48 — ABNORMAL HIGH (ref 7.35–7.45)
pO2, Arterial: 58 mm[Hg] — ABNORMAL LOW (ref 83–108)
pO2, Arterial: 55 mm[Hg] — ABNORMAL LOW (ref 83–108)

## 2023-10-31 LAB — CBC WITH DIFFERENTIAL/PLATELET
Abs Immature Granulocytes: 2.24 10*3/uL — ABNORMAL HIGH (ref 0.00–0.07)
Basophils Absolute: 0.1 10*3/uL (ref 0.0–0.1)
Basophils Relative: 0 %
Eosinophils Absolute: 0.1 10*3/uL (ref 0.0–0.5)
Eosinophils Relative: 0 %
HCT: 29.5 % — ABNORMAL LOW (ref 36.0–46.0)
Hemoglobin: 8.2 g/dL — ABNORMAL LOW (ref 12.0–15.0)
Immature Granulocytes: 8 %
Lymphocytes Relative: 4 %
Lymphs Abs: 1.1 10*3/uL (ref 0.7–4.0)
MCH: 22.3 pg — ABNORMAL LOW (ref 26.0–34.0)
MCHC: 27.8 g/dL — ABNORMAL LOW (ref 30.0–36.0)
MCV: 80.2 fL (ref 80.0–100.0)
Monocytes Absolute: 2.8 10*3/uL — ABNORMAL HIGH (ref 0.1–1.0)
Monocytes Relative: 10 %
Neutro Abs: 22.5 10*3/uL — ABNORMAL HIGH (ref 1.7–7.7)
Neutrophils Relative %: 78 %
Platelets: 184 10*3/uL (ref 150–400)
RBC: 3.68 MIL/uL — ABNORMAL LOW (ref 3.87–5.11)
RDW: 19.5 % — ABNORMAL HIGH (ref 11.5–15.5)
Smear Review: NORMAL
WBC: 28.7 10*3/uL — ABNORMAL HIGH (ref 4.0–10.5)
nRBC: 0.7 % — ABNORMAL HIGH (ref 0.0–0.2)

## 2023-10-31 LAB — TYPE AND SCREEN
ABO/RH(D): B POS
Antibody Screen: NEGATIVE
Unit division: 0

## 2023-10-31 LAB — BPAM RBC
Blood Product Expiration Date: 202503042359
ISSUE DATE / TIME: 202502131826
Unit Type and Rh: 7300

## 2023-10-31 LAB — ECHOCARDIOGRAM COMPLETE
AR max vel: 1.75 cm2
AV Area VTI: 2.08 cm2
AV Area mean vel: 1.64 cm2
AV Mean grad: 13 mm[Hg]
AV Peak grad: 24.3 mm[Hg]
Ao pk vel: 2.47 m/s
Area-P 1/2: 3.65 cm2
Height: 64 in
MV VTI: 3.09 cm2
S' Lateral: 2.9 cm
Weight: 3456.81 [oz_av]

## 2023-10-31 MED ORDER — ALBUTEROL SULFATE (2.5 MG/3ML) 0.083% IN NEBU
2.5000 mg | INHALATION_SOLUTION | RESPIRATORY_TRACT | Status: DC | PRN
  Administered 2023-11-01 – 2023-11-07 (×3): 2.5 mg via RESPIRATORY_TRACT
  Filled 2023-10-31 (×3): qty 3

## 2023-10-31 MED ORDER — SODIUM CHLORIDE 0.9 % IV SOLN
1000.0000 mg | Freq: Every day | INTRAVENOUS | Status: AC
  Administered 2023-10-31 – 2023-11-02 (×3): 1000 mg via INTRAVENOUS
  Filled 2023-10-31 (×3): qty 16

## 2023-10-31 MED ORDER — MORPHINE SULFATE (PF) 2 MG/ML IV SOLN
2.0000 mg | INTRAVENOUS | Status: DC | PRN
  Administered 2023-10-31 – 2023-11-06 (×19): 2 mg via INTRAVENOUS
  Filled 2023-10-31 (×20): qty 1

## 2023-10-31 MED ORDER — LORAZEPAM 2 MG/ML IJ SOLN
0.5000 mg | Freq: Once | INTRAMUSCULAR | Status: AC | PRN
  Administered 2023-10-31: 0.5 mg via INTRAVENOUS
  Filled 2023-10-31: qty 1

## 2023-10-31 MED ORDER — SALINE SPRAY 0.65 % NA SOLN
1.0000 | NASAL | Status: DC | PRN
  Filled 2023-10-31: qty 44

## 2023-10-31 MED ORDER — MORPHINE SULFATE (PF) 2 MG/ML IV SOLN
2.0000 mg | Freq: Once | INTRAVENOUS | Status: AC
  Administered 2023-10-31: 2 mg via INTRAVENOUS
  Filled 2023-10-31: qty 1

## 2023-10-31 MED ORDER — MORPHINE SULFATE (PF) 2 MG/ML IV SOLN
2.0000 mg | INTRAVENOUS | Status: AC | PRN
  Administered 2023-10-31: 2 mg via INTRAVENOUS
  Filled 2023-10-31: qty 1

## 2023-10-31 NOTE — Progress Notes (Signed)
Progress Note   Patient: Elizabeth Rowland:[id number] DOB: [birth year] DOA: 10/24/2023     7 DOS: the patient was seen and examined on 10/31/2023    Brief hospital course:  83 y.o. female with medical history significant of PE and DVT on Eliquis, HTN, HDL, DM, COPD, dCHf, anxiety, obesity, lymphedema, who presents with shortness of breath.  Per report, patient was found to have severe respiratory distress, with oxygen desaturation to 50% on room air, initially started on CPAP with 85% of saturation, then BiPAP started in ED with improvement.    02/07: admitted to hospitalist service, needing BiPap support into early 02/09 02/09-02/10: remains on [facility] [place] O2 support  02/11: really not weaning down on high flow, will get CT chest, based on exam suspect may have pleural effusion, eval for PE would be prudent though she has been compliant w/ eliquis she has hx      ASSESSMENT & PLAN:   Acute on chronic respiratory failure with hypoxia due to multifocal pneumonia:  Elevated lactic acid more likely due to hypoxia.  Sepsis RULED OUT RVP nothing viral detected Continue supportive O2, wean as tolerated Continue cefepime Continue doxycycline Continue bronchodilators as needed, Follow urine for Legionella as well as strep pneumococcal antigen Follow-up on culture results Continue high flow nasal oxygenation I have reviewed chest x-ray showing extensive opacity bilaterally concerning for multifocal pneumonia I discussed with pulmonologist as well as palliative care team at bedside this morning Continue steroid management according to pulmonologist recommendation      COPD (chronic obstructive pulmonary disease) (HCC): Continue bronchodilators as as above   HLD (hyperlipidemia) Continue pravastatin   Diabetes mellitus without complication (HCC):  Recent A1c 8.1, at goal for age  Continue insulin therapy   Chronic diastolic CHF (congestive heart failure) (HCC) 2D echo on 01/08/2021  showed EF of 55 to 60% with grade 1 diastolic dysfunction.  Patient has had BMP 372, and chronic lateral leg lymphedema, cannot rule out CHF exacerbation. Continue IV Lasix Follow-up on echocardiogram requested   Hx of Pulmonary embolism (HCC) and DVT (deep venous thrombosis) (HCC) Continue Eliquis  Myocardial injury:  trop 70, no CP,  T trending down, 75, 66 will not give ASA since pt is on Eliquis We will repeat troponin / EKG prn chest pain     Prolonged QT interval Continue home tramadol to as needed   Anxiety As needed hydroxyzine     Class 2 obesity based on BMI: Body mass index is 36.56 kg/m.  Counseled on weight loss when medically stable   DVT prophylaxis: Eliquis   Code Status: DNI DNR   Barriers to dispo / significant pending items: respiratory status   Subjective  Patient was on 100% oxygen via BiPAP this morning Denies nausea vomiting abdominal pain chest pain Given increased oxygen need the patient is being moved to stepdown unit for closer monitoring  Family Communication: none at this time      Physical Exam Constitutional:      General: Patient in some respiratory distress on 95% high flow oxygen Cardiovascular:     Rate and Rhythm: Normal rate and regular rhythm.  Pulmonary: Decreased air entry bibasilarly bilaterally Neurological:     Mental Status: [name]. Mental status is at baseline.  Psychiatric:        Mood and Affect: Mood normal.        Behavior: Behavior normal.        Data reviewed:  Vitals:   10/31/23 0500 10/31/23  0804 10/31/23 1246 10/31/23 1312  BP:  (!) 155/74 118/60   Pulse:  96 92   Resp:  20    Temp:  98.3 F (36.8 C) 98.5 F (36.9 C)   TempSrc:   Axillary   SpO2:  (!) 84% 93% (!) 83%  Weight: 98 kg     Height:          Latest Ref Rng & Units 10/31/2023    6:45 AM 10/30/2023    6:36 AM 10/29/2023    4:13 AM  CBC  WBC 4.0 - 10.5 K/uL 28.7  28.3  23.1   Hemoglobin 12.0 - 15.0 g/dL 8.2  7.4  7.1   Hematocrit  36.0 - 46.0 % 29.5  27.5  25.4   Platelets 150 - 400 K/uL 184  159  136        Latest Ref Rng & Units 10/31/2023    6:45 AM 10/30/2023    6:36 AM 10/29/2023    4:13 AM  BMP  Glucose 70 - 99 mg/dL 161  096  045   BUN 8 - 23 mg/dL 27  21  19    Creatinine 0.44 - 1.00 mg/dL 4.09  8.11  9.14   Sodium 135 - 145 mmol/L 140  142  138   Potassium 3.5 - 5.1 mmol/L 3.6  3.5  3.0   Chloride 98 - 111 mmol/L 99  99  96   CO2 22 - 32 mmol/L 30  35  33   Calcium 8.9 - 10.3 mg/dL 8.3  8.4  7.8      Author: Loyce Dys, MD 10/31/2023 3:09 PM  For on call review [email or web address].

## 2023-10-31 NOTE — Progress Notes (Signed)
Pulmonology Progress Note   Patient: Elizabeth Rowland EAV:[id number] DOB: 1941-08-09 DOA: 10/24/2023     7 DOS: the patient was seen and examined on 10/31/2023   Brief hospital course: .   10/31/23-patient with diastolic CHF, ILD and hypoxemia.  Here for acute on chronic hypoxemic respiratory failure. Has had negative infectious workup thus far but severely hypoxemic.  She is on maximal support with HFNC and BIPAP. She's been on steroids and we tried weaning it but she flared again.  Today I started a pulse dose for her with plan for 3 day tx.  We will obtain ABG to have acccurate readings on oxygenation because she's lucid and speaking in full sentences but peripheral spO2 reading is low and without consistent pleth. She's not coughing and not producing any phlegm.  She is DNR/DNI.  We discussed intubation and she is clear about not wanting any artificial life support of invasive ventilation. We have palliative care following and in discussion with family regarding her progressive comorbid status.      Narrative & Impression  CLINICAL DATA:  Pneumonia, complication suspected, xray done Diffuse/interstitial lung disease   EXAM: CT CHEST WITH CONTRAST   TECHNIQUE: Multidetector CT imaging of the chest was performed during intravenous contrast administration.   RADIATION DOSE REDUCTION: This exam was performed according to the departmental dose-optimization program which includes automated exposure control, adjustment of the mA and/or kV according to patient size and/or use of iterative reconstruction technique.   CONTRAST:  75mL OMNIPAQUE IOHEXOL 300 MG/ML  SOLN   COMPARISON:  X-ray 10/24/2023, CT 11/29/2021   FINDINGS: Cardiovascular: Mild cardiomegaly. Trace pericardial fluid. Lipomatous hypertrophy of the interatrial septum. Thoracic aorta is nonaneurysmal. Atherosclerotic calcifications of the aorta and coronary arteries. Central pulmonary vasculature is mildly dilated.    Mediastinum/Nodes: Multiple enlarged mediastinal and right hilar lymph nodes. Reference nodes include 1.6 cm lower right paratracheal node (series 2, image 49), 1.4 cm AP window node (series 2, image 53), 1.4 cm right hilar node (series 2, image 63). These nodes have all increased in size when compared to the prior CT from 2023. Several small left hilar lymph nodes are present. No axillary lymphadenopathy. 2.3 cm left thyroid lobe nodule. Trachea and esophagus within normal limits. Small hiatal hernia.   Lungs/Pleura: Extensive airspace opacity throughout both lungs involving all lobes, findings are slightly worse within the right lung. Background of interstitial lung disease, not well assessed given the degree of airspace consolidation. No pleural effusion or pneumothorax.   Upper Abdomen: No acute abnormality.   Musculoskeletal: No chest wall abnormality. No acute or significant osseous findings.   IMPRESSION: 1. Extensive airspace opacity throughout both lungs, slightly worse within the right lung. Findings are most suspicious for multifocal pneumonia. 2. Background of interstitial lung disease, not well assessed given the degree of airspace consolidation. Consider follow-up high-resolution chest CT on an outpatient basis following the resolution of patient's acute symptoms. 3. Mediastinal and right hilar lymphadenopathy, increased in size when compared to the prior CT from 2023. Findings are favored to be reactive. 4. Mild cardiomegaly with mild dilation of the central pulmonary vasculature, suggesting pulmonary arterial hypertension. 5. Aortic and coronary artery atherosclerosis (ICD10-I70.0). 6. Incidentally noted 2.3 cm left thyroid lobe nodule. Recommend thyroid US (ref: J Am Coll Radiol. 2015 Feb;12(2): 143-50).     Electronically Signed   By: Duanne Guess D.O.   On: 10/28/2023 19:56       ASSESSMENT & PLAN:    Fluid overload/Edema:  Currently on Lasix  40mg  Daily  Strict I/O with 1200 cc fluid restriction    Acute on chronic respiratory failure with hypoxia due to multifocal pneumonia:  Elevated lactic acid has resolved  Sepsis RULED OUT RVP 10/26/2023:   negative WBC 28.3 today - elevated from yesterday at 23.1 - continued with elevation but may be effected by current steroids - we will decrease mehtylprednisone to 80 BID H/H 7.4/27.4 CO2 - slightly elevated at 35 Legionella and strep pneumococcal antigen - Negative Blood Cultures Negative 10/30/2023 CT and chest x-ray concerning for multifocal pneumonia Continue supportive O2,  Currently on high flow [place] currently at FiO2 94%  - wean as tolerated - Continue on Rocephin and doxycycline Continue bronchodilators    COPD/ Interstitial Lung Disease: Bronchodilators - Albuterol Neb Q8H with Chest physiotherapy - MedaNeb Methylprednisone IV - pulse dose 1g x 3d Aggressive Incentive spirometry use with cough ABG today    Anemia with known Cardiac disease  Goal of Hemoglobin > 8 - currently 7.4  transfusion of 1 unit of PRBC per hospital protocol    HLD (hyperlipidemia) Continue pravastatin   Diabetes mellitus without complication (HCC):  Recent A1c 8.1, at goal for age  Continue insulin therapy   Chronic diastolic CHF (congestive heart failure) (HCC) 2D echo on 01/08/2021 showed EF of 55 to 60% with grade 1 diastolic dysfunction.  Patient has had BMP 372, and chronic lateral leg lymphedema, cannot rule out CHF exacerbation. 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  Counseled on weight loss when medically stable    Code  Status:DNR/DNI   Barriers to dispo / significant pending items: respiratory status   Family Communication: Family at bedside - Granddaughter and Son at bedside. Discussed Respiratory status and answered all questions of concern    Physical Exam Constitutional:      General: She is not in acute distress. Cardiovascular:     Rate and Rhythm: Normal rate and regular rhythm.  Pulmonary: Air entry decreased bilaterally.  Wheezing in upper lobes and diminished bases.   Extremities:  Mild bilateral lower extremity edema.  Neurological:     Mental Status: [name]. Mental status is at baseline.  Psychiatric:        Mood and Affect: Mood normal.        Behavior: Behavior normal.     Vitals:   10/31/23 0343 10/31/23 0344 10/31/23 0500 10/31/23 0804  BP: (!) 128/56   (!) 155/74  Pulse: 89 90  96  Resp: (!) 30 (!) 27  20  Temp: 97.8 F (36.6 C)   98.3 F (36.8 C)  TempSrc: Oral     SpO2: 90% (!) 89%  (!) 84%  Weight:   98 kg   Height:        Data Reviewed:    Latest Ref Rng & Units 10/31/2023    6:45 AM 10/30/2023    6:36 AM 10/29/2023    4:13 AM  BMP  Glucose 70 - 99 mg/dL 161  096  045   BUN 8 - 23 mg/dL 27  21  19  Creatinine 0.44 - 1.00 mg/dL 9.14  7.82  9.56   Sodium 135 - 145 mmol/L 140  142  138   Potassium 3.5 - 5.1 mmol/L 3.6  3.5  3.0   Chloride 98 - 111 mmol/L 99  99  96   CO2 22 - 32 mmol/L 30  35  33   Calcium 8.9 - 10.3 mg/dL 8.3  8.4  7.8        Latest Ref Rng & Units 10/31/2023    6:45 AM 10/30/2023    6:36 AM 10/29/2023    4:13 AM  CBC  WBC 4.0 - 10.5 K/uL 28.7  28.3  23.1   Hemoglobin 12.0 - 15.0 g/dL 8.2  7.4  7.1   Hematocrit 36.0 - 46.0 % 29.5  27.5  25.4   Platelets 150 - 400 K/uL 184  159  136        Lab Results WBC  Date/Time Value Ref Range Status  10/31/2023 06:45 AM 28.7 (H) 4.0 - 10.5 K/uL Final  10/30/2023 06:36 AM 28.3 (H) 4.0 - 10.5 K/uL Final  10/29/2023 04:13 AM 23.1 (H) 4.0 - 10.5 K/uL Final   Neutrophils Relative %   Date/Time Value Ref Range Status  10/31/2023 06:45 AM 78 % Final  10/30/2023 06:36 AM 53 % Final   pCO2 arterial  Date/Time Value Ref Range Status  10/31/2023 04:22 AM 58 (H) 32 - 48 mmHg Final   Lactic Acid, Venous  Date/Time Value Ref Range Status  10/25/2023 07:42 PM 1.8 0.5 - 1.9 mmol/L Final    Comment:    Performed at [facility], [address] [place], [place] [address]  10/25/2023 12:18 AM 1.2 0.5 - 1.9 mmol/L Final    Comment:    Performed at [facility], [address] [place], [place] [address]  10/24/2023 06:15 PM 1.3 0.5 - 1.9 mmol/L Final    Comment:    Performed at [facility], [address] [place], [place] [address]   No results found for: "PCO2VEN"   Time spent: 45 minutes  Author: Vida Rigger, MD 10/31/2023 10:39 AM  For on call review [email or web address].

## 2023-10-31 NOTE — Progress Notes (Addendum)
Pt received from 2A via bed around 5:15 pm, accompanied by Care RN, Jonny Ruiz and RT, Carollee Herter. Pt alert, on Bipap, and then changed to HHFNC and NRB mask. Pt assessed and repositioned, remains on floor bed due to activity intolerance. Had BM, purewick changed, had anxiety and resp distress with minimal activity and desat to 60's. Recovered with sats to upper 70's and low 80's with rest and encouragement. CHG bath done. Son to bedside. Pt had a few bites of "magic cup" ice cream and a few sips water. No difficulty with swallowing; encouraged to go slowly, one bite at a time.

## 2023-10-31 NOTE — Plan of Care (Signed)
Problem: Coping: Goal: Ability to adjust to condition or change in health will improve Outcome: Progressing   Problem: Fluid Volume: Goal: Ability to maintain a balanced intake and output will improve Outcome: Progressing   Problem: Clinical Measurements: Goal: Ability to maintain clinical measurements within normal limits will improve Outcome: Progressing

## 2023-10-31 NOTE — Care Management Important Message (Signed)
Important Message  Patient Details  Name: Elizabeth Rowland MRN: [id number] Date of Birth: May 05, 1941   Important Message Given:  Yes - Medicare IM     Sherilyn Banker 10/31/2023, 11:08 AM

## 2023-10-31 NOTE — Consult Note (Signed)
 Consultation Note Date: 10/31/2023   Patient Name: Elizabeth Rowland  DOB: [birth year]  MRN: [id number]  Age / Sex: 83 y.o., female  PCP: Shane Crutch, [place] Referring Physician: Loyce Dys, MD  Reason for Consultation: Establishing goals of care   HPI/Brief Hospital Course: 83 y.o. female  with past medical history of DVT/PE on Eliquis, HTN, HLD, CHF and ILD admitted from home on 10/24/2023 with severe respiratory distress and significant hypoxia. Admitted and being treated for acute on chronic respiratory failure due to multifocal PNA.  During hospitalization has required intermittent bipap and HHFNC support  Palliative medicine was consulted for assisting with goals of care conversations.  Subjective:  Extensive chart review has been completed prior to meeting patient including labs, vital signs, imaging, progress notes, orders, and available advanced directive documents from current and previous encounters.  Secure chart received from nursing staff regarding Ms. Kowalke's change in condition, unable to tolerate HHFNC this AM and was being placed back on bipap. Reportedly oxygen saturations dropped to low 50's during short time of HHFNC.  Met with Ms. Wollam in collaboration with Dr. Karna Christmas. Ms. Lisbon remained on bipap, she is awake, alert and able to respond appropriately. Detailed goals of care conversations challenging with her remaining on bipap. No family at bedside during time of initial visit. Dr. Karna Christmas optimizing steroid dosing to significantly high dose, feels current presentation is related to a recurrent flare of ILD. He speaks to Ms. Caradine's high risk of decompensation and likely poor prognosis. He confirms DNR/DNI status with Ms. Luebbe. Plan to obtain ABG and based on results may be able to potentially trial off bipap again with the hope to have GOC with Ms. Doscher.  Collaborated with RT with plan to trial off bipap to  HHFNC.  Returned to bedside. Ms. Wernli being transitioned to [facility]. Son-[name] at bedside during time of visit.  Introduced myself as a Publishing rights manager as a member of the palliative care team. Explained palliative medicine is specialized medical care for people living with serious illness. It focuses on providing relief from the symptoms and stress of a serious illness. The goal is to improve quality of life for both the patient and the family.   Ms. Gehrig tolerating HHFNC with stable oxygen saturations as compared to being on bipap. Ms. Sakuma able to answer orientation questions appropriately. She shares she does not like the bipap mainly because she is not allowed to eat and drink.  We discussed patient's current illness and what it means in the larger context of patient's on-going co-morbidities. Natural disease trajectory and expectations at EOL were discussed.   Attempted to elicit goals of care. We discussed recurrent flare of ILD also further complicated by PNA. We discussed pulmonology treatment plan with further interventions being limited.  The difference between aggressive medical intervention and comfort care was discussed.  Ms. Rottman shares she wishes to make her own medical decisions and does not wish to involve her son. She shares she is "not sure" on what her wishes are at this time. She shares she wishes to "go home" but attempted to explain to her she is requiring significant amount of oxygen and would not survive if she were to go home.   Encouraged Ms. Rampey to continue thinking about her wishes and discussing with family if she feels appropriate. At this time she agrees to bipap if needed and transfer to ICU for closer monitoring.  At end of conversations, Ms. Risk requiring HHFNC as well as  NRB to maintain adequate oxygen saturations.  All questions/concerns addressed. Emotional support provided to patient/family/support persons. PMT will continue to follow  and support patient as needed.  Objective: Primary Diagnoses: Present on Admission:  Multifocal pneumonia  HLD (hyperlipidemia)  COPD (chronic obstructive pulmonary disease) (HCC)  Chronic diastolic CHF (congestive heart failure) (HCC)  Pulmonary embolism (HCC)  DVT (deep venous thrombosis) (HCC)  Anxiety  Obesity (BMI 30-39.9)  Myocardial injury  Prolonged QT interval  Acute on chronic respiratory failure with hypoxia (HCC)   Physical Exam Constitutional:      General: She is not in acute distress.    Appearance: She is ill-appearing.  Pulmonary:     Effort: Pulmonary effort is normal. No respiratory distress.  Skin:    General: Skin is warm and dry.  Neurological:     Mental Status: [name] and oriented to person, place, and time.     Motor: Weakness present.  Psychiatric:        Mood and Affect: Mood normal.        Thought Content: Thought content normal.     Vital Signs: BP 118/60 (BP Location: Left Arm)   Pulse 92   Temp 98.5 F (36.9 C) (Axillary)   Resp 20   Ht 5\' 4"  (1.626 m)   Wt 98 kg   SpO2 (!) 87%   BMI 37.09 kg/m  Pain Scale: 0-10 POSS *See Group Information*: S-Acceptable,Sleep, easy to arouse Pain Score: 7   IO: Intake/output summary:  Intake/Output Summary (Last 24 hours) at 10/31/2023 1538 Last data filed at 10/31/2023 0800 Gross per 24 hour  Intake 456 ml  Output 1800 ml  Net -1344 ml    LBM: Last BM Date : 10/24/23 Baseline Weight: Weight: 90.7 kg Most recent weight: Weight: 98 kg      Assessment and Plan  SUMMARY OF RECOMMENDATIONS   Ongoing GOC needed  Palliative Prophylaxis:   Bowel Regimen, Delirium Protocol and Frequent Pain Assessment  Discussed With: Nursing staff, primary team and pulmonology   Thank you for this consult and allowing Palliative Medicine to participate in the care of Georges Mouse. Palliative medicine will continue to follow and assist as needed.   Time Total: 75 minutes  Time spent  includes: Detailed review of medical records (labs, imaging, vital signs), medically appropriate exam (mental status, respiratory, cardiac, skin), discussed with treatment team, counseling and educating patient, family and staff, documenting clinical information, medication management and coordination of care.   Signed by: Leeanne Deed, DNP, AGNP-C Palliative Medicine    Please contact Palliative Medicine Team phone at [phone] for questions and concerns.  For individual provider: See Loretha Stapler

## 2023-10-31 NOTE — Progress Notes (Signed)
Placed patient back on bipap due to low saturations on HHFNC.

## 2023-10-31 NOTE — Progress Notes (Signed)
*  PRELIMINARY RESULTS* Echocardiogram 2D Echocardiogram has been performed.  Elizabeth Rowland 10/31/2023, 3:16 PM

## 2023-11-01 DIAGNOSIS — J439 Emphysema, unspecified: Secondary | ICD-10-CM | POA: Diagnosis not present

## 2023-11-01 DIAGNOSIS — J189 Pneumonia, unspecified organism: Secondary | ICD-10-CM | POA: Diagnosis not present

## 2023-11-01 DIAGNOSIS — Z515 Encounter for palliative care: Secondary | ICD-10-CM | POA: Diagnosis not present

## 2023-11-01 DIAGNOSIS — J9601 Acute respiratory failure with hypoxia: Secondary | ICD-10-CM | POA: Diagnosis not present

## 2023-11-01 LAB — MRSA NEXT GEN BY PCR, NASAL: MRSA by PCR Next Gen: NOT DETECTED

## 2023-11-01 LAB — CBC WITH DIFFERENTIAL/PLATELET
Abs Immature Granulocytes: 1.95 10*3/uL — ABNORMAL HIGH (ref 0.00–0.07)
Basophils Absolute: 0.1 10*3/uL (ref 0.0–0.1)
Basophils Relative: 0 %
Eosinophils Absolute: 0.4 10*3/uL (ref 0.0–0.5)
Eosinophils Relative: 1 %
HCT: 29 % — ABNORMAL LOW (ref 36.0–46.0)
Hemoglobin: 8.4 g/dL — ABNORMAL LOW (ref 12.0–15.0)
Immature Granulocytes: 6 %
Lymphocytes Relative: 3 %
Lymphs Abs: 0.9 10*3/uL (ref 0.7–4.0)
MCH: 22.8 pg — ABNORMAL LOW (ref 26.0–34.0)
MCHC: 29 g/dL — ABNORMAL LOW (ref 30.0–36.0)
MCV: 78.8 fL — ABNORMAL LOW (ref 80.0–100.0)
Monocytes Absolute: 3.6 10*3/uL — ABNORMAL HIGH (ref 0.1–1.0)
Monocytes Relative: 11 %
Neutro Abs: 24.6 10*3/uL — ABNORMAL HIGH (ref 1.7–7.7)
Neutrophils Relative %: 79 %
Platelets: 237 10*3/uL (ref 150–400)
RBC: 3.68 MIL/uL — ABNORMAL LOW (ref 3.87–5.11)
RDW: 19.9 % — ABNORMAL HIGH (ref 11.5–15.5)
Smear Review: NORMAL
WBC: 31.5 10*3/uL — ABNORMAL HIGH (ref 4.0–10.5)
nRBC: 0.9 % — ABNORMAL HIGH (ref 0.0–0.2)

## 2023-11-01 LAB — C-REACTIVE PROTEIN: CRP: 9.4 mg/dL — ABNORMAL HIGH (ref <1.0)

## 2023-11-01 LAB — BLOOD GAS, ARTERIAL
Acid-Base Excess: 14.6 mmol/L — ABNORMAL HIGH (ref 0.0–2.0)
Bicarbonate: 40.4 mmol/L — ABNORMAL HIGH (ref 20.0–28.0)
FIO2: 100 %
O2 Content: 60 L/min
O2 Saturation: 89.9 %
Patient temperature: 37
pCO2 arterial: 53 mm[Hg] — ABNORMAL HIGH (ref 32–48)
pH, Arterial: 7.49 — ABNORMAL HIGH (ref 7.35–7.45)
pO2, Arterial: 54 mm[Hg] — ABNORMAL LOW (ref 83–108)

## 2023-11-01 LAB — GLUCOSE, CAPILLARY
Glucose-Capillary: 159 mg/dL — ABNORMAL HIGH (ref 70–99)
Glucose-Capillary: 147 mg/dL — ABNORMAL HIGH (ref 70–99)
Glucose-Capillary: 154 mg/dL — ABNORMAL HIGH (ref 70–99)
Glucose-Capillary: 253 mg/dL — ABNORMAL HIGH (ref 70–99)

## 2023-11-01 LAB — BASIC METABOLIC PANEL
Anion gap: 11 (ref 5–15)
BUN: 38 mg/dL — ABNORMAL HIGH (ref 8–23)
CO2: 32 mmol/L (ref 22–32)
Calcium: 8.6 mg/dL — ABNORMAL LOW (ref 8.9–10.3)
Chloride: 99 mmol/L (ref 98–111)
Creatinine, Ser: 1.01 mg/dL — ABNORMAL HIGH (ref 0.44–1.00)
GFR, Estimated: 55 mL/min — ABNORMAL LOW (ref >60)
Glucose, Bld: 171 mg/dL — ABNORMAL HIGH (ref 70–99)
Potassium: 3.1 mmol/L — ABNORMAL LOW (ref 3.5–5.1)
Sodium: 142 mmol/L (ref 135–145)

## 2023-11-01 MED ORDER — POTASSIUM CHLORIDE CRYS ER 20 MEQ PO TBCR
40.0000 meq | EXTENDED_RELEASE_TABLET | Freq: Once | ORAL | Status: AC
  Administered 2023-11-01: 40 meq via ORAL
  Filled 2023-11-01: qty 2

## 2023-11-01 MED ORDER — CHLORHEXIDINE GLUCONATE CLOTH 2 % EX PADS
6.0000 | MEDICATED_PAD | Freq: Every day | CUTANEOUS | Status: DC
  Administered 2023-11-01 – 2023-11-07 (×6): 6 via TOPICAL

## 2023-11-01 MED ORDER — SODIUM CHLORIDE 0.9 % IV SOLN: INTRAVENOUS | Status: AC | PRN

## 2023-11-01 MED ORDER — ENSURE ENLIVE PO LIQD
237.0000 mL | Freq: Two times a day (BID) | ORAL | Status: DC
  Administered 2023-11-02 – 2023-11-07 (×9): 237 mL via ORAL

## 2023-11-01 MED ORDER — CHLORHEXIDINE GLUCONATE CLOTH 2 % EX PADS
6.0000 | MEDICATED_PAD | Freq: Every day | CUTANEOUS | Status: DC
  Administered 2023-11-01 – 2023-11-06 (×6): 6 via TOPICAL

## 2023-11-01 NOTE — Progress Notes (Signed)
Progress Note   Patient: Elizabeth Rowland ZOX:[id number] DOB: 10/10/1940 DOA: 10/24/2023     8 DOS: the patient was seen and examined on 11/01/2023     Brief hospital course:  83 y.o. female with medical history significant of PE and DVT on Eliquis, HTN, HDL, DM, COPD, dCHf, anxiety, obesity, lymphedema, who presents with shortness of breath.  Per report, patient was found to have severe respiratory distress, with oxygen desaturation to 50% on room air, initially started on CPAP with 85% of saturation, then BiPAP started in ED with improvement.    02/07: admitted to hospitalist service, needing BiPap support into early 02/09 02/09-02/10: remains on HHF [place] O2 support  02/11: really not weaning down on high flow, will get CT chest, based on exam suspect may have pleural effusion, eval for PE would be prudent though she has been compliant w/ eliquis she has hx      ASSESSMENT & PLAN:   Acute on chronic respiratory failure with hypoxia due to multifocal pneumonia:  Elevated lactic acid more likely due to hypoxia.  Sepsis RULED OUT RVP nothing viral detected Continue supportive O2, wean as tolerated Continue cefepime  Has completed doxycycline course Continue bronchodilators as needed, Follow urine for Legionella as well as strep pneumococcal antigen Follow-up on culture results Continue high flow nasal oxygenation I have reviewed chest x-ray showing extensive opacity bilaterally concerning for multifocal pneumonia Palliative care as well as pulmonologist on board and case discussed Continue steroid management according to pulmonologist recommendation      COPD (chronic obstructive pulmonary disease) (HCC): Continue bronchodilators as as above   HLD (hyperlipidemia) Continue pravastatin   Diabetes mellitus without complication (HCC):  Recent A1c 8.1, at goal for age  Continue insulin therapy   Chronic diastolic CHF (congestive heart failure) (HCC) 2D echo on 01/08/2021 showed  EF of 55 to 60% with grade 1 diastolic dysfunction.  Patient has had BMP 372, and chronic lateral leg lymphedema, cannot rule out CHF exacerbation. 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  Counseled on weight loss when medically stable   DVT prophylaxis: Eliquis   Code Status: DNI DNR   Barriers to dispo / significant pending items: respiratory status   Subjective  Patient seen and examined at bedside this morning Currently on facemasks as well as high flow nasal oxygen at 100% FiO2 Patient unable to tolerate BiPAP Pulmonologist have agreed to keep saturation above 80% Pulmonologist trying pulsed steroid therapy for the next couple of days before finalizing goals of care   Family Communication: none at this time      Physical Exam Constitutional:      General: Patient in some respiratory distress on 95% high flow oxygen Cardiovascular:     Rate and Rhythm: Normal rate and regular rhythm.  Pulmonary: Decreased air entry bibasilarly bilaterally Neurological:     Mental Status: [name]. Mental status is at baseline.  Psychiatric:        Mood and Affect: Mood  normal.        Behavior: Behavior normal.        Data reviewed:   Vitals:   11/01/23 1400 11/01/23 1441 11/01/23 1500 11/01/23 1600  BP: (!) 117/92  122/81 (!) 131/51  Pulse: 90 94 85 93  Resp: (!) 24 (!) 30 (!) 25 (!) 23  Temp:      TempSrc:      SpO2: 90% (!) 88% (!) 88% (!) 83%  Weight:      Height:          Latest Ref Rng & Units 11/01/2023    3:16 AM 10/31/2023    6:45 AM  10/30/2023    6:36 AM  CBC  WBC 4.0 - 10.5 K/uL 31.5  28.7  28.3   Hemoglobin 12.0 - 15.0 g/dL 8.4  8.2  7.4   Hematocrit 36.0 - 46.0 % 29.0  29.5  27.5   Platelets 150 - 400 K/uL 237  184  159      Author: Loyce Dys, MD 11/01/2023 4:27 PM  For on call review [email or web address].

## 2023-11-01 NOTE — Plan of Care (Addendum)
Taking sips of water and juice and bites of food (pureed diet) today; sats improved to mid-upper 80's at rest as the day has progressed. Remains on HHFNC and NRB for additional flow. Did not tolerate being moved about in bed for cleaning due to urinary incontinence; foley placed. Good output (total 500 cc at time of this writing, since foley placed. Given dose of morphine for abdominal and back pain and pt reports relief of pain, and feeling better. Palliative NP spent time at bedside talking with pt. Tele shows NSR/ST, BP stable. Chaplain consult placed per family request.  Problem: Coping: Goal: Ability to adjust to condition or change in health will improve Outcome: Progressing   Problem: Fluid Volume: Goal: Ability to maintain a balanced intake and output will improve Outcome: Progressing   Problem: Metabolic: Goal: Ability to maintain appropriate glucose levels will improve Outcome: Progressing   Problem: Nutritional: Goal: Maintenance of adequate nutrition will improve Outcome: Progressing Goal: Progress toward achieving an optimal weight will improve Outcome: Not Progressing   Problem: Skin Integrity: Goal: Risk for impaired skin integrity will decrease Outcome: Progressing   Problem: Tissue Perfusion: Goal: Adequacy of tissue perfusion will improve Outcome: Progressing   Problem: Clinical Measurements: Goal: Ability to maintain clinical measurements within normal limits will improve Outcome: Progressing Goal: Will remain free from infection Outcome: Progressing Goal: Diagnostic test results will improve Outcome: Progressing Goal: Respiratory complications will improve Outcome: Progressing Goal: Cardiovascular complication will be avoided Outcome: Progressing   Problem: Activity: Goal: Risk for activity intolerance will decrease Outcome: Progressing   Problem: Nutrition: Goal: Adequate nutrition will be maintained Outcome: Progressing   Problem: Coping: Goal:  Level of anxiety will decrease Outcome: Progressing   Problem: Elimination: Goal: Will not experience complications related to bowel motility Outcome: Progressing Goal: Will not experience complications related to urinary retention Outcome: Progressing   Problem: Pain Managment: Goal: General experience of comfort will improve and/or be controlled Outcome: Progressing   Problem: Safety: Goal: Ability to remain free from injury will improve Outcome: Progressing   Problem: Skin Integrity: Goal: Risk for impaired skin integrity will decrease Outcome: Progressing   Problem: Education: Goal: Ability to demonstrate management of disease process will improve Outcome: Not Progressing Goal: Ability to verbalize understanding of medication therapies will improve Outcome: Not Progressing   Problem: Activity: Goal: Capacity to carry out activities will improve Outcome: Not Progressing   Problem: Cardiac: Goal: Ability to achieve and maintain adequate cardiopulmonary perfusion will improve Outcome: Progressing   Problem: Activity: Goal: Ability to tolerate increased activity will improve Outcome: Progressing Goal: Will verbalize the importance of balancing activity with adequate rest periods Outcome: Progressing   Problem: Respiratory: Goal: Ability to maintain a clear airway will improve Outcome: Progressing Goal: Levels of oxygenation will improve Outcome: Progressing   Problem: Activity: Goal: Ability to tolerate increased activity will improve Outcome: Progressing   Problem: Clinical Measurements: Goal: Ability to maintain a body temperature in the normal range will improve Outcome: Progressing   Problem: Respiratory: Goal: Ability to maintain adequate ventilation will improve Outcome: Progressing Goal: Ability to maintain a clear airway will improve Outcome: Progressing

## 2023-11-01 NOTE — Progress Notes (Signed)
O2 saturation very tenuous, if she removed non rebreather or attempts to speak, her O2 sat will drop into the 70's at best, when we turned her to clean her, her o2 sat dropped into the 60's. Periods of anxiety, agitation due to air hunger and having her face covered with the non rebreather and heated high flow.

## 2023-11-01 NOTE — Progress Notes (Addendum)
Daily Progress Note   Patient Name: Elizabeth Rowland       Date: 11/01/2023 DOB: [birth year]  Age: 83 y.o. MRN#: [id number] Attending Physician: Loyce Dys, MD Primary Care Physician: Shane Crutch, [place] Admit Date: 10/24/2023  Reason for Consultation/Follow-up: Establishing goals of care  HPI/Brief Hospital Review:  83 y.o. female  with past medical history of DVT/PE on Eliquis, HTN, HLD, CHF and ILD admitted from home on 10/24/2023 with severe respiratory distress and significant hypoxia. Admitted and being treated for acute on chronic respiratory failure due to multifocal PNA.   During hospitalization has required intermittent bipap and HHFNC support   Palliative medicine was consulted for assisting with goals of care conversations.  Subjective: Extensive chart review has been completed prior to meeting patient including labs, vital signs, imaging, progress notes, orders, and available advanced directive documents from current and previous encounters.    Visited with [name] at her bedside. She is awake, alert, able to engage in conversation. She remains on HHFNC at 100% as well as NRB. Oxygen saturations maintaining 80-85%. No family at bedside during time of visit.  Assessed symptoms, [name] shares she feels her breathing is slightly improved form yesterday, denies acute pain or discomfort. Requesting sips of water-tolerates well.  Again attempted to elicit goals of care. Discussed current condition, tenuous respiratory status and high risk of decompensation. We discussed the difference between continuing with aggressive treatment and interventions versus shifting focus to comfort care. [name] shares she "doesn't know what to do." She shares she is hopeful that she can  recover but also speaks to being tired and ready to go home. Emotional support provided. Will continue with current plan of care at this time, PMT will continue to follow closely.  Will follow up later with family if they visit at bedside.  Addendum: Returned to bedside, [name] awake and alert, maintaining saturation goal without NRB, remains on HHFNC 100%. Son at bedside, provided medical updates, signs of slight improvement, remains high risk for decompensation.  Answered and addressed all questions and concerns. PMT to continue to follow for ongoing needs and support.  Objective:  Physical Exam Constitutional:      General: She is not in acute distress.    Appearance: She is ill-appearing.  Pulmonary:     Effort: Pulmonary  effort is normal. No respiratory distress.  Skin:    General: Skin is warm and dry.  Neurological:     Mental Status: [name] and oriented to person, place, and time.     Motor: Weakness present.  Psychiatric:        Mood and Affect: Mood normal.        Thought Content: Thought content normal.             Vital Signs: BP (!) 117/92   Pulse 94   Temp 98 F (36.7 C) (Axillary)   Resp (!) 30   Ht 5\' 4"  (1.626 m)   Wt 98 kg   SpO2 (!) 88%   BMI 37.09 kg/m  SpO2: SpO2: (!) 88 % O2 Device: O2 Device: Heated High Flow Nasal Cannula (NRB removed) O2 Flow Rate: O2 Flow Rate (L/min): 60 L/min   Palliative Care Assessment & Plan   Assessment/Recommendation/Plan  DNR/DNI Continue current plan of care High risk for decline PMT to continue to follow for ongoing needs and support  Care plan was discussed with nursing staff  Thank you for allowing the Palliative Medicine Team to assist in the care of this patient.  Total time:  50 minutes  Time spent includes: Detailed review of medical records (labs, imaging, vital signs), medically appropriate exam (mental status, respiratory, cardiac, skin), discussed with treatment team, counseling and  educating patient, family and staff, documenting clinical information, medication management and coordination of care.  Leeanne Deed, DNP, AGNP-C Palliative Medicine   Please contact Palliative Medicine Team phone at [phone] for questions and concerns.

## 2023-11-01 NOTE — Progress Notes (Signed)
Visited initally with pt per consult- family requested visit for their loved one. When I entered room pt was resting but woke up to my knock-I introduced myself and chaplain services and she welcomed me in-after a couple moments, I noticed she was dosing off to sleep-I asked if she wanted me to stay or come back-she said dinner time, I asked to clarify, "would you like me to come back closer to dinner so you can rest?" She nodded yes. Touched base with nurse-I will return this evening around dinner

## 2023-11-01 NOTE — Progress Notes (Signed)
Pulmonology Progress Note   Patient: Elizabeth Rowland ZOX:[id number] DOB: 07/28/1941 DOA: 10/24/2023     8 DOS: the patient was seen and examined on 11/01/2023   Brief hospital course: .   10/31/23-patient seen at bedside.  She is s/p pulse dosing steroids yesterday now saturation is improved but only slightly.  She's on maximum O2 support via non rebreather and HHFNC.  She remains DNR/DNI.  She is verbalizing improvement and reports adequate analgesia.  She is due for next steroid 1g dose today and last one in am tommorow. Repeat ABG in process.      Narrative & Impression  CLINICAL DATA:  Pneumonia, complication suspected, xray done Diffuse/interstitial lung disease   EXAM: CT CHEST WITH CONTRAST   TECHNIQUE: Multidetector CT imaging of the chest was performed during intravenous contrast administration.   RADIATION DOSE REDUCTION: This exam was performed according to the departmental dose-optimization program which includes automated exposure control, adjustment of the mA and/or kV according to patient size and/or use of iterative reconstruction technique.   CONTRAST:  75mL OMNIPAQUE IOHEXOL 300 MG/ML  SOLN   COMPARISON:  X-ray 10/24/2023, CT 11/29/2021   FINDINGS: Cardiovascular: Mild cardiomegaly. Trace pericardial fluid. Lipomatous hypertrophy of the interatrial septum. Thoracic aorta is nonaneurysmal. Atherosclerotic calcifications of the aorta and coronary arteries. Central pulmonary vasculature is mildly dilated.   Mediastinum/Nodes: Multiple enlarged mediastinal and right hilar lymph nodes. Reference nodes include 1.6 cm lower right paratracheal node (series 2, image 49), 1.4 cm AP window node (series 2, image 53), 1.4 cm right hilar node (series 2, image 63). These nodes have all increased in size when compared to the prior CT from 2023. Several small left hilar lymph nodes are present. No axillary lymphadenopathy. 2.3 cm left thyroid lobe nodule. Trachea  and esophagus within normal limits. Small hiatal hernia.   Lungs/Pleura: Extensive airspace opacity throughout both lungs involving all lobes, findings are slightly worse within the right lung. Background of interstitial lung disease, not well assessed given the degree of airspace consolidation. No pleural effusion or pneumothorax.   Upper Abdomen: No acute abnormality.   Musculoskeletal: No chest wall abnormality. No acute or significant osseous findings.   IMPRESSION: 1. Extensive airspace opacity throughout both lungs, slightly worse within the right lung. Findings are most suspicious for multifocal pneumonia. 2. Background of interstitial lung disease, not well assessed given the degree of airspace consolidation. Consider follow-up high-resolution chest CT on an outpatient basis following the resolution of patient's acute symptoms. 3. Mediastinal and right hilar lymphadenopathy, increased in size when compared to the prior CT from 2023. Findings are favored to be reactive. 4. Mild cardiomegaly with mild dilation of the central pulmonary vasculature, suggesting pulmonary arterial hypertension. 5. Aortic and coronary artery atherosclerosis (ICD10-I70.0). 6. Incidentally noted 2.3 cm left thyroid lobe nodule. Recommend thyroid US (ref: J Am Coll Radiol. 2015 Feb;12(2): 143-50).     Electronically Signed   By: Duanne Guess D.O.   On: 10/28/2023 19:56       ASSESSMENT & PLAN:    Fluid overload/Edema:   Currently on Lasix 40mg  Daily  Strict I/O with 1200 cc fluid restriction    Acute on chronic respiratory failure with severe ILD exacerbation Elevated lactic acid has resolved  Sepsis RULED OUT RVP 10/26/2023:   negative WBC 28.3 today - elevated from yesterday at 23.1 - continued with elevation but may be effected by current steroids - 1g solumedrol x 3d H/H 7.4/27.4 CO2 - slightly elevated at 35 Legionella and strep  pneumococcal antigen - Negative Blood Cultures  Negative 10/30/2023 CT and chest x-ray concerning for multifocal pneumonia Continue supportive O2,  Currently on high flow [place] currently at FiO2 94%  - wean as tolerated - Continue on Rocephin and doxycycline Continue bronchodilators    COPD/ Interstitial Lung Disease: Bronchodilators - Albuterol Neb Q8H with Chest physiotherapy - MedaNeb Methylprednisone IV - pulse dose 1g x 3d Aggressive Incentive spirometry use with cough ABG today    Anemia with known Cardiac disease  Goal of Hemoglobin > 8 - currently 7.4  transfusion of 1 unit of PRBC per hospital protocol    HLD (hyperlipidemia) Continue pravastatin   Diabetes mellitus without complication (HCC):  Recent A1c 8.1, at goal for age  Continue insulin therapy   Chronic diastolic CHF (congestive heart failure) (HCC) 2D echo on 01/08/2021 showed EF of 55 to 60% with grade 1 diastolic dysfunction.  Patient has had BMP 372, and chronic lateral leg lymphedema, cannot rule out CHF exacerbation. 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  Counseled on weight loss when medically stable    Code Status:DNR/DNI   Barriers to dispo / significant pending items: respiratory status   Family Communication: Family at bedside - Granddaughter and Son at bedside. Discussed Respiratory status and answered all questions of concern    Physical Exam Constitutional:      General: She is not in acute distress. Cardiovascular:     Rate and Rhythm: Normal rate and regular rhythm.  Pulmonary: Air entry decreased bilaterally.  Wheezing in upper lobes and diminished bases.   Extremities:  Mild bilateral lower  extremity edema.  Neurological:     Mental Status: [name]. Mental status is at baseline.  Psychiatric:        Mood and Affect: Mood normal.        Behavior: Behavior normal.     Vitals:   11/01/23 0600 11/01/23 0700 11/01/23 0800 11/01/23 0902  BP: 130/64 (!) 135/57 130/62   Pulse: 95 94 94   Resp: (!) 28 (!) 21 (!) 22   Temp:   98 F (36.7 C)   TempSrc:   Axillary   SpO2: (!) 75% (!) 81% (!) 82% (!) 87%  Weight:      Height:        Data Reviewed:    Latest Ref Rng & Units 11/01/2023    3:16 AM 10/31/2023    6:45 AM 10/30/2023    6:36 AM  BMP  Glucose 70 - 99 mg/dL 782  956  213   BUN 8 - 23 mg/dL 38  27  21   Creatinine 0.44 - 1.00 mg/dL 0.86  5.78  4.69   Sodium 135 - 145 mmol/L 142  140  142   Potassium 3.5 - 5.1 mmol/L 3.1  3.6  3.5   Chloride 98 - 111 mmol/L 99  99  99   CO2 22 - 32 mmol/L 32  30  35   Calcium 8.9 - 10.3 mg/dL 8.6  8.3  8.4        Latest Ref Rng &  Units 11/01/2023    3:16 AM 10/31/2023    6:45 AM 10/30/2023    6:36 AM  CBC  WBC 4.0 - 10.5 K/uL 31.5  28.7  28.3   Hemoglobin 12.0 - 15.0 g/dL 8.4  8.2  7.4   Hematocrit 36.0 - 46.0 % 29.0  29.5  27.5   Platelets 150 - 400 K/uL 237  184  159        Lab Results WBC  Date/Time Value Ref Range Status  11/01/2023 03:16 AM 31.5 (H) 4.0 - 10.5 K/uL Final  10/31/2023 06:45 AM 28.7 (H) 4.0 - 10.5 K/uL Final  10/30/2023 06:36 AM 28.3 (H) 4.0 - 10.5 K/uL Final   Neutrophils Relative %  Date/Time Value Ref Range Status  11/01/2023 03:16 AM 79 % Final  10/31/2023 06:45 AM 78 % Final  10/30/2023 06:36 AM 53 % Final   pCO2 arterial  Date/Time Value Ref Range Status  10/31/2023 10:43 AM 48 32 - 48 mmHg Final  10/31/2023 04:22 AM 58 (H) 32 - 48 mmHg Final   Lactic Acid, Venous  Date/Time Value Ref Range Status  10/25/2023 07:42 PM 1.8 0.5 - 1.9 mmol/L Final    Comment:    Performed at [facility], [address] [place], [place] [address]  10/25/2023 12:18 AM 1.2 0.5 - 1.9 mmol/L  Final    Comment:    Performed at [facility], [address] [place], [place] [address]  10/24/2023 06:15 PM 1.3 0.5 - 1.9 mmol/L Final    Comment:    Performed at [facility], [address] [place], [place] [address]   No results found for: "PCO2VEN"   Critical care provider statement:   Total critical care time: 33 minutes   Performed by: Karna Christmas MD   Critical care time was exclusive of separately billable procedures and treating other patients.   Critical care was necessary to treat or prevent imminent or life-threatening deterioration.   Critical care was time spent personally by me on the following activities: development of treatment plan with patient and/or surrogate as well as nursing, discussions with consultants, evaluation of patient's response to treatment, examination of patient, obtaining history from patient or surrogate, ordering and performing treatments and interventions, ordering and review of laboratory studies, ordering and review of radiographic studies, pulse oximetry and re-evaluation of patient's condition.    Vida Rigger, M.D.  Pulmonary & Critical Care Medicine      For on call review [email or web address].

## 2023-11-01 NOTE — Progress Notes (Signed)
Spent significant time with pt and her son at bedside-pt expressed not feeling she had much to talk about-but her son was very open and sharing about his struggle with watching his mom be sick. And the good and bad days. He expressed his worry around the unknown and how some days she is doing well and other days they don't know if she will make it. He was feeding her ice cream while we spoke and he shared how that made him feel like he could at least do something to help.Chaplain services is around should pt or family need any further needs.

## 2023-11-02 DIAGNOSIS — Z515 Encounter for palliative care: Secondary | ICD-10-CM | POA: Diagnosis not present

## 2023-11-02 DIAGNOSIS — J9601 Acute respiratory failure with hypoxia: Secondary | ICD-10-CM | POA: Diagnosis not present

## 2023-11-02 DIAGNOSIS — J189 Pneumonia, unspecified organism: Secondary | ICD-10-CM | POA: Diagnosis not present

## 2023-11-02 LAB — BASIC METABOLIC PANEL
Anion gap: 13 (ref 5–15)
BUN: 50 mg/dL — ABNORMAL HIGH (ref 8–23)
CO2: 31 mmol/L (ref 22–32)
Calcium: 8.7 mg/dL — ABNORMAL LOW (ref 8.9–10.3)
Chloride: 99 mmol/L (ref 98–111)
Creatinine, Ser: 1.1 mg/dL — ABNORMAL HIGH (ref 0.44–1.00)
GFR, Estimated: 50 mL/min — ABNORMAL LOW (ref >60)
Glucose, Bld: 170 mg/dL — ABNORMAL HIGH (ref 70–99)
Potassium: 3.4 mmol/L — ABNORMAL LOW (ref 3.5–5.1)
Sodium: 143 mmol/L (ref 135–145)

## 2023-11-02 LAB — CBC WITH DIFFERENTIAL/PLATELET
Abs Immature Granulocytes: 1.59 10*3/uL — ABNORMAL HIGH (ref 0.00–0.07)
Basophils Absolute: 0.1 10*3/uL (ref 0.0–0.1)
Basophils Relative: 0 %
Eosinophils Absolute: 0.4 10*3/uL (ref 0.0–0.5)
Eosinophils Relative: 1 %
HCT: 31.2 % — ABNORMAL LOW (ref 36.0–46.0)
Hemoglobin: 8.6 g/dL — ABNORMAL LOW (ref 12.0–15.0)
Immature Granulocytes: 6 %
Lymphocytes Relative: 3 %
Lymphs Abs: 0.8 10*3/uL (ref 0.7–4.0)
MCH: 22.8 pg — ABNORMAL LOW (ref 26.0–34.0)
MCHC: 27.6 g/dL — ABNORMAL LOW (ref 30.0–36.0)
MCV: 82.8 fL (ref 80.0–100.0)
Monocytes Absolute: 4.3 10*3/uL — ABNORMAL HIGH (ref 0.1–1.0)
Monocytes Relative: 15 %
Neutro Abs: 21.2 10*3/uL — ABNORMAL HIGH (ref 1.7–7.7)
Neutrophils Relative %: 75 %
Platelets: 228 10*3/uL (ref 150–400)
RBC: 3.77 MIL/uL — ABNORMAL LOW (ref 3.87–5.11)
RDW: 20.6 % — ABNORMAL HIGH (ref 11.5–15.5)
WBC: 28.4 10*3/uL — ABNORMAL HIGH (ref 4.0–10.5)
nRBC: 1 % — ABNORMAL HIGH (ref 0.0–0.2)

## 2023-11-02 LAB — GLUCOSE, CAPILLARY
Glucose-Capillary: 177 mg/dL — ABNORMAL HIGH (ref 70–99)
Glucose-Capillary: 205 mg/dL — ABNORMAL HIGH (ref 70–99)
Glucose-Capillary: 189 mg/dL — ABNORMAL HIGH (ref 70–99)
Glucose-Capillary: 182 mg/dL — ABNORMAL HIGH (ref 70–99)

## 2023-11-02 LAB — C-REACTIVE PROTEIN: CRP: 4.4 mg/dL — ABNORMAL HIGH (ref <1.0)

## 2023-11-02 MED ORDER — METHYLPREDNISOLONE SODIUM SUCC 125 MG IJ SOLR
125.0000 mg | Freq: Two times a day (BID) | INTRAMUSCULAR | Status: AC
  Administered 2023-11-03 – 2023-11-04 (×3): 125 mg via INTRAVENOUS
  Filled 2023-11-02 (×3): qty 2

## 2023-11-02 MED ORDER — AMMONIUM LACTATE 12 % EX LOTN
TOPICAL_LOTION | CUTANEOUS | Status: DC | PRN
  Filled 2023-11-02: qty 400

## 2023-11-02 MED ORDER — POTASSIUM CHLORIDE CRYS ER 20 MEQ PO TBCR
40.0000 meq | EXTENDED_RELEASE_TABLET | Freq: Once | ORAL | Status: AC
  Administered 2023-11-02: 40 meq via ORAL
  Filled 2023-11-02: qty 2

## 2023-11-02 MED ORDER — AMMONIUM LACTATE 12 % EX LOTN
TOPICAL_LOTION | Freq: Every day | CUTANEOUS | Status: DC
  Administered 2023-11-04 – 2023-11-05 (×2): 1 via TOPICAL
  Filled 2023-11-02: qty 400

## 2023-11-02 MED ORDER — FUROSEMIDE 10 MG/ML IJ SOLN: 40.0000 mg | Freq: Two times a day (BID) | INTRAMUSCULAR | Status: DC

## 2023-11-02 NOTE — Progress Notes (Signed)
Progress Note   Patient: Elizabeth Rowland ZOX:[id number] DOB: 08-11-1941 DOA: 10/24/2023     9 DOS: the patient was seen and examined on 11/02/2023        Brief hospital course:  83 y.o. female with medical history significant of PE and DVT on Eliquis, HTN, HDL, DM, COPD, dCHf, anxiety, obesity, lymphedema, who presents with shortness of breath.  Per report, patient was found to have severe respiratory distress, with oxygen desaturation to 50% on room air, initially started on CPAP with 85% of saturation, then BiPAP started in ED with improvement.    02/07: admitted to hospitalist service, needing BiPap support into early 02/09 02/09-02/10: remains on HHF [place] O2 support  02/11: really not weaning down on high flow, will get CT chest, based on exam suspect may have pleural effusion, eval for PE would be prudent though she has been compliant w/ eliquis she has hx      ASSESSMENT & PLAN:   Acute on chronic respiratory failure with hypoxia due to multifocal pneumonia:  Elevated lactic acid more likely due to hypoxia.  Sepsis RULED OUT RVP nothing viral detected Continue supportive O2, wean as tolerated Continue cefepime  Has completed doxycycline course Continue bronchodilators as needed, Follow urine for Legionella as well as strep pneumococcal antigen Follow-up on culture results Continue high flow nasal oxygenation I have reviewed chest x-ray showing extensive opacity bilaterally concerning for multifocal pneumonia Palliative care as well as pulmonologist on board and case discussed Continue steroid  Case discussed with pulmonologist at bedside today      COPD (chronic obstructive pulmonary disease) (HCC): Continue bronchodilators as as above   HLD (hyperlipidemia) Continue pravastatin   Diabetes mellitus without complication (HCC):  Recent A1c 8.1, at goal for age  Continue insulin therapy   Chronic diastolic CHF (congestive heart failure) (HCC) 2D echo on 01/08/2021  showed EF of 55 to 60% with grade 1 diastolic dysfunction.  Patient has had BMP 372, and chronic lateral leg lymphedema, cannot rule out CHF exacerbation. 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  Counseled on weight loss when medically stable   DVT prophylaxis: Eliquis   Code Status: DNI DNR   Barriers to dispo / significant pending items: respiratory status   Subjective  Seen and examined in the ICU today Patient still on high flow nasal oxygen currently at 95% FiO2 Denies nausea vomiting abdominal pain chest pain cough Family Communication: none at this time      Physical Exam Constitutional:      General: Patient in some respiratory distress on 95% high flow oxygen Cardiovascular:     Rate and Rhythm: Normal rate and regular rhythm.  Pulmonary: Decreased air entry bibasilarly bilaterally Neurological:     Mental Status: [name]. Mental status is at baseline.  Psychiatric:        Mood and Affect: Mood normal.        Behavior: Behavior normal.  Data reviewed:      Latest Ref Rng & Units 11/02/2023    2:43 AM 11/01/2023    3:16 AM 10/31/2023    6:45 AM  CBC  WBC 4.0 - 10.5 K/uL 28.4  31.5  28.7   Hemoglobin 12.0 - 15.0 g/dL 8.6  8.4  8.2   Hematocrit 36.0 - 46.0 % 31.2  29.0  29.5   Platelets 150 - 400 K/uL 228  237  184        Latest Ref Rng & Units 11/02/2023    2:43 AM 11/01/2023    3:16 AM 10/31/2023    6:45 AM  BMP  Glucose 70 - 99 mg/dL 098  119  147   BUN 8 - 23 mg/dL 50  38  27    Creatinine 0.44 - 1.00 mg/dL 8.29  5.62  1.30   Sodium 135 - 145 mmol/L 143  142  140   Potassium 3.5 - 5.1 mmol/L 3.4  3.1  3.6   Chloride 98 - 111 mmol/L 99  99  99   CO2 22 - 32 mmol/L 31  32  30   Calcium 8.9 - 10.3 mg/dL 8.7  8.6  8.3     Vitals:   11/02/23 1300 11/02/23 1316 11/02/23 1333 11/02/23 1443  BP: (!) 125/45     Pulse: (!) 102 (!) 104 (!) 102   Resp: (!) 23 19 (!) 26   Temp: 98.9 F (37.2 C)     TempSrc: Axillary     SpO2: (!) 79% (!) 83% (!) 87% (!) 86%  Weight:      Height:         Author: Loyce Dys, MD 11/02/2023 3:04 PM  For on call review [email or web address]

## 2023-11-02 NOTE — Progress Notes (Signed)
                                                                                                                                                                                                           Daily Progress Note   Patient Name: Elizabeth Rowland       Date: 11/02/2023 DOB: Sep 15, 1941  Age: 83 y.o. MRN#: [id number] Attending Physician: Loyce Dys, MD Primary Care Physician: Shane Crutch, [place] Admit Date: 10/24/2023  Reason for Consultation/Follow-up: Establishing goals of care  HPI/Brief Hospital Review: 83 y.o. female  with past medical history of DVT/PE on Eliquis, HTN, HLD, CHF and ILD admitted from home on 10/24/2023 with severe respiratory distress and significant hypoxia. Admitted and being treated for acute on chronic respiratory failure due to multifocal PNA.   During hospitalization has required intermittent bipap and HHFNC support   Palliative medicine was consulted for assisting with goals of care conversations.  Subjective: Extensive chart review has been completed prior to meeting patient including labs, vital signs, imaging, progress notes, orders, and available advanced directive documents from current and previous encounters.    Visited with Ms. Mayor at her bedside. She is more awake and alert today. She remains on HHFNC, NRB off and on but able to maintain goal saturations. She feels she is doing and feeling better today. She remains hopeful that she continues to feel better and can recover from this illness. No family at bedside during time of visit.  Pulse dose steroids completed today. Remains high risk for decompensation.  Answered and addressed all questions and concerns. PMT to continue to follow for ongoing needs and support.  Care plan was discussed with nursing staff  Thank you for allowing the Palliative Medicine Team to assist in the care of this patient.  Total time:  35 minutes  Time spent includes: Detailed review of medical records  (labs, imaging, vital signs), medically appropriate exam (mental status, respiratory, cardiac, skin), discussed with treatment team, counseling and educating patient, family and staff, documenting clinical information, medication management and coordination of care.  Leeanne Deed, DNP, AGNP-C Palliative Medicine   Please contact Palliative Medicine Team phone at [phone] for questions and concerns.

## 2023-11-02 NOTE — Progress Notes (Signed)
Pulmonology Progress Note   Patient: Elizabeth Rowland ZOX:[id number] DOB: May 24, 1941 DOA: 10/24/2023     9 DOS: the patient was seen and examined on 11/02/2023   Brief hospital course: .   11/02/23 -patient seen at bedside. Patient on 93%/55L HHFNC awake and alert but seems to be in NO distress.  She is using incentive spirometer at bedside and we worked on this together today she was able to take breath with tidal volumes over 500cc which is a small improvement from previous. Overall her prognosis remains poor and I appreciate palliative specialist.   She is s/p pulse dosing steroids with minor improvements and CRP improved in paralell.  I reviewed her ABG yesterday with persistent  hypercapnic hypoxemic failure.  She remains DNR/DNI.  She is verbalizing improvement and reports adequate analgesia.  She developed mild AKI and I'm holding lasix as of today. We will start reduced dose steroids with solumedrol weaning to 125mg  bid as of tomorrow.      Narrative & Impression  CLINICAL DATA:  Pneumonia, complication suspected, xray done Diffuse/interstitial lung disease   EXAM: CT CHEST WITH CONTRAST   TECHNIQUE: Multidetector CT imaging of the chest was performed during intravenous contrast administration.   RADIATION DOSE REDUCTION: This exam was performed according to the departmental dose-optimization program which includes automated exposure control, adjustment of the mA and/or kV according to patient size and/or use of iterative reconstruction technique.   CONTRAST:  75mL OMNIPAQUE IOHEXOL 300 MG/ML  SOLN   COMPARISON:  X-ray 10/24/2023, CT 11/29/2021   FINDINGS: Cardiovascular: Mild cardiomegaly. Trace pericardial fluid. Lipomatous hypertrophy of the interatrial septum. Thoracic aorta is nonaneurysmal. Atherosclerotic calcifications of the aorta and coronary arteries. Central pulmonary vasculature is mildly dilated.   Mediastinum/Nodes: Multiple enlarged mediastinal and right  hilar lymph nodes. Reference nodes include 1.6 cm lower right paratracheal node (series 2, image 49), 1.4 cm AP window node (series 2, image 53), 1.4 cm right hilar node (series 2, image 63). These nodes have all increased in size when compared to the prior CT from 2023. Several small left hilar lymph nodes are present. No axillary lymphadenopathy. 2.3 cm left thyroid lobe nodule. Trachea and esophagus within normal limits. Small hiatal hernia.   Lungs/Pleura: Extensive airspace opacity throughout both lungs involving all lobes, findings are slightly worse within the right lung. Background of interstitial lung disease, not well assessed given the degree of airspace consolidation. No pleural effusion or pneumothorax.   Upper Abdomen: No acute abnormality.   Musculoskeletal: No chest wall abnormality. No acute or significant osseous findings.   IMPRESSION: 1. Extensive airspace opacity throughout both lungs, slightly worse within the right lung. Findings are most suspicious for multifocal pneumonia. 2. Background of interstitial lung disease, not well assessed given the degree of airspace consolidation. Consider follow-up high-resolution chest CT on an outpatient basis following the resolution of patient's acute symptoms. 3. Mediastinal and right hilar lymphadenopathy, increased in size when compared to the prior CT from 2023. Findings are favored to be reactive. 4. Mild cardiomegaly with mild dilation of the central pulmonary vasculature, suggesting pulmonary arterial hypertension. 5. Aortic and coronary artery atherosclerosis (ICD10-I70.0). 6. Incidentally noted 2.3 cm left thyroid lobe nodule. Recommend thyroid US (ref: J Am Coll Radiol. 2015 Feb;12(2): 143-50).     Electronically Signed   By: Duanne Guess D.O.   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 -completed pulsed dose  steroids now  weaning protocol Elevated lactic acid has resolved  Sepsis RULED OUT RVP 10/26/2023:   negative Reviewed ABG with hypercapnic/hypoxemic resp failure, poor prognosis Legionella and strep pneumococcal antigen - Negative Blood Cultures Negative 10/30/2023 CT and chest x-ray concerning for multifocal pneumonia Continue supportive O2,  Currently on high flow [place] currently at FiO2 94%  - wean as tolerated - Continue IV steroids now down to solumedrol 125 bid IV Continue bronchodilators    Acute exacerbation of Interstitial Lung Disease: Bronchodilators - Albuterol Neb Q8H with Chest physiotherapy - MedaNeb when able Methylprednisone IV - pulse dose 1g x 3d Aggressive Incentive spirometry use with cough ABG serial performed and reviewed   Chronic normocytic anemai Goal of Hemoglobin > 8 - currently 7.4  transfusion of 1 unit of PRBC per hospital protocol    HLD (hyperlipidemia) Continue pravastatin   Diabetes mellitus without complication (HCC):  Recent A1c 8.1, at goal for age  Continue insulin therapy   Chronic diastolic CHF (congestive heart failure) (HCC) 2D echo on 01/08/2021 showed EF of 55 to 60% with grade 1 diastolic dysfunction.  Patient has had BMP 372, and chronic lateral leg lymphedema, cannot rule out CHF exacerbation. 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  Counseled on weight loss when medically stable    Code Status:DNR/DNI   Barriers to dispo / significant pending items: respiratory status   Family Communication: Family at bedside - Granddaughter and Son at bedside. Discussed Respiratory status and answered all  questions of concern    Physical Exam Constitutional:      General: She is not in acute distress. Cardiovascular:     Rate and Rhythm: Normal rate and regular rhythm.  Pulmonary: Air entry decreased bilaterally.  Wheezing in upper lobes and diminished bases.   Extremities:  Mild bilateral lower extremity edema.  Neurological:     Mental Status: [name]. Mental status is at baseline.  Psychiatric:        Mood and Affect: Mood normal.        Behavior: Behavior normal.     Vitals:   11/02/23 0700 11/02/23 0754 11/02/23 0800 11/02/23 0900  BP: 122/61  (!) 141/50 (!) 138/47  Pulse: 94 94 94 99  Resp: (!) 22 (!) 23 (!) 30 (!) 31  Temp:  98 F (36.7 C)    TempSrc:  Axillary    SpO2: (!) 81% (!) 83% (!) 88% 97%  Weight:      Height:        Data Reviewed:    Latest Ref Rng & Units 11/02/2023    2:43 AM 11/01/2023    3:16 AM 10/31/2023    6:45 AM  BMP  Glucose 70 - 99 mg/dL 409  811  914   BUN 8 - 23 mg/dL 50  38  27   Creatinine 0.44 - 1.00 mg/dL 7.82  9.56  2.13   Sodium 135 - 145 mmol/L 143  142  140   Potassium 3.5 - 5.1 mmol/L 3.4  3.1  3.6   Chloride 98 - 111 mmol/L  99  99  99   CO2 22 - 32 mmol/L 31  32  30   Calcium 8.9 - 10.3 mg/dL 8.7  8.6  8.3        Latest Ref Rng & Units 11/02/2023    2:43 AM 11/01/2023    3:16 AM 10/31/2023    6:45 AM  CBC  WBC 4.0 - 10.5 K/uL 28.4  31.5  28.7   Hemoglobin 12.0 - 15.0 g/dL 8.6  8.4  8.2   Hematocrit 36.0 - 46.0 % 31.2  29.0  29.5   Platelets 150 - 400 K/uL 228  237  184        Lab Results WBC  Date/Time Value Ref Range Status  11/02/2023 02:43 AM 28.4 (H) 4.0 - 10.5 K/uL Final  11/01/2023 03:16 AM 31.5 (H) 4.0 - 10.5 K/uL Final  10/31/2023 06:45 AM 28.7 (H) 4.0 - 10.5 K/uL Final   Neutrophils Relative %  Date/Time Value Ref Range Status  11/02/2023 02:43 AM 75 % Final  11/01/2023 03:16 AM 79 % Final  10/31/2023 06:45 AM 78 % Final   pCO2 arterial  Date/Time Value Ref Range Status  11/01/2023 03:52 PM 53  (H) 32 - 48 mmHg Final  10/31/2023 10:43 AM 48 32 - 48 mmHg Final  10/31/2023 04:22 AM 58 (H) 32 - 48 mmHg Final   Lactic Acid, Venous  Date/Time Value Ref Range Status  10/25/2023 07:42 PM 1.8 0.5 - 1.9 mmol/L Final    Comment:    Performed at [facility], [address] [place], [place] [address]  10/25/2023 12:18 AM 1.2 0.5 - 1.9 mmol/L Final    Comment:    Performed at [facility], [address] [place], [place] [address]  10/24/2023 06:15 PM 1.3 0.5 - 1.9 mmol/L Final    Comment:    Performed at [facility], [address] [place], [place] [address]   No results found for: "PCO2VEN"   Critical care provider statement:   Total critical care time: 33 minutes   Performed by: Karna Christmas MD   Critical care time was exclusive of separately billable procedures and treating other patients.   Critical care was necessary to treat or prevent imminent or life-threatening deterioration.   Critical care was time spent personally by me on the following activities: development of treatment plan with patient and/or surrogate as well as nursing, discussions with consultants, evaluation of patient's response to treatment, examination of patient, obtaining history from patient or surrogate, ordering and performing treatments and interventions, ordering and review of laboratory studies, ordering and review of radiographic studies, pulse oximetry and re-evaluation of patient's condition.    Vida Rigger, M.D.  Pulmonary & Critical Care Medicine      For on call review [email or web address].

## 2023-11-02 NOTE — Plan of Care (Signed)
  Problem: Coping: Goal: Ability to adjust to condition or change in health will improve Outcome: Progressing   Problem: Fluid Volume: Goal: Ability to maintain a balanced intake and output will improve Outcome: Progressing   Problem: Health Behavior/Discharge Planning: Goal: Ability to identify and utilize available resources and services will improve Outcome: Progressing Goal: Ability to manage health-related needs will improve Outcome: Progressing   Problem: Metabolic: Goal: Ability to maintain appropriate glucose levels will improve Outcome: Progressing   Problem: Nutritional: Goal: Maintenance of adequate nutrition will improve Outcome: Progressing Goal: Progress toward achieving an optimal weight will improve Outcome: Progressing   Problem: Skin Integrity: Goal: Risk for impaired skin integrity will decrease Outcome: Progressing   Problem: Skin Integrity: Goal: Risk for impaired skin integrity will decrease Outcome: Progressing   Problem: Tissue Perfusion: Goal: Adequacy of tissue perfusion will improve Outcome: Progressing   Problem: Education: Goal: Knowledge of General Education information will improve Description: Including pain rating scale, medication(s)/side effects and non-pharmacologic comfort measures Outcome: Progressing   Problem: Clinical Measurements: Goal: Ability to maintain clinical measurements within normal limits will improve Outcome: Progressing Goal: Will remain free from infection Outcome: Progressing Goal: Diagnostic test results will improve Outcome: Progressing Goal: Respiratory complications will improve Outcome: Progressing Goal: Cardiovascular complication will be avoided Outcome: Progressing   Problem: Activity: Goal: Risk for activity intolerance will decrease Outcome: Progressing   Problem: Nutrition: Goal: Adequate nutrition will be maintained Outcome: Progressing   Problem: Coping: Goal: Level of anxiety will  decrease Outcome: Progressing   Problem: Elimination: Goal: Will not experience complications related to bowel motility Outcome: Progressing Goal: Will not experience complications related to urinary retention Outcome: Progressing   Problem: Pain Managment: Goal: General experience of comfort will improve and/or be controlled Outcome: Progressing   Problem: Safety: Goal: Ability to remain free from injury will improve Outcome: Progressing   Problem: Skin Integrity: Goal: Risk for impaired skin integrity will decrease Outcome: Progressing   Problem: Education: Goal: Ability to demonstrate management of disease process will improve Outcome: Progressing Goal: Ability to verbalize understanding of medication therapies will improve Outcome: Progressing Goal: Individualized Educational Video(s) Outcome: Progressing   Problem: Activity: Goal: Capacity to carry out activities will improve Outcome: Progressing   Problem: Cardiac: Goal: Ability to achieve and maintain adequate cardiopulmonary perfusion will improve Outcome: Progressing   Problem: Education: Goal: Knowledge of disease or condition will improve Outcome: Progressing Goal: Knowledge of the prescribed therapeutic regimen will improve Outcome: Progressing   Problem: Activity: Goal: Ability to tolerate increased activity will improve Outcome: Progressing Goal: Will verbalize the importance of balancing activity with adequate rest periods Outcome: Progressing   Problem: Respiratory: Goal: Ability to maintain a clear airway will improve Outcome: Progressing Goal: Levels of oxygenation will improve Outcome: Progressing Goal: Ability to maintain adequate ventilation will improve Outcome: Progressing   Problem: Respiratory: Goal: Ability to maintain a clear airway will improve Outcome: Progressing Goal: Levels of oxygenation will improve Outcome: Progressing Goal: Ability to maintain adequate ventilation will  improve Outcome: Progressing   Problem: Activity: Goal: Ability to tolerate increased activity will improve Outcome: Progressing   Problem: Clinical Measurements: Goal: Ability to maintain a body temperature in the normal range will improve Outcome: Progressing   Problem: Respiratory: Goal: Ability to maintain adequate ventilation will improve Outcome: Progressing Goal: Ability to maintain a clear airway will improve Outcome: Progressing   Problem: Education: Goal: Knowledge of the prescribed therapeutic regimen will improve Outcome: Progressing

## 2023-11-03 DIAGNOSIS — Z515 Encounter for palliative care: Secondary | ICD-10-CM | POA: Diagnosis not present

## 2023-11-03 DIAGNOSIS — Z7189 Other specified counseling: Secondary | ICD-10-CM | POA: Diagnosis not present

## 2023-11-03 DIAGNOSIS — J189 Pneumonia, unspecified organism: Secondary | ICD-10-CM | POA: Diagnosis not present

## 2023-11-03 DIAGNOSIS — J9601 Acute respiratory failure with hypoxia: Secondary | ICD-10-CM | POA: Diagnosis not present

## 2023-11-03 DIAGNOSIS — Z66 Do not resuscitate: Secondary | ICD-10-CM

## 2023-11-03 LAB — BASIC METABOLIC PANEL
Anion gap: 10 (ref 5–15)
BUN: 52 mg/dL — ABNORMAL HIGH (ref 8–23)
CO2: 33 mmol/L — ABNORMAL HIGH (ref 22–32)
Calcium: 8.9 mg/dL (ref 8.9–10.3)
Chloride: 100 mmol/L (ref 98–111)
Creatinine, Ser: 0.95 mg/dL (ref 0.44–1.00)
GFR, Estimated: 59 mL/min — ABNORMAL LOW (ref >60)
Glucose, Bld: 196 mg/dL — ABNORMAL HIGH (ref 70–99)
Potassium: 4.2 mmol/L (ref 3.5–5.1)
Sodium: 143 mmol/L (ref 135–145)

## 2023-11-03 LAB — CBC WITH DIFFERENTIAL/PLATELET
Abs Immature Granulocytes: 1.08 10*3/uL — ABNORMAL HIGH (ref 0.00–0.07)
Basophils Absolute: 0 10*3/uL (ref 0.0–0.1)
Basophils Relative: 0 %
Eosinophils Absolute: 0.5 10*3/uL (ref 0.0–0.5)
Eosinophils Relative: 2 %
HCT: 29.4 % — ABNORMAL LOW (ref 36.0–46.0)
Hemoglobin: 8.4 g/dL — ABNORMAL LOW (ref 12.0–15.0)
Immature Granulocytes: 4 %
Lymphocytes Relative: 2 %
Lymphs Abs: 0.6 10*3/uL — ABNORMAL LOW (ref 0.7–4.0)
MCH: 22.8 pg — ABNORMAL LOW (ref 26.0–34.0)
MCHC: 28.6 g/dL — ABNORMAL LOW (ref 30.0–36.0)
MCV: 79.7 fL — ABNORMAL LOW (ref 80.0–100.0)
Monocytes Absolute: 3.6 10*3/uL — ABNORMAL HIGH (ref 0.1–1.0)
Monocytes Relative: 14 %
Neutro Abs: 20.2 10*3/uL — ABNORMAL HIGH (ref 1.7–7.7)
Neutrophils Relative %: 78 %
Platelets: 255 10*3/uL (ref 150–400)
RBC: 3.69 MIL/uL — ABNORMAL LOW (ref 3.87–5.11)
RDW: 20.5 % — ABNORMAL HIGH (ref 11.5–15.5)
Smear Review: NORMAL
WBC: 26 10*3/uL — ABNORMAL HIGH (ref 4.0–10.5)
nRBC: 0.7 % — ABNORMAL HIGH (ref 0.0–0.2)

## 2023-11-03 LAB — BLOOD GAS, ARTERIAL
Acid-Base Excess: 11.2 mmol/L — ABNORMAL HIGH (ref 0.0–2.0)
Bicarbonate: 37 mmol/L — ABNORMAL HIGH (ref 20.0–28.0)
Delivery systems: POSITIVE
Expiratory PAP: 5 cm[H2O]
FIO2: 100 %
Inspiratory PAP: 12 cm[H2O]
O2 Saturation: 87.2 %
Patient temperature: 37
RATE: 12 {breaths}/min
pCO2 arterial: 52 mm[Hg] — ABNORMAL HIGH (ref 32–48)
pH, Arterial: 7.46 — ABNORMAL HIGH (ref 7.35–7.45)
pO2, Arterial: 57 mm[Hg] — ABNORMAL LOW (ref 83–108)

## 2023-11-03 LAB — GLUCOSE, CAPILLARY
Glucose-Capillary: 190 mg/dL — ABNORMAL HIGH (ref 70–99)
Glucose-Capillary: 146 mg/dL — ABNORMAL HIGH (ref 70–99)
Glucose-Capillary: 261 mg/dL — ABNORMAL HIGH (ref 70–99)
Glucose-Capillary: 166 mg/dL — ABNORMAL HIGH (ref 70–99)

## 2023-11-03 LAB — C-REACTIVE PROTEIN: CRP: 1.8 mg/dL — ABNORMAL HIGH (ref <1.0)

## 2023-11-03 MED ORDER — IPRATROPIUM-ALBUTEROL 0.5-2.5 (3) MG/3ML IN SOLN: 3.0000 mL | Freq: Once | RESPIRATORY_TRACT | Status: AC

## 2023-11-03 MED ORDER — IPRATROPIUM-ALBUTEROL 0.5-2.5 (3) MG/3ML IN SOLN
RESPIRATORY_TRACT | Status: AC
  Administered 2023-11-03: 3 mL via RESPIRATORY_TRACT
  Filled 2023-11-03: qty 3

## 2023-11-03 NOTE — Progress Notes (Signed)
Progress Note   Patient: Elizabeth Rowland [id number]:[id number] DOB: [birth year] DOA: 10/24/2023     10 DOS: the patient was seen and examined on 11/03/2023     Brief hospital course:  83 y.o. female with medical history significant of PE and DVT on Eliquis, HTN, HDL, DM, COPD, dCHf, anxiety, obesity, lymphedema, who presents with shortness of breath.  Per report, patient was found to have severe respiratory distress, with oxygen desaturation to 50% on room air, initially started on CPAP with 85% of saturation, then BiPAP started in ED with improvement.    02/07: admitted to hospitalist service, needing BiPap support into early 02/09 02/09-02/10: remains on HHF [place] O2 support  02/11: really not weaning down on high flow, will get CT chest, based on exam suspect may have pleural effusion, eval for PE would be prudent though she has been compliant w/ eliquis she has hx      ASSESSMENT & PLAN:   Acute on chronic respiratory failure with hypoxia due to interstitial lung disease flare as well as possible multifocal pneumonia Elevated lactic acid more likely due to hypoxia.  Sepsis RULED OUT RVP nothing viral detected Continue supportive O2, wean as tolerated Has completed a course of antibiotics including doxycycline and cefepime Continue bronchodilators as needed, Continue high flow nasal oxygenation Legionella and strep antigen negative Chest CT scan showing extensive infiltrate Continue steroid therapy as recommended by pulmonologist Case discussed with pulmonologist at bedside today Palliative care on board and at this point patient is not ready to transition to comfort care Continue high flow oxygen, pulmonologist have agreed with a target saturation above 80      COPD (chronic obstructive pulmonary disease) (HCC): Continue bronchodilators as as above   HLD (hyperlipidemia) Continue pravastatin   Diabetes mellitus without complication (HCC):  Recent A1c 8.1, at goal for age   Continue insulin therapy   Chronic diastolic CHF (congestive heart failure) (HCC) 2D echo on 01/08/2021 showed EF of 55 to 60% with grade 1 diastolic dysfunction.  Patient has had BMP 372, and chronic lateral leg lymphedema, cannot rule out CHF exacerbation. 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  Counseled on weight loss when medically stable   DVT prophylaxis: Eliquis   Code Status: DNI DNR   Barriers to dispo / significant pending items: respiratory status   Subjective  Patient seen and examined at bedside this morning Requiring 98% FiO2 via high flow nasal oxygen Does not admit to any significant improvement since yesterday Denies chest pain nausea vomiting   Family Communication: none at this time      Physical Exam Constitutional:      General: Patient in some respiratory distress on 95% high flow oxygen Cardiovascular:     Rate and Rhythm: Normal rate and regular rhythm.  Pulmonary: Decreased air entry bibasilarly bilaterally Neurological:     Mental Status: [name]. Mental status is at baseline.  Psychiatric:  Mood and Affect: Mood normal.        Behavior: Behavior normal.        Data reviewed:    Latest Ref Rng & Units 11/03/2023    4:39 AM 11/02/2023    2:43 AM 11/01/2023    3:16 AM  BMP  Glucose 70 - 99 mg/dL 161  096  045   BUN 8 - 23 mg/dL 52  50  38   Creatinine 0.44 - 1.00 mg/dL 4.09  8.11  9.14   Sodium 135 - 145 mmol/L 143  143  142   Potassium 3.5 - 5.1 mmol/L 4.2  3.4  3.1    Chloride 98 - 111 mmol/L 100  99  99   CO2 22 - 32 mmol/L 33  31  32   Calcium 8.9 - 10.3 mg/dL 8.9  8.7  8.6     Vitals:   11/03/23 1500 11/03/23 1600 11/03/23 1700 11/03/23 1800  BP: (!) 132/37 130/69 (!) 154/55 (!) 143/57  Pulse: (!) 106 (!) 101 (!) 101 94  Resp: (!) 24 20 (!) 34 (!) 30  Temp:  98.6 F (37 C)    TempSrc:  Axillary    SpO2: (!) 82% (!) 86% (!) 80% (!) 86%  Weight:      Height:          Latest Ref Rng & Units 11/03/2023    4:39 AM 11/02/2023    2:43 AM 11/01/2023    3:16 AM  CBC  WBC 4.0 - 10.5 K/uL 26.0  28.4  31.5   Hemoglobin 12.0 - 15.0 g/dL 8.4  8.6  8.4   Hematocrit 36.0 - 46.0 % 29.4  31.2  29.0   Platelets 150 - 400 K/uL 255  228  237      Author: Loyce Dys, MD 11/03/2023 6:38 PM  For on call review [email or web address].

## 2023-11-03 NOTE — Progress Notes (Signed)
Pulmonology Progress Note   Patient: Elizabeth Rowland:[id number] DOB: [birth year] DOA: 10/24/2023     10 DOS: the patient was seen and examined on 11/03/2023   Brief hospital course:    11/03/23 patient remains on 100% fio2 without much improvement despite maximal medical management. She relates feeling ill and is able to communicate sharing feeling unwell with discomfort.  Her O2 has not improved despite adequate diuresis and improved inflammatory biomarkers. She has poor prognosis and is at risk of loss of life. She has palliative care following and is appropriate for hospice/comfort care.      Narrative & Impression  CLINICAL DATA:  Pneumonia, complication suspected, xray done Diffuse/interstitial lung disease   EXAM: CT CHEST WITH CONTRAST   TECHNIQUE: Multidetector CT imaging of the chest was performed during intravenous contrast administration.   RADIATION DOSE REDUCTION: This exam was performed according to the departmental dose-optimization program which includes automated exposure control, adjustment of the mA and/or kV according to patient size and/or use of iterative reconstruction technique.   CONTRAST:  75mL OMNIPAQUE IOHEXOL 300 MG/ML  SOLN   COMPARISON:  X-ray 10/24/2023, CT 11/29/2021   FINDINGS: Cardiovascular: Mild cardiomegaly. Trace pericardial fluid. Lipomatous hypertrophy of the interatrial septum. Thoracic aorta is nonaneurysmal. Atherosclerotic calcifications of the aorta and coronary arteries. Central pulmonary vasculature is mildly dilated.   Mediastinum/Nodes: Multiple enlarged mediastinal and right hilar lymph nodes. Reference nodes include 1.6 cm lower right paratracheal node (series 2, image 49), 1.4 cm AP window node (series 2, image 53), 1.4 cm right hilar node (series 2, image 63). These nodes have all increased in size when compared to the prior CT from 2023. Several small left hilar lymph nodes are present. No  axillary lymphadenopathy. 2.3 cm left thyroid lobe nodule. Trachea and esophagus within normal limits. Small hiatal hernia.   Lungs/Pleura: Extensive airspace opacity throughout both lungs involving all lobes, findings are slightly worse within the right lung. Background of interstitial lung disease, not well assessed given the degree of airspace consolidation. No pleural effusion or pneumothorax.   Upper Abdomen: No acute abnormality.   Musculoskeletal: No chest wall abnormality. No acute or significant osseous findings.   IMPRESSION: 1. Extensive airspace opacity throughout both lungs, slightly worse within the right lung. Findings are most suspicious for multifocal pneumonia. 2. Background of interstitial lung disease, not well assessed given the degree of airspace consolidation. Consider follow-up high-resolution chest CT on an outpatient basis following the resolution of patient's acute symptoms. 3. Mediastinal and right hilar lymphadenopathy, increased in size when compared to the prior CT from 2023. Findings are favored to be reactive. 4. Mild cardiomegaly with mild dilation of the central pulmonary vasculature, suggesting pulmonary arterial hypertension. 5. Aortic and coronary artery atherosclerosis (ICD10-I70.0). 6. Incidentally noted 2.3 cm left thyroid lobe nodule. Recommend thyroid US (ref: J Am Coll Radiol. 2015 Feb;12(2): 143-50).     Electronically Signed   By: Duanne Guess D.O.   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 -completed pulsed dose steroids now weaning protocol Elevated lactic acid has resolved  Sepsis RULED OUT RVP 10/26/2023:   negative Reviewed ABG with hypercapnic/hypoxemic resp failure, poor prognosis Legionella and strep pneumococcal antigen - Negative Blood Cultures Negative 10/30/2023 CT and chest x-ray concerning for multifocal pneumonia Continue supportive  O2,  Currently on high flow [place] currently at FiO2 94%  - wean as tolerated - Continue IV  steroids now down to solumedrol 125 bid IV Continue bronchodilators    Acute exacerbation of Interstitial Lung Disease: Bronchodilators - Albuterol Neb Q8H with Chest physiotherapy - MedaNeb when able Methylprednisone IV - pulse dose 1g x 3d Aggressive Incentive spirometry use with cough ABG serial performed and reviewed   Chronic normocytic anemai Goal of Hemoglobin > 8 - currently 7.4  transfusion of 1 unit of PRBC per hospital protocol    HLD (hyperlipidemia) Continue pravastatin   Diabetes mellitus without complication (HCC):  Recent A1c 8.1, at goal for age  Continue insulin therapy   Chronic diastolic CHF (congestive heart failure) (HCC) 2D echo on 01/08/2021 showed EF of 55 to 60% with grade 1 diastolic dysfunction.  Patient has had BMP 372, and chronic lateral leg lymphedema, cannot rule out CHF exacerbation. 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  Counseled on weight loss when medically stable    Code Status:DNR/DNI   Barriers to dispo / significant pending items: respiratory status   Family Communication: Family at bedside - Granddaughter and Son at bedside. Discussed Respiratory status and answered all questions of concern    Physical Exam Constitutional:      General: She is not in acute distress. Cardiovascular:     Rate and Rhythm: Normal rate and regular rhythm.  Pulmonary: Air entry decreased bilaterally.  Wheezing in upper lobes and diminished bases.   Extremities:  Mild bilateral lower extremity edema.   Neurological:     Mental Status: [name]. Mental status is at baseline.  Psychiatric:        Mood and Affect: Mood normal.        Behavior: Behavior normal.     Vitals:   11/03/23 1000 11/03/23 1100 11/03/23 1130 11/03/23 1200  BP: (!) 144/56 (!) 135/56  117/88  Pulse: 88 91  89  Resp: (!) 22 (!) 21  (!) 23  Temp:   98.3 F (36.8 C)   TempSrc:   Axillary   SpO2: 90% 90%  (!) 88%  Weight:      Height:        Data Reviewed:    Latest Ref Rng & Units 11/03/2023    4:39 AM 11/02/2023    2:43 AM 11/01/2023    3:16 AM  BMP  Glucose 70 - 99 mg/dL 161  096  045   BUN 8 - 23 mg/dL 52  50  38   Creatinine 0.44 - 1.00 mg/dL 4.09  8.11  9.14   Sodium 135 - 145 mmol/L 143  143  142   Potassium 3.5 - 5.1 mmol/L 4.2  3.4  3.1   Chloride 98 - 111 mmol/L 100  99  99   CO2 22 - 32 mmol/L 33  31  32   Calcium 8.9 - 10.3 mg/dL 8.9  8.7  8.6        Latest Ref Rng & Units 11/03/2023    4:39 AM 11/02/2023    2:43 AM 11/01/2023    3:16 AM  CBC  WBC 4.0 - 10.5 K/uL 26.0  28.4  31.5   Hemoglobin 12.0 - 15.0 g/dL 8.4  8.6  8.4   Hematocrit 36.0 - 46.0 % 29.4  31.2  29.0   Platelets 150 - 400 K/uL 255  228  237        Lab Results WBC  Date/Time Value Ref Range Status  11/03/2023 04:39 AM 26.0 (H) 4.0 - 10.5 K/uL Final  11/02/2023 02:43 AM 28.4 (H) 4.0 - 10.5 K/uL Final  11/01/2023 03:16 AM 31.5 (H) 4.0 - 10.5 K/uL Final   Neutrophils Relative %  Date/Time Value Ref Range Status  11/03/2023 04:39 AM 78 % Final  11/02/2023 02:43 AM 75 % Final  11/01/2023 03:16 AM 79 % Final   pCO2 arterial  Date/Time Value Ref Range Status  11/03/2023 06:08 AM 52 (H) 32 - 48 mmHg Final  11/01/2023 03:52 PM 53 (H) 32 - 48 mmHg Final  10/31/2023 10:43 AM 48 32 - 48 mmHg Final   Lactic Acid, Venous  Date/Time Value Ref Range Status  10/25/2023 07:42 PM 1.8 0.5 - 1.9 mmol/L Final    Comment:    Performed at [facility], [address] [place], [place] [address]  10/25/2023 12:18  AM 1.2 0.5 - 1.9 mmol/L Final    Comment:    Performed at [facility], [address] [place], [place] [address]  10/24/2023 06:15 PM 1.3 0.5 - 1.9 mmol/L Final    Comment:    Performed at [facility], [address] [place], [place] [address]   No results found for: "PCO2VEN"   Critical care provider statement:   Total critical care time: 33 minutes   Performed by: Karna Christmas MD   Critical care time was exclusive of separately billable procedures and treating other patients.   Critical care was necessary to treat or prevent imminent or life-threatening deterioration.   Critical care was time spent personally by me on the following activities: development of treatment plan with patient and/or surrogate as well as nursing, discussions with consultants, evaluation of patient's response to treatment, examination of patient, obtaining history from patient or surrogate, ordering and performing treatments and interventions, ordering and review of laboratory studies, ordering and review of radiographic studies, pulse oximetry and re-evaluation of patient's condition.    Vida Rigger, M.D.  Pulmonary & Critical Care Medicine      For on call review [email or web address].

## 2023-11-03 NOTE — Plan of Care (Signed)
  Problem: Coping: Goal: Ability to adjust to condition or change in health will improve Outcome: Progressing   Problem: Fluid Volume: Goal: Ability to maintain a balanced intake and output will improve Outcome: Progressing   Problem: Nutritional: Goal: Maintenance of adequate nutrition will improve Outcome: Progressing   Problem: Skin Integrity: Goal: Risk for impaired skin integrity will decrease Outcome: Progressing   Problem: Tissue Perfusion: Goal: Adequacy of tissue perfusion will improve Outcome: Progressing   Problem: Education: Goal: Knowledge of General Education information will improve Description: Including pain rating scale, medication(s)/side effects and non-pharmacologic comfort measures Outcome: Progressing   Problem: Clinical Measurements: Goal: Cardiovascular complication will be avoided Outcome: Progressing   Problem: Elimination: Goal: Will not experience complications related to urinary retention Outcome: Progressing   Problem: Safety: Goal: Ability to remain free from injury will improve Outcome: Progressing   Problem: Education: Goal: Knowledge of disease or condition will improve Outcome: Progressing   Problem: Education: Goal: Knowledge of the prescribed therapeutic regimen will improve Outcome: Progressing   Problem: Coping: Goal: Ability to identify and develop effective coping behavior will improve Outcome: Progressing   Problem: Role Relationship: Goal: Family's ability to cope with current situation will improve Outcome: Progressing Goal: Ability to verbalize concerns, feelings, and thoughts to partner or family member will improve Outcome: Progressing   Problem: Pain Management: Goal: Satisfaction with pain management regimen will improve Outcome: Progressing

## 2023-11-03 NOTE — Plan of Care (Signed)
  Problem: Fluid Volume: Goal: Ability to maintain a balanced intake and output will improve Outcome: Progressing   Problem: Health Behavior/Discharge Planning: Goal: Ability to manage health-related needs will improve Outcome: Progressing   Problem: Skin Integrity: Goal: Risk for impaired skin integrity will decrease Outcome: Progressing   Problem: Elimination: Goal: Will not experience complications related to bowel motility Outcome: Progressing   Problem: Clinical Measurements: Goal: Respiratory complications will improve Outcome: Not Progressing   Problem: Activity: Goal: Risk for activity intolerance will decrease Outcome: Not Progressing

## 2023-11-03 NOTE — Progress Notes (Signed)
                                                                                                                                                                                                           Daily Progress Note   Patient Name: Elizabeth Rowland       Date: 11/03/2023 DOB: 1941/08/25  Age: 83 y.o. MRN#: [id number] Attending Physician: Loyce Dys, MD Primary Care Physician: Shane Crutch, [place] Admit Date: 10/24/2023  Reason for Consultation/Follow-up: Establishing goals of care  HPI/Brief Hospital Review: 83 y.o. female  with past medical history of DVT/PE on Eliquis, HTN, HLD, CHF and ILD admitted from home on 10/24/2023 with severe respiratory distress and significant hypoxia. Admitted and being treated for acute on chronic respiratory failure due to multifocal PNA.   During hospitalization has required intermittent bipap and HHFNC support   Palliative medicine was consulted for assisting with goals of care conversations.  Subjective: Extensive chart review has been completed prior to meeting patient including labs, vital signs, imaging, progress notes, orders, and available advanced directive documents from current and previous encounters.    Checked in with RN - reports saturations have been better today. Was on bipap overnight but now ow HFNC. No other concerns. Visited with Ms. Baena at her bedside. No family at bedside. She is sleeping upon my arrival but wakes easily to gentle touch. Her only complaint is that she is hot and thirsty - adjusted environment and provided ice water.  She continues to state she feels she is doing better. She remains hopeful that she continues to feel better and can recover from this illness. She would like to continue current care and allow time for outcomes. I offered to call her son however she declines.   Remains high risk for decompensation.  Answered and addressed all questions and concerns. PMT to continue to follow for ongoing needs  and support.  Care plan was discussed with nursing staff  Thank you for allowing the Palliative Medicine Team to assist in the care of this patient.  Total time:  30 minutes  Time spent includes: Detailed review of medical records (labs, imaging, vital signs), medically appropriate exam (mental status, respiratory, cardiac, skin), discussed with treatment team, counseling and educating patient, family and staff, documenting clinical information, medication management and coordination of care.  [name], DNP, AGNP-C Palliative Medicine Team Team Phone # [phone]  Pager # [phone]    Please contact Palliative Medicine Team phone at [phone] for questions and concerns.

## 2023-11-04 DIAGNOSIS — J9601 Acute respiratory failure with hypoxia: Secondary | ICD-10-CM | POA: Diagnosis not present

## 2023-11-04 DIAGNOSIS — Z515 Encounter for palliative care: Secondary | ICD-10-CM | POA: Diagnosis not present

## 2023-11-04 DIAGNOSIS — J189 Pneumonia, unspecified organism: Secondary | ICD-10-CM | POA: Diagnosis not present

## 2023-11-04 DIAGNOSIS — Z7189 Other specified counseling: Secondary | ICD-10-CM | POA: Diagnosis not present

## 2023-11-04 LAB — BASIC METABOLIC PANEL
Anion gap: 9 (ref 5–15)
BUN: 51 mg/dL — ABNORMAL HIGH (ref 8–23)
CO2: 33 mmol/L — ABNORMAL HIGH (ref 22–32)
Calcium: 9 mg/dL (ref 8.9–10.3)
Chloride: 100 mmol/L (ref 98–111)
Creatinine, Ser: 0.84 mg/dL (ref 0.44–1.00)
GFR, Estimated: >60 mL/min (ref >60)
Glucose, Bld: 195 mg/dL — ABNORMAL HIGH (ref 70–99)
Potassium: 4.3 mmol/L (ref 3.5–5.1)
Sodium: 142 mmol/L (ref 135–145)

## 2023-11-04 LAB — BLOOD GAS, VENOUS
Acid-Base Excess: 12.4 mmol/L — ABNORMAL HIGH (ref 0.0–2.0)
Bicarbonate: 38 mmol/L — ABNORMAL HIGH (ref 20.0–28.0)
O2 Saturation: 83.3 %
Patient temperature: 37
pCO2, Ven: 51 mm[Hg] (ref 44–60)
pH, Ven: 7.48 — ABNORMAL HIGH (ref 7.25–7.43)
pO2, Ven: 54 mm[Hg] — ABNORMAL HIGH (ref 32–45)

## 2023-11-04 LAB — CBC WITH DIFFERENTIAL/PLATELET
Abs Immature Granulocytes: 1.31 10*3/uL — ABNORMAL HIGH (ref 0.00–0.07)
Basophils Absolute: 0.1 10*3/uL (ref 0.0–0.1)
Basophils Relative: 0 %
Eosinophils Absolute: 0.1 10*3/uL (ref 0.0–0.5)
Eosinophils Relative: 1 %
HCT: 30.8 % — ABNORMAL LOW (ref 36.0–46.0)
Hemoglobin: 8.5 g/dL — ABNORMAL LOW (ref 12.0–15.0)
Immature Granulocytes: 5 %
Lymphocytes Relative: 5 %
Lymphs Abs: 1.3 10*3/uL (ref 0.7–4.0)
MCH: 22.5 pg — ABNORMAL LOW (ref 26.0–34.0)
MCHC: 27.6 g/dL — ABNORMAL LOW (ref 30.0–36.0)
MCV: 81.7 fL (ref 80.0–100.0)
Monocytes Absolute: 3.5 10*3/uL — ABNORMAL HIGH (ref 0.1–1.0)
Monocytes Relative: 12 %
Neutro Abs: 22.5 10*3/uL — ABNORMAL HIGH (ref 1.7–7.7)
Neutrophils Relative %: 77 %
Platelets: 278 10*3/uL (ref 150–400)
RBC: 3.77 MIL/uL — ABNORMAL LOW (ref 3.87–5.11)
RDW: 20.5 % — ABNORMAL HIGH (ref 11.5–15.5)
Smear Review: NORMAL
WBC: 28.8 10*3/uL — ABNORMAL HIGH (ref 4.0–10.5)
nRBC: 0.6 % — ABNORMAL HIGH (ref 0.0–0.2)

## 2023-11-04 LAB — C-REACTIVE PROTEIN: CRP: 1 mg/dL — ABNORMAL HIGH (ref <1.0)

## 2023-11-04 LAB — GLUCOSE, CAPILLARY
Glucose-Capillary: 173 mg/dL — ABNORMAL HIGH (ref 70–99)
Glucose-Capillary: 176 mg/dL — ABNORMAL HIGH (ref 70–99)
Glucose-Capillary: 240 mg/dL — ABNORMAL HIGH (ref 70–99)
Glucose-Capillary: 206 mg/dL — ABNORMAL HIGH (ref 70–99)

## 2023-11-04 MED ORDER — ZIPRASIDONE HCL 20 MG PO CAPS
20.0000 mg | ORAL_CAPSULE | Freq: Two times a day (BID) | ORAL | Status: DC
  Administered 2023-11-04 – 2023-11-07 (×6): 20 mg via ORAL
  Filled 2023-11-04 (×9): qty 1

## 2023-11-04 MED ORDER — ACETAMINOPHEN 325 MG PO TABS: 650.0000 mg | ORAL_TABLET | Freq: Three times a day (TID) | ORAL | Status: DC

## 2023-11-04 MED ORDER — METHYLPREDNISOLONE SODIUM SUCC 125 MG IJ SOLR
80.0000 mg | Freq: Every day | INTRAMUSCULAR | Status: DC
  Administered 2023-11-05 – 2023-11-06 (×2): 80 mg via INTRAVENOUS
  Filled 2023-11-04 (×2): qty 2

## 2023-11-04 NOTE — Progress Notes (Signed)
Progress Note   Patient: Elizabeth Rowland ZOX:[id number] DOB: 02-07-1941 DOA: 10/24/2023     11 DOS: the patient was seen and examined on 11/04/2023  Brief hospital course:  83 y.o. female with medical history significant of PE and DVT on Eliquis, HTN, HDL, DM, COPD, dCHf, anxiety, obesity, lymphedema, who presents with shortness of breath.  Per report, patient was found to have severe respiratory distress, with oxygen desaturation to 50% on room air, initially started on CPAP with 85% of saturation, then BiPAP started in ED with improvement.    02/07: admitted to hospitalist service, needing BiPap support into early 02/09 02/09-02/10: remains on HHF [place] O2 support  02/11: really not weaning down on high flow, will get CT chest, based on exam suspect may have pleural effusion, eval for PE would be prudent though she has been compliant w/ eliquis she has hx      ASSESSMENT & PLAN:   Acute on chronic respiratory failure with hypoxia due to interstitial lung disease flare as well as possible multifocal pneumonia Elevated lactic acid more likely due to hypoxia.  Sepsis RULED OUT RVP nothing viral detected Continue supportive O2, wean as tolerated Has completed a course of antibiotics including doxycycline and cefepime Continue bronchodilators as needed, Continue high flow nasal oxygenation Legionella and strep antigen negative Chest CT scan showing extensive infiltrate Continue steroid therapy as recommended by pulmonologist Case discussed with pulmonologist at bedside today Palliative care on board and at this point patient is not ready to transition to comfort care Continue high flow oxygen, pulmonologist have agreed with a target saturation above 80      COPD (chronic obstructive pulmonary disease) (HCC): Continue bronchodilators as as above   HLD (hyperlipidemia) Continue pravastatin   Diabetes mellitus without complication (HCC):  Recent A1c 8.1, at goal for age  Continue  insulin therapy   Chronic diastolic CHF (congestive heart failure) (HCC) 2D echo on 01/08/2021 showed EF of 55 to 60% with grade 1 diastolic dysfunction.  Patient has had BMP 372, and chronic lateral leg lymphedema, cannot rule out CHF exacerbation. 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  Counseled on weight loss when medically stable   DVT prophylaxis: Eliquis   Code Status: DNI DNR   Barriers to dispo / significant pending items: respiratory status   Subjective  Patient seen and examined at bedside this morning Still requiring 98% high flow oxygen He still continues to engage palliative care with goals of care discussion Denies abdominal pain or chest pain   Family Communication: none at this time      Physical Exam Constitutional:      General: Patient in some respiratory distress on 98% high flow oxygen Cardiovascular:     Rate and Rhythm: Normal rate and regular rhythm.  Pulmonary: Decreased air entry bibasilarly bilaterally Neurological:     Mental Status: [name]. Mental status is at baseline.  Psychiatric:  Mood and Affect: Mood normal.        Behavior: Behavior normal.        Data reviewed:    Latest Ref Rng & Units 11/04/2023    3:40 AM 11/03/2023    4:39 AM 11/02/2023    2:43 AM  CBC  WBC 4.0 - 10.5 K/uL 28.8  26.0  28.4   Hemoglobin 12.0 - 15.0 g/dL 8.5  8.4  8.6   Hematocrit 36.0 - 46.0 % 30.8  29.4  31.2   Platelets 150 - 400 K/uL 278  255  228        Latest Ref Rng & Units  11/04/2023    3:40 AM 11/03/2023    4:39 AM 11/02/2023    2:43 AM  BMP  Glucose 70 - 99 mg/dL 147  829  562   BUN 8 - 23 mg/dL 51  52  50   Creatinine 0.44 - 1.00 mg/dL 1.30  8.65  7.84   Sodium 135 - 145 mmol/L 142  143  143   Potassium 3.5 - 5.1 mmol/L 4.3  4.2  3.4   Chloride 98 - 111 mmol/L 100  100  99   CO2 22 - 32 mmol/L 33  33  31   Calcium 8.9 - 10.3 mg/dL 9.0  8.9  8.7      Vitals:   11/04/23 1119 11/04/23 1200 11/04/23 1309 11/04/23 1517  BP:  (!) 129/57    Pulse:  97    Resp:  (!) 22    Temp: 98.6 F (37 C)   98.7 F (37.1 C)  TempSrc: Axillary   Axillary  SpO2:  (!) 87% (!) 84%   Weight:      Height:       Disposition: Palliative care still engaging family and patient in discussion of goals of care with possible hopes of transitioning to comfort care measures  Author: Loyce Dys, MD 11/04/2023 3:25 PM  For on call review [email or web address].

## 2023-11-04 NOTE — Plan of Care (Signed)
  Problem: Fluid Volume: Goal: Ability to maintain a balanced intake and output will improve Outcome: Progressing   Problem: Skin Integrity: Goal: Risk for impaired skin integrity will decrease Outcome: Progressing   Problem: Tissue Perfusion: Goal: Adequacy of tissue perfusion will improve Outcome: Progressing   Problem: Clinical Measurements: Goal: Cardiovascular complication will be avoided Outcome: Progressing   Problem: Pain Managment: Goal: General experience of comfort will improve and/or be controlled Outcome: Progressing   Problem: Safety: Goal: Ability to remain free from injury will improve Outcome: Progressing   Problem: Clinical Measurements: Goal: Ability to maintain a body temperature in the normal range will improve Outcome: Progressing   Problem: Role Relationship: Goal: Family's ability to cope with current situation will improve Outcome: Progressing   Problem: Pain Management: Goal: Satisfaction with pain management regimen will improve Outcome: Progressing

## 2023-11-04 NOTE — Progress Notes (Signed)
Daily Progress Note   Patient Name: Elizabeth Rowland       Date: 11/04/2023 DOB: 28-Feb-1941  Age: 83 y.o. MRN#: [id number] Attending Physician: Loyce Dys, MD Primary Care Physician: Shane Crutch, [place] Admit Date: 10/24/2023  Reason for Consultation/Follow-up: Establishing goals of care  HPI/Brief Hospital Review: 83 y.o. female  with past medical history of DVT/PE on Eliquis, HTN, HLD, CHF and ILD admitted from home on 10/24/2023 with severe respiratory distress and significant hypoxia. Admitted and being treated for acute on chronic respiratory failure due to multifocal PNA.   During hospitalization has required intermittent bipap and HHFNC support   Palliative medicine was consulted for assisting with goals of care conversations.  Subjective: Extensive chart review has been completed prior to meeting patient including labs, vital signs, imaging, progress notes, orders, and available advanced directive documents from current and previous encounters.    Checked in with RN - RN reports patient did not have a good night; more anxious today and desats very easily. Visited with Elizabeth Rowland at her bedside. No family at bedside. She is awake and seems somewhat anxious. She also confirms she is not feeling well and did not have a good night. I attempt speaking about her care and how the situation does not seem to be improving, possibly worsening. Elizabeth Rowland turns her head away from me and tells me she doesn't want to talk about it. I share my concern that we want to make sure she understands the situation so we can make sure we understand what is most important to her. I ask her about speaking with her son to which she initially declines saying she doesn't want to worry him. I share with her that  it is important he understand the situation as well and she nods. I attempted to call son - no answer, voicemail left. Checked back in room multiple times throughout day and no family at bedside. RN reports no family has visited today. Will request PMT member follow up again soon for ongoing attempts at [facility] discussions.   Remains high risk for decompensation.  Care plan was discussed with nursing staff and medical team  Thank you for allowing the Palliative Medicine Team to assist in the care of this patient.  Total time:  30 minutes  Time spent includes: Detailed review  of medical records (labs, imaging, vital signs), medically appropriate exam (mental status, respiratory, cardiac, skin), discussed with treatment team, counseling and educating patient, family and staff, documenting clinical information, medication management and coordination of care.  [name], DNP, AGNP-C Palliative Medicine Team Team Phone # [phone]  Pager # [phone]    Please contact Palliative Medicine Team phone at [phone] for questions and concerns.

## 2023-11-04 NOTE — TOC Progression Note (Signed)
Transition of Care [facility]) - Progression Note    Patient Details  Name: Elizabeth Rowland MRN: [id number] Date of Birth: [birth year]  Transition of Care [facility]) CM/SW Contact  Allena Katz, LCSW Phone Number: 11/04/2023, 2:15 PM  Clinical Narrative:    Pt still on 60L. TOC following.        Expected Discharge Plan and Services                                               Social Determinants of Health (SDOH) Interventions SDOH Screenings   Tobacco Use: Low Risk  (10/24/2023)    Readmission Risk Interventions     No data to display

## 2023-11-04 NOTE — Progress Notes (Signed)
Pulmonology Progress Note   Patient: Elizabeth Rowland ZOX:[id number] DOB: Feb 13, 1941 DOA: 10/24/2023     11 DOS: the patient was seen and examined on 11/04/2023   Brief hospital course:    11/04/23 patient remains on 100% fio2 without much improvement despite maximal medical management. She was able to speak with me this morning but was desaturating to 84%.  She relates feeling ill and is able to communicate sharing feeling unwell with discomfort.  Her O2 has not improved despite adequate diuresis and improved inflammatory biomarkers. She has poor prognosis and is at risk of loss of life. She has palliative care following and is appropriate for hospice/comfort care.      Narrative & Impression  CLINICAL DATA:  Pneumonia, complication suspected, xray done Diffuse/interstitial lung disease   EXAM: CT CHEST WITH CONTRAST   TECHNIQUE: Multidetector CT imaging of the chest was performed during intravenous contrast administration.   RADIATION DOSE REDUCTION: This exam was performed according to the departmental dose-optimization program which includes automated exposure control, adjustment of the mA and/or kV according to patient size and/or use of iterative reconstruction technique.   CONTRAST:  75mL OMNIPAQUE IOHEXOL 300 MG/ML  SOLN   COMPARISON:  X-ray 10/24/2023, CT 11/29/2021   FINDINGS: Cardiovascular: Mild cardiomegaly. Trace pericardial fluid. Lipomatous hypertrophy of the interatrial septum. Thoracic aorta is nonaneurysmal. Atherosclerotic calcifications of the aorta and coronary arteries. Central pulmonary vasculature is mildly dilated.   Mediastinum/Nodes: Multiple enlarged mediastinal and right hilar lymph nodes. Reference nodes include 1.6 cm lower right paratracheal node (series 2, image 49), 1.4 cm AP window node (series 2, image 53), 1.4 cm right hilar node (series 2, image 63). These nodes have all increased in size when compared to the prior CT from  2023. Several small left hilar lymph nodes are present. No axillary lymphadenopathy. 2.3 cm left thyroid lobe nodule. Trachea and esophagus within normal limits. Small hiatal hernia.   Lungs/Pleura: Extensive airspace opacity throughout both lungs involving all lobes, findings are slightly worse within the right lung. Background of interstitial lung disease, not well assessed given the degree of airspace consolidation. No pleural effusion or pneumothorax.   Upper Abdomen: No acute abnormality.   Musculoskeletal: No chest wall abnormality. No acute or significant osseous findings.   IMPRESSION: 1. Extensive airspace opacity throughout both lungs, slightly worse within the right lung. Findings are most suspicious for multifocal pneumonia. 2. Background of interstitial lung disease, not well assessed given the degree of airspace consolidation. Consider follow-up high-resolution chest CT on an outpatient basis following the resolution of patient's acute symptoms. 3. Mediastinal and right hilar lymphadenopathy, increased in size when compared to the prior CT from 2023. Findings are favored to be reactive. 4. Mild cardiomegaly with mild dilation of the central pulmonary vasculature, suggesting pulmonary arterial hypertension. 5. Aortic and coronary artery atherosclerosis (ICD10-I70.0). 6. Incidentally noted 2.3 cm left thyroid lobe nodule. Recommend thyroid US (ref: J Am Coll Radiol. 2015 Feb;12(2): 143-50).     Electronically Signed   By: Duanne Guess D.O.   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 -completed pulsed dose steroids now weaning protocol Elevated lactic acid has resolved  Sepsis RULED OUT RVP 10/26/2023:   negative Reviewed ABG with hypercapnic/hypoxemic resp failure, poor prognosis Legionella and strep pneumococcal antigen - Negative Blood Cultures Negative 10/30/2023 CT and chest  x-ray concerning for multifocal pneumonia Continue supportive O2,  Currently on  high flow [place] currently at FiO2 94%  - wean as tolerated - Continue IV steroids now down to solumedrol 125 bid IV Continue bronchodilators    Acute exacerbation of Interstitial Lung Disease: Bronchodilators - Albuterol Neb Q8H with Chest physiotherapy - MedaNeb when able Methylprednisone IV - pulse dose 1g x 3d Aggressive Incentive spirometry use with cough ABG serial performed and reviewed   Chronic normocytic anemai Goal of Hemoglobin > 8 - currently 7.4  transfusion of 1 unit of PRBC per hospital protocol    HLD (hyperlipidemia) Continue pravastatin   Diabetes mellitus without complication (HCC):  Recent A1c 8.1, at goal for age  Continue insulin therapy   Chronic diastolic CHF (congestive heart failure) (HCC) 2D echo on 01/08/2021 showed EF of 55 to 60% with grade 1 diastolic dysfunction.  Patient has had BMP 372, and chronic lateral leg lymphedema, cannot rule out CHF exacerbation. 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  Counseled on weight loss when medically stable    Code Status:DNR/DNI   Barriers to dispo / significant pending items: respiratory status   Family Communication: Family at bedside - Granddaughter and Son at bedside. Discussed Respiratory status and answered all questions of concern    Physical Exam Constitutional:      General: She is not in acute distress. Cardiovascular:     Rate and Rhythm: Normal rate and regular rhythm.  Pulmonary: Air entry decreased bilaterally.  Wheezing in upper lobes and diminished bases.    Extremities:  Mild bilateral lower extremity edema.  Neurological:     Mental Status: [name]. Mental status is at baseline.  Psychiatric:        Mood and Affect: Mood normal.        Behavior: Behavior normal.     Vitals:   11/04/23 0431 11/04/23 0510 11/04/23 0600 11/04/23 0700  BP:   (!) 145/53 (!) 148/80  Pulse: 89 87 90 96  Resp: (!) 23 (!) 21 18 (!) 29  Temp:      TempSrc:      SpO2: (!) 89% (!) 89% (!) 88% 92%  Weight: 96.3 kg     Height:        Data Reviewed:    Latest Ref Rng & Units 11/04/2023    3:40 AM 11/03/2023    4:39 AM 11/02/2023    2:43 AM  BMP  Glucose 70 - 99 mg/dL 161  096  045   BUN 8 - 23 mg/dL 51  52  50   Creatinine 0.44 - 1.00 mg/dL 4.09  8.11  9.14   Sodium 135 - 145 mmol/L 142  143  143   Potassium 3.5 - 5.1 mmol/L 4.3  4.2  3.4   Chloride 98 - 111 mmol/L 100  100  99   CO2 22 - 32 mmol/L 33  33  31   Calcium 8.9 - 10.3 mg/dL 9.0  8.9  8.7        Latest Ref Rng & Units 11/04/2023    3:40 AM 11/03/2023    4:39 AM 11/02/2023  2:43 AM  CBC  WBC 4.0 - 10.5 K/uL 28.8  26.0  28.4   Hemoglobin 12.0 - 15.0 g/dL 8.5  8.4  8.6   Hematocrit 36.0 - 46.0 % 30.8  29.4  31.2   Platelets 150 - 400 K/uL 278  255  228        Lab Results WBC  Date/Time Value Ref Range Status  11/04/2023 03:40 AM 28.8 (H) 4.0 - 10.5 K/uL Final  11/03/2023 04:39 AM 26.0 (H) 4.0 - 10.5 K/uL Final  11/02/2023 02:43 AM 28.4 (H) 4.0 - 10.5 K/uL Final   Neutrophils Relative %  Date/Time Value Ref Range Status  11/04/2023 03:40 AM 77 % Final  11/03/2023 04:39 AM 78 % Final  11/02/2023 02:43 AM 75 % Final   pCO2 arterial  Date/Time Value Ref Range Status  11/03/2023 06:08 AM 52 (H) 32 - 48 mmHg Final  11/01/2023 03:52 PM 53 (H) 32 - 48 mmHg Final  10/31/2023 10:43 AM 48 32 - 48 mmHg Final   Lactic Acid, Venous  Date/Time Value Ref Range Status  10/25/2023 07:42 PM 1.8 0.5 - 1.9 mmol/L Final    Comment:    Performed at [facility], [address] [place], [place] [address]  10/25/2023 12:18 AM 1.2 0.5 - 1.9 mmol/L Final    Comment:    Performed at [facility], [address] [place], [place] [address]  10/24/2023 06:15 PM 1.3 0.5 - 1.9 mmol/L Final    Comment:    Performed at [facility], [address] [place], [place] [address]   No results found for: "PCO2VEN"   Critical care provider statement:   Total critical care time: 33 minutes   Performed by: Karna Christmas MD   Critical care time was exclusive of separately billable procedures and treating other patients.   Critical care was necessary to treat or prevent imminent or life-threatening deterioration.   Critical care was time spent personally by me on the following activities: development of treatment plan with patient and/or surrogate as well as nursing, discussions with consultants, evaluation of patient's response to treatment, examination of patient, obtaining history from patient or surrogate, ordering and performing treatments and interventions, ordering and review of laboratory studies, ordering and review of radiographic studies, pulse oximetry and re-evaluation of patient's condition.    Vida Rigger, M.D.  Pulmonary & Critical Care Medicine      For on call review [email or web address]

## 2023-11-05 DIAGNOSIS — J189 Pneumonia, unspecified organism: Secondary | ICD-10-CM | POA: Diagnosis not present

## 2023-11-05 DIAGNOSIS — E785 Hyperlipidemia, unspecified: Secondary | ICD-10-CM | POA: Diagnosis not present

## 2023-11-05 DIAGNOSIS — J9621 Acute and chronic respiratory failure with hypoxia: Secondary | ICD-10-CM | POA: Diagnosis not present

## 2023-11-05 DIAGNOSIS — J439 Emphysema, unspecified: Secondary | ICD-10-CM | POA: Diagnosis not present

## 2023-11-05 DIAGNOSIS — Z7189 Other specified counseling: Secondary | ICD-10-CM | POA: Diagnosis not present

## 2023-11-05 LAB — BASIC METABOLIC PANEL
Anion gap: 7 (ref 5–15)
BUN: 47 mg/dL — ABNORMAL HIGH (ref 8–23)
CO2: 33 mmol/L — ABNORMAL HIGH (ref 22–32)
Calcium: 8.7 mg/dL — ABNORMAL LOW (ref 8.9–10.3)
Chloride: 104 mmol/L (ref 98–111)
Creatinine, Ser: 0.78 mg/dL (ref 0.44–1.00)
GFR, Estimated: >60 mL/min (ref >60)
Glucose, Bld: 133 mg/dL — ABNORMAL HIGH (ref 70–99)
Potassium: 4.5 mmol/L (ref 3.5–5.1)
Sodium: 144 mmol/L (ref 135–145)

## 2023-11-05 LAB — DIFFERENTIAL
Abs Immature Granulocytes: 1.42 10*3/uL — ABNORMAL HIGH (ref 0.00–0.07)
Basophils Absolute: 0.1 10*3/uL (ref 0.0–0.1)
Basophils Relative: 0 %
Eosinophils Absolute: 0.5 10*3/uL (ref 0.0–0.5)
Eosinophils Relative: 1 %
Immature Granulocytes: 3 %
Lymphocytes Relative: 1 %
Lymphs Abs: 0.6 10*3/uL — ABNORMAL LOW (ref 0.7–4.0)
Monocytes Absolute: 11.3 10*3/uL — ABNORMAL HIGH (ref 0.1–1.0)
Monocytes Relative: 26 %
Neutro Abs: 28.9 10*3/uL — ABNORMAL HIGH (ref 1.7–7.7)
Neutrophils Relative %: 69 %
Smear Review: NORMAL

## 2023-11-05 LAB — CBC
HCT: 29.8 % — ABNORMAL LOW (ref 36.0–46.0)
Hemoglobin: 8.3 g/dL — ABNORMAL LOW (ref 12.0–15.0)
MCH: 22.6 pg — ABNORMAL LOW (ref 26.0–34.0)
MCHC: 27.9 g/dL — ABNORMAL LOW (ref 30.0–36.0)
MCV: 81.2 fL (ref 80.0–100.0)
Platelets: 297 10*3/uL (ref 150–400)
RBC: 3.67 MIL/uL — ABNORMAL LOW (ref 3.87–5.11)
RDW: 20.3 % — ABNORMAL HIGH (ref 11.5–15.5)
WBC: 42.8 10*3/uL — ABNORMAL HIGH (ref 4.0–10.5)
nRBC: 0.3 % — ABNORMAL HIGH (ref 0.0–0.2)

## 2023-11-05 LAB — GLUCOSE, CAPILLARY
Glucose-Capillary: 103 mg/dL — ABNORMAL HIGH (ref 70–99)
Glucose-Capillary: 109 mg/dL — ABNORMAL HIGH (ref 70–99)
Glucose-Capillary: 197 mg/dL — ABNORMAL HIGH (ref 70–99)
Glucose-Capillary: 161 mg/dL — ABNORMAL HIGH (ref 70–99)

## 2023-11-05 LAB — TECHNOLOGIST SMEAR REVIEW: Plt Morphology: NORMAL

## 2023-11-05 LAB — C-REACTIVE PROTEIN: CRP: 0.6 mg/dL (ref <1.0)

## 2023-11-05 MED ORDER — ONDANSETRON HCL 4 MG/2ML IJ SOLN
4.0000 mg | Freq: Three times a day (TID) | INTRAMUSCULAR | Status: DC | PRN
  Administered 2023-11-07 – 2023-11-08 (×2): 4 mg via INTRAVENOUS
  Filled 2023-11-05 (×2): qty 2

## 2023-11-05 NOTE — Plan of Care (Signed)
  Problem: Fluid Volume: Goal: Ability to maintain a balanced intake and output will improve Outcome: Progressing   Problem: Metabolic: Goal: Ability to maintain appropriate glucose levels will improve Outcome: Progressing   Problem: Nutritional: Goal: Maintenance of adequate nutrition will improve Outcome: Progressing   Problem: Tissue Perfusion: Goal: Adequacy of tissue perfusion will improve Outcome: Progressing   Problem: Clinical Measurements: Goal: Cardiovascular complication will be avoided Outcome: Progressing   Problem: Clinical Measurements: Goal: Ability to maintain a body temperature in the normal range will improve Outcome: Progressing   Problem: Role Relationship: Goal: Family's ability to cope with current situation will improve Outcome: Progressing   Problem: Pain Management: Goal: Satisfaction with pain management regimen will improve Outcome: Progressing

## 2023-11-05 NOTE — Progress Notes (Signed)
Pulmonology Progress Note   Patient: Elizabeth Rowland ZOX:[id number] DOB: [birth year] DOA: 10/24/2023     12 DOS: the patient was seen and examined on 11/05/2023   Brief hospital course:    11/05/23- patient continues to be critically ill.  She does not seem to be responding well to maximal medical management.      Narrative & Impression  CLINICAL DATA:  Pneumonia, complication suspected, xray done Diffuse/interstitial lung disease   EXAM: CT CHEST WITH CONTRAST   TECHNIQUE: Multidetector CT imaging of the chest was performed during intravenous contrast administration.   RADIATION DOSE REDUCTION: This exam was performed according to the departmental dose-optimization program which includes automated exposure control, adjustment of the mA and/or kV according to patient size and/or use of iterative reconstruction technique.   CONTRAST:  75mL OMNIPAQUE IOHEXOL 300 MG/ML  SOLN   COMPARISON:  X-ray 10/24/2023, CT 11/29/2021   FINDINGS: Cardiovascular: Mild cardiomegaly. Trace pericardial fluid. Lipomatous hypertrophy of the interatrial septum. Thoracic aorta is nonaneurysmal. Atherosclerotic calcifications of the aorta and coronary arteries. Central pulmonary vasculature is mildly dilated.   Mediastinum/Nodes: Multiple enlarged mediastinal and right hilar lymph nodes. Reference nodes include 1.6 cm lower right paratracheal node (series 2, image 49), 1.4 cm AP window node (series 2, image 53), 1.4 cm right hilar node (series 2, image 63). These nodes have all increased in size when compared to the prior CT from 2023. Several small left hilar lymph nodes are present. No axillary lymphadenopathy. 2.3 cm left thyroid lobe nodule. Trachea and esophagus within normal limits. Small hiatal hernia.   Lungs/Pleura: Extensive airspace opacity throughout both lungs involving all lobes, findings are slightly worse within the right lung. Background of interstitial lung disease, not well  assessed given the degree of airspace consolidation. No pleural effusion or pneumothorax.   Upper Abdomen: No acute abnormality.   Musculoskeletal: No chest wall abnormality. No acute or significant osseous findings.   IMPRESSION: 1. Extensive airspace opacity throughout both lungs, slightly worse within the right lung. Findings are most suspicious for multifocal pneumonia. 2. Background of interstitial lung disease, not well assessed given the degree of airspace consolidation. Consider follow-up high-resolution chest CT on an outpatient basis following the resolution of patient's acute symptoms. 3. Mediastinal and right hilar lymphadenopathy, increased in size when compared to the prior CT from 2023. Findings are favored to be reactive. 4. Mild cardiomegaly with mild dilation of the central pulmonary vasculature, suggesting pulmonary arterial hypertension. 5. Aortic and coronary artery atherosclerosis (ICD10-I70.0). 6. Incidentally noted 2.3 cm left thyroid lobe nodule. Recommend thyroid US (ref: J Am Coll Radiol. 2015 Feb;12(2): 143-50).     Electronically Signed   By: Duanne Guess D.O.   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 -completed pulsed dose steroids now weaning protocol Elevated lactic acid has resolved  Sepsis RULED OUT RVP 10/26/2023:   negative Reviewed ABG with hypercapnic/hypoxemic resp failure, poor prognosis Legionella and strep pneumococcal antigen - Negative Blood Cultures Negative 10/30/2023 CT and chest x-ray concerning for multifocal pneumonia Continue supportive O2,  Currently on high flow [place] currently at FiO2 94%  - wean as tolerated - Continue IV steroids now down to solumedrol 125 bid IV Continue bronchodilators    Acute exacerbation of Interstitial Lung Disease: Bronchodilators - Albuterol Neb Q8H with Chest physiotherapy - MedaNeb when able Methylprednisone IV  - pulse dose 1g x 3d Aggressive Incentive spirometry use  with cough ABG serial performed and reviewed   Chronic normocytic anemai Goal of Hemoglobin > 8 - currently 7.4  transfusion of 1 unit of PRBC per hospital protocol    HLD (hyperlipidemia) Continue pravastatin   Diabetes mellitus without complication [facility]):  Recent A1c 8.1, at goal for age  Continue insulin therapy   Chronic diastolic CHF (congestive heart failure) (HCC) 2D echo on 01/08/2021 showed EF of 55 to 60% with grade 1 diastolic dysfunction.  Patient has had BMP 372, and chronic lateral leg lymphedema, cannot rule out CHF exacerbation. 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  Counseled on weight loss when medically stable    Code Status:DNR/DNI   Barriers to dispo / significant pending items: respiratory status   Family Communication: Family at bedside - Granddaughter and Son at bedside. Discussed Respiratory status and answered all questions of concern    Physical Exam Constitutional:      General: She is not in acute distress. Cardiovascular:     Rate and Rhythm: Normal rate and regular rhythm.  Pulmonary: Air entry decreased bilaterally.  Wheezing in upper lobes and diminished bases.   Extremities:  Mild bilateral lower extremity edema.  Neurological:     Mental Status: [name]. Mental status is at baseline.  Psychiatric:        Mood and Affect: Mood normal.        Behavior: Behavior normal.     Vitals:   11/05/23 0725 11/05/23 0800 11/05/23 0900 11/05/23 1000  BP:  (!) 119/57 (!) 135/95 129/64  Pulse: 99 94 98 92  Resp: (!) 26 (!) 27 (!) 30  20  Temp:  98.4 F (36.9 C)    TempSrc:  Axillary    SpO2: (!) 86% (!) 89% (!) 89% 95%  Weight:      Height:        Data Reviewed:    Latest Ref Rng & Units 11/05/2023    2:03 AM 11/04/2023    3:40 AM 11/03/2023    4:39 AM  BMP  Glucose 70 - 99 mg/dL 086  578  469   BUN 8 - 23 mg/dL 47  51  52   Creatinine 0.44 - 1.00 mg/dL 6.29  5.28  4.13   Sodium 135 - 145 mmol/L 144  142  143   Potassium 3.5 - 5.1 mmol/L 4.5  4.3  4.2   Chloride 98 - 111 mmol/L 104  100  100   CO2 22 - 32 mmol/L 33  33  33   Calcium 8.9 - 10.3 mg/dL 8.7  9.0  8.9        Latest Ref Rng & Units 11/05/2023    2:03 AM 11/04/2023    3:40 AM 11/03/2023    4:39 AM  CBC  WBC 4.0 - 10.5 K/uL 42.8  28.8  26.0   Hemoglobin 12.0 - 15.0 g/dL 8.3  8.5  8.4   Hematocrit 36.0 - 46.0 % 29.8  30.8  29.4   Platelets 150 - 400 K/uL 297  278  255  Lab Results WBC  Date/Time Value Ref Range Status  11/05/2023 02:03 AM 42.8 (H) 4.0 - 10.5 K/uL Final  11/04/2023 03:40 AM 28.8 (H) 4.0 - 10.5 K/uL Final  11/03/2023 04:39 AM 26.0 (H) 4.0 - 10.5 K/uL Final   Neutrophils Relative %  Date/Time Value Ref Range Status  11/05/2023 02:03 AM 69 % Final  11/04/2023 03:40 AM 77 % Final  11/03/2023 04:39 AM 78 % Final   pCO2 arterial  Date/Time Value Ref Range Status  11/03/2023 06:08 AM 52 (H) 32 - 48 mmHg Final  11/01/2023 03:52 PM 53 (H) 32 - 48 mmHg Final  10/31/2023 10:43 AM 48 32 - 48 mmHg Final   Lactic Acid, Venous  Date/Time Value Ref Range Status  10/25/2023 07:42 PM 1.8 0.5 - 1.9 mmol/L Final    Comment:    Performed at [facility], [address] [place], [place] [address]  10/25/2023 12:18 AM 1.2 0.5 - 1.9 mmol/L Final    Comment:    Performed at [facility], [address] [place], [place] [address]  10/24/2023 06:15 PM 1.3 0.5 - 1.9 mmol/L Final    Comment:    Performed at [facility], [address] [place], [place] [address]   pCO2, Ven  Date/Time Value Ref  Range Status  11/04/2023 06:49 PM 51 44 - 60 mmHg Final     Critical care provider statement:   Total critical care time: 33 minutes   Performed by: Karna Christmas MD   Critical care time was exclusive of separately billable procedures and treating other patients.   Critical care was necessary to treat or prevent imminent or life-threatening deterioration.   Critical care was time spent personally by me on the following activities: development of treatment plan with patient and/or surrogate as well as nursing, discussions with consultants, evaluation of patient's response to treatment, examination of patient, obtaining history from patient or surrogate, ordering and performing treatments and interventions, ordering and review of laboratory studies, ordering and review of radiographic studies, pulse oximetry and re-evaluation of patient's condition.    Vida Rigger, M.D.  Pulmonary & Critical Care Medicine      For on call review [email or web address]

## 2023-11-05 NOTE — Progress Notes (Addendum)
Daily Progress Note   Patient Name: Elizabeth Rowland       Date: 11/05/2023 DOB: 1941-06-09  Age: 83 y.o. MRN#: [id number] Attending Physician: Arnetha Courser, MD Primary Care Physician: Shane Crutch, [place] Admit Date: 10/24/2023  Reason for Consultation/Follow-up: Establishing goals of care  Subjective: Notes and labs reviewed. In to see patient. No family at bedside. Patient states "help me, help me". With talking to her, she states she wants to continue current care to try to live longer. She states "I'm not ready to die yet". She initially asks that I do not call her son. With discussion she is amenable to calling him to update.   Called to talk to son Elizabeth Rowland. He has been kept well updated and is able to articulate her condition and treatment. He is aware of her poor prognosis. Elizabeth Rowland states at baseline, her breathing is good, but she has panic attacks where she feels she cannot breathe, and requires medication for anxiety. He states he spoke with her yesterday, and she stated she wanted to continue to treat the treatable.   Length of Stay: 12  Current Medications: Scheduled Meds:   albuterol  2.5 mg Inhalation Q8H   ammonium lactate   Topical Daily   apixaban  5 mg Oral BID   Chlorhexidine Gluconate Cloth  6 each Topical Daily   Chlorhexidine Gluconate Cloth  6 each Topical Q2200   feeding supplement  237 mL Oral BID BM   gabapentin  300 mg Oral TID   insulin aspart  0-5 Units Subcutaneous QHS   insulin aspart  0-9 Units Subcutaneous TID WC   insulin glargine-yfgn  10 Units Subcutaneous QHS   magnesium hydroxide  30 mL Oral Once   methylPREDNISolone (SOLU-MEDROL) injection  80 mg Intravenous Daily   mouth rinse  15 mL Mouth Rinse 4 times per day   polyethylene glycol  17 g Oral  Daily   pravastatin  40 mg Oral q1800   ziprasidone  20 mg Oral BID WC    Continuous Infusions:   PRN Meds: acetaminophen, acetaminophen, albuterol, dextromethorphan-guaiFENesin, hydrALAZINE, hydrOXYzine, morphine injection, mouth rinse, oxyCODONE-acetaminophen, sodium chloride  Physical Exam Pulmonary:     Comments: Some WOB noted, very anxious Neurological:     Mental Status: [name].  Vital Signs: BP 129/64   Pulse 92   Temp 98.6 F (37 C) (Axillary)   Resp 20   Ht 5\' 4"  (1.626 m)   Wt 96.1 kg   SpO2 (!) 83%   BMI 36.37 kg/m  SpO2: SpO2: (!) 83 % O2 Device: O2 Device: Heated High Flow Nasal Cannula O2 Flow Rate: O2 Flow Rate (L/min): 60 L/min  Intake/output summary:  Intake/Output Summary (Last 24 hours) at 11/05/2023 1317 Last data filed at 11/05/2023 1100 Gross per 24 hour  Intake 990 ml  Output 1170 ml  Net -180 ml   LBM: Last BM Date : 11/04/23 Baseline Weight: Weight: 90.7 kg Most recent weight: Weight: 96.1 kg    Patient Active Problem List   Diagnosis Date Noted   Acute on chronic respiratory failure with hypoxia (HCC) 10/25/2023   Multifocal pneumonia 10/24/2023   Obesity (BMI 30-39.9) 10/24/2023   Myocardial injury 10/24/2023   Prolonged QT interval 10/24/2023   HLD (hyperlipidemia)    Diabetes mellitus without complication (HCC)    COPD (chronic obstructive pulmonary disease) (HCC)    Chronic diastolic CHF (congestive heart failure) (HCC)    Pulmonary embolism (HCC)    DVT (deep venous thrombosis) (HCC)    Anxiety     Palliative Care Assessment & Plan    Recommendations/Plan: Continue current care.    Code Status:    Code Status Orders  (From admission, onward)           Start     Ordered   10/30/23 1114  Do not attempt resuscitation (DNR)- Limited -Do Not Intubate (DNI)  (Code Status)  Continuous       Question Answer Comment  If pulseless and not breathing No CPR or chest compressions.   In Pre-Arrest  Conditions (Patient Is Breathing and Has A Pulse) Do not intubate. Provide all appropriate non-invasive medical interventions. Avoid ICU transfer unless indicated or required.   Consent: Discussion documented in EHR or advanced directives reviewed      10/30/23 1113           Code Status History     Date Active Date Inactive Code Status Order ID Comments User Context   10/24/2023 2004 10/30/2023 1113 Full Code [id number]  Lorretta Harp, MD ED       Prognosis: Poor   Thank you for allowing the Palliative Medicine Team to assist in the care of this patient.    Morton Stall, NP  Please contact Palliative Medicine Team phone at [phone] for questions and concerns.

## 2023-11-05 NOTE — Progress Notes (Signed)
Progress Note   Patient: Elizabeth Rowland [id number] DOB: Nov 26, 1940 DOA: 10/24/2023     12 DOS: the patient was seen and examined on 11/05/2023  Brief hospital course:  83 y.o. female with medical history significant of PE and DVT on Eliquis, HTN, HDL, DM, COPD, dCHf, anxiety, obesity, lymphedema, who presents with shortness of breath.  Per report, patient was found to have severe respiratory distress, with oxygen desaturation to 50% on room air, initially started on CPAP with 85% of saturation, then BiPAP started in ED with improvement.    02/07: admitted to hospitalist service, needing BiPap support into early 02/09 02/09-02/10: remains on [facility] [place] O2 support  02/11: really not weaning down on high flow, will get CT chest, based on exam suspect may have pleural effusion, eval for PE would be prudent though she has been compliant w/ eliquis she has hx  2/19: Hemodynamically stable, able to wean FiO2 to 85%, remained on 60 L of heated high flow, clinically seems improving and does not want to give up stating that she has been through this multiple times.  Significant leukocytosis at 42.8-patient received very high doses of Solu-Medrol for 3 days, differential and smear review with mild left shift, 1 to 5% matters, polychromasia and target cells, CRP improved.  If leukocytosis continue to get worse we will involve hematology.  Continuing current level of care.     ASSESSMENT & PLAN:   Acute on chronic respiratory failure with hypoxia due to interstitial lung disease flare as well as possible multifocal pneumonia Elevated lactic acid more likely due to hypoxia.  Sepsis RULED OUT RVP nothing viral detected Continue supportive O2, wean as tolerated Has completed a course of antibiotics including doxycycline and cefepime Continue bronchodilators as needed, Continue high flow nasal oxygenation Legionella and strep antigen negative Chest CT scan showing extensive infiltrate Continue steroid  therapy as recommended by pulmonologist Case discussed with pulmonologist at bedside today Palliative care on board and at this point patient is not ready to transition to comfort care Continue high flow oxygen, pulmonologist have agreed with a target saturation above 80      COPD (chronic obstructive pulmonary disease) (HCC): Continue bronchodilators as as above   HLD (hyperlipidemia) Continue pravastatin   Diabetes mellitus without complication (HCC):  Recent A1c 8.1, at goal for age  Continue insulin therapy   Chronic diastolic CHF (congestive heart failure) (HCC) 2D echo on 01/08/2021 showed EF of 55 to 60% with grade 1 diastolic dysfunction.  Patient has had BMP 372, and chronic lateral leg lymphedema, cannot rule out CHF exacerbation. 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  Counseled on weight loss when medically stable   DVT prophylaxis: Eliquis   Code Status: DNI DNR   Barriers to dispo / significant pending items: respiratory status   Subjective  Patient was seen and examined with pulmonology today.  She wants to continue current level of care and thinking that he is  slowly improving.  No new concern.   Family Communication: Patient does not want to communicate with son due to his excessive anxiety     Physical Exam General.  Frail elderly lady, in no acute distress. Pulmonary.  Little harsh breath sounds bilaterally, normal respiratory effort. CV.  Regular rate and rhythm, no JVD, rub or murmur. Abdomen.   Soft, nontender, nondistended, BS positive. CNS.  Alert and oriented .  No focal neurologic deficit. Extremities.  No edema,  pulses intact and symmetrical.  Signs of chronic venous congestion. Psychiatry.  Judgment and insight appears normal.     Data reviewed:    Latest Ref Rng & Units 11/05/2023    2:03 AM 11/04/2023    3:40 AM 11/03/2023    4:39 AM  CBC  WBC 4.0 - 10.5 K/uL 42.8  28.8  26.0   Hemoglobin 12.0 - 15.0 g/dL 8.3  8.5  8.4   Hematocrit 36.0 - 46.0 % 29.8  30.8  29.4   Platelets 150 - 400 K/uL 297  278  255        Latest Ref Rng & Units 11/05/2023    2:03 AM 11/04/2023    3:40 AM 11/03/2023    4:39 AM  BMP  Glucose 70 - 99 mg/dL 295  621  308   BUN 8 - 23 mg/dL 47  51  52   Creatinine 0.44 - 1.00 mg/dL 6.57  8.46  9.62   Sodium 135 - 145 mmol/L 144  142  143   Potassium 3.5 - 5.1 mmol/L 4.5  4.3  4.2   Chloride 98 - 111 mmol/L 104  100  100   CO2 22 - 32 mmol/L 33  33  33   Calcium 8.9 - 10.3 mg/dL 8.7  9.0  8.9      Vitals:   11/05/23 1300 11/05/23 1309 11/05/23 1400 11/05/23 1500  BP:   (!) 144/65 (!) 148/65  Pulse:    (!) 101  Resp:   (!) 21 17  Temp:      TempSrc:      SpO2: (!) 83% (!) 83% (!) 89% (!) 82%  Weight:      Height:       Disposition: Palliative care still engaging family and patient in discussion of goals of care with possible hopes of transitioning to comfort care measures Patient does not want to de-escalate care at this time  Author: Arnetha Courser, MD 11/05/2023 3:15 PM  For on call review [email or web address].

## 2023-11-05 NOTE — Plan of Care (Signed)
  Problem: Education: Goal: Ability to describe self-care measures that may prevent or decrease complications (Diabetes Survival Skills Education) will improve Outcome: Progressing   Problem: Fluid Volume: Goal: Ability to maintain a balanced intake and output will improve Outcome: Progressing   Problem: Health Behavior/Discharge Planning: Goal: Ability to identify and utilize available resources and services will improve Outcome: Progressing Goal: Ability to manage health-related needs will improve Outcome: Progressing   Problem: Metabolic: Goal: Ability to maintain appropriate glucose levels will improve Outcome: Progressing   Problem: Skin Integrity: Goal: Risk for impaired skin integrity will decrease Outcome: Progressing   Problem: Tissue Perfusion: Goal: Adequacy of tissue perfusion will improve Outcome: Progressing   Problem: Clinical Measurements: Goal: Diagnostic test results will improve Outcome: Progressing   Problem: Coping: Goal: Level of anxiety will decrease Outcome: Progressing   Problem: Elimination: Goal: Will not experience complications related to urinary retention Outcome: Progressing   Problem: Safety: Goal: Ability to remain free from injury will improve Outcome: Progressing

## 2023-11-06 DIAGNOSIS — Z7189 Other specified counseling: Secondary | ICD-10-CM | POA: Diagnosis not present

## 2023-11-06 DIAGNOSIS — J189 Pneumonia, unspecified organism: Secondary | ICD-10-CM | POA: Diagnosis not present

## 2023-11-06 DIAGNOSIS — E785 Hyperlipidemia, unspecified: Secondary | ICD-10-CM | POA: Diagnosis not present

## 2023-11-06 DIAGNOSIS — J9621 Acute and chronic respiratory failure with hypoxia: Secondary | ICD-10-CM | POA: Diagnosis not present

## 2023-11-06 DIAGNOSIS — J439 Emphysema, unspecified: Secondary | ICD-10-CM | POA: Diagnosis not present

## 2023-11-06 LAB — CBC
HCT: 32 % — ABNORMAL LOW (ref 36.0–46.0)
Hemoglobin: 8.9 g/dL — ABNORMAL LOW (ref 12.0–15.0)
MCH: 22.5 pg — ABNORMAL LOW (ref 26.0–34.0)
MCHC: 27.8 g/dL — ABNORMAL LOW (ref 30.0–36.0)
MCV: 81 fL (ref 80.0–100.0)
Platelets: 268 10*3/uL (ref 150–400)
RBC: 3.95 MIL/uL (ref 3.87–5.11)
RDW: 20.2 % — ABNORMAL HIGH (ref 11.5–15.5)
WBC: 39.3 10*3/uL — ABNORMAL HIGH (ref 4.0–10.5)
nRBC: 0.1 % (ref 0.0–0.2)

## 2023-11-06 LAB — BASIC METABOLIC PANEL
Anion gap: 9 (ref 5–15)
BUN: 37 mg/dL — ABNORMAL HIGH (ref 8–23)
CO2: 31 mmol/L (ref 22–32)
Calcium: 8.5 mg/dL — ABNORMAL LOW (ref 8.9–10.3)
Chloride: 102 mmol/L (ref 98–111)
Creatinine, Ser: 0.75 mg/dL (ref 0.44–1.00)
GFR, Estimated: >60 mL/min (ref >60)
Glucose, Bld: 178 mg/dL — ABNORMAL HIGH (ref 70–99)
Potassium: 4.7 mmol/L (ref 3.5–5.1)
Sodium: 142 mmol/L (ref 135–145)

## 2023-11-06 LAB — GLUCOSE, CAPILLARY
Glucose-Capillary: 118 mg/dL — ABNORMAL HIGH (ref 70–99)
Glucose-Capillary: 117 mg/dL — ABNORMAL HIGH (ref 70–99)
Glucose-Capillary: 308 mg/dL — ABNORMAL HIGH (ref 70–99)
Glucose-Capillary: 159 mg/dL — ABNORMAL HIGH (ref 70–99)

## 2023-11-06 LAB — C-REACTIVE PROTEIN: CRP: 4.3 mg/dL — ABNORMAL HIGH (ref <1.0)

## 2023-11-06 MED ORDER — CALCIUM CARBONATE ANTACID 500 MG PO CHEW
1.0000 | CHEWABLE_TABLET | ORAL | Status: DC | PRN
  Administered 2023-11-07: 200 mg via ORAL
  Filled 2023-11-06: qty 1

## 2023-11-06 MED ORDER — ALBUTEROL SULFATE (2.5 MG/3ML) 0.083% IN NEBU
2.5000 mg | INHALATION_SOLUTION | Freq: Three times a day (TID) | RESPIRATORY_TRACT | Status: DC
  Administered 2023-11-07 – 2023-11-08 (×3): 2.5 mg via RESPIRATORY_TRACT
  Filled 2023-11-06 (×5): qty 3

## 2023-11-06 MED ORDER — METHYLPREDNISOLONE SODIUM SUCC 125 MG IJ SOLR
125.0000 mg | Freq: Two times a day (BID) | INTRAMUSCULAR | Status: DC
  Administered 2023-11-06 – 2023-11-07 (×3): 125 mg via INTRAVENOUS
  Filled 2023-11-06 (×3): qty 2

## 2023-11-06 MED ORDER — LORAZEPAM 2 MG/ML IJ SOLN
1.0000 mg | Freq: Once | INTRAMUSCULAR | Status: AC
  Administered 2023-11-06: 1 mg via INTRAVENOUS
  Filled 2023-11-06: qty 1

## 2023-11-06 MED ORDER — MORPHINE SULFATE (PF) 2 MG/ML IV SOLN
2.0000 mg | INTRAVENOUS | Status: DC | PRN
  Administered 2023-11-07 – 2023-11-08 (×5): 2 mg via INTRAVENOUS
  Filled 2023-11-06 (×5): qty 1

## 2023-11-06 NOTE — Progress Notes (Signed)
   11/06/23 1500  Spiritual Encounters  Type of Visit Follow up  Care provided to: Patient  Referral source Chaplain assessment  Reason for visit Routine spiritual support  OnCall Visit No  Spiritual Framework  Presenting Themes Other (comment) (Sitting w/Pt for a few minutes to help w/feelings of isolation)  Interventions  Spiritual Care Interventions Made Compassionate presence  Intervention Outcomes  Outcomes Awareness of support;Reduced fear

## 2023-11-06 NOTE — Progress Notes (Signed)
   11/06/23 1410  Spiritual Encounters  Type of Visit Initial  Care provided to: Patient  Referral source Chaplain assessment  Reason for visit Routine spiritual support  OnCall Visit No  Spiritual Framework  Presenting Themes Other (comment) (Just needed a drink of water)  Interventions  Spiritual Care Interventions Made Established relationship of care and support;Compassionate presence;Reflective listening  Intervention Outcomes  Outcomes Connection to spiritual care;Reduced isolation

## 2023-11-06 NOTE — Progress Notes (Signed)
Pt.  Lethargic  so hard to arouse  On Bipap vital sign stable , provider Larkin Ina NP provider notify about it and will come by to check on pt.2300 Pt. Starting to wake up incontinent of stool Hygiene provided but starting to get restless and unable to tolerate turning. Hygiene and bath completed. Provider came @ bedside and saw pt. Made aware that atarax was just given for anxiety and order to go a head and give dose of morphine, closely monitored.

## 2023-11-06 NOTE — Plan of Care (Signed)
  Problem: Metabolic: Goal: Ability to maintain appropriate glucose levels will improve Outcome: Progressing   Problem: Elimination: Goal: Will not experience complications related to bowel motility Outcome: Progressing   Problem: Clinical Measurements: Goal: Ability to maintain a body temperature in the normal range will improve Outcome: Progressing

## 2023-11-06 NOTE — Progress Notes (Signed)
Daily Progress Note   Patient Name: Elizabeth Rowland       Date: 11/06/2023 DOB: Mar 27, 1941  Age: 83 y.o. MRN#: [id number] Attending Physician: Arnetha Courser, MD Primary Care Physician: Shane Crutch, [place] Admit Date: 10/24/2023  Reason for Consultation/Follow-up: Establishing goals of care  Subjective: Notes and labs reviewed. In to see patient. No family at bedside. She is resting in bed, with nursing at bedside feeding her ice cream. She has high flow cannula in place. She denies complaint at this time.    She states she is on 5-6 lpm of O2 at baseline. She denies using an assistive device. She states she is able to help cook and clean the home. She is hopeful for improvement.   Length of Stay: 13  Current Medications: Scheduled Meds:   albuterol  2.5 mg Inhalation Q8H   ammonium lactate   Topical Daily   apixaban  5 mg Oral BID   Chlorhexidine Gluconate Cloth  6 each Topical Daily   Chlorhexidine Gluconate Cloth  6 each Topical Q2200   feeding supplement  237 mL Oral BID BM   gabapentin  300 mg Oral TID   insulin aspart  0-5 Units Subcutaneous QHS   insulin aspart  0-9 Units Subcutaneous TID WC   insulin glargine-yfgn  10 Units Subcutaneous QHS   magnesium hydroxide  30 mL Oral Once   methylPREDNISolone (SOLU-MEDROL) injection  80 mg Intravenous Daily   polyethylene glycol  17 g Oral Daily   pravastatin  40 mg Oral q1800   ziprasidone  20 mg Oral BID WC    Continuous Infusions:   PRN Meds: acetaminophen, acetaminophen, albuterol, dextromethorphan-guaiFENesin, hydrALAZINE, hydrOXYzine, morphine injection, ondansetron (ZOFRAN) IV, mouth rinse, oxyCODONE-acetaminophen, sodium chloride  Physical Exam Pulmonary:     Comments: On high flow cannula.  Neurological:      Mental Status: [name].             Vital Signs: BP 137/60   Pulse (!) 106   Temp 98.6 F (37 C) (Axillary)   Resp (!) 25   Ht 5\' 4"  (1.626 m)   Wt 95.6 kg   SpO2 (!) 84%   BMI 36.18 kg/m  SpO2: SpO2: (!) 84 % O2 Device: O2 Device: Heated High Flow Nasal Cannula O2 Flow Rate: O2 Flow Rate (L/min):  60 L/min  Intake/output summary:  Intake/Output Summary (Last 24 hours) at 11/06/2023 1540 Last data filed at 11/06/2023 1509 Gross per 24 hour  Intake 820 ml  Output 965 ml  Net -145 ml   LBM: Last BM Date : 11/05/23 Baseline Weight: Weight: 90.7 kg Most recent weight: Weight: 95.6 kg    Patient Active Problem List   Diagnosis Date Noted   Acute on chronic respiratory failure with hypoxia (HCC) 10/25/2023   Multifocal pneumonia 10/24/2023   Obesity (BMI 30-39.9) 10/24/2023   Myocardial injury 10/24/2023   Prolonged QT interval 10/24/2023   HLD (hyperlipidemia)    Diabetes mellitus without complication (HCC)    COPD (chronic obstructive pulmonary disease) (HCC)    Chronic diastolic CHF (congestive heart failure) (HCC)    Pulmonary embolism (HCC)    DVT (deep venous thrombosis) (HCC)    Anxiety     Palliative Care Assessment & Plan    Recommendations/Plan: DNR/DNI. Continue to treat the treatable. PMT will follow up Sunday.   Code Status:    Code Status Orders  (From admission, onward)           Start     Ordered   10/30/23 1114  Do not attempt resuscitation (DNR)- Limited -Do Not Intubate (DNI)  (Code Status)  Continuous       Question Answer Comment  If pulseless and not breathing No CPR or chest compressions.   In Pre-Arrest Conditions (Patient Is Breathing and Has A Pulse) Do not intubate. Provide all appropriate non-invasive medical interventions. Avoid ICU transfer unless indicated or required.   Consent: Discussion documented in EHR or advanced directives reviewed      02 /13/25 1113           Code Status History     Date Active Date  Inactive Code Status Order ID Comments User Context   10/24/2023 2004 10/30/2023 1113 Full Code [id number]  Lorretta Harp, MD ED       Prognosis:  poor    Care plan was discussed with RN  Thank you for allowing the Palliative Medicine Team to assist in the care of this patient.  Morton Stall, NP  Please contact Palliative Medicine Team phone at [phone] for questions and concerns.

## 2023-11-06 NOTE — Plan of Care (Signed)
  Problem: Coping: Goal: Ability to adjust to condition or change in health will improve Outcome: Progressing   Problem: Fluid Volume: Goal: Ability to maintain a balanced intake and output will improve Outcome: Progressing   Problem: Health Behavior/Discharge Planning: Goal: Ability to identify and utilize available resources and services will improve Outcome: Progressing Goal: Ability to manage health-related needs will improve Outcome: Progressing   Problem: Metabolic: Goal: Ability to maintain appropriate glucose levels will improve Outcome: Progressing   Problem: Nutritional: Goal: Maintenance of adequate nutrition will improve Outcome: Progressing Goal: Progress toward achieving an optimal weight will improve Outcome: Progressing   Problem: Skin Integrity: Goal: Risk for impaired skin integrity will decrease Outcome: Progressing   Problem: Tissue Perfusion: Goal: Adequacy of tissue perfusion will improve Outcome: Progressing   Problem: Education: Goal: Knowledge of General Education information will improve Description: Including pain rating scale, medication(s)/side effects and non-pharmacologic comfort measures Outcome: Progressing   Problem: Health Behavior/Discharge Planning: Goal: Ability to manage health-related needs will improve Outcome: Progressing   Problem: Clinical Measurements: Goal: Will remain free from infection Outcome: Progressing Goal: Diagnostic test results will improve Outcome: Progressing Goal: Cardiovascular complication will be avoided Outcome: Progressing   Problem: Activity: Goal: Risk for activity intolerance will decrease Outcome: Progressing   Problem: Coping: Goal: Level of anxiety will decrease Outcome: Progressing   Problem: Pain Managment: Goal: General experience of comfort will improve and/or be controlled Outcome: Progressing   Problem: Safety: Goal: Ability to remain free from injury will improve Outcome:  Progressing   Problem: Skin Integrity: Goal: Risk for impaired skin integrity will decrease Outcome: Progressing

## 2023-11-06 NOTE — Progress Notes (Signed)
Pulmonology Progress Note   Patient: Elizabeth Rowland YNW:[id number] DOB: April 27, 1941 DOA: 10/24/2023     13 DOS: the patient was seen and examined on 11/06/2023   Brief hospital course:    11/06/23 - patient today had increased O2 req.  She was weaned to 85% yesterday but now again is with increased O2 req to 100%.  She has poor prognosis and it is unlikely that she will survive this illness. CRP is elevated and she did have visitors so we will recheck viral panel as well as increase dosing of steroids.      Narrative & Impression  CLINICAL DATA:  Pneumonia, complication suspected, xray done Diffuse/interstitial lung disease   EXAM: CT CHEST WITH CONTRAST   TECHNIQUE: Multidetector CT imaging of the chest was performed during intravenous contrast administration.   RADIATION DOSE REDUCTION: This exam was performed according to the departmental dose-optimization program which includes automated exposure control, adjustment of the mA and/or kV according to patient size and/or use of iterative reconstruction technique.   CONTRAST:  75mL OMNIPAQUE IOHEXOL 300 MG/ML  SOLN   COMPARISON:  X-ray 10/24/2023, CT 11/29/2021   FINDINGS: Cardiovascular: Mild cardiomegaly. Trace pericardial fluid. Lipomatous hypertrophy of the interatrial septum. Thoracic aorta is nonaneurysmal. Atherosclerotic calcifications of the aorta and coronary arteries. Central pulmonary vasculature is mildly dilated.   Mediastinum/Nodes: Multiple enlarged mediastinal and right hilar lymph nodes. Reference nodes include 1.6 cm lower right paratracheal node (series 2, image 49), 1.4 cm AP window node (series 2, image 53), 1.4 cm right hilar node (series 2, image 63). These nodes have all increased in size when compared to the prior CT from 2023. Several small left hilar lymph nodes are present. No axillary lymphadenopathy. 2.3 cm left thyroid lobe nodule. Trachea and esophagus within normal limits. Small hiatal  hernia.   Lungs/Pleura: Extensive airspace opacity throughout both lungs involving all lobes, findings are slightly worse within the right lung. Background of interstitial lung disease, not well assessed given the degree of airspace consolidation. No pleural effusion or pneumothorax.   Upper Abdomen: No acute abnormality.   Musculoskeletal: No chest wall abnormality. No acute or significant osseous findings.   IMPRESSION: 1. Extensive airspace opacity throughout both lungs, slightly worse within the right lung. Findings are most suspicious for multifocal pneumonia. 2. Background of interstitial lung disease, not well assessed given the degree of airspace consolidation. Consider follow-up high-resolution chest CT on an outpatient basis following the resolution of patient's acute symptoms. 3. Mediastinal and right hilar lymphadenopathy, increased in size when compared to the prior CT from 2023. Findings are favored to be reactive. 4. Mild cardiomegaly with mild dilation of the central pulmonary vasculature, suggesting pulmonary arterial hypertension. 5. Aortic and coronary artery atherosclerosis (ICD10-I70.0). 6. Incidentally noted 2.3 cm left thyroid lobe nodule. Recommend thyroid US (ref: J Am Coll Radiol. 2015 Feb;12(2): 143-50).     Electronically Signed   By: Duanne Guess D.O.   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 -completed pulsed dose steroids now weaning protocol Elevated lactic acid has resolved  Sepsis RULED OUT RVP 10/26/2023:   negative Reviewed ABG with hypercapnic/hypoxemic resp failure, poor prognosis Legionella and strep pneumococcal antigen - Negative Blood Cultures Negative 10/30/2023 CT and chest x-ray concerning for multifocal pneumonia Continue supportive O2,  Currently on high flow [place] currently at FiO2 94%  - wean as tolerated - Continue IV steroids now down  to  solumedrol 125 bid IV Continue bronchodilators    Acute exacerbation of Interstitial Lung Disease: Bronchodilators - Albuterol Neb Q8H with Chest physiotherapy - MedaNeb when able Methylprednisone IV - pulse dose 1g x 3d Aggressive Incentive spirometry use with cough ABG serial performed and reviewed   Chronic normocytic anemai Goal of Hemoglobin > 8 - currently 7.4  transfusion of 1 unit of PRBC per hospital protocol    HLD (hyperlipidemia) Continue pravastatin   Diabetes mellitus without complication (HCC):  Recent A1c 8.1, at goal for age  Continue insulin therapy   Chronic diastolic CHF (congestive heart failure) (HCC) 2D echo on 01/08/2021 showed EF of 55 to 60% with grade 1 diastolic dysfunction.  Patient has had BMP 372, and chronic lateral leg lymphedema, cannot rule out CHF exacerbation. 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  Counseled on weight loss when medically stable    Code Status:DNR/DNI   Barriers to dispo / significant pending items: respiratory status   Family Communication: Family at bedside - Granddaughter and Son at bedside. Discussed Respiratory status and answered all questions of concern    Physical Exam Constitutional:      General: She is not in acute distress. Cardiovascular:     Rate and Rhythm: Normal rate and regular rhythm.  Pulmonary: Air entry decreased bilaterally.  Wheezing in upper lobes and diminished bases.   Extremities:  Mild bilateral lower extremity edema.  Neurological:     Mental Status: [name]. Mental status is at baseline.  Psychiatric:        Mood and Affect:  Mood normal.        Behavior: Behavior normal.     Vitals:   11/06/23 1600 11/06/23 1615 11/06/23 1635 11/06/23 1700  BP: (!) 152/73   (!) 146/58  Pulse: (!) 111 (!) 109 (!) 109 (!) 109  Resp: (!) 32 (!) 26 (!) 29 (!) 31  Temp:      TempSrc:      SpO2: (!) 85% (!) 81% 92% 92%  Weight:      Height:        Data Reviewed:    Latest Ref Rng & Units 11/06/2023    1:18 AM 11/05/2023    2:03 AM 11/04/2023    3:40 AM  BMP  Glucose 70 - 99 mg/dL 147  829  562   BUN 8 - 23 mg/dL 37  47  51   Creatinine 0.44 - 1.00 mg/dL 1.30  8.65  7.84   Sodium 135 - 145 mmol/L 142  144  142   Potassium 3.5 - 5.1 mmol/L 4.7  4.5  4.3   Chloride 98 - 111 mmol/L 102  104  100   CO2 22 - 32 mmol/L 31  33  33   Calcium 8.9 - 10.3 mg/dL 8.5  8.7  9.0        Latest Ref Rng & Units 11/06/2023    1:18 AM 11/05/2023    2:03 AM 11/04/2023    3:40 AM  CBC  WBC 4.0 - 10.5 K/uL 39.3  42.8  28.8   Hemoglobin 12.0 - 15.0 g/dL 8.9  8.3  8.5   Hematocrit 36.0 - 46.0 % 32.0  29.8  30.8   Platelets 150 - 400 K/uL 268  297  278        Lab Results WBC  Date/Time Value Ref Range Status  11/06/2023 01:18 AM 39.3 (H) 4.0 - 10.5 K/uL Final  11/05/2023 02:03 AM 42.8 (H) 4.0 - 10.5 K/uL Final  11/04/2023 03:40 AM 28.8 (H) 4.0 - 10.5 K/uL Final   Neutrophils Relative %  Date/Time Value Ref Range Status  11/05/2023 02:03 AM 69 % Final  11/04/2023 03:40 AM 77 % Final  11/03/2023 04:39 AM 78 % Final   pCO2 arterial  Date/Time Value Ref Range Status  11/03/2023 06:08 AM 52 (H) 32 - 48 mmHg Final  11/01/2023 03:52 PM 53 (H) 32 - 48 mmHg Final  10/31/2023 10:43 AM 48 32 - 48 mmHg Final   Lactic Acid, Venous  Date/Time Value Ref Range Status  10/25/2023 07:42 PM 1.8 0.5 - 1.9 mmol/L Final    Comment:    Performed at [facility], [address] [place], [place] [address]  10/25/2023 12:18 AM 1.2 0.5 - 1.9 mmol/L Final    Comment:    Performed at [facility], [address]  [place], [place] [address]  10/24/2023 06:15 PM 1.3 0.5 - 1.9 mmol/L Final    Comment:    Performed at [facility], [address] [place], [place] [address]   pCO2, Ven  Date/Time Value Ref Range Status  11/04/2023 06:49 PM 51 44 - 60 mmHg Final     Critical care provider statement:   Total critical care time: 33 minutes   Performed by: Karna Christmas MD   Critical care time was exclusive of separately billable procedures and treating other patients.   Critical care was necessary to treat or prevent imminent or life-threatening deterioration.   Critical care was time spent personally by me on the following activities: development of treatment plan with patient and/or surrogate as well as nursing, discussions with consultants, evaluation of patient's response to treatment, examination of patient, obtaining history from patient or surrogate, ordering and performing treatments and interventions, ordering and review of laboratory studies, ordering and review of radiographic studies, pulse oximetry and re-evaluation of patient's condition.    Vida Rigger, M.D.  Pulmonary & Critical Care Medicine      For on call review [email or web address]

## 2023-11-06 NOTE — Progress Notes (Signed)
Progress Note   Patient: Elizabeth Rowland ZOX:[id number] DOB: 1941/04/29 DOA: 10/24/2023     13 DOS: the patient was seen and examined on 11/06/2023  Brief hospital course:  83 y.o. female with medical history significant of PE and DVT on Eliquis, HTN, HDL, DM, COPD, dCHf, anxiety, obesity, lymphedema, who presents with shortness of breath.  Per report, patient was found to have severe respiratory distress, with oxygen desaturation to 50% on room air, initially started on CPAP with 85% of saturation, then BiPAP started in ED with improvement.    02/07: admitted to hospitalist service, needing BiPap support into early 02/09 02/09-02/10: remains on [facility] O2 support  02/11: really not weaning down on high flow, will get CT chest, based on exam suspect may have pleural effusion, eval for PE would be prudent though she has been compliant w/ eliquis she has hx  2/19: Hemodynamically stable, able to wean FiO2 to 85%, remained on 60 L of heated high flow, clinically seems improving and does not want to give up stating that she has been through this multiple times.  Significant leukocytosis at 42.8-patient received very high doses of Solu-Medrol for 3 days, differential and smear review with mild left shift, 1 to 5% matters, polychromasia and target cells, CRP improved.  If leukocytosis continue to get worse we will involve hematology.  Continuing current level of care. 2/20: Patient was placed back to BiPAP due to worsening respiratory status, CRP started increasing after normalizing initially.  Improving leukocytosis. Patient is very high risk for mortality but wants to keep going and not ready for comfort care yet.     ASSESSMENT & PLAN:   Acute on chronic respiratory failure with hypoxia due to interstitial lung disease flare as well as possible multifocal pneumonia Elevated lactic acid more likely due to hypoxia.  Sepsis RULED OUT RVP nothing viral detected Continue supportive O2, wean as  tolerated Has completed a course of antibiotics including doxycycline and cefepime Continue bronchodilators as needed, Continue high flow nasal oxygenation Legionella and strep antigen negative Chest CT scan showing extensive infiltrate Continue steroid therapy as recommended by pulmonologist Patient is back on BiPAP with worsening respiratory status. Palliative care on board and at this point patient is not ready to transition to comfort care Continue high flow oxygen, pulmonologist have agreed with a target saturation above 80      COPD (chronic obstructive pulmonary disease) (HCC): Continue bronchodilators as as above   HLD (hyperlipidemia) Continue pravastatin   Diabetes mellitus without complication [facility]):  Recent A1c 8.1, at goal for age  Continue insulin therapy   Chronic diastolic CHF (congestive heart failure) (HCC) 2D echo on 01/08/2021 showed EF of 55 to 60% with grade 1 diastolic dysfunction.  Patient has had BMP 372, and chronic lateral leg lymphedema, cannot rule out CHF exacerbation. 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  Counseled on weight loss when medically stable   DVT prophylaxis: Eliquis  Code Status: DNI DNR   Barriers to dispo / significant pending items: respiratory status   Subjective  Patient was feeling more weaker today, she was on BiPAP and still having increased work of breathing.   Family Communication: Patient does not want to communicate with son due to his excessive anxiety      Physical Exam General.  Frail elderly lady, appears little anxious Pulmonary.  Harsh breath sounds bilaterally, increased work of breathing. CV.  Regular rate and rhythm, no JVD, rub or murmur. Abdomen.  Soft, nontender, nondistended, BS positive. CNS.  Alert and oriented .  No focal neurologic deficit. Extremities.  No edema, no cyanosis, pulses intact and symmetrical.    Data reviewed:    Latest Ref Rng & Units 11/06/2023    1:18 AM 11/05/2023    2:03 AM 11/04/2023    3:40 AM  CBC  WBC 4.0 - 10.5 K/uL 39.3  42.8  28.8   Hemoglobin 12.0 - 15.0 g/dL 8.9  8.3  8.5   Hematocrit 36.0 - 46.0 % 32.0  29.8  30.8   Platelets 150 - 400 K/uL 268  297  278        Latest Ref Rng & Units 11/06/2023    1:18 AM 11/05/2023    2:03 AM 11/04/2023    3:40 AM  BMP  Glucose 70 - 99 mg/dL 147  829  562   BUN 8 - 23 mg/dL 37  47  51   Creatinine 0.44 - 1.00 mg/dL 1.30  8.65  7.84   Sodium 135 - 145 mmol/L 142  144  142   Potassium 3.5 - 5.1 mmol/L 4.7  4.5  4.3   Chloride 98 - 111 mmol/L 102  104  100   CO2 22 - 32 mmol/L 31  33  33   Calcium 8.9 - 10.3 mg/dL 8.5  8.7  9.0      Vitals:   11/06/23 1305 11/06/23 1310 11/06/23 1400 11/06/23 1422  BP:   (!) 148/133   Pulse: 98 98 96   Resp: (!) 21 20 17    Temp:      TempSrc:      SpO2: 99% (!) 86% (!) 81% (!) 86%  Weight:      Height:       Disposition: Palliative care still engaging family and patient in discussion of goals of care with possible hopes of transitioning to comfort care measures Patient does not want to de-escalate care at this time  Author: Arnetha Courser, MD 11/06/2023 2:39 PM  For on call review [email or web address].

## 2023-11-07 ENCOUNTER — Inpatient Hospital Stay: Payer: 59

## 2023-11-07 DIAGNOSIS — J9601 Acute respiratory failure with hypoxia: Secondary | ICD-10-CM | POA: Diagnosis not present

## 2023-11-07 DIAGNOSIS — Z515 Encounter for palliative care: Secondary | ICD-10-CM | POA: Diagnosis not present

## 2023-11-07 DIAGNOSIS — J189 Pneumonia, unspecified organism: Secondary | ICD-10-CM | POA: Diagnosis not present

## 2023-11-07 DIAGNOSIS — J439 Emphysema, unspecified: Secondary | ICD-10-CM | POA: Diagnosis not present

## 2023-11-07 DIAGNOSIS — I5032 Chronic diastolic (congestive) heart failure: Secondary | ICD-10-CM | POA: Diagnosis not present

## 2023-11-07 DIAGNOSIS — E785 Hyperlipidemia, unspecified: Secondary | ICD-10-CM | POA: Diagnosis not present

## 2023-11-07 DIAGNOSIS — J9621 Acute and chronic respiratory failure with hypoxia: Secondary | ICD-10-CM | POA: Diagnosis not present

## 2023-11-07 LAB — RESPIRATORY PANEL BY PCR

## 2023-11-07 LAB — GLUCOSE, CAPILLARY
Glucose-Capillary: 162 mg/dL — ABNORMAL HIGH (ref 70–99)
Glucose-Capillary: 183 mg/dL — ABNORMAL HIGH (ref 70–99)
Glucose-Capillary: 166 mg/dL — ABNORMAL HIGH (ref 70–99)
Glucose-Capillary: 162 mg/dL — ABNORMAL HIGH (ref 70–99)

## 2023-11-07 LAB — C-REACTIVE PROTEIN: CRP: 2.7 mg/dL — ABNORMAL HIGH (ref <1.0)

## 2023-11-07 MED ORDER — INSULIN GLARGINE-YFGN 100 UNIT/ML ~~LOC~~ SOLN
12.0000 [IU] | Freq: Every day | SUBCUTANEOUS | Status: DC
  Administered 2023-11-07: 12 [IU] via SUBCUTANEOUS
  Filled 2023-11-07 (×2): qty 0.12

## 2023-11-07 MED ORDER — MELATONIN 5 MG PO TABS
5.0000 mg | ORAL_TABLET | Freq: Once | ORAL | Status: AC
  Administered 2023-11-07: 5 mg via ORAL
  Filled 2023-11-07: qty 1

## 2023-11-07 NOTE — Progress Notes (Signed)
Pt. Woke up and starting to get restless saying I can't breath help me help me . Stayed with pt. And reoriented provided and gave assurance FIO2 increase to 90% for sat dropping to the 78's closely monitored.0303 sat dropping and pt. Claimed can't breath sat on the 80's now on a 100%FIO2. RT made aware Morphine 2mg  given for respiratory distress and closely monitored.

## 2023-11-07 NOTE — Plan of Care (Signed)
  Problem: Coping: Goal: Ability to adjust to condition or change in health will improve Outcome: Not Progressing   Problem: Fluid Volume: Goal: Ability to maintain a balanced intake and output will improve Outcome: Not Progressing   Problem: Nutritional: Goal: Maintenance of adequate nutrition will improve Outcome: Not Progressing Goal: Progress toward achieving an optimal weight will improve Outcome: Not Progressing   Problem: Clinical Measurements: Goal: Respiratory complications will improve Outcome: Not Progressing

## 2023-11-07 NOTE — Progress Notes (Signed)
   11/07/23 1116  Spiritual Encounters  Type of Visit Follow up  Care provided to: Patient  Referral source Chaplain assessment  Reason for visit Routine spiritual support  OnCall Visit No  Spiritual Framework  Presenting Themes Other (comment) (Pt on bipap; unable to talk)  Interventions  Spiritual Care Interventions Made Compassionate presence  Intervention Outcomes  Outcomes Reduced isolation

## 2023-11-07 NOTE — Progress Notes (Signed)
Daily Progress Note   Patient Name: Elizabeth Rowland       Date: 11/07/2023 DOB: [birth year]  Age: 83 y.o. MRN#: [id number] Attending Physician: Arnetha Courser, MD Primary Care Physician: Shane Crutch, [place] Admit Date: 10/24/2023  Reason for Consultation/Follow-up: Establishing goals of care  HPI/Brief Hospital Review: 83 y.o. female  with past medical history of DVT/PE on Eliquis, HTN, HLD, CHF and ILD admitted from home on 10/24/2023 with severe respiratory distress and significant hypoxia. Admitted and being treated for acute on chronic respiratory failure due to multifocal PNA.   During hospitalization has required intermittent bipap and HHFNC support-remains on HHFNC with NRB in place as well as intermittent bipap use, respiratory status remain tenuous and she remains at a high risk for decline/decompensation   Palliative medicine was consulted for assisting with goals of care conversations.  Subjective: Extensive chart review has been completed prior to meeting patient including labs, vital signs, imaging, progress notes, orders, and available advanced directive documents from current and previous encounters.    Visited with Elizabeth Rowland at her bedside with nursing staff. Initially on bipap, able to be weaned to [facility] and NRB to have conversations. Attempted to assess mentation/capacity, Elizabeth Rowland able to appropriately respond to place and time but unable to share her understanding of situation. While attempting to elicit goals of care, Elizabeth Rowland closed eyes, responded with requests for sips of water. Provided sips of water, while off NRB oxygen saturations dropped into mid-upper 70's, recovered once NRB replaced.  Called and spoke with son-Elizabeth Rowland who again confirms Elizabeth Rowland has not  completed advanced directive documentation in the past. Elizabeth Rowland again confirms he has a brother Elizabeth Rowland, Elizabeth Rowland shares Elizabeth Rowland is not actively involved in caring for their mother. Explained without HCPOA, NOK would become surrogate decision maker and would include both Elizabeth Rowland and Elizabeth Rowland.  Returned to bedside with Elizabeth Rowland visiting. Elizabeth Rowland discussing concerns with his mother regarding an outside legal matter. Discussed with Elizabeth Rowland, hospital would not be able to provide advice, suggestions, notary or witness to outside legal documents. Elizabeth Rowland to seek professional legal advise for outside matters.  Attempted to elicit goals of care with Elizabeth Rowland as she is more alert with Elizabeth Rowland at her bedside. She is able to share her understanding of severity of underlying lung function, she is able to  acknowledge poor prognosis. We discussed comfort care and process of transitioning to comfort care, Elizabeth Rowland able to acknowledge understanding. Addressed surrogate decision maker, Elizabeth Rowland shares in the event she is unable to make or be involved in complex medical decision making she wishes to appoint her son-Elizabeth Rowland and would allow Elizabeth Rowland to decide at that time if he felt necessary to involved his brother-Elizabeth Rowland. Attempted to explain to Elizabeth Rowland legality of [employer], without HCPOA documents would include both Elizabeth Rowland and Elizabeth Rowland.  During our conversations, Elizabeth Rowland and Elizabeth Rowland most concerned about outside Water quality scientist. In relation to goals of care, Elizabeth Rowland to attempt to make contact with his brother Elizabeth Rowland and Elizabeth Rowland's granddaughter as she would like to communicate with each of them prior to decision making. Encouraged Elizabeth Rowland to contact his family and set a meeting time.  Answered and addressed all questions and concerns. PMT to continue to follow for ongoing needs and support.   Palliative Care Assessment & Plan   Assessment/Recommendation/Plan  High risk of decline/decompensation Time for outcomes and family decisions Poor  prognosis  Care plan was discussed with nursing staff, primary team and pulmonary provider  Thank you for allowing the Palliative Medicine Team to assist in the care of this patient.  Total time:  65 minutes  Time spent includes: Detailed review of medical records (labs, imaging, vital signs), medically appropriate exam (mental status, respiratory, cardiac, skin), discussed with treatment team, counseling and educating patient, family and staff, documenting clinical information, medication management and coordination of care.  Leeanne Deed, DNP, AGNP-C Palliative Medicine   Please contact Palliative Medicine Team phone at [phone] for questions and concerns.

## 2023-11-07 NOTE — Progress Notes (Signed)
Pulmonology Progress Note   Patient: Elizabeth Rowland ION:[id number] DOB: May 13, 1941 DOA: 10/24/2023     14 DOS: the patient was seen and examined on 11/07/2023   Brief hospital course:    11/07/23- patient is awake alert interactive.  She does not wish to de escalate therapy despite me explaining that she is with advanced age and extensive severe ILD with pulmonary fibrosis.  She remains on 100% O2 via HHFNC. Patient is not demented. She does have palliative care following.  CRP overnight is improved.     Narrative & Impression  CLINICAL DATA:  Pneumonia, complication suspected, xray done Diffuse/interstitial lung disease   EXAM: CT CHEST WITH CONTRAST   TECHNIQUE: Multidetector CT imaging of the chest was performed during intravenous contrast administration.   RADIATION DOSE REDUCTION: This exam was performed according to the departmental dose-optimization program which includes automated exposure control, adjustment of the mA and/or kV according to patient size and/or use of iterative reconstruction technique.   CONTRAST:  75mL OMNIPAQUE IOHEXOL 300 MG/ML  SOLN   COMPARISON:  X-ray 10/24/2023, CT 11/29/2021   FINDINGS: Cardiovascular: Mild cardiomegaly. Trace pericardial fluid. Lipomatous hypertrophy of the interatrial septum. Thoracic aorta is nonaneurysmal. Atherosclerotic calcifications of the aorta and coronary arteries. Central pulmonary vasculature is mildly dilated.   Mediastinum/Nodes: Multiple enlarged mediastinal and right hilar lymph nodes. Reference nodes include 1.6 cm lower right paratracheal node (series 2, image 49), 1.4 cm AP window node (series 2, image 53), 1.4 cm right hilar node (series 2, image 63). These nodes have all increased in size when compared to the prior CT from 2023. Several small left hilar lymph nodes are present. No axillary lymphadenopathy. 2.3 cm left thyroid lobe nodule. Trachea and esophagus within normal limits. Small hiatal  hernia.   Lungs/Pleura: Extensive airspace opacity throughout both lungs involving all lobes, findings are slightly worse within the right lung. Background of interstitial lung disease, not well assessed given the degree of airspace consolidation. No pleural effusion or pneumothorax.   Upper Abdomen: No acute abnormality.   Musculoskeletal: No chest wall abnormality. No acute or significant osseous findings.   IMPRESSION: 1. Extensive airspace opacity throughout both lungs, slightly worse within the right lung. Findings are most suspicious for multifocal pneumonia. 2. Background of interstitial lung disease, not well assessed given the degree of airspace consolidation. Consider follow-up high-resolution chest CT on an outpatient basis following the resolution of patient's acute symptoms. 3. Mediastinal and right hilar lymphadenopathy, increased in size when compared to the prior CT from 2023. Findings are favored to be reactive. 4. Mild cardiomegaly with mild dilation of the central pulmonary vasculature, suggesting pulmonary arterial hypertension. 5. Aortic and coronary artery atherosclerosis (ICD10-I70.0). 6. Incidentally noted 2.3 cm left thyroid lobe nodule. Recommend thyroid US (ref: J Am Coll Radiol. 2015 Feb;12(2): 143-50).     Electronically Signed   By: Duanne Guess D.O.   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 -completed pulsed dose steroids now weaning protocol Elevated lactic acid has resolved  Sepsis RULED OUT RVP 10/26/2023:   negative Reviewed ABG with hypercapnic/hypoxemic resp failure, poor prognosis Legionella and strep pneumococcal antigen - Negative Blood Cultures Negative 10/30/2023 CT and chest x-ray concerning for multifocal pneumonia Continue supportive O2,  Currently on high flow [place] currently at FiO2 94%  - wean as tolerated - Continue IV steroids now down to  solumedrol 125 bid IV Continue bronchodilators  Acute exacerbation of Interstitial Lung Disease: Bronchodilators - Albuterol Neb Q8H with Chest physiotherapy - MedaNeb when able Methylprednisone IV - pulse dose 1g x 3d Aggressive Incentive spirometry use with cough ABG serial performed and reviewed   Chronic normocytic anemai Goal of Hemoglobin > 8 - currently 7.4  transfusion of 1 unit of PRBC per hospital protocol    HLD (hyperlipidemia) Continue pravastatin   Diabetes mellitus without complication (HCC):  Recent A1c 8.1, at goal for age  Continue insulin therapy   Chronic diastolic CHF (congestive heart failure) (HCC) 2D echo on 01/08/2021 showed EF of 55 to 60% with grade 1 diastolic dysfunction.  Patient has had BMP 372, and chronic lateral leg lymphedema, cannot rule out CHF exacerbation. 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  Counseled on weight loss when medically stable    Code Status:DNR/DNI   Barriers to dispo / significant pending items: respiratory status   Family Communication: Family at bedside - Granddaughter and Son at bedside. Discussed Respiratory status and answered all questions of concern    Physical Exam Constitutional:      General: She is not in acute distress. Cardiovascular:     Rate and Rhythm: Normal rate and regular rhythm.  Pulmonary: Air entry decreased bilaterally.  Wheezing in upper lobes and diminished bases.   Extremities:  Mild bilateral lower extremity edema.  Neurological:     Mental Status: [name]. Mental status is at baseline.  Psychiatric:        Mood and Affect:  Mood normal.        Behavior: Behavior normal.     Vitals:   11/07/23 0756 11/07/23 0758 11/07/23 0800 11/07/23 0812  BP:   (!) 142/63   Pulse:  (!) 102 100 (!) 107  Resp:  (!) 23 (!) 27 (!) 21  Temp:  98 F (36.7 C)    TempSrc:  Axillary    SpO2: (!) 80% (!) 84% (!) 77% (!) 78%  Weight:      Height:        Data Reviewed:    Latest Ref Rng & Units 11/06/2023    1:18 AM 11/05/2023    2:03 AM 11/04/2023    3:40 AM  BMP  Glucose 70 - 99 mg/dL 956  213  086   BUN 8 - 23 mg/dL 37  47  51   Creatinine 0.44 - 1.00 mg/dL 5.78  4.69  6.29   Sodium 135 - 145 mmol/L 142  144  142   Potassium 3.5 - 5.1 mmol/L 4.7  4.5  4.3   Chloride 98 - 111 mmol/L 102  104  100   CO2 22 - 32 mmol/L 31  33  33   Calcium 8.9 - 10.3 mg/dL 8.5  8.7  9.0        Latest Ref Rng & Units 11/06/2023    1:18 AM 11/05/2023    2:03 AM 11/04/2023    3:40 AM  CBC  WBC 4.0 - 10.5 K/uL 39.3  42.8  28.8   Hemoglobin 12.0 - 15.0 g/dL  8.9  8.3  8.5   Hematocrit 36.0 - 46.0 % 32.0  29.8  30.8   Platelets 150 - 400 K/uL 268  297  278        Lab Results WBC  Date/Time Value Ref Range Status  11/06/2023 01:18 AM 39.3 (H) 4.0 - 10.5 K/uL Final  11/05/2023 02:03 AM 42.8 (H) 4.0 - 10.5 K/uL Final  11/04/2023 03:40 AM 28.8 (H) 4.0 - 10.5 K/uL Final   Neutrophils Relative %  Date/Time Value Ref Range Status  11/05/2023 02:03 AM 69 % Final  11/04/2023 03:40 AM 77 % Final  11/03/2023 04:39 AM 78 % Final   pCO2 arterial  Date/Time Value Ref Range Status  11/03/2023 06:08 AM 52 (H) 32 - 48 mmHg Final  11/01/2023 03:52 PM 53 (H) 32 - 48 mmHg Final  10/31/2023 10:43 AM 48 32 - 48 mmHg Final   Lactic Acid, Venous  Date/Time Value Ref Range Status  10/25/2023 07:42 PM 1.8 0.5 - 1.9 mmol/L Final    Comment:    Performed at [facility], [address] [place], [place] [address]  10/25/2023 12:18 AM 1.2 0.5 - 1.9 mmol/L Final    Comment:    Performed at [facility], [address]  [place], [place] [address]  10/24/2023 06:15 PM 1.3 0.5 - 1.9 mmol/L Final    Comment:    Performed at [facility], [address] [place], [place] [address]   pCO2, Ven  Date/Time Value Ref Range Status  11/04/2023 06:49 PM 51 44 - 60 mmHg Final     Critical care provider statement:   Total critical care time: 33 minutes   Performed by: Karna Christmas MD   Critical care time was exclusive of separately billable procedures and treating other patients.   Critical care was necessary to treat or prevent imminent or life-threatening deterioration.   Critical care was time spent personally by me on the following activities: development of treatment plan with patient and/or surrogate as well as nursing, discussions with consultants, evaluation of patient's response to treatment, examination of patient, obtaining history from patient or surrogate, ordering and performing treatments and interventions, ordering and review of laboratory studies, ordering and review of radiographic studies, pulse oximetry and re-evaluation of patient's condition.    Vida Rigger, M.D.  Pulmonary & Critical Care Medicine      For on call review [email or web address].

## 2023-11-07 NOTE — Plan of Care (Signed)
  Problem: Education: Goal: Ability to describe self-care measures that may prevent or decrease complications (Diabetes Survival Skills Education) will improve Outcome: Progressing   Problem: Coping: Goal: Ability to adjust to condition or change in health will improve Outcome: Progressing   Problem: Fluid Volume: Goal: Ability to maintain a balanced intake and output will improve Outcome: Progressing   Problem: Health Behavior/Discharge Planning: Goal: Ability to identify and utilize available resources and services will improve Outcome: Progressing Goal: Ability to manage health-related needs will improve Outcome: Progressing   Problem: Nutritional: Goal: Maintenance of adequate nutrition will improve Outcome: Progressing Goal: Progress toward achieving an optimal weight will improve Outcome: Progressing   Problem: Skin Integrity: Goal: Risk for impaired skin integrity will decrease Outcome: Progressing   Problem: Tissue Perfusion: Goal: Adequacy of tissue perfusion will improve Outcome: Progressing   Problem: Health Behavior/Discharge Planning: Goal: Ability to manage health-related needs will improve Outcome: Progressing   Problem: Clinical Measurements: Goal: Will remain free from infection Outcome: Progressing Goal: Diagnostic test results will improve Outcome: Progressing Goal: Respiratory complications will improve Outcome: Progressing   Problem: Activity: Goal: Risk for activity intolerance will decrease Outcome: Progressing   Problem: Coping: Goal: Level of anxiety will decrease Outcome: Progressing

## 2023-11-07 NOTE — Progress Notes (Signed)
Patient on and off bipap to HHFNC- does not tolerate being off bipap very long approximately 10 min- then will desat to the 70's and 60's.  Patient very anxious- always requesting water- I told her she can not keep drinking water when she can't stay off bipap too long in risk of aspirating.  Patient always stating to help her because she "can't breathe" and she also states that she "is dying."  This information has been relayed to the care team- Dr. Nelson Chimes, Dr. Karna Christmas, and Ladona Ridgel with Palliative.  Ladona Ridgel stated that she spoke with the patient's son [name] and that he will be visiting the patient this afternoon- I will notify Ladona Ridgel when he gets here so that they can have a meeting.

## 2023-11-07 NOTE — Progress Notes (Signed)
Progress Note   Patient: Elizabeth Rowland:[id number] DOB: September 11, 1941 DOA: 10/24/2023     14 DOS: the patient was seen and examined on 11/07/2023  Brief hospital course:  83 y.o. female with medical history significant of PE and DVT on Eliquis, HTN, HDL, DM, COPD, dCHf, anxiety, obesity, lymphedema, who presents with shortness of breath.  Per report, patient was found to have severe respiratory distress, with oxygen desaturation to 50% on room air, initially started on CPAP with 85% of saturation, then BiPAP started in ED with improvement.    02/07: admitted to hospitalist service, needing BiPap support into early 02/09 02/09-02/10: remains on [facility] [place] O2 support  02/11: really not weaning down on high flow, will get CT chest, based on exam suspect may have pleural effusion, eval for PE would be prudent though she has been compliant w/ eliquis she has hx  2/19: Hemodynamically stable, able to wean FiO2 to 85%, remained on 60 L of heated high flow, clinically seems improving and does not want to give up stating that she has been through this multiple times.  Significant leukocytosis at 42.8-patient received very high doses of Solu-Medrol for 3 days, differential and smear review with mild left shift, 1 to 5% matters, polychromasia and target cells, CRP improved.  If leukocytosis continue to get worse we will involve hematology.  Continuing current level of care. 2/20: Patient was placed back to BiPAP due to worsening respiratory status, CRP started increasing after normalizing initially.  Improving leukocytosis. Patient is very high risk for mortality but wants to keep going and not ready for comfort care yet. 2/21: Patient remained in respiratory distress, barely saturating in mid 80s on BiPAP with maximum setting.  Repeat RVP was negative, Solu-Medrol dose was increased to 125 mg twice daily by pulmonary yesterday.  CRP at 2.7.  Palliative care again met with her but she wants to keep going      ASSESSMENT & PLAN:   Acute on chronic respiratory failure with hypoxia due to interstitial lung disease flare as well as possible multifocal pneumonia Elevated lactic acid more likely due to hypoxia.  Sepsis RULED OUT RVP remain negative Continue supportive O2, wean as tolerated Has completed a course of antibiotics including doxycycline and cefepime Continue bronchodilators as needed, Continue high flow nasal oxygenation Legionella and strep antigen negative Chest CT scan showing extensive infiltrate Continue steroid therapy as recommended by pulmonologist Patient is back on BiPAP with worsening respiratory status. Palliative care on board and at this point patient is not ready to transition to comfort care Continue high flow oxygen, pulmonologist have agreed with a target saturation above 80      COPD (chronic obstructive pulmonary disease) (HCC): Continue bronchodilators as as above   HLD (hyperlipidemia) Continue pravastatin   Diabetes mellitus without complication [facility]):  Recent A1c 8.1, at goal for age  Continue insulin therapy   Chronic diastolic CHF (congestive heart failure) (HCC) 2D echo on 01/08/2021 showed EF of 55 to 60% with grade 1 diastolic dysfunction.  Patient has had BMP 372, and chronic lateral leg lymphedema, cannot rule out CHF exacerbation. 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  Counseled on weight loss when medically stable   DVT prophylaxis: Eliquis   Code Status: DNI DNR   Barriers to  dispo / significant pending items: respiratory status   Subjective  Patient was very lethargic and having difficulty tolerating BiPAP now.  She was on maximum strength of heated high flow along with nonrebreather and barely saturating in low to mid 80s.  Still does not want to give up.   Family Communication: Talked with son on phone.     Physical Exam General.  Frail elderly lady, appears little anxious Pulmonary.  Harsh breath sounds bilaterally, increased work of breathing. CV.  Regular rate and rhythm, no JVD, rub or murmur. Abdomen.  Soft, nontender, nondistended, BS positive. CNS.  Alert and oriented .  No focal neurologic deficit. Extremities.  No edema, no cyanosis, pulses intact and symmetrical.    Data reviewed:    Latest Ref Rng & Units 11/06/2023    1:18 AM 11/05/2023    2:03 AM 11/04/2023    3:40 AM  CBC  WBC 4.0 - 10.5 K/uL 39.3  42.8  28.8   Hemoglobin 12.0 - 15.0 g/dL 8.9  8.3  8.5   Hematocrit 36.0 - 46.0 % 32.0  29.8  30.8   Platelets 150 - 400 K/uL 268  297  278        Latest Ref Rng & Units 11/06/2023    1:18 AM 11/05/2023    2:03 AM 11/04/2023    3:40 AM  BMP  Glucose 70 - 99 mg/dL 045  409  811   BUN 8 - 23 mg/dL 37  47  51   Creatinine 0.44 - 1.00 mg/dL 9.14  7.82  9.56   Sodium 135 - 145 mmol/L 142  144  142   Potassium 3.5 - 5.1 mmol/L 4.7  4.5  4.3   Chloride 98 - 111 mmol/L 102  104  100   CO2 22 - 32 mmol/L 31  33  33   Calcium 8.9 - 10.3 mg/dL 8.5  8.7  9.0      Vitals:   11/07/23 1130 11/07/23 1200 11/07/23 1233 11/07/23 1310  BP:  (!) 154/59    Pulse: (!) 109 (!) 102 97   Resp: (!) 25 (!) 26 (!) 24   Temp:   98.1 F (36.7 C)   TempSrc:   Axillary   SpO2: 95% (!) 87% (!) 89% (!) 80%  Weight:      Height:       Disposition: Palliative care still engaging family and patient in discussion of goals of care with possible hopes of transitioning to comfort care measures Patient does not want to de-escalate care at this time  Author: Arnetha Courser, MD 11/07/2023 2:24 PM  For on call review [email or web address].

## 2023-11-08 DIAGNOSIS — J439 Emphysema, unspecified: Secondary | ICD-10-CM | POA: Diagnosis not present

## 2023-11-08 DIAGNOSIS — E785 Hyperlipidemia, unspecified: Secondary | ICD-10-CM | POA: Diagnosis not present

## 2023-11-08 DIAGNOSIS — J9621 Acute and chronic respiratory failure with hypoxia: Secondary | ICD-10-CM | POA: Diagnosis not present

## 2023-11-08 DIAGNOSIS — J9601 Acute respiratory failure with hypoxia: Secondary | ICD-10-CM | POA: Diagnosis not present

## 2023-11-08 DIAGNOSIS — I5032 Chronic diastolic (congestive) heart failure: Secondary | ICD-10-CM | POA: Diagnosis not present

## 2023-11-08 DIAGNOSIS — J189 Pneumonia, unspecified organism: Secondary | ICD-10-CM | POA: Diagnosis not present

## 2023-11-08 LAB — C-REACTIVE PROTEIN: CRP: 1.1 mg/dL — ABNORMAL HIGH (ref <1.0)

## 2023-11-08 LAB — GLUCOSE, CAPILLARY: Glucose-Capillary: 163 mg/dL — ABNORMAL HIGH (ref 70–99)

## 2023-11-08 MED ORDER — HYDROMORPHONE HCL 1 MG/ML IJ SOLN
INTRAMUSCULAR | Status: AC
  Administered 2023-11-08: 0.5 mg via INTRAVENOUS
  Filled 2023-11-08: qty 1

## 2023-11-08 MED ORDER — HYDROMORPHONE HCL-NACL 50-0.9 MG/50ML-% IV SOLN
0.5000 mg/h | INTRAVENOUS | Status: DC
  Administered 2023-11-08: 0.5 mg/h via INTRAVENOUS
  Filled 2023-11-08: qty 50

## 2023-11-08 MED ORDER — LORAZEPAM 2 MG/ML IJ SOLN: 1.0000 mg | INTRAMUSCULAR | Status: DC | PRN

## 2023-11-08 MED ORDER — LORAZEPAM 2 MG/ML IJ SOLN
1.0000 mg | Freq: Once | INTRAMUSCULAR | Status: AC
  Administered 2023-11-08: 1 mg via INTRAVENOUS

## 2023-11-08 MED ORDER — GLYCOPYRROLATE 0.2 MG/ML IJ SOLN: 0.2000 mg | INTRAMUSCULAR | Status: DC | PRN

## 2023-11-08 MED ORDER — POLYVINYL ALCOHOL 1.4 % OP SOLN: 1.0000 [drp] | Freq: Four times a day (QID) | OPHTHALMIC | Status: DC | PRN

## 2023-11-08 MED ORDER — HYDROMORPHONE HCL 1 MG/ML IJ SOLN: 0.5000 mg | Freq: Once | INTRAMUSCULAR | Status: AC

## 2023-11-08 MED ORDER — GLYCOPYRROLATE 1 MG PO TABS: 1.0000 mg | ORAL_TABLET | ORAL | Status: DC | PRN

## 2023-11-08 MED ORDER — HYDROMORPHONE BOLUS VIA INFUSION
1.0000 mg | INTRAVENOUS | Status: DC | PRN
  Administered 2023-11-08 (×3): 1 mg via INTRAVENOUS

## 2023-11-08 MED ORDER — BIOTENE DRY MOUTH MT LIQD: 15.0000 mL | OROMUCOSAL | Status: DC | PRN

## 2023-11-08 MED ORDER — LORAZEPAM 2 MG/ML IJ SOLN
INTRAMUSCULAR | Status: AC
Start: 2023-11-08 — End: 2023-11-08
  Filled 2023-11-08: qty 1

## 2023-11-08 NOTE — Progress Notes (Signed)
 Progress Note   Patient: Elizabeth Rowland:[id number] DOB: 1941-07-09 DOA: 10/24/2023     15 DOS: the patient was seen and examined on 11/08/2023  Brief hospital course:  83 y.o. female with medical history significant of PE and DVT on Eliquis, HTN, HDL, DM, COPD, dCHf, anxiety, obesity, lymphedema, who presents with shortness of breath.  Per report, patient was found to have severe respiratory distress, with oxygen desaturation to 50% on room air, initially started on CPAP with 85% of saturation, then BiPAP started in ED with improvement.    02/07: admitted to hospitalist service, needing BiPap support into early 02/09 02/09-02/10: remains on [facility] [place] O2 support  02/11: really not weaning down on high flow, will get CT chest, based on exam suspect may have pleural effusion, eval for PE would be prudent though she has been compliant w/ eliquis she has hx  2/19: Hemodynamically stable, able to wean FiO2 to 85%, remained on 60 L of heated high flow, clinically seems improving and does not want to give up stating that she has been through this multiple times.  Significant leukocytosis at 42.8-patient received very high doses of Solu-Medrol for 3 days, differential and smear review with mild left shift, 1 to 5% matters, polychromasia and target cells, CRP improved.  If leukocytosis continue to get worse we will involve hematology.  Continuing current level of care. 2/20: Patient was placed back to BiPAP due to worsening respiratory status, CRP started increasing after normalizing initially.  Improving leukocytosis. Patient is very high risk for mortality but wants to keep going and not ready for comfort care yet. 2/21: Patient remained in respiratory distress, barely saturating in mid 80s on BiPAP with maximum setting.  Repeat RVP was negative, Solu-Medrol dose was increased to 125 mg twice daily by pulmonary yesterday.  CRP at 2.7.  Palliative care again met with her but she wants to keep going 2/22:  No change in respiratory status, patient apparently pulled her BiPAP mask momentarily and saturation dropped in 40s resulted in a lot of anxiety and dyspnea, she told nurse that she is tired and wants to be just comfortable now.  She was given a dose of morphine followed by a small dose of Ativan to decrease anxiety.  Lengthy discussion with 2 sons at bedside, apparently she told one of her son yesterday afternoon that she is getting tired.  We have maximized treatment.  Patient has extensive ILD, she was DNR and DNI. She is being transitioned to full comfort care, started on Dilaudid infusion as morphine was not helping her much. Anticipating hospital death.     ASSESSMENT & PLAN:   Acute on chronic respiratory failure with hypoxia due to interstitial lung disease flare as well as possible multifocal pneumonia Elevated lactic acid more likely due to hypoxia.  Sepsis RULED OUT RVP remain negative Continue supportive O2, wean as tolerated Has completed a course of antibiotics including doxycycline and cefepime Continue bronchodilators as needed, Continue high flow nasal oxygenation Legionella and strep antigen negative Chest CT scan showing extensive infiltrate Continue steroid therapy as recommended by pulmonologist Patient is back on BiPAP with worsening respiratory status. Continue high flow oxygen, pulmonologist have agreed with a target saturation above 80 Patient continued to have worsening respiratory status, difficulty tolerating BiPAP but desaturated pretty quickly even when mask was removed for couple of second. Patient and family eventually made the decision to proceed with full comfort care. Anticipating hospital death      COPD (chronic obstructive  pulmonary disease) (HCC): Continue bronchodilators as as above   HLD (hyperlipidemia) Patient is being transitioned to full comfort care now.   Diabetes mellitus without complication [facility]):  Recent A1c 8.1, at goal for age   Continue insulin therapy   Chronic diastolic CHF (congestive heart failure) (HCC) 2D echo on 01/08/2021 showed EF of 55 to 60% with grade 1 diastolic dysfunction.  Patient has had BMP 372, and chronic lateral leg lymphedema, cannot rule out CHF exacerbation. 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  Counseled on weight loss when medically stable   DVT prophylaxis: Full comfort care patient   Code Status: DNI DNR   Barriers to dispo / significant pending items: respiratory status   Subjective  Patient had another episode of extreme dyspnea, anxiety and desaturation in 40s earlier today.  She removed her BiPAP mask for few second herself.  She was later sleeping and appears comfortable after getting a dose of morphine and Ativan.  Both sons and other family members at bedside.  Lengthy discussion about trajectory of her disease and her wish in front of nursing staff to make her comfortable.  Per son she expressed the same yesterday evening with him stating that now she is getting tired of this.   Family Communication: Discussed with 2 sons and other family members at bedside   Physical Exam General.  Lethargic and sedated patient, in no acute distress. Pulmonary.  Harsh breath sounds bilaterally, normal respiratory effort. CV.  Regular rate and rhythm, no JVD, rub or murmur. Abdomen.  Soft, nontender, nondistended, BS positive. CNS.  Lethargic and sedated, not following any commands Extremities.   No edema, no cyanosis, pulses intact and symmetrical.    Data reviewed:    Latest Ref Rng & Units 11/06/2023    1:18 AM 11/05/2023    2:03 AM 11/04/2023    3:40 AM  CBC  WBC 4.0 - 10.5 K/uL 39.3  42.8  28.8   Hemoglobin 12.0 - 15.0 g/dL 8.9  8.3  8.5   Hematocrit 36.0 - 46.0 % 32.0  29.8  30.8   Platelets 150 - 400 K/uL 268  297  278        Latest Ref Rng & Units 11/06/2023    1:18 AM 11/05/2023    2:03 AM 11/04/2023    3:40 AM  BMP  Glucose 70 - 99 mg/dL 621  308  657   BUN 8 - 23 mg/dL 37  47  51   Creatinine 0.44 - 1.00 mg/dL 8.46  9.62  9.52   Sodium 135 - 145 mmol/L 142  144  142   Potassium 3.5 - 5.1 mmol/L 4.7  4.5  4.3   Chloride 98 - 111 mmol/L 102  104  100   CO2 22 - 32 mmol/L 31  33  33   Calcium 8.9 - 10.3 mg/dL 8.5  8.7  9.0  Vitals:   11/08/23 0900 11/08/23 1000 11/08/23 1100 11/08/23 1200  BP: (!) 118/50 (!) 133/51 (!) 113/55 (!) 153/42  Pulse: 97 93 92 98  Resp: 17 18 15  (!) 24  Temp:      TempSrc:      SpO2: 94% 98% 100% 97%  Weight:      Height:       Disposition: Patient is being transitioned to full comfort care.  Anticipating hospital death  Author: Arnetha Courser, MD 11/08/2023 1:40 PM  For on call review [email or web address].

## 2023-11-08 NOTE — Plan of Care (Signed)
 Problem: Education: Goal: Ability to describe self-care measures that may prevent or decrease complications (Diabetes Survival Skills Education) will improve Outcome: Progressing Goal: Individualized Educational Video(s) Outcome: Progressing   Problem: Fluid Volume: Goal: Ability to maintain a balanced intake and output will improve Outcome: Progressing   Problem: Health Behavior/Discharge Planning: Goal: Ability to identify and utilize available resources and services will improve Outcome: Progressing   Problem: Metabolic: Goal: Ability to maintain appropriate glucose levels will improve Outcome: Progressing   Problem: Nutritional: Goal: Progress toward achieving an optimal weight will improve Outcome: Progressing   Problem: Skin Integrity: Goal: Risk for impaired skin integrity will decrease Outcome: Progressing   Problem: Tissue Perfusion: Goal: Adequacy of tissue perfusion will improve Outcome: Progressing   Problem: Education: Goal: Knowledge of General Education information will improve Description: Including pain rating scale, medication(s)/side effects and non-pharmacologic comfort measures Outcome: Progressing   Problem: Clinical Measurements: Goal: Will remain free from infection Outcome: Progressing Goal: Diagnostic test results will improve Outcome: Progressing Goal: Cardiovascular complication will be avoided Outcome: Progressing   Problem: Nutrition: Goal: Adequate nutrition will be maintained Outcome: Progressing   Problem: Elimination: Goal: Will not experience complications related to bowel motility Outcome: Progressing Goal: Will not experience complications related to urinary retention Outcome: Progressing   Problem: Pain Managment: Goal: General experience of comfort will improve and/or be controlled Outcome: Progressing   Problem: Safety: Goal: Ability to remain free from injury will improve Outcome: Progressing   Problem: Skin  Integrity: Goal: Risk for impaired skin integrity will decrease Outcome: Progressing   Problem: Education: Goal: Ability to demonstrate management of disease process will improve Outcome: Progressing Goal: Ability to verbalize understanding of medication therapies will improve Outcome: Progressing Goal: Individualized Educational Video(s) Outcome: Progressing   Problem: Cardiac: Goal: Ability to achieve and maintain adequate cardiopulmonary perfusion will improve Outcome: Progressing   Problem: Education: Goal: Knowledge of disease or condition will improve Outcome: Progressing Goal: Knowledge of the prescribed therapeutic regimen will improve Outcome: Progressing Goal: Individualized Educational Video(s) Outcome: Progressing   Problem: Activity: Goal: Will verbalize the importance of balancing activity with adequate rest periods Outcome: Progressing   Problem: Respiratory: Goal: Ability to maintain a clear airway will improve Outcome: Progressing   Problem: Clinical Measurements: Goal: Ability to maintain a body temperature in the normal range will improve Outcome: Progressing   Problem: Respiratory: Goal: Ability to maintain a clear airway will improve Outcome: Progressing   Problem: Education: Goal: Knowledge of the prescribed therapeutic regimen will improve Outcome: Progressing   Problem: Coping: Goal: Ability to identify and develop effective coping behavior will improve Outcome: Progressing   Problem: Clinical Measurements: Goal: Quality of life will improve Outcome: Progressing   Problem: Coping: Goal: Ability to adjust to condition or change in health will improve Outcome: Not Progressing   Problem: Health Behavior/Discharge Planning: Goal: Ability to manage health-related needs will improve Outcome: Not Progressing   Problem: Nutritional: Goal: Maintenance of adequate nutrition will improve Outcome: Not Progressing   Problem: Health  Behavior/Discharge Planning: Goal: Ability to manage health-related needs will improve Outcome: Not Progressing   Problem: Clinical Measurements: Goal: Ability to maintain clinical measurements within normal limits will improve Outcome: Not Progressing Goal: Respiratory complications will improve Outcome: Not Progressing   Problem: Activity: Goal: Risk for activity intolerance will decrease Outcome: Not Progressing   Problem: Coping: Goal: Level of anxiety will decrease Outcome: Not Progressing   Problem: Activity: Goal: Capacity to carry out activities will improve Outcome: Not Progressing   Problem:  Activity: Goal: Ability to tolerate increased activity will improve Outcome: Not Progressing   Problem: Respiratory: Goal: Levels of oxygenation will improve Outcome: Not Progressing Goal: Ability to maintain adequate ventilation will improve Outcome: Not Progressing   Problem: Activity: Goal: Ability to tolerate increased activity will improve Outcome: Not Progressing   Problem: Respiratory: Goal: Ability to maintain adequate ventilation will improve Outcome: Not Progressing

## 2023-11-08 NOTE — Progress Notes (Signed)
 Palliative Care Progress Note, Assessment & Plan   Patient Name: Elizabeth Rowland       Date: 11/08/2023 DOB: [birth year]  Age: 83 y.o. MRN#: [id number] Attending Physician: Arnetha Courser, MD Primary Care Physician: Shane Crutch, [place] Admit Date: 10/24/2023  Subjective: Patient is lying in bed with BiPAP in place.  She has increased work of breathing, signs of mild respiratory distress with use of accessory muscles, and is unable to participate in goals of care medical decision making independently at this time.  During my visit, patient's two sons Jillyn Hidden and [name] as well as their wives were present.  HPI: 83 y.o. female  with past medical history of DVT/PE on Eliquis, HTN, HLD, CHF and ILD admitted from home on 10/24/2023 with severe respiratory distress and significant hypoxia. Admitted and being treated for acute on chronic respiratory failure due to multifocal PNA.   During hospitalization has required intermittent bipap and [facility] support-remains on HHF[facility] with NRB in place as well as intermittent bipap use, respiratory status remain tenuous and she remains at a high risk for decline/decompensation   Palliative medicine was consulted for assisting with goals of care conversations.  Summary of counseling/coordination of care: Extensive chart review completed prior to meeting patient including labs, vital signs, imaging, progress notes, orders, and available advanced directive documents from current and previous encounters.   After reviewing the patient's chart and assessing the patient at bedside, I spoke with patient and her family in regards to symptom management and goals of care.   Family shares they met with Dr. Nelson Chimes earlier this morning.  I attempted to gauge their understanding of patient's current  medical situation.  [name] states that they know the patient is not going to recover, she is suffering in her current state, and that they do not wish for her to be in pain.  He shares that he was hopeful that she would "bounce back from all of this" as she has in the past.  Space and opportunity provided for [name] to share his thoughts and emotions regarding his mother's current medical situation.  Patient's family member Wynona Canes at bedside vocalized that the patient has suffered enough, has already shared that she is tired, and does not want to be kept in this state.  Wynona Canes speaks of patient suffering, being in agony, and not being comfortable in her current state.  Therapeutic silence, active listening, and emotional support provided.  I shared my concern that patient has shown no sign of improvement and has continued to decompensate despite aggressive medical interventions.  I highlighted that patient shared with RN earlier today that she is "tired" and "think she is going to die soon".  Patient removed BiPAP herself this morning and quickly desaturated to 40s.  Light of patient saying herself that she is tired and family wishing for patient not to suffer, we discussed comfort measures in detail. Mrs. Mitter would no longer receive aggressive medical interventions such as continuous vital signs, lab work, radiology testing, or medications not focused on comfort.   All care would focus on how she is looking and feeling. This would include management of any symptoms that may cause discomfort, pain, shortness of breath, cough, nausea,  agitation, anxiety, and/or secretions etc. Symptoms would be managed with medications and other non-pharmacological interventions such as spiritual support if requested, repositioning, music therapy, or therapeutic listening.   Family verbalized understanding and appreciation.  They are all in agreement that they wish to move forward with comfort measures at this time.   They requested that a chaplain visit with patient prior to shifting to full comfort measures.  After meeting with patient and family, I notified RN and attending that family wishes to move forward with comfort measures once chaplain has visited.  Orders placed to reflect full comfort measures and discussed that these will be carried out only once chaplain has visit.  Discussed plan of care with RN.    Given that patient quickly desaturates with removal of BiPAP, and that past as needed doses of morphine have failed to appropriately manage patient's shortness of breath and air hunger, I initiated a Dilaudid gtt. with ability to bolus from bag.  Attending in agreement.  PMT will continue to follow and support patient throughout her hospitalization.  Full comfort measures.  I anticipate in-hospital death.  Physical Exam Vitals reviewed.  Constitutional:      General: She is in acute distress.     Appearance: She is ill-appearing. She is not toxic-appearing.  HENT:     Head: Normocephalic.     Mouth/Throat:     Mouth: Mucous membranes are dry.  Pulmonary:     Comments: Bipap, accessory muscle use, increased WOB Abdominal:     Palpations: Abdomen is soft.  Skin:    General: Skin is warm and dry.  Psychiatric:     Comments: nonverbal             Total Time 75 minutes   Time spent includes: Detailed review of medical records (labs, imaging, vital signs), medically appropriate exam (mental status, respiratory, cardiac, skin), discussed with treatment team, counseling and educating patient, family and staff, documenting clinical information, medication management and coordination of care.  Samara Deist L. [name], DNP, FNP-BC Palliative Medicine Team

## 2023-11-08 NOTE — Progress Notes (Signed)
 Notified Dr. Nelson Chimes and patient's son [name]- that patient oxygen sats are in the high 70's at best- patient stating "help me" and "I'm dying." I gave the patient morphine which does not help with her respiratory distress. I asked patient if she was ready to be made comfortable- like the conversation she had yesterday with [name] and Ladona Ridgel with Palliative- were she can get medication to make her comfortable and she can get off the bipap mask.  She said she was ready- so I relayed this information to Dr. Nelson Chimes and [name].  [name] sated he would be here in about 30 min- would let his other brother knowHenreitta Cea and patient's niece would be visiting today as well.  I told the patient that her family is on the way- I then gave her a dose of ativan which has helped tremendously with her anxiety.  Patient resting comfortably- sleeping- oxygen sats are now 88%.

## 2023-11-08 NOTE — Progress Notes (Signed)
   11/08/23 1120  Spiritual Encounters  Type of Visit Initial  Care provided to: Pt and family  Referral source Nurse (RN/NT/LPN)  Reason for visit Urgent spiritual support  OnCall Visit No   Chaplain responded to a request for prayer. The patient Elizabeth Rowland with her family who requested the prayer. I prayed for Elizabeth Rowland and family as well as they navigate  this journey together. Her son said that Elizabeth Rowland has been struggling to breath due to her illness and it is not expected to improve.   Elizabeth Rowland [facility]  [phone]

## 2023-11-08 NOTE — Progress Notes (Addendum)
 Entered patients room, family at bedside. Patients eyes closed, sitting up in bed with high flow nasal cannula. No response to voice. Lips cyanotic. Edema noted in left extremity and hand. Edema noted in bilateral legs as well as mottling of feet. Respirations 11. Dilaudid infusion running at 1mg /hr.

## 2023-11-08 NOTE — Progress Notes (Signed)
 Entered patients room. Family remains at bedside. Respirations 10. No needs identified.

## 2023-11-09 DIAGNOSIS — J9601 Acute respiratory failure with hypoxia: Principal | ICD-10-CM

## 2023-11-09 DIAGNOSIS — J189 Pneumonia, unspecified organism: Secondary | ICD-10-CM | POA: Diagnosis not present

## 2023-11-09 DIAGNOSIS — I5A Non-ischemic myocardial injury (non-traumatic): Secondary | ICD-10-CM | POA: Diagnosis not present

## 2023-11-09 DIAGNOSIS — J9621 Acute and chronic respiratory failure with hypoxia: Secondary | ICD-10-CM | POA: Diagnosis not present

## 2023-11-10 ENCOUNTER — Encounter (INDEPENDENT_AMBULATORY_CARE_PROVIDER_SITE_OTHER): Payer: Self-pay | Admitting: Nurse Practitioner

## 2023-11-15 NOTE — Progress Notes (Signed)
 Entered patients room. Family remains at bedside, resting with eyes closed. Respirations 8. Dilaudid infusing as ordered. No needs identified.

## 2023-11-15 NOTE — Progress Notes (Signed)
 Upon rounding, pt was met in presence of family. Pt was observed to be unresponsive with no palpable pulse nor respiration. Pt is DNR and has been on comfort care. Charge nurse was notified, upon re-examination, no heart sounds or breadth sounds were noted after 1 full minute of auscultation. Time of death was recorded at 0745. Dr. Nelson Chimes was notified.  Existing Foley catheter was removed as well as the IV line on the LT forearm and dressing was applied to site, Pt's son expressed he does not have any funeral home information. List of funeral homes was printed and provided to him.  Funeral home selected and contacted by son  Elizabeth Rowland,Elizabeth Rowland. Post mortem care completed and all belongings were handed over to family.

## 2023-11-15 NOTE — Death Summary Note (Signed)
 DEATH SUMMARY   Patient Details  Name: Elizabeth Rowland MRN: [id number] DOB: [birth year] NWG:[id number], [name], [place] Admission/Discharge Information   Admit Date:  08-Nov-2023  Date of Death: Date of Death: Nov 24, 2023  Time of Death: Time of Death: 0745  Length of Stay: 12-15-23   Principle Cause of death: Acute on chronic respiratory failure with progression of interstitial lung disease  Hospital Diagnoses: Principal Problem:   Multifocal pneumonia Active Problems:   Acute on chronic respiratory failure with hypoxia (HCC)   COPD (chronic obstructive pulmonary disease) (HCC)   HLD (hyperlipidemia)   Diabetes mellitus without complication (HCC)   Chronic diastolic CHF (congestive heart failure) (HCC)   Pulmonary embolism (HCC)   DVT (deep venous thrombosis) (HCC)   Myocardial injury   Prolonged QT interval   Anxiety   Obesity (BMI 30-39.9)   Hospital Course: Hospital course / significant events:   HPI: 83 y.o. female with medical history significant of PE and DVT on Eliquis, HTN, HDL, DM, COPD, dCHf, anxiety, obesity, lymphedema, who presents with shortness of breath.  Per report, patient was found to have severe respiratory distress, with oxygen desaturation to 50% on room air, initially started on CPAP with 85% of saturation, then BiPAP started in ED with improvement.   08-Nov-2023: admitted to hospitalist service, needing BiPap support into early 02/09 02/09-02/10: remains on [facility] [place] O2 support  02/11: really not weaning down on high flow, will get CT chest, based on exam suspect may have pleural effusion, eval for PE would be prudent though she has been compliant w/ eliquis she has hx  2/19: Hemodynamically stable, able to wean FiO2 to 85%, remained on 60 L of heated high flow, clinically seems improving and does not want to give up stating that she has been through this multiple times.  Significant leukocytosis at 42.8-patient received very high doses of Solu-Medrol for 3 days,  differential and smear review with mild left shift, 1 to 5% matters, polychromasia and target cells, CRP improved.  If leukocytosis continue to get worse we will involve hematology.  Continuing current level of care. 2/20: Patient was placed back to BiPAP due to worsening respiratory status, CRP started increasing after normalizing initially. Patient is very high risk for mortality but wants to keep going and not ready for comfort care yet. 2/21: Patient remained in respiratory distress, barely saturating in mid 80s on BiPAP with maximum setting.  Repeat RVP was negative, Solu-Medrol dose was increased to 125 mg twice daily by pulmonary yesterday.  CRP at 2.7.  Palliative care again met with her but she wants to keep going 2/22: No change in respiratory status, patient apparently pulled her BiPAP mask momentarily and saturation dropped in 40s resulted in a lot of anxiety and dyspnea, she told nurse that she is tired and wants to be just comfortable now.  She was given a dose of morphine followed by a small dose of Ativan to decrease anxiety.  Lengthy discussion with 2 sons at bedside, apparently she told one of her son yesterday afternoon that she is getting tired.  We have maximized treatment.  Patient has extensive ILD, she was DNR and DNI. She is being transitioned to full comfort care, started on Dilaudid infusion as morphine was not helping her much. Anticipating hospital death. 24-Nov-2023: Patient was transitioned to comfort care yesterday, she was on Dilaudid infusion and maximum setting of heated high flow when she passed peacefully in the presence of family.  Procedures: None  Consultations: Pulmonology  The results of significant diagnostics from this hospitalization (including imaging, microbiology, ancillary and laboratory) are listed below for reference.   Significant Diagnostic Studies: DG Chest Port 1 View Result Date: 11/07/2023 CLINICAL DATA:  Shortness of breath EXAM: PORTABLE CHEST 1  VIEW COMPARISON:  10/24/2023 FINDINGS: Single frontal view of the chest demonstrates diffuse bilateral ground-glass airspace disease, which has progressed since prior exam. No effusion or pneumothorax. Cardiac silhouette is obscured by the widespread airspace disease. No acute bony abnormalities. IMPRESSION: 1. Progressive bilateral airspace disease, consistent with worsening infection or edema. Electronically Signed   By: Sharlet Salina M.D.   On: 11/07/2023 17:51   ECHOCARDIOGRAM COMPLETE Result Date: 10/31/2023    ECHOCARDIOGRAM REPORT   Patient Name:   Elizabeth Rowland Date of Exam: 10/31/2023 Medical Rec #:  [id number]           Height:       64.0 in Accession #:    [id number]          Weight:       216.0 lb Date of Birth:  09-Jul-1941          BSA:          2.022 m Patient Age:    82 years            BP:           118/60 mmHg Patient Gender: F                   HR:           96 bpm. Exam Location:  [facility] Procedure: 2D Echo, Cardiac Doppler and Color Doppler (Both Spectral and Color            Flow Doppler were utilized during procedure). Indications:     Acute Respiratory distress  History:         Patient has no prior history of Echocardiogram examinations.                  CHF, COPD; Risk Factors:Diabetes and Dyslipidemia. Pulmonary                  embolus.  Sonographer:     Mikki Harbor Referring Phys:  [id number] PRINCE T DJAN Diagnosing Phys: Adrian Blackwater  Sonographer Comments: Technically challenging study due to limited acoustic windows, suboptimal parasternal window, suboptimal apical window and suboptimal subcostal window. Image acquisition challenging due to patient behavioral factors., Image acquisition challenging due to COPD and Image acquisition challenging due to respiratory motion. IMPRESSIONS  1. Left ventricular ejection fraction, by estimation, is 55 to 60%. The left ventricle has normal function. The left ventricle has no regional wall motion abnormalities. Left ventricular diastolic  parameters are consistent with Grade I diastolic dysfunction (impaired relaxation). The global longitudinal strain is normal.  2. Right ventricular systolic function is normal. The right ventricular size is normal.  3. Left atrial size was mildly dilated.  4. The mitral valve is normal in structure. Trivial mitral valve regurgitation. No evidence of mitral stenosis.  5. The aortic valve is normal in structure. Aortic valve regurgitation is mild to moderate. Mild aortic valve stenosis.  6. The inferior vena cava is normal in size with greater than 50% respiratory variability, suggesting right atrial pressure of 3 mmHg. FINDINGS  Left Ventricle: Left ventricular ejection fraction, by estimation, is 55 to 60%. The left ventricle has normal function. The left ventricle has no regional wall motion abnormalities. Strain was performed and  the global longitudinal strain is normal. The  left ventricular internal cavity size was normal in size. There is no left ventricular hypertrophy. Left ventricular diastolic parameters are consistent with Grade I diastolic dysfunction (impaired relaxation). Right Ventricle: The right ventricular size is normal. No increase in right ventricular wall thickness. Right ventricular systolic function is normal. Left Atrium: Left atrial size was mildly dilated. Right Atrium: Right atrial size was normal in size. Pericardium: There is no evidence of pericardial effusion. Mitral Valve: The mitral valve is normal in structure. Trivial mitral valve regurgitation. No evidence of mitral valve stenosis. MV peak gradient, 9.1 mmHg. The mean mitral valve gradient is 4.0 mmHg. Tricuspid Valve: The tricuspid valve is normal in structure. Tricuspid valve regurgitation is trivial. No evidence of tricuspid stenosis. Aortic Valve: The aortic valve is normal in structure. Aortic valve regurgitation is mild to moderate. Mild aortic stenosis is present. Aortic valve mean gradient measures 13.0 mmHg. Aortic valve  peak gradient measures 24.3 mmHg. Aortic valve area, by VTI measures 2.08 cm. Pulmonic Valve: The pulmonic valve was normal in structure. Pulmonic valve regurgitation is not visualized. No evidence of pulmonic stenosis. Aorta: The aortic root is normal in size and structure. Venous: The inferior vena cava is normal in size with greater than 50% respiratory variability, suggesting right atrial pressure of 3 mmHg. IAS/Shunts: No atrial level shunt detected by color flow Doppler. Additional Comments: 3D was performed not requiring image post processing on an independent workstation and was normal.  LEFT VENTRICLE PLAX 2D LVIDd:         4.20 cm   Diastology LVIDs:         2.90 cm   LV e' medial:    7.83 cm/s LV PW:         1.40 cm   LV E/e' medial:  14.4 LV IVS:        1.30 cm   LV e' lateral:   8.92 cm/s LVOT diam:     1.90 cm   LV E/e' lateral: 12.7 LV SV:         105 LV SV Index:   52 LVOT Area:     2.84 cm  LEFT ATRIUM           Index LA diam:      3.50 cm 1.73 cm/m LA Vol (A4C): 38.2 ml 18.90 ml/m  AORTIC VALVE                     PULMONIC VALVE AV Area (Vmax):    1.75 cm      PV Vmax:       1.36 m/s AV Area (Vmean):   1.64 cm      PV Peak grad:  7.4 mmHg AV Area (VTI):     2.08 cm AV Vmax:           246.50 cm/s AV Vmean:          166.000 cm/s AV VTI:            0.505 m AV Peak Grad:      24.3 mmHg AV Mean Grad:      13.0 mmHg LVOT Vmax:         152.00 cm/s LVOT Vmean:        95.800 cm/s LVOT VTI:          0.371 m LVOT/AV VTI ratio: 0.73  AORTA Ao Root diam: 3.10 cm Ao Asc diam:  2.60 cm MITRAL VALVE MV Area (  PHT): 3.65 cm     SHUNTS MV Area VTI:   3.09 cm     Systemic VTI:  0.37 m MV Peak grad:  9.1 mmHg     Systemic Diam: 1.90 cm MV Mean grad:  4.0 mmHg MV Vmax:       1.51 m/s MV Vmean:      98.4 cm/s MV Decel Time: 208 msec MV E velocity: 113.00 cm/s MV A velocity: 131.00 cm/s MV E/A ratio:  0.86 Shaukat Khan Electronically signed by Adrian Blackwater Signature Date/Time: 10/31/2023/4:13:55 PM    Final     CT CHEST W CONTRAST Result Date: 10/28/2023 CLINICAL DATA:  Pneumonia, complication suspected, xray done Diffuse/interstitial lung disease EXAM: CT CHEST WITH CONTRAST TECHNIQUE: Multidetector CT imaging of the chest was performed during intravenous contrast administration. RADIATION DOSE REDUCTION: This exam was performed according to the departmental dose-optimization program which includes automated exposure control, adjustment of the mA and/or kV according to patient size and/or use of iterative reconstruction technique. CONTRAST:  75mL OMNIPAQUE IOHEXOL 300 MG/ML  SOLN COMPARISON:  X-ray 10/24/2023, CT 11/29/2021 FINDINGS: Cardiovascular: Mild cardiomegaly. Trace pericardial fluid. Lipomatous hypertrophy of the interatrial septum. Thoracic aorta is nonaneurysmal. Atherosclerotic calcifications of the aorta and coronary arteries. Central pulmonary vasculature is mildly dilated. Mediastinum/Nodes: Multiple enlarged mediastinal and right hilar lymph nodes. Reference nodes include 1.6 cm lower right paratracheal node (series 2, image 49), 1.4 cm AP window node (series 2, image 53), 1.4 cm right hilar node (series 2, image 63). These nodes have all increased in size when compared to the prior CT from 2023. Several small left hilar lymph nodes are present. No axillary lymphadenopathy. 2.3 cm left thyroid lobe nodule. Trachea and esophagus within normal limits. Small hiatal hernia. Lungs/Pleura: Extensive airspace opacity throughout both lungs involving all lobes, findings are slightly worse within the right lung. Background of interstitial lung disease, not well assessed given the degree of airspace consolidation. No pleural effusion or pneumothorax. Upper Abdomen: No acute abnormality. Musculoskeletal: No chest wall abnormality. No acute or significant osseous findings. IMPRESSION: 1. Extensive airspace opacity throughout both lungs, slightly worse within the right lung. Findings are most suspicious for  multifocal pneumonia. 2. Background of interstitial lung disease, not well assessed given the degree of airspace consolidation. Consider follow-up high-resolution chest CT on an outpatient basis following the resolution of patient's acute symptoms. 3. Mediastinal and right hilar lymphadenopathy, increased in size when compared to the prior CT from 2023. Findings are favored to be reactive. 4. Mild cardiomegaly with mild dilation of the central pulmonary vasculature, suggesting pulmonary arterial hypertension. 5. Aortic and coronary artery atherosclerosis (ICD10-I70.0). 6. Incidentally noted 2.3 cm left thyroid lobe nodule. Recommend thyroid US (ref: J Am Coll Radiol. 2015 Feb;12(2): 143-50). Electronically Signed   By: Duanne Guess D.O.   On: 10/28/2023 19:56   DG Chest Portable 1 View Result Date: 10/24/2023 CLINICAL DATA:  Dyspnea EXAM: PORTABLE CHEST 1 VIEW COMPARISON:  Chest x-ray 10/19/2018, report only. FINDINGS: Patient is rotated. The cardiomediastinal silhouette is grossly within normal limits. There are diffuse bilateral airspace opacities throughout the mid and lower lung sparing the lung apices. There is more dense consolidation in the right lower lobe. There is no pleural effusion or pneumothorax. No acute fractures are seen. IMPRESSION: Diffuse bilateral airspace opacities throughout the mid and lower lung sparing the lung apices. There is more dense consolidation in the right lower lobe. Findings are concerning for multifocal pneumonia. Electronically Signed   By: Mcneil Sober.D.  On: 10/24/2023 16:54    Microbiology: Recent Results (from the past 240 hours)  MRSA Next Gen by PCR, Nasal     Status: None   Collection Time: 11/01/23  2:25 PM   Specimen: Nasal Mucosa; Nasal Swab  Result Value Ref Range Status   MRSA by PCR Next Gen NOT DETECTED NOT DETECTED Final    Comment: (NOTE) The GeneXpert MRSA Assay (FDA approved for NASAL specimens only), is one component of a comprehensive  MRSA colonization surveillance program. It is not intended to diagnose MRSA infection nor to guide or monitor treatment for MRSA infections. Test performance is not FDA approved in patients less than 26 years old. Performed at [facility], [address] [place], [place] [address]   Respiratory (~20 pathogens) panel by PCR     Status: None   Collection Time: 11/06/23  3:58 PM   Specimen: Nasopharyngeal Swab; Respiratory  Result Value Ref Range Status   Adenovirus NOT DETECTED NOT DETECTED Final   Coronavirus 229E NOT DETECTED NOT DETECTED Final    Comment: (NOTE) The Coronavirus on the Respiratory Panel, DOES NOT test for the novel  Coronavirus (2019 nCoV)    Coronavirus HKU1 NOT DETECTED NOT DETECTED Final   Coronavirus NL63 NOT DETECTED NOT DETECTED Final   Coronavirus OC43 NOT DETECTED NOT DETECTED Final   Metapneumovirus NOT DETECTED NOT DETECTED Final   Rhinovirus / Enterovirus NOT DETECTED NOT DETECTED Final   Influenza A NOT DETECTED NOT DETECTED Final   Influenza B NOT DETECTED NOT DETECTED Final   Parainfluenza Virus 1 NOT DETECTED NOT DETECTED Final   Parainfluenza Virus 2 NOT DETECTED NOT DETECTED Final   Parainfluenza Virus 3 NOT DETECTED NOT DETECTED Final   Parainfluenza Virus 4 NOT DETECTED NOT DETECTED Final   Respiratory Syncytial Virus NOT DETECTED NOT DETECTED Final   Bordetella pertussis NOT DETECTED NOT DETECTED Final   Bordetella Parapertussis NOT DETECTED NOT DETECTED Final   Chlamydophila pneumoniae NOT DETECTED NOT DETECTED Final   Mycoplasma pneumoniae NOT DETECTED NOT DETECTED Final    Comment: Performed at [facility], [address], [place], [place] [address]    Time spent: 30 minutes  Signed: Arnetha Courser, MD 10/21/2023

## 2023-11-15 NOTE — Plan of Care (Signed)
 PMT following for symptom management. In to see patient to assess needs. She has already expired. No family at bedside. PMT will sign off.

## 2023-11-15 NOTE — Plan of Care (Signed)
   Problem: Coping: Goal: Ability to adjust to condition or change in health will improve Outcome: Progressing

## 2023-11-15 DEATH — deceased

## 2023-12-23 ENCOUNTER — Ambulatory Visit (INDEPENDENT_AMBULATORY_CARE_PROVIDER_SITE_OTHER): Payer: 59 | Admitting: Nurse Practitioner
